# Patient Record
Sex: Male | Born: 1955
Health system: Southern US, Community
[De-identification: ages and names within clinical notes are randomized; demographics above are authoritative.]

## PROBLEM LIST (undated history)

## (undated) DIAGNOSIS — C801 Malignant (primary) neoplasm, unspecified: Secondary | ICD-10-CM

## (undated) DIAGNOSIS — H269 Unspecified cataract: Secondary | ICD-10-CM

## (undated) DIAGNOSIS — M810 Age-related osteoporosis without current pathological fracture: Secondary | ICD-10-CM

## (undated) DIAGNOSIS — K219 Gastro-esophageal reflux disease without esophagitis: Secondary | ICD-10-CM

## (undated) DIAGNOSIS — E785 Hyperlipidemia, unspecified: Secondary | ICD-10-CM

## (undated) DIAGNOSIS — E1165 Type 2 diabetes mellitus with hyperglycemia: Secondary | ICD-10-CM

## (undated) DIAGNOSIS — G709 Myoneural disorder, unspecified: Secondary | ICD-10-CM

## (undated) DIAGNOSIS — Z8711 Personal history of peptic ulcer disease: Secondary | ICD-10-CM

## (undated) DIAGNOSIS — I509 Heart failure, unspecified: Secondary | ICD-10-CM

## (undated) DIAGNOSIS — IMO0002 Reserved for concepts with insufficient information to code with codable children: Secondary | ICD-10-CM

## (undated) DIAGNOSIS — I10 Essential (primary) hypertension: Secondary | ICD-10-CM

## (undated) DIAGNOSIS — Z8719 Personal history of other diseases of the digestive system: Secondary | ICD-10-CM

## (undated) DIAGNOSIS — M1990 Unspecified osteoarthritis, unspecified site: Secondary | ICD-10-CM

## (undated) DIAGNOSIS — C439 Malignant melanoma of skin, unspecified: Secondary | ICD-10-CM

## (undated) DIAGNOSIS — E1140 Type 2 diabetes mellitus with diabetic neuropathy, unspecified: Secondary | ICD-10-CM

## (undated) DIAGNOSIS — T7840XA Allergy, unspecified, initial encounter: Secondary | ICD-10-CM

## (undated) DIAGNOSIS — D649 Anemia, unspecified: Secondary | ICD-10-CM

## (undated) HISTORY — DX: Malignant melanoma of skin, unspecified: C43.9

## (undated) HISTORY — PX: TONSILLECTOMY: SUR1361

## (undated) HISTORY — DX: Unspecified osteoarthritis, unspecified site: M19.90

## (undated) HISTORY — PX: ABCESS DRAINAGE: SHX399

## (undated) HISTORY — DX: Allergy, unspecified, initial encounter: T78.40XA

## (undated) HISTORY — DX: Age-related osteoporosis without current pathological fracture: M81.0

## (undated) HISTORY — DX: Hyperlipidemia, unspecified: E78.5

## (undated) HISTORY — DX: Heart failure, unspecified: I50.9

## (undated) HISTORY — DX: Myoneural disorder, unspecified: G70.9

## (undated) HISTORY — PX: OTHER SURGICAL HISTORY: SHX169

## (undated) HISTORY — DX: Unspecified cataract: H26.9

## (undated) HISTORY — DX: Anemia, unspecified: D64.9

## (undated) HISTORY — PX: COLONOSCOPY: SHX174

## (undated) HISTORY — DX: Gastro-esophageal reflux disease without esophagitis: K21.9

---

## 2011-12-05 ENCOUNTER — Encounter (HOSPITAL_BASED_OUTPATIENT_CLINIC_OR_DEPARTMENT_OTHER): Payer: Self-pay | Admitting: *Deleted

## 2011-12-05 ENCOUNTER — Emergency Department (HOSPITAL_BASED_OUTPATIENT_CLINIC_OR_DEPARTMENT_OTHER)
Admission: EM | Admit: 2011-12-05 | Discharge: 2011-12-05 | Disposition: A | Discharge status: Other Acute Inpatient Hospital | Payer: Self-pay | Attending: Emergency Medicine | Admitting: Emergency Medicine

## 2011-12-05 DIAGNOSIS — K612 Anorectal abscess: Secondary | ICD-10-CM | POA: Insufficient documentation

## 2011-12-05 DIAGNOSIS — E1110 Type 2 diabetes mellitus with ketoacidosis without coma: Secondary | ICD-10-CM | POA: Insufficient documentation

## 2011-12-05 DIAGNOSIS — K610 Anal abscess: Secondary | ICD-10-CM

## 2011-12-05 DIAGNOSIS — R000 Tachycardia, unspecified: Secondary | ICD-10-CM | POA: Insufficient documentation

## 2011-12-05 HISTORY — DX: Essential (primary) hypertension: I10

## 2011-12-05 LAB — URINALYSIS, ROUTINE W REFLEX MICROSCOPIC
Glucose, UA: >1000 mg/dL — AB
Ketones, ur: >80 mg/dL — AB
Leukocytes, UA: NEGATIVE
Nitrite: NEGATIVE
Protein, ur: 30 mg/dL — AB
Urobilinogen, UA: 0.2 mg/dL (ref 0.0–1.0)

## 2011-12-05 LAB — BASIC METABOLIC PANEL
Calcium: 9.9 mg/dL (ref 8.4–10.5)
Creatinine, Ser: 0.8 mg/dL (ref 0.50–1.35)
GFR calc Af Amer: >90 mL/min (ref >90)
GFR calc non Af Amer: >90 mL/min (ref >90)
Sodium: 134 mEq/L — ABNORMAL LOW (ref 135–145)

## 2011-12-05 LAB — GLUCOSE, CAPILLARY: Glucose-Capillary: 348 mg/dL — ABNORMAL HIGH (ref 70–99)

## 2011-12-05 LAB — POCT I-STAT 3, VENOUS BLOOD GAS (G3P V)
Acid-base deficit: 14 mmol/L — ABNORMAL HIGH (ref 0.0–2.0)
O2 Saturation: 76 %
pCO2, Ven: 23.8 mmHg — ABNORMAL LOW (ref 45.0–50.0)
pO2, Ven: 45 mmHg (ref 30.0–45.0)

## 2011-12-05 LAB — CBC WITH DIFFERENTIAL/PLATELET
Basophils Absolute: 0 10*3/uL (ref 0.0–0.1)
Basophils Relative: 0 % (ref 0–1)
Lymphocytes Relative: 10 % — ABNORMAL LOW (ref 12–46)
MCHC: 36.5 g/dL — ABNORMAL HIGH (ref 30.0–36.0)
Monocytes Absolute: 0.9 10*3/uL (ref 0.1–1.0)
Neutro Abs: 6.9 10*3/uL (ref 1.7–7.7)
Platelets: 150 10*3/uL (ref 150–400)
RDW: 13.2 % (ref 11.5–15.5)
WBC: 8.7 10*3/uL (ref 4.0–10.5)

## 2011-12-05 LAB — URINE MICROSCOPIC-ADD ON

## 2011-12-05 LAB — KETONES, QUALITATIVE

## 2011-12-05 MED ORDER — MORPHINE SULFATE 4 MG/ML IJ SOLN
4.0000 mg | Freq: Once | INTRAMUSCULAR | Status: AC
  Administered 2011-12-05: 4 mg via INTRAVENOUS
  Filled 2011-12-05: qty 1

## 2011-12-05 MED ORDER — SODIUM CHLORIDE 0.9 % IV SOLN: 1000.0000 mL | INTRAVENOUS | Status: DC

## 2011-12-05 MED ORDER — SODIUM CHLORIDE 0.9 % IV BOLUS (SEPSIS): 1000.0000 mL | Freq: Once | INTRAVENOUS | Status: DC

## 2011-12-05 MED ORDER — SODIUM CHLORIDE 0.9 % IV SOLN
Freq: Once | INTRAVENOUS | Status: AC
  Administered 2011-12-05: 20:00:00 via INTRAVENOUS

## 2011-12-05 MED ORDER — DEXTROSE 50 % IV SOLN: 25.0000 mL | INTRAVENOUS | Status: DC | PRN

## 2011-12-05 MED ORDER — INSULIN REGULAR HUMAN 100 UNIT/ML IJ SOLN
INTRAMUSCULAR | Status: AC
  Filled 2011-12-05: qty 1

## 2011-12-05 MED ORDER — SODIUM CHLORIDE 0.9 % IV SOLN: INTRAVENOUS | Status: DC

## 2011-12-05 MED ORDER — DEXTROSE 5 % IV SOLN: INTRAVENOUS | Status: DC

## 2011-12-05 MED ORDER — DEXTROSE-NACL 5-0.45 % IV SOLN
INTRAVENOUS | Status: DC
  Administered 2011-12-05: 20:00:00 via INTRAVENOUS

## 2011-12-05 MED ORDER — ONDANSETRON HCL 4 MG/2ML IJ SOLN
4.0000 mg | Freq: Once | INTRAMUSCULAR | Status: AC
  Administered 2011-12-05: 4 mg via INTRAVENOUS
  Filled 2011-12-05: qty 2

## 2011-12-05 MED ORDER — ONDANSETRON HCL 4 MG/2ML IJ SOLN
4.0000 mg | Freq: Once | INTRAMUSCULAR | Status: AC
  Administered 2011-12-05: 4 mg via INTRAVENOUS

## 2011-12-05 MED ORDER — INSULIN REGULAR BOLUS VIA INFUSION
0.0000 [IU] | Freq: Three times a day (TID) | INTRAVENOUS | Status: DC
  Filled 2011-12-05: qty 10

## 2011-12-05 MED ORDER — MORPHINE SULFATE 4 MG/ML IJ SOLN: 4.0000 mg | Freq: Once | INTRAMUSCULAR | Status: DC

## 2011-12-05 MED ORDER — SODIUM CHLORIDE 0.9 % IV BOLUS (SEPSIS)
1000.0000 mL | Freq: Once | INTRAVENOUS | Status: AC
  Administered 2011-12-05: 1000 mL via INTRAVENOUS

## 2011-12-05 NOTE — ED Notes (Signed)
Report called to [facility] with Lorin Picket, RN

## 2011-12-05 NOTE — ED Notes (Signed)
Rate in Insulin drip changed from 2.4 u/hr to 1.6 u/hr

## 2011-12-05 NOTE — ED Provider Notes (Signed)
History     CSN: [id number]  Arrival date & time 12/05/11  1528   First MD Initiated Contact with Patient 12/05/11 1538      Chief Complaint  Patient presents with  . Abscess    (Consider location/radiation/quality/duration/timing/severity/associated sxs/prior treatment) HPI Patient is a 56 year old male with history of type 2 diabetes who presents today complaining of 10 out of 10 rectal pain. Patient felt that this is hemorrhoids as he has a history of this in the past. Patient noted pain 2 days ago. He tried suppositories at home for this without relief. He's had increasing worsening and swelling in the rectal area. He denies any blood in his stools, abdominal pain, vomiting, or fevers. Patient does not check his blood sugars and notes that he has not taken his metformin for 6 months since the death of his parents. Patient denies any chest pain, cough, or shortness of breath. He was noted to be hypertensive with blood pressure 183/100 on presentation. Patient has not been treated for blood pressure in the past. He was tachycardic as well but afebrile. Patient reported pain was worse with sitting and better with lying flat on his stomach. He tried over-the-counter medications without relief. Patient reported that he has had one area drained in the past but this was not the location where he has pain today. He had obvious fluctuant area noted at the 12:00 position with patient in prone position. Family. There is no significant erythema. There no other associated or modifying factors. Past Medical History  Diagnosis Date  . Hypertension     History reviewed. No pertinent past surgical history.  History reviewed. No pertinent family history.  History  Substance Use Topics  . Smoking status: Never Smoker   . Smokeless tobacco: Not on file  . Alcohol Use: No      Review of Systems  Constitutional: Negative.   HENT: Negative.   Eyes: Negative.   Respiratory: Negative.     Cardiovascular: Negative.   Gastrointestinal: Positive for nausea and rectal pain. Negative for blood in stool.  Genitourinary: Positive for decreased urine volume.  Musculoskeletal: Negative.   Skin: Negative.   Neurological: Negative.   Hematological: Negative.   Psychiatric/Behavioral: Negative.   All other systems reviewed and are negative.    Allergies  Review of patient's allergies indicates no known allergies.  Home Medications   Current Outpatient Rx  Name Route Sig Dispense Refill  . IBUPROFEN 200 MG PO TABS Oral Take 600 mg by mouth every 6 (six) hours as needed. For pain.    Marland Kitchen NAPROXEN SODIUM 220 MG PO TABS Oral Take 220 mg by mouth 2 (two) times daily with a meal. For pain.    Marland Kitchen RANITIDINE HCL 150 MG PO TABS Oral Take 150 mg by mouth 2 (two) times daily.      BP 183/100  Pulse 110  Temp 98.6 F (37 C) (Oral)  Resp 16  Ht 5\' 11"  (1.803 m)  Wt 185 lb (83.915 kg)  BMI 25.80 kg/m2  SpO2 100%  Physical Exam  Nursing note and vitals reviewed. GEN: Well-developed, well-nourished male in no distress, very uncomfortable, smells of ketones HEENT: Atraumatic, normocephalic. Oropharynx clear without erythema EYES: PERRLA BL, no scleral icterus. NECK: Trachea midline, no meningismus CV: Tachycardic with regular rhythm. No murmurs, rubs, or gallops PULM: No respiratory distress.  No crackles, wheezes, or rales. GI: soft, non-tender. No guarding, rebound, or tenderness. + bowel sounds. Rectal exam with patient in the prone position shows  area that is approximately 1 inch in diameter that is fluctuant and tender to the touch. There is no overlying erythema or extension to the perineal region.  GU: deferred Neuro: cranial nerves grossly 2-12 intact, no abnormalities of strength or sensation, A and O x 3 MSK: Patient moves all 4 extremities symmetrically, no deformity, edema, or injury noted Skin: No rashes petechiae, purpura, or jaundice Psych: no abnormality of  mood   ED Course  Procedures (including critical care time)  Labs Reviewed  URINALYSIS, ROUTINE W REFLEX MICROSCOPIC - Abnormal; Notable for the following:    Specific Gravity, Urine 1.039 (*)     Glucose, UA >1000 (*)     Bilirubin Urine SMALL (*)     Ketones, ur >80 (*)     Protein, ur 30 (*)     All other components within normal limits  BASIC METABOLIC PANEL - Abnormal; Notable for the following:    Sodium 134 (*)     CO2 11 (*)     Glucose, Bld 347 (*)     All other components within normal limits  KETONES, QUALITATIVE - Abnormal; Notable for the following:    Acetone, Bld MODERATE (*)     All other components within normal limits  CBC WITH DIFFERENTIAL - Abnormal; Notable for the following:    HCT 38.4 (*)     MCHC 36.5 (*)     Neutrophils Relative 79 (*)     Lymphocytes Relative 10 (*)     All other components within normal limits  GLUCOSE, CAPILLARY - Abnormal; Notable for the following:    Glucose-Capillary 348 (*)     All other components within normal limits  POCT I-STAT 3, BLOOD GAS (G3P V) - Abnormal; Notable for the following:    pCO2, Ven 23.8 (*)     Bicarbonate 11.2 (*)     Acid-base deficit 14.0 (*)     All other components within normal limits  GLUCOSE, CAPILLARY - Abnormal; Notable for the following:    Glucose-Capillary 215 (*)     All other components within normal limits  URINE MICROSCOPIC-ADD ON  BLOOD GAS, VENOUS   No results found.   1. DKA (diabetic ketoacidoses)   2. Perianal abscess       MDM   Patient was evaluated by myself. Based on Aroma of ketones I had concern for patient being hyperglycemic. At this point is that the patient admitted to not taking his medicines for several months. He did also have obvious signs of infection with abscess. CBG was noted to be 348. Patient had serum ketones are positive as well as a gap of 27. A VBG showed a pH of 7.279 with a bicarbonate of 11.2 and a base deficit of 14. Patient received IV fluids  and was started on glucose stabilizer. Urinalysis showed ketones but no signs of infection. Patient was treated for his pain with 2 doses of morphine. Given location of patient's abscess I did feel that drainage by surgery was appropriate. Patient did not have overlying erythema that would necessitate antibiotics for cellulitis. Patient's white count was 8.7 here. Patient was accepted for admission by [facility] regional. Patient has no primary care physician but preferred admission to this facility given that he lives at [place]. Patient was transferred in good condition.  CRITICAL CARE Performed by: Cyndra Numbers   Total critical care time: 45 minutes  Critical care time was exclusive of separately billable procedures and treating other patients.  Critical care  was necessary to treat or prevent imminent or life-threatening deterioration.  Critical care was time spent personally by me on the following activities: development of treatment plan with patient and/or surrogate as well as nursing, discussions with consultants, evaluation of patient's response to treatment, examination of patient, obtaining history from patient or surrogate, ordering and performing treatments and interventions, ordering and review of laboratory studies, ordering and review of radiographic studies, pulse oximetry and re-evaluation of patient's condition.         Cyndra Numbers, MD 12/05/11 1901

## 2011-12-05 NOTE — ED Notes (Signed)
Pt c/o Hemorid x 2 days

## 2015-08-15 ENCOUNTER — Observation Stay (HOSPITAL_BASED_OUTPATIENT_CLINIC_OR_DEPARTMENT_OTHER)
Admission: EM | Admit: 2015-08-15 | Discharge: 2015-08-16 | Disposition: A | Discharge status: Home / Self Care | Payer: PRIVATE HEALTH INSURANCE | Attending: Surgery | Admitting: Surgery

## 2015-08-15 ENCOUNTER — Emergency Department (HOSPITAL_COMMUNITY): Payer: PRIVATE HEALTH INSURANCE | Admitting: Anesthesiology

## 2015-08-15 ENCOUNTER — Emergency Department (HOSPITAL_BASED_OUTPATIENT_CLINIC_OR_DEPARTMENT_OTHER): Payer: PRIVATE HEALTH INSURANCE

## 2015-08-15 ENCOUNTER — Encounter (HOSPITAL_BASED_OUTPATIENT_CLINIC_OR_DEPARTMENT_OTHER): Payer: Self-pay | Admitting: *Deleted

## 2015-08-15 ENCOUNTER — Encounter (HOSPITAL_COMMUNITY)
Admission: EM | Disposition: A | Discharge status: Home / Self Care | Payer: Self-pay | Source: Home / Self Care | Attending: Emergency Medicine

## 2015-08-15 DIAGNOSIS — Z791 Long term (current) use of non-steroidal anti-inflammatories (NSAID): Secondary | ICD-10-CM | POA: Diagnosis not present

## 2015-08-15 DIAGNOSIS — Z8719 Personal history of other diseases of the digestive system: Secondary | ICD-10-CM | POA: Insufficient documentation

## 2015-08-15 DIAGNOSIS — Z79899 Other long term (current) drug therapy: Secondary | ICD-10-CM | POA: Diagnosis not present

## 2015-08-15 DIAGNOSIS — K259 Gastric ulcer, unspecified as acute or chronic, without hemorrhage or perforation: Secondary | ICD-10-CM | POA: Insufficient documentation

## 2015-08-15 DIAGNOSIS — R110 Nausea: Secondary | ICD-10-CM | POA: Insufficient documentation

## 2015-08-15 DIAGNOSIS — E119 Type 2 diabetes mellitus without complications: Secondary | ICD-10-CM | POA: Diagnosis not present

## 2015-08-15 DIAGNOSIS — I10 Essential (primary) hypertension: Secondary | ICD-10-CM | POA: Diagnosis not present

## 2015-08-15 DIAGNOSIS — Z7984 Long term (current) use of oral hypoglycemic drugs: Secondary | ICD-10-CM | POA: Insufficient documentation

## 2015-08-15 DIAGNOSIS — K4030 Unilateral inguinal hernia, with obstruction, without gangrene, not specified as recurrent: Principal | ICD-10-CM | POA: Insufficient documentation

## 2015-08-15 HISTORY — DX: Personal history of peptic ulcer disease: Z87.11

## 2015-08-15 HISTORY — DX: Personal history of other diseases of the digestive system: Z87.19

## 2015-08-15 HISTORY — PX: INGUINAL HERNIA REPAIR: SHX194

## 2015-08-15 LAB — CBC WITH DIFFERENTIAL/PLATELET
Basophils Absolute: 0 10*3/uL (ref 0.0–0.1)
Basophils Relative: 0 %
EOS ABS: 0.1 10*3/uL (ref 0.0–0.7)
EOS PCT: 1 %
HCT: 34.7 % — ABNORMAL LOW (ref 39.0–52.0)
Hemoglobin: 13 g/dL (ref 13.0–17.0)
LYMPHS ABS: 1.7 10*3/uL (ref 0.7–4.0)
Lymphocytes Relative: 32 %
MCH: 32.7 pg (ref 26.0–34.0)
MCHC: 37.5 g/dL — AB (ref 30.0–36.0)
MCV: 87.2 fL (ref 78.0–100.0)
MONO ABS: 0.4 10*3/uL (ref 0.1–1.0)
MONOS PCT: 8 %
Neutro Abs: 3.2 10*3/uL (ref 1.7–7.7)
Neutrophils Relative %: 59 %
PLATELETS: 147 10*3/uL — AB (ref 150–400)
RBC: 3.98 MIL/uL — ABNORMAL LOW (ref 4.22–5.81)
RDW: 12.4 % (ref 11.5–15.5)
WBC: 5.5 10*3/uL (ref 4.0–10.5)

## 2015-08-15 LAB — BASIC METABOLIC PANEL
Anion gap: 8 (ref 5–15)
BUN: 10 mg/dL (ref 6–20)
CHLORIDE: 101 mmol/L (ref 101–111)
CO2: 21 mmol/L — AB (ref 22–32)
CREATININE: 0.59 mg/dL — AB (ref 0.61–1.24)
Calcium: 9 mg/dL (ref 8.9–10.3)
GFR calc Af Amer: >60 mL/min (ref >60)
GFR calc non Af Amer: >60 mL/min (ref >60)
Glucose, Bld: 266 mg/dL — ABNORMAL HIGH (ref 65–99)
Potassium: 3.7 mmol/L (ref 3.5–5.1)
Sodium: 130 mmol/L — ABNORMAL LOW (ref 135–145)

## 2015-08-15 SURGERY — REPAIR, HERNIA, INGUINAL, INCARCERATED
Anesthesia: General | Site: Abdomen

## 2015-08-15 MED ORDER — CEFAZOLIN SODIUM-DEXTROSE 2-4 GM/100ML-% IV SOLN
INTRAVENOUS | Status: AC
Start: 2015-08-15 — End: 2015-08-15
  Filled 2015-08-15: qty 100

## 2015-08-15 MED ORDER — PROPOFOL 10 MG/ML IV BOLUS
INTRAVENOUS | Status: DC | PRN
  Administered 2015-08-15: 150 mg via INTRAVENOUS

## 2015-08-15 MED ORDER — HYDROMORPHONE HCL 1 MG/ML IJ SOLN
INTRAMUSCULAR | Status: DC | PRN
  Administered 2015-08-15 – 2015-08-16 (×3): 0.5 mg via INTRAVENOUS

## 2015-08-15 MED ORDER — MORPHINE SULFATE (PF) 4 MG/ML IV SOLN
4.0000 mg | Freq: Once | INTRAVENOUS | Status: AC
  Administered 2015-08-15: 4 mg via INTRAVENOUS

## 2015-08-15 MED ORDER — LIDOCAINE HCL (CARDIAC) 20 MG/ML IV SOLN
INTRAVENOUS | Status: DC | PRN
  Administered 2015-08-15: 100 mg via INTRAVENOUS

## 2015-08-15 MED ORDER — SODIUM CHLORIDE 0.9 % IV BOLUS (SEPSIS)
500.0000 mL | Freq: Once | INTRAVENOUS | Status: AC
  Administered 2015-08-15: 500 mL via INTRAVENOUS

## 2015-08-15 MED ORDER — HYDROMORPHONE HCL 2 MG/ML IJ SOLN
INTRAMUSCULAR | Status: AC
  Filled 2015-08-15: qty 1

## 2015-08-15 MED ORDER — LACTATED RINGERS IV SOLN
INTRAVENOUS | Status: DC | PRN
  Administered 2015-08-15 – 2015-08-16 (×2): via INTRAVENOUS

## 2015-08-15 MED ORDER — ONDANSETRON HCL 4 MG/2ML IJ SOLN
INTRAMUSCULAR | Status: AC
  Filled 2015-08-15: qty 2

## 2015-08-15 MED ORDER — SUCCINYLCHOLINE CHLORIDE 20 MG/ML IJ SOLN
INTRAMUSCULAR | Status: DC | PRN
  Administered 2015-08-15: 80 mg via INTRAVENOUS

## 2015-08-15 MED ORDER — PHENYLEPHRINE 40 MCG/ML (10ML) SYRINGE FOR IV PUSH (FOR BLOOD PRESSURE SUPPORT)
PREFILLED_SYRINGE | INTRAVENOUS | Status: AC
  Filled 2015-08-15: qty 10

## 2015-08-15 MED ORDER — LIDOCAINE HCL (CARDIAC) 20 MG/ML IV SOLN
INTRAVENOUS | Status: AC
  Filled 2015-08-15: qty 5

## 2015-08-15 MED ORDER — PROPOFOL 10 MG/ML IV BOLUS
INTRAVENOUS | Status: AC
  Filled 2015-08-15: qty 20

## 2015-08-15 MED ORDER — MORPHINE SULFATE (PF) 4 MG/ML IV SOLN
INTRAVENOUS | Status: AC
  Filled 2015-08-15: qty 1

## 2015-08-15 MED ORDER — BUPIVACAINE-EPINEPHRINE (PF) 0.25% -1:200000 IJ SOLN
INTRAMUSCULAR | Status: AC
  Filled 2015-08-15: qty 30

## 2015-08-15 MED ORDER — PHENYLEPHRINE HCL 10 MG/ML IJ SOLN
INTRAMUSCULAR | Status: DC | PRN
  Administered 2015-08-15 – 2015-08-16 (×4): 80 ug via INTRAVENOUS

## 2015-08-15 MED ORDER — FENTANYL CITRATE (PF) 100 MCG/2ML IJ SOLN
INTRAMUSCULAR | Status: DC | PRN
  Administered 2015-08-15 (×2): 50 ug via INTRAVENOUS

## 2015-08-15 MED ORDER — ROCURONIUM BROMIDE 100 MG/10ML IV SOLN
INTRAVENOUS | Status: DC | PRN
  Administered 2015-08-15: 20 mg via INTRAVENOUS

## 2015-08-15 MED ORDER — FENTANYL CITRATE (PF) 100 MCG/2ML IJ SOLN
INTRAMUSCULAR | Status: AC
  Filled 2015-08-15: qty 2

## 2015-08-15 MED ORDER — MORPHINE SULFATE (PF) 4 MG/ML IV SOLN
4.0000 mg | Freq: Once | INTRAVENOUS | Status: AC
  Administered 2015-08-15: 4 mg via INTRAVENOUS
  Filled 2015-08-15: qty 1

## 2015-08-15 MED ORDER — CEFAZOLIN SODIUM-DEXTROSE 2-3 GM-% IV SOLR
2.0000 g | Freq: Once | INTRAVENOUS | Status: AC
  Administered 2015-08-15: 2 g via INTRAVENOUS

## 2015-08-15 MED ORDER — MORPHINE SULFATE (PF) 4 MG/ML IV SOLN: 4.0000 mg | Freq: Once | INTRAVENOUS | Status: DC

## 2015-08-15 SURGICAL SUPPLY — 41 items
BENZOIN TINCTURE PRP APPL 2/3 (GAUZE/BANDAGES/DRESSINGS) ×2 IMPLANT
BLADE HEX COATED 2.75 (ELECTRODE) IMPLANT
BLADE SURG 15 STRL LF DISP TIS (BLADE) IMPLANT
BLADE SURG 15 STRL SS (BLADE)
BLADE SURG SZ10 CARB STEEL (BLADE) ×2 IMPLANT
COVER SURGICAL LIGHT HANDLE (MISCELLANEOUS) IMPLANT
DECANTER SPIKE VIAL GLASS SM (MISCELLANEOUS) ×2 IMPLANT
DISSECTOR ROUND CHERRY 3/8 STR (MISCELLANEOUS) IMPLANT
DRAIN PENROSE 18X1/2 LTX STRL (DRAIN) ×2 IMPLANT
DRAPE LAPAROTOMY TRNSV 102X78 (DRAPE) ×2 IMPLANT
ELECT PENCIL ROCKER SW 15FT (MISCELLANEOUS) ×2 IMPLANT
ELECT REM PT RETURN 9FT ADLT (ELECTROSURGICAL) ×2
ELECTRODE REM PT RTRN 9FT ADLT (ELECTROSURGICAL) ×1 IMPLANT
GAUZE SPONGE 4X4 12PLY STRL (GAUZE/BANDAGES/DRESSINGS) IMPLANT
GLOVE BIOGEL PI IND STRL 7.0 (GLOVE) ×1 IMPLANT
GLOVE BIOGEL PI INDICATOR 7.0 (GLOVE) ×1
GLOVE SURG SIGNA 7.5 PF LTX (GLOVE) ×2 IMPLANT
GOWN SPEC L4 XLG W/TWL (GOWN DISPOSABLE) ×2 IMPLANT
GOWN STRL REUS W/ TWL XL LVL3 (GOWN DISPOSABLE) ×3 IMPLANT
GOWN STRL REUS W/TWL LRG LVL3 (GOWN DISPOSABLE) ×2 IMPLANT
GOWN STRL REUS W/TWL XL LVL3 (GOWN DISPOSABLE) ×3
KIT BASIN OR (CUSTOM PROCEDURE TRAY) ×2 IMPLANT
LIQUID BAND (GAUZE/BANDAGES/DRESSINGS) ×2 IMPLANT
MESH ULTRAPRO 3X6 7.6X15CM (Mesh General) ×2 IMPLANT
NEEDLE HYPO 25X1 1.5 SAFETY (NEEDLE) IMPLANT
NS IRRIG 1000ML POUR BTL (IV SOLUTION) ×2 IMPLANT
PACK BASIC VI WITH GOWN DISP (CUSTOM PROCEDURE TRAY) ×2 IMPLANT
SPONGE LAP 18X18 X RAY DECT (DISPOSABLE) IMPLANT
SPONGE LAP 4X18 X RAY DECT (DISPOSABLE) IMPLANT
STRIP CLOSURE SKIN 1/2X4 (GAUZE/BANDAGES/DRESSINGS) IMPLANT
SUT CHROMIC 0 SH (SUTURE) IMPLANT
SUT MNCRL AB 4-0 PS2 18 (SUTURE) ×2 IMPLANT
SUT NOVA 0 T19/GS 22DT (SUTURE) ×8 IMPLANT
SUT VIC AB 0 CT2 27 (SUTURE) ×2 IMPLANT
SUT VIC AB 2-0 SH 27 (SUTURE) ×1
SUT VIC AB 2-0 SH 27X BRD (SUTURE) ×1 IMPLANT
SUT VIC AB 3-0 SH 18 (SUTURE) ×2 IMPLANT
SYR BULB IRRIGATION 50ML (SYRINGE) ×2 IMPLANT
SYR CONTROL 10ML LL (SYRINGE) ×2 IMPLANT
TOWEL OR 17X26 10 PK STRL BLUE (TOWEL DISPOSABLE) ×2 IMPLANT
YANKAUER SUCT BULB TIP 10FT TU (MISCELLANEOUS) IMPLANT

## 2015-08-15 NOTE — ED Notes (Signed)
Bed: [id number] Expected date:  Expected time:  Means of arrival:  Comments: Transfer from med center

## 2015-08-15 NOTE — ED Provider Notes (Signed)
MSE was initiated and I personally evaluated the patient and placed orders (if any) at  10:30 PM on Aug 15, 2015.  Pt arrives from [place] to be seen by Dr. Lucia Gaskins for an incarcerated hernia. Pt is resting in the bed, appears well in no acute distress, Heart sounds normal, patient states he is still in pain. Will give another 4mg  of morphine. Dr. Lucia Gaskins has been contacted.   The patient appears stable so that the remainder of the MSE may be completed by another provider.  Kalman Drape, [place] 08/15/15 2248  Virgel Manifold, MD 08/18/15 [phone]

## 2015-08-15 NOTE — ED Notes (Signed)
Pt arrived from [facility]

## 2015-08-15 NOTE — H&P (Signed)
Re:   Jesse Lane DOB:   Sep 24, 1955 MRN:   [id number]   Admission  ASSESSMENT AND PLAN: 1.  Incarcerated right inguinal hernia  I discussed the indications and complications of hernia surgery with the patient.  I discussed both the laparoscopic and open approach to hernia repair.  Because this is incarcerated, I do not think that he is a laparoscopic candidate.  The potential risks of hernia surgery include, but are not limited to, bleeding, infection, open surgery, nerve injury, and recurrence of the hernia.    To the OR tonight.  2.  Possible early left inguinal hernia  I discussed trying to repair this non-emergently.  2.  DM x 5 years  Glucose - 266 - 08/15/2015 3.  HTN x 20 plus years 4.  "Stomach ulcer" seen recently by Dr. Carlean Jews. [place], [place]  Chief Complaint  Patient presents with  . Groin Swelling   REFERRING PHYSICIAN: Annetta Maw, MD  HISTORY OF PRESENT ILLNESS: Jesse Lane is a 60 y.o. (DOB: 29-May-1955)  white  male whose primary care physician is Annetta Maw, MD and comes to [place] today for groin pain. His girl friend, Tula Nakayama, is with him.  He has had a known right inguinal hernia for 3 years, but because of finances, has not had this repaired. He was recently set to have inguinal hernia surgery by Dr. Lonia Skinner (urology) in [facility], but apparently his insurance would not cover the surgery.  So he is aware of hernia surgery, its risks, and potential complications.  He had a recent colonoscopy and upper endoscopy by Dr. Carlean Jews. Ferdinand Lango in [place].  He was found to have a "stomach ulcer".  He is on carafate and Protonix for this.   Past Medical History  Diagnosis Date  . Hypertension   . Diabetes mellitus   . History of stomach ulcers       Past Surgical History  Procedure Laterality Date  . Pyloric stenosis    . Tonsillectomy    . Colonoscopy        No current facility-administered medications for this  encounter.   Current Outpatient Prescriptions  Medication Sig Dispense Refill  . gabapentin (NEURONTIN) 300 MG capsule Take 300 mg by mouth 3 (three) times daily.    . metFORMIN (GLUCOPHAGE) 1000 MG tablet Take 1,000 mg by mouth 2 (two) times daily with a meal.    . omeprazole (PRILOSEC) 40 MG capsule Take 40 mg by mouth daily.    . ranitidine (ZANTAC) 150 MG tablet Take 150 mg by mouth 2 (two) times daily.    . sucralfate (CARAFATE) 1 g tablet Take 1 g by mouth 4 (four) times daily -  with meals and at bedtime.    Marland Kitchen ibuprofen (ADVIL,MOTRIN) 200 MG tablet Take 600 mg by mouth every 6 (six) hours as needed. For pain.    . naproxen sodium (ANAPROX) 220 MG tablet Take 220 mg by mouth 2 (two) times daily with a meal. For pain.       No Known Allergies  REVIEW OF SYSTEMS: Skin:  No history of rash.  No history of abnormal moles. Infection:  No history of hepatitis or HIV.  No history of MRSA. Neurologic:  No history of stroke.  No history of seizure.  No history of headaches. Cardiac:  HTN.  No history of heart disease.  No history of prior cardiac catheterization.   Pulmonary:  Does not smoke cigarettes.  No asthma or bronchitis.  No OSA/CPAP.  Endocrine:  DM x 5 years. No thyroid disease. Gastrointestinal:  History of stomach ulcer.  Had pyloroplasty as an infant.  No history of liver disease.  No history of gall bladder disease.  No history of pancreas disease.  Recent colonoscopy. Urologic:  No history of kidney stones.  No history of bladder infections. Musculoskeletal:  No history of joint or back disease. Hematologic:  No bleeding disorder.  No history of anemia.  Not anticoagulated. Psycho-social:  The patient is oriented.   The patient has no obvious psychologic or social impairment to understanding our conversation and plan.  SOCIAL and FAMILY HISTORY:  Unmarried. Girlfriend, Tula Nakayama, is with him. He has one daughter, 65 yo. He works in Radiation protection practitioner for himself.  PHYSICAL  EXAM: BP 203/110 mmHg  Pulse 72  Temp(Src) 98.9 F (37.2 C) (Oral)  Resp 20  Ht 5\' 11"  (1.803 m)  Wt 63.504 kg (140 lb)  BMI 19.53 kg/m2  SpO2 98%  General: WN thin WM who is alert. HEENT: Normal. Pupils equal. Neck: Supple. No mass.  No thyroid mass. Lymph Nodes:  No supraclavicular or cervical nodes. Lungs: Clear to auscultation and symmetric breath sounds. Heart:  RRR. No murmur or rub. Abdomen: Soft. Incarcerated right inguinal hernia, which is not reducible. Normal bowel sounds.  RUQ scar of pyloromyotomy  Extremities:  Good strength and ROM  in upper and lower extremities. Neurologic:  Grossly intact to motor and sensory function. Psychiatric: Has normal mood and affect. Behavior is normal.   DATA REVIEWED: Epic notes.  Alphonsa Overall, MD,  [facility], [address]  [place], [place]    [place] Phone:  [phone] FAX:  [phone]

## 2015-08-15 NOTE — ED Provider Notes (Signed)
CSN: [id number]     Arrival date & time 08/15/15  1824 History  By signing my name below, I, Doran Stabler, attest that this documentation has been prepared under the direction and in the presence of Veryl Speak, MD. Electronically Signed: Doran Stabler, ED Scribe. 08/15/2015. 7:39 PM.   Chief Complaint  Patient presents with  . Groin Swelling   The history is provided by the patient. No language interpreter was used.   HPI Comments: Jesse Lane is a 60 y.o. male who presents to the Emergency Department with a PMHx of HTN and DM complaining of throbbing groin pain that began 7 hours ago, today. Pt states he has a right inguinal hernia that is usually reduced when he lies down, however has not since earlier today. Pt denies any fevers, chills, vomiting, or any other symptoms at this time.   Pt has an appointment with Dr. Luvenia Starch, PCP tomorrow.  Pt has seen surgeon, Dr. Venia Minks at [facility] for this ongoing concern however, pt is not comfortable seeing him again.   Past Medical History  Diagnosis Date  . Hypertension   . Diabetes mellitus   . History of stomach ulcers    Past Surgical History  Procedure Laterality Date  . Pyloric stenosis    . Tonsillectomy    . Colonoscopy     No family history on file. Social History  Substance Use Topics  . Smoking status: Never Smoker   . Smokeless tobacco: None  . Alcohol Use: 0.6 oz/week    1 Glasses of wine per week     Comment: daily    Review of Systems  All other systems reviewed and are negative.  A complete 10 system review of systems was obtained and all systems are negative except as noted in the HPI and PMH.   Allergies  Review of patient's allergies indicates no known allergies.  Home Medications   Prior to Admission medications   Medication Sig Start Date End Date Taking? Authorizing Provider  gabapentin (NEURONTIN) 300 MG capsule Take 300 mg by mouth 3 (three) times daily.   Yes Historical Provider, MD   metFORMIN (GLUCOPHAGE) 1000 MG tablet Take 1,000 mg by mouth 2 (two) times daily with a meal.   Yes Historical Provider, MD  omeprazole (PRILOSEC) 40 MG capsule Take 40 mg by mouth daily.   Yes Historical Provider, MD  ranitidine (ZANTAC) 150 MG tablet Take 150 mg by mouth 2 (two) times daily.   Yes Historical Provider, MD  sucralfate (CARAFATE) 1 g tablet Take 1 g by mouth 4 (four) times daily -  with meals and at bedtime.   Yes Historical Provider, MD  ibuprofen (ADVIL,MOTRIN) 200 MG tablet Take 600 mg by mouth every 6 (six) hours as needed. For pain.    Historical Provider, MD  naproxen sodium (ANAPROX) 220 MG tablet Take 220 mg by mouth 2 (two) times daily with a meal. For pain.    Historical Provider, MD   BP 179/104 mmHg  Pulse 88  Temp(Src) 98.3 F (36.8 C) (Oral)  Resp 20  Ht 5\' 11"  (1.803 m)  Wt 140 lb (63.504 kg)  BMI 19.53 kg/m2  SpO2 100%   Physical Exam  Constitutional: He is oriented to person, place, and time. He appears well-developed and well-nourished. No distress.  HENT:  Head: Normocephalic and atraumatic.  Mouth/Throat: Oropharynx is clear and moist. No oropharyngeal exudate.  Eyes: Conjunctivae and EOM are normal. Pupils are equal, round, and reactive to light.  Neck: Normal range of motion. Neck supple.  No meningismus.  Cardiovascular: Normal rate, regular rhythm, normal heart sounds and intact distal pulses.   No murmur heard. Pulmonary/Chest: Effort normal and breath sounds normal. No respiratory distress.  Abdominal: Soft. There is no tenderness. There is no rebound and no guarding. A hernia is present. Hernia confirmed positive in the right inguinal area.  Genitourinary:  Large right inguinal hernia which is non-reducible and exquisitely TTP  Musculoskeletal: Normal range of motion. He exhibits no edema or tenderness.  Neurological: He is alert and oriented to person, place, and time. No cranial nerve deficit. He exhibits normal muscle tone. Coordination  normal.  No ataxia on finger to nose bilaterally. No pronator drift. 5/5 strength throughout. CN 2-12 intact.Equal grip strength. Sensation intact.   Skin: Skin is warm.  Psychiatric: He has a normal mood and affect. His behavior is normal.  Nursing note and vitals reviewed.   ED Course  Procedures  DIAGNOSTIC STUDIES: Oxygen Saturation is 100% on room air, normal by my interpretation.    COORDINATION OF CARE: 7:33 PM Will give fluids and pain medication. Will order x-ray of abdomen and blood work. Discussed treatment plan with pt at bedside and pt agreed to plan.  Labs Review Labs Reviewed - No data to display  Imaging Review No results found. I have personally reviewed and evaluated these images and lab results as part of my medical decision-making.   EKG Interpretation None      MDM   Final diagnoses:  None    This appears to be an incarcerated inguinal hernia. I've spoken with Dr. Lucia Gaskins from general surgery who agrees to accept the patient in transfer. He will be transferred to [facility] long for further evaluation.  I personally performed the services described in this documentation, which was scribed in my presence. The recorded information has been reviewed and is accurate.       Veryl Speak, MD 08/31/15 1537

## 2015-08-15 NOTE — ED Notes (Signed)
CHG bath complete; Wife at bedside; pt has no belongings; pt states that wife took belongings at [employer]; OR called and ready for pt

## 2015-08-15 NOTE — ED Notes (Signed)
Consent signed.

## 2015-08-15 NOTE — ED Notes (Signed)
Patient transferred via [facility] to [facility] ER

## 2015-08-15 NOTE — ED Notes (Signed)
Patient has a right inguinal hernia and is currently working with his doctor to schedule a surgery.  Today, around 1300, he developed swelling and tightness in the hernia and it will not reduce.

## 2015-08-15 NOTE — ED Notes (Signed)
Dr. Newman at bedside. 

## 2015-08-15 NOTE — Anesthesia Procedure Notes (Signed)
Procedure Name: Intubation Date/Time: 08/15/2015 11:31 PM Performed by: Danley Danker L Patient Re-evaluated:Patient Re-evaluated prior to inductionOxygen Delivery Method: Circle system utilized Preoxygenation: Pre-oxygenation with 100% oxygen Intubation Type: IV induction, Rapid sequence and Cricoid Pressure applied Laryngoscope Size: Miller and 2 Grade View: Grade I Tube type: Oral Tube size: 7.5 mm Number of attempts: 1 Airway Equipment and Method: Stylet Placement Confirmation: ETT inserted through vocal cords under direct vision,  breath sounds checked- equal and bilateral and positive ETCO2 Secured at: 21 cm Tube secured with: Tape Dental Injury: Teeth and Oropharynx as per pre-operative assessment

## 2015-08-15 NOTE — Anesthesia Preprocedure Evaluation (Addendum)
Anesthesia Evaluation  Patient identified by MRN, date of birth, ID band Patient awake    Reviewed: Allergy & Precautions, NPO status , Patient's Chart, lab work & pertinent test results  Airway Mallampati: II  TM Distance: >3 FB Neck ROM: Full    Dental no notable dental hx.    Pulmonary neg pulmonary ROS,    Pulmonary exam normal breath sounds clear to auscultation       Cardiovascular hypertension, Normal cardiovascular exam Rhythm:Regular Rate:Normal  Untreated htn   Neuro/Psych negative neurological ROS  negative psych ROS   GI/Hepatic negative GI ROS, Neg liver ROS,   Endo/Other  diabetes  Renal/GU negative Renal ROS  negative genitourinary   Musculoskeletal negative musculoskeletal ROS (+)   Abdominal   Peds negative pediatric ROS (+)  Hematology negative hematology ROS (+)   Anesthesia Other Findings   Reproductive/Obstetrics negative OB ROS                            Anesthesia Physical Anesthesia Plan  ASA: III  Anesthesia Plan: General   Post-op Pain Management:    Induction: Intravenous and Rapid sequence  Airway Management Planned: Oral ETT  Additional Equipment:   Intra-op Plan:   Post-operative Plan: Extubation in OR  Informed Consent: I have reviewed the patients History and Physical, chart, labs and discussed the procedure including the risks, benefits and alternatives for the proposed anesthesia with the patient or authorized representative who has indicated his/her understanding and acceptance.   Dental advisory given  Plan Discussed with: CRNA and Surgeon  Anesthesia Plan Comments:        Anesthesia Quick Evaluation

## 2015-08-16 ENCOUNTER — Other Ambulatory Visit: Payer: Self-pay | Admitting: Surgery

## 2015-08-16 ENCOUNTER — Encounter (HOSPITAL_COMMUNITY): Payer: Self-pay | Admitting: Surgery

## 2015-08-16 ENCOUNTER — Ambulatory Visit: Payer: Self-pay | Admitting: General Surgery

## 2015-08-16 LAB — GLUCOSE, CAPILLARY
GLUCOSE-CAPILLARY: 223 mg/dL — AB (ref 65–99)
GLUCOSE-CAPILLARY: 251 mg/dL — AB (ref 65–99)
Glucose-Capillary: 216 mg/dL — ABNORMAL HIGH (ref 65–99)
Glucose-Capillary: 219 mg/dL — ABNORMAL HIGH (ref 65–99)

## 2015-08-16 MED ORDER — SUGAMMADEX SODIUM 200 MG/2ML IV SOLN
INTRAVENOUS | Status: AC
  Filled 2015-08-16: qty 2

## 2015-08-16 MED ORDER — ONDANSETRON HCL 4 MG/2ML IJ SOLN
4.0000 mg | Freq: Four times a day (QID) | INTRAMUSCULAR | Status: DC | PRN
  Administered 2015-08-16: 4 mg via INTRAVENOUS
  Filled 2015-08-16: qty 2

## 2015-08-16 MED ORDER — HEPARIN SODIUM (PORCINE) 5000 UNIT/ML IJ SOLN
5000.0000 [IU] | Freq: Three times a day (TID) | INTRAMUSCULAR | Status: DC
  Administered 2015-08-16: 5000 [IU] via SUBCUTANEOUS
  Filled 2015-08-16 (×4): qty 1

## 2015-08-16 MED ORDER — MORPHINE SULFATE (PF) 2 MG/ML IV SOLN: 1.0000 mg | INTRAVENOUS | Status: DC | PRN

## 2015-08-16 MED ORDER — ONDANSETRON HCL 4 MG/2ML IJ SOLN
INTRAMUSCULAR | Status: DC | PRN
  Administered 2015-08-16: 4 mg via INTRAVENOUS

## 2015-08-16 MED ORDER — SUGAMMADEX SODIUM 200 MG/2ML IV SOLN
INTRAVENOUS | Status: DC | PRN
  Administered 2015-08-16: 120 mg via INTRAVENOUS

## 2015-08-16 MED ORDER — HYDROCODONE-ACETAMINOPHEN 5-325 MG PO TABS: 1.0000 | ORAL_TABLET | ORAL | Status: DC | PRN

## 2015-08-16 MED ORDER — SUCRALFATE 1 G PO TABS
1.0000 g | ORAL_TABLET | Freq: Three times a day (TID) | ORAL | Status: DC
  Administered 2015-08-16 (×2): 1 g via ORAL
  Filled 2015-08-16 (×5): qty 1

## 2015-08-16 MED ORDER — INSULIN ASPART 100 UNIT/ML ~~LOC~~ SOLN
SUBCUTANEOUS | Status: AC
  Filled 2015-08-16: qty 1

## 2015-08-16 MED ORDER — HYDROMORPHONE HCL 1 MG/ML IJ SOLN: 0.2500 mg | INTRAMUSCULAR | Status: DC | PRN

## 2015-08-16 MED ORDER — GABAPENTIN 300 MG PO CAPS
300.0000 mg | ORAL_CAPSULE | Freq: Three times a day (TID) | ORAL | Status: DC | PRN
  Filled 2015-08-16: qty 1

## 2015-08-16 MED ORDER — PANTOPRAZOLE SODIUM 40 MG PO TBEC: 40.0000 mg | DELAYED_RELEASE_TABLET | Freq: Two times a day (BID) | ORAL | Status: DC

## 2015-08-16 MED ORDER — IBUPROFEN 200 MG PO TABS: 600.0000 mg | ORAL_TABLET | Freq: Four times a day (QID) | ORAL | Status: DC | PRN

## 2015-08-16 MED ORDER — METFORMIN HCL 500 MG PO TABS
1000.0000 mg | ORAL_TABLET | Freq: Two times a day (BID) | ORAL | Status: DC
  Filled 2015-08-16 (×2): qty 2

## 2015-08-16 MED ORDER — KCL IN DEXTROSE-NACL 20-5-0.45 MEQ/L-%-% IV SOLN
INTRAVENOUS | Status: DC
Start: 2015-08-16 — End: 2015-08-16
  Administered 2015-08-16: 03:00:00 via INTRAVENOUS
  Filled 2015-08-16 (×2): qty 1000

## 2015-08-16 MED ORDER — 0.9 % SODIUM CHLORIDE (POUR BTL) OPTIME
TOPICAL | Status: DC | PRN
  Administered 2015-08-16: 1000 mL

## 2015-08-16 MED ORDER — HYDROCODONE-ACETAMINOPHEN 5-325 MG PO TABS
1.0000 | ORAL_TABLET | ORAL | Status: DC | PRN
  Administered 2015-08-16 (×3): 2 via ORAL
  Filled 2015-08-16 (×3): qty 2

## 2015-08-16 MED ORDER — PHENOL 1.4 % MT LIQD
1.0000 | OROMUCOSAL | Status: DC | PRN
  Administered 2015-08-16: 1 via OROMUCOSAL
  Filled 2015-08-16: qty 177

## 2015-08-16 MED ORDER — BUPIVACAINE-EPINEPHRINE 0.25% -1:200000 IJ SOLN
INTRAMUSCULAR | Status: DC | PRN
  Administered 2015-08-16: 20 mL

## 2015-08-16 MED ORDER — KETOROLAC TROMETHAMINE 30 MG/ML IJ SOLN: 30.0000 mg | Freq: Once | INTRAMUSCULAR | Status: DC | PRN

## 2015-08-16 MED ORDER — INSULIN ASPART 100 UNIT/ML ~~LOC~~ SOLN
0.0000 [IU] | Freq: Three times a day (TID) | SUBCUTANEOUS | Status: DC
  Administered 2015-08-16: 8 [IU] via SUBCUTANEOUS
  Administered 2015-08-16: 5 [IU] via SUBCUTANEOUS
  Administered 2015-08-16: 3 [IU] via SUBCUTANEOUS

## 2015-08-16 MED ORDER — ONDANSETRON 4 MG PO TBDP: 4.0000 mg | ORAL_TABLET | Freq: Four times a day (QID) | ORAL | Status: DC | PRN

## 2015-08-16 NOTE — Discharge Instructions (Signed)
[facility] _______[facility]  UMBILICAL OR INGUINAL HERNIA REPAIR: POST OP INSTRUCTIONS  Always review your discharge instruction sheet given to you by the facility where your surgery was performed. IF YOU HAVE DISABILITY OR FAMILY LEAVE FORMS, YOU MUST BRING THEM TO THE OFFICE FOR PROCESSING.   DO NOT GIVE THEM TO YOUR DOCTOR.  1. A  prescription for pain medication may be given to you upon discharge.  Take your pain medication as prescribed, if needed.  If narcotic pain medicine is not needed, then you may take acetaminophen (Tylenol) or ibuprofen (Advil) as needed. 2. Take your usually prescribed medications unless otherwise directed. 3. If you need a refill on your pain medication, please contact your pharmacy.  They will contact our office to request authorization. Prescriptions will not be filled after 5 pm or on week-ends. 4. You should follow a light diet the first 24 hours after arrival home, such as soup and crackers, etc.  Be sure to include lots of fluids daily.  Resume your normal diet the day after surgery. 5. Most patients will experience some swelling and bruising around the umbilicus or in the groin and scrotum.  Ice packs and reclining will help.  Swelling and bruising can take several days to resolve.  6. It is common to experience some constipation if taking pain medication after surgery.  Increasing fluid intake and taking a stool softener (such as Colace) will usually help or prevent this problem from occurring.  A mild laxative (Milk of Magnesia or Miralax) should be taken according to package directions if there are no bowel movements after 48 hours. 7. Unless discharge instructions indicate otherwise, you may remove your bandages 24-48 hours after surgery, and you may shower at that time.  You may have steri-strips (small skin tapes) in place directly over the incision.  These strips should be left on the skin for 7-10 days.  If your surgeon used skin glue on the  incision, you may shower in 24 hours.  The glue will flake off over the next 2-3 weeks.  Any sutures or staples will be removed at the office during your follow-up visit. 8. ACTIVITIES:  You may resume regular (light) daily activities beginning the next day--such as daily self-care, walking, climbing stairs--gradually increasing activities as tolerated.  You may have sexual intercourse when it is comfortable.  Refrain from any heavy lifting or straining until approved by your doctor. a. You may drive when you are no longer taking prescription pain medication, you can comfortably wear a seatbelt, and you can safely maneuver your car and apply brakes. b. RETURN TO WORK:  __________________________________________________________ 9. You should see your doctor in the office for a follow-up appointment approximately 2-3 weeks after your surgery.  Make sure that you call for this appointment within a day or two after you arrive home to insure a convenient appointment time. 10. OTHER INSTRUCTIONS:  __________________________________________________________________________________________________________________________________________________________________________________________  WHEN TO CALL YOUR DOCTOR: 1. Fever over 101.0 2. Inability to urinate 3. Nausea and/or vomiting 4. Extreme swelling or bruising 5. Continued bleeding from incision. 6. Increased pain, redness, or drainage from the incision  The clinic staff is available to answer your questions during regular business hours.  Please don't hesitate to call and ask to speak to one of the nurses for clinical concerns.  If you have a medical emergency, go to the nearest emergency room or call 911.  A surgeon from [facility] is always on call at the hospital     [address], [address], [place], [place]  [address] ?  [address] [address], [place], [place]   [address] [phone] ? [phone] ? FAX [phone] Web site:  [email or web address]  

## 2015-08-16 NOTE — Op Note (Signed)
08/15/2015 - 08/16/2015  1:10 AM  PATIENT:  Jesse Lane, 60 y.o., male, MRN: [id number]  PREOP DIAGNOSIS:  incarcerated right inguinal hernia  POSTOP DIAGNOSIS:   incarcerated right inguinal hernia, indirect  PROCEDURE:   Procedure(s):  Right HERNIA REPAIR INGUINAL, INCARCERATED  SURGEON:   Alphonsa Overall, M.D.  ANESTHESIA:   general  Anesthesiologist: Myrtie Soman, MD CRNA: Sharlette Dense, CRNA  General  EBL:  < 75  ml  LOCAL MEDICATIONS USED:   30 cc 1/4% marcaine  SPECIMEN:   Hernia sac  COUNTS CORRECT:  YES  INDICATIONS FOR PROCEDURE:  [name] is a 60 y.o. (DOB: 26-Feb-1956) white  male whose primary care physician is NORWOOD, DOROTHY, MD and comes for repair of incarcerated right inguinal hernia.   The indications and risks of the hernia surgery were explained to the patient.  The risks include, but are not limited to, infection, bleeding, recurrence of the hernia, and nerve injury.  Operative Note: The patient was taken to room number 1 at [facility].  He underwent a general anesthesia.   He was given Ancef prior to the incision.  A time out was held and the surgical checklist run.  His lower abdomen was shaved and then prepped with chloroprep.  A right inguinal incision was made through the subcutaneous fat to the external oblique fascia.  The external ring was opened and the cord structures encircled with a penrose drain.  The patient had an right  inguinal hernia. I identified the ileo inguinal nerve and preserved this during the dissection.  The patient had a hernia sac the came along the anterior medial portion of the cord structures.  The sac was dissected free of the cord structures.  The sac was about 12 cm in length.  The sac was opened and went to the peritoneal cavity.  There was no palpable mass within the peritoneal cavity.  The sac was ligated with a 2-0 Vicryl suture.  The inguinal floor was repaired with a 3 x 6 inch piece of Ultrapro mesh.  The mesh was  cut to fit the inguinal floor.  The mesh was sewn in place with interrupted 0 Novafil suture.  A key hole was made for the internal ring.  The mesh lay flat.  The inguinal floor was covered and the internal ring recreated.  The cord structures were returned to a normal location.  The external oblique was closed with a 3-0 vicryl.  The fascia and subcutaneous tissues were infiltrated with 1/4% marcaine plain.  The skin was closed with 4-0 monocryl and painted with LiquidBand. The sponge and needle count were correct at the end of the case.  The patient was transported to the recovery room in good condition.  The patient go home later today, if he is doing well.  Alphonsa Overall, MD, [facility] Pager: [phone] Office phone:  [phone]

## 2015-08-16 NOTE — Discharge Summary (Signed)
Physician Discharge Summary  Patient ID: Jesse Lane MRN: [id number] DOB/AGE: 61/22/1957 61 y.o.  Admit date: 08/15/2015 Discharge date: 08/16/2015  Admission Diagnoses:  Incarcerated RIH  Possible LIH AODM Hypertension Recent GI Ulcer followed in [place]  Discharge Diagnoses:  Incarcerated RIH  Possible LIH AODM Hypertension Recent GI Ulcer followed in [place]   Active Problems:   Incarcerated inguinal hernia   PROCEDURES:  Repair of RIH, 08/16/15, Dr. Elby Beck Course:  Jesse Lane is a 60 y.o. (DOB: 12/18/1955) white male whose primary care physician is Annetta Maw, MD and comes to [place] today for groin pain. His girl friend, Tula Nakayama, is with him. He has had a known right inguinal hernia for 3 years, but because of finances, has not had this repaired. He was recently set to have inguinal hernia surgery by Dr. Lonia Skinner (urology) in [facility], but apparently his insurance would not cover the surgery. So he is aware of hernia surgery, its risks, and potential complications. He had a recent colonoscopy and upper endoscopy by Dr. Carlean Jews. Ferdinand Lango in [place]. He was found to have a "stomach ulcer". He is on carafate and Protonix for this.  He was seen in the Ed and taken to the OR later that evening evening. He said the hernia reduced easily with anesthesia and he just had to do the repair which was done with Mesh.  The following AM, he was tolerating a diet well, and wanted to go home.  He has AODM and hypertension both of which have not been well controlled.  He was to see his PCP TODAY, but was here.  We restarted his home Medicines, and instructed him to see his PCP as soon as he could to help with BP and glucose control.    CBC Latest Ref Rng 08/15/2015 12/05/2011  WBC 4.0 - 10.5 K/uL 5.5 8.7  Hemoglobin 13.0 - 17.0 g/dL 13.0 14.0  Hematocrit 39.0 - 52.0 % 34.7(L) 38.4(L)  Platelets 150 - 400 K/uL 147(L) 150   CMP  Latest Ref Rng 08/15/2015 12/05/2011  Glucose 65 - 99 mg/dL 266(H) 347(H)  BUN 6 - 20 mg/dL 10 12  Creatinine 0.61 - 1.24 mg/dL 0.59(L) 0.80  Sodium 135 - 145 mmol/L 130(L) 134(L)  Potassium 3.5 - 5.1 mmol/L 3.7 4.3  Chloride 101 - 111 mmol/L 101 96  CO2 22 - 32 mmol/L 21(L) 11(L)  Calcium 8.9 - 10.3 mg/dL 9.0 9.9   Glucose on the floor ran between 216 and 251.    Condition on d/c:  Improved    Disposition: 01-Home or Self Care     Medication List    TAKE these medications        acetaminophen 500 MG tablet  Commonly known as:  TYLENOL  Take 500 mg by mouth every 6 (six) hours as needed for mild pain or headache.     gabapentin 300 MG capsule  Commonly known as:  NEURONTIN  Take 300 mg by mouth 3 (three) times daily as needed (pain).     HYDROcodone-acetaminophen 5-325 MG tablet  Commonly known as:  NORCO/VICODIN  Take 1-2 tablets by mouth every 4 (four) hours as needed for moderate pain.     metFORMIN 1000 MG tablet  Commonly known as:  GLUCOPHAGE  Take 1,000 mg by mouth 2 (two) times daily with a meal.     omeprazole 40 MG capsule  Commonly known as:  PRILOSEC  Take 40 mg by  mouth daily.     ranitidine 150 MG tablet  Commonly known as:  ZANTAC  Take 150 mg by mouth 2 (two) times daily as needed for heartburn.     sucralfate 1 g tablet  Commonly known as:  CARAFATE  Take 1 g by mouth 2 (two) times daily. On an empty stomach 2-3 hours after meal           Follow-up Information    Follow up with [facility], MD.   Specialty:  General Surgery   Why:  Call and make an appointment in 3 weeks   Contact information:   [place] [address] [phone]       Follow up with Annetta Maw, MD.   Specialty:  Internal Medicine   Why:  Call and make a follow up appointment for your Blood pressure, night sweats, and glucose control.   Contact information:   [address] [place] [address] [id number]      Annetta Maw, MD  Signed: Earnstine Regal 08/16/2015, 4:19 PM

## 2015-08-16 NOTE — Progress Notes (Signed)
1 Day Post-Op  Subjective: Had some nausea with PO's earlier, recent night sweats also.   Objective: Vital signs in last 24 hours: Temp:  [98.2 F (36.8 C)-98.9 F (37.2 C)] 98.2 F (36.8 C) (05/09 0500) Pulse Rate:  [70-88] 73 (05/09 0500) Resp:  [12-20] 12 (05/09 0500) BP: (145-203)/(84-110) 150/90 mmHg (05/09 0500) SpO2:  [98 %-100 %] 100 % (05/09 0500) Weight:  [63.504 kg (140 lb)] 63.504 kg (140 lb) (05/08 1839) Last BM Date: 08/15/15 Na 130 on admit Glucose 266 last PM, 251 this AM He did not have a CT so we can restart his Metformin Afebrile, BP up lat PM none since 4 AM Glucose still up. Intake/Output from previous day: 05/08 0701 - 05/09 0700 In: 1493.3 [I.V.:1493.3] Out: 50 [Blood:50] Intake/Output this shift:    General appearance: alert, cooperative and no distress Resp: clear to auscultation bilaterally GI: soft, + BS, incision looks fine.  Lab Results:   Recent Labs  08/15/15 1953  WBC 5.5  HGB 13.0  HCT 34.7*  PLT 147*    BMET  Recent Labs  08/15/15 1953  NA 130*  K 3.7  CL 101  CO2 21*  GLUCOSE 266*  BUN 10  CREATININE 0.59*  CALCIUM 9.0   PT/INR No results for input(s): LABPROT, INR in the last 72 hours.  No results for input(s): AST, ALT, ALKPHOS, BILITOT, PROT, ALBUMIN in the last 168 hours.   Lipase  No results found for: LIPASE   Studies/Results: Dg Abd Acute W/chest  08/15/2015  CLINICAL DATA:  Non reducible right inguinal hernia. Swelling and tenderness. EXAM: DG ABDOMEN ACUTE W/ 1V CHEST COMPARISON:  None. FINDINGS: Several mildly dilated small bowel loops are seen within the mid abdomen and pelvis. There is also gas and stool throughout colon. There is no evidence free intraperitoneal air. This may be due to a partial small bowel obstruction or adynamic ileus. Heart size and mediastinal contours are within normal limits. Both lungs are clear. IMPRESSION: Partial/low-grade small bowel obstruction versus adynamic ileus. No active  cardiopulmonary disease. Electronically Signed   By: Earle Gell M.D.   On: 08/15/2015 20:58    Medications: . heparin  5,000 Units Subcutaneous Q8H  . insulin aspart      . insulin aspart  0-15 Units Subcutaneous TID WC  . [START ON 08/17/2015] pantoprazole  40 mg Oral BID  . sucralfate  1 g Oral TID WC & HS    Assessment/Plan Incarcerated RIH   Repair of RIH, 08/16/15, Dr. Alphonsa Overall Possible LIH AODM Hypertension Recent GI Ulcer followed in [place] FEN:  Diet: heart healthy\ ID:  Pre op only  VTE:  Heparin  Plan:  BP 162/87, HR 70, 100% room air, sites look fine,  I am going to have him eat and then he can go home.  He was suppose to see his PCP today, but will not make the appointment.  He know BP is to high and glucose is to high.   Pcp: Annetta Maw, MD         Earnstine Regal 08/16/2015 [phone]

## 2015-08-16 NOTE — Transfer of Care (Signed)
Immediate Anesthesia Transfer of Care Note  Patient: Jesse Lane  Procedure(s) Performed: Procedure(s): HERNIA REPAIR INGUINAL INCARCERATED (N/A)  Patient Location: PACU  Anesthesia Type:General  Level of Consciousness: awake  Airway & Oxygen Therapy: Patient Spontanous Breathing and Patient connected to nasal cannula oxygen  Post-op Assessment: Report given to RN and Post -op Vital signs reviewed and stable  Post vital signs: Reviewed and stable  Last Vitals:  Filed Vitals:   08/15/15 2134 08/15/15 2225  BP: 184/103 203/110  Pulse: 75 72  Temp:  37.2 C  Resp: 20 20    Last Pain:  Filed Vitals:   08/15/15 2231  PainSc: 7       Patients Stated Pain Goal: 3 ([id number])  Complications: No apparent anesthesia complications

## 2015-08-16 NOTE — Progress Notes (Signed)
Pt given discharge instructions and all questions answered. Pt will be discharging with girlfriend via private vehicle.

## 2015-08-17 LAB — HEMOGLOBIN A1C
Hgb A1c MFr Bld: 9.1 % — ABNORMAL HIGH (ref 4.8–5.6)
MEAN PLASMA GLUCOSE: 214 mg/dL

## 2015-08-17 NOTE — Anesthesia Postprocedure Evaluation (Signed)
Anesthesia Post Note  Patient: Jesse Lane  Procedure(s) Performed: Procedure(s) (LRB): HERNIA REPAIR INGUINAL INCARCERATED (N/A)  Patient location during evaluation: PACU Anesthesia Type: General Level of consciousness: awake and alert Pain management: pain level controlled Vital Signs Assessment: post-procedure vital signs reviewed and stable Respiratory status: spontaneous breathing, nonlabored ventilation, respiratory function stable and patient connected to nasal cannula oxygen Cardiovascular status: blood pressure returned to baseline and stable Postop Assessment: no signs of nausea or vomiting Anesthetic complications: no    Last Vitals:  Filed Vitals:   08/16/15 0400 08/16/15 0500  BP: 145/84 150/90  Pulse: 72 73  Temp: 36.9 C 36.8 C  Resp: 12 12    Last Pain:  Filed Vitals:   08/16/15 1211  PainSc: 5                  Zunairah Devers S

## 2016-06-10 DIAGNOSIS — C4361 Malignant melanoma of right upper limb, including shoulder: Secondary | ICD-10-CM | POA: Insufficient documentation

## 2016-06-13 DIAGNOSIS — R634 Abnormal weight loss: Secondary | ICD-10-CM | POA: Insufficient documentation

## 2016-08-11 ENCOUNTER — Inpatient Hospital Stay (HOSPITAL_COMMUNITY)
Admission: EM | Admit: 2016-08-11 | Discharge: 2016-08-13 | DRG: 638 | Disposition: A | Discharge status: Home / Self Care | Payer: BLUE CROSS/BLUE SHIELD | Attending: Family Medicine | Admitting: Family Medicine

## 2016-08-11 DIAGNOSIS — R636 Underweight: Secondary | ICD-10-CM | POA: Diagnosis present

## 2016-08-11 DIAGNOSIS — E1110 Type 2 diabetes mellitus with ketoacidosis without coma: Secondary | ICD-10-CM | POA: Diagnosis not present

## 2016-08-11 DIAGNOSIS — N179 Acute kidney failure, unspecified: Secondary | ICD-10-CM | POA: Diagnosis present

## 2016-08-11 DIAGNOSIS — D696 Thrombocytopenia, unspecified: Secondary | ICD-10-CM | POA: Diagnosis present

## 2016-08-11 DIAGNOSIS — Z79899 Other long term (current) drug therapy: Secondary | ICD-10-CM

## 2016-08-11 DIAGNOSIS — I10 Essential (primary) hypertension: Secondary | ICD-10-CM | POA: Diagnosis present

## 2016-08-11 DIAGNOSIS — E860 Dehydration: Secondary | ICD-10-CM | POA: Diagnosis present

## 2016-08-11 DIAGNOSIS — Z8711 Personal history of peptic ulcer disease: Secondary | ICD-10-CM

## 2016-08-11 DIAGNOSIS — Z9221 Personal history of antineoplastic chemotherapy: Secondary | ICD-10-CM

## 2016-08-11 DIAGNOSIS — Z794 Long term (current) use of insulin: Secondary | ICD-10-CM

## 2016-08-11 DIAGNOSIS — E1010 Type 1 diabetes mellitus with ketoacidosis without coma: Secondary | ICD-10-CM

## 2016-08-11 DIAGNOSIS — C4359 Malignant melanoma of other part of trunk: Secondary | ICD-10-CM | POA: Diagnosis present

## 2016-08-11 DIAGNOSIS — Z681 Body mass index (BMI) 19 or less, adult: Secondary | ICD-10-CM

## 2016-08-11 HISTORY — DX: Malignant (primary) neoplasm, unspecified: C80.1

## 2016-08-11 NOTE — ED Notes (Signed)
Bed: YB01 Expected date:  Expected time:  Means of arrival:  Comments: 60yo CA pt/lethargic

## 2016-08-11 NOTE — ED Triage Notes (Signed)
Pt BIB [facility] from home for hyperglycemia, cbg of 574 with EMS. Pt has hx of CA last chemo April 26th. Pt reports feeling progressively worse with general weakness and frequent urination. Pt has experienced n/v and fever. Hx of DKA

## 2016-08-12 ENCOUNTER — Emergency Department (HOSPITAL_COMMUNITY): Payer: BLUE CROSS/BLUE SHIELD

## 2016-08-12 ENCOUNTER — Encounter (HOSPITAL_COMMUNITY): Payer: Self-pay | Admitting: Emergency Medicine

## 2016-08-12 DIAGNOSIS — C4359 Malignant melanoma of other part of trunk: Secondary | ICD-10-CM

## 2016-08-12 DIAGNOSIS — Z681 Body mass index (BMI) 19 or less, adult: Secondary | ICD-10-CM | POA: Diagnosis not present

## 2016-08-12 DIAGNOSIS — N179 Acute kidney failure, unspecified: Secondary | ICD-10-CM

## 2016-08-12 DIAGNOSIS — E1110 Type 2 diabetes mellitus with ketoacidosis without coma: Secondary | ICD-10-CM | POA: Diagnosis present

## 2016-08-12 DIAGNOSIS — D696 Thrombocytopenia, unspecified: Secondary | ICD-10-CM | POA: Diagnosis present

## 2016-08-12 DIAGNOSIS — Z9221 Personal history of antineoplastic chemotherapy: Secondary | ICD-10-CM | POA: Diagnosis not present

## 2016-08-12 DIAGNOSIS — Z79899 Other long term (current) drug therapy: Secondary | ICD-10-CM | POA: Diagnosis not present

## 2016-08-12 DIAGNOSIS — R636 Underweight: Secondary | ICD-10-CM | POA: Diagnosis present

## 2016-08-12 DIAGNOSIS — E860 Dehydration: Secondary | ICD-10-CM | POA: Diagnosis present

## 2016-08-12 DIAGNOSIS — Z794 Long term (current) use of insulin: Secondary | ICD-10-CM | POA: Diagnosis not present

## 2016-08-12 DIAGNOSIS — Z8711 Personal history of peptic ulcer disease: Secondary | ICD-10-CM | POA: Diagnosis not present

## 2016-08-12 DIAGNOSIS — E1010 Type 1 diabetes mellitus with ketoacidosis without coma: Secondary | ICD-10-CM | POA: Diagnosis not present

## 2016-08-12 DIAGNOSIS — I10 Essential (primary) hypertension: Secondary | ICD-10-CM | POA: Diagnosis present

## 2016-08-12 LAB — GLUCOSE, CAPILLARY
GLUCOSE-CAPILLARY: 485 mg/dL — AB (ref 65–99)
GLUCOSE-CAPILLARY: 366 mg/dL — AB (ref 65–99)
GLUCOSE-CAPILLARY: 266 mg/dL — AB (ref 65–99)
GLUCOSE-CAPILLARY: 205 mg/dL — AB (ref 65–99)
GLUCOSE-CAPILLARY: 176 mg/dL — AB (ref 65–99)
GLUCOSE-CAPILLARY: 151 mg/dL — AB (ref 65–99)
GLUCOSE-CAPILLARY: 131 mg/dL — AB (ref 65–99)
GLUCOSE-CAPILLARY: 155 mg/dL — AB (ref 65–99)
Glucose-Capillary: 453 mg/dL — ABNORMAL HIGH (ref 65–99)
Glucose-Capillary: 297 mg/dL — ABNORMAL HIGH (ref 65–99)
Glucose-Capillary: 128 mg/dL — ABNORMAL HIGH (ref 65–99)
Glucose-Capillary: 135 mg/dL — ABNORMAL HIGH (ref 65–99)
Glucose-Capillary: 141 mg/dL — ABNORMAL HIGH (ref 65–99)
Glucose-Capillary: 136 mg/dL — ABNORMAL HIGH (ref 65–99)
Glucose-Capillary: 154 mg/dL — ABNORMAL HIGH (ref 65–99)
Glucose-Capillary: 129 mg/dL — ABNORMAL HIGH (ref 65–99)

## 2016-08-12 LAB — BASIC METABOLIC PANEL
Anion gap: 12 (ref 5–15)
Anion gap: 5 (ref 5–15)
Anion gap: 6 (ref 5–15)
BUN: 51 mg/dL — ABNORMAL HIGH (ref 6–20)
BUN: 51 mg/dL — ABNORMAL HIGH (ref 6–20)
BUN: 45 mg/dL — AB (ref 6–20)
BUN: 36 mg/dL — AB (ref 6–20)
BUN: 40 mg/dL — AB (ref 6–20)
CALCIUM: 8.5 mg/dL — AB (ref 8.9–10.3)
CALCIUM: 9.3 mg/dL (ref 8.9–10.3)
CHLORIDE: 102 mmol/L (ref 101–111)
CHLORIDE: 99 mmol/L — AB (ref 101–111)
CO2: <7 mmol/L — ABNORMAL LOW (ref 22–32)
CO2: 13 mmol/L — ABNORMAL LOW (ref 22–32)
CO2: 19 mmol/L — ABNORMAL LOW (ref 22–32)
CO2: 18 mmol/L — ABNORMAL LOW (ref 22–32)
CREATININE: 1.18 mg/dL (ref 0.61–1.24)
CREATININE: 0.94 mg/dL (ref 0.61–1.24)
CREATININE: 0.82 mg/dL (ref 0.61–1.24)
Calcium: 8.9 mg/dL (ref 8.9–10.3)
Calcium: 8.6 mg/dL — ABNORMAL LOW (ref 8.9–10.3)
Calcium: 8.5 mg/dL — ABNORMAL LOW (ref 8.9–10.3)
Chloride: 113 mmol/L — ABNORMAL HIGH (ref 101–111)
Chloride: 115 mmol/L — ABNORMAL HIGH (ref 101–111)
Chloride: 114 mmol/L — ABNORMAL HIGH (ref 101–111)
Creatinine, Ser: 1.76 mg/dL — ABNORMAL HIGH (ref 0.61–1.24)
Creatinine, Ser: 1.67 mg/dL — ABNORMAL HIGH (ref 0.61–1.24)
GFR calc non Af Amer: 40 mL/min — ABNORMAL LOW (ref >60)
GFR calc non Af Amer: 43 mL/min — ABNORMAL LOW (ref >60)
GFR, EST AFRICAN AMERICAN: 46 mL/min — AB (ref >60)
GFR, EST AFRICAN AMERICAN: 49 mL/min — AB (ref >60)
Glucose, Bld: 683 mg/dL (ref 65–99)
Glucose, Bld: 593 mg/dL (ref 65–99)
Glucose, Bld: 238 mg/dL — ABNORMAL HIGH (ref 65–99)
Glucose, Bld: 136 mg/dL — ABNORMAL HIGH (ref 65–99)
Glucose, Bld: 140 mg/dL — ABNORMAL HIGH (ref 65–99)
POTASSIUM: 5.3 mmol/L — AB (ref 3.5–5.1)
POTASSIUM: 5.4 mmol/L — AB (ref 3.5–5.1)
Potassium: 3.9 mmol/L (ref 3.5–5.1)
Potassium: 3.7 mmol/L (ref 3.5–5.1)
Potassium: 3.6 mmol/L (ref 3.5–5.1)
SODIUM: 138 mmol/L (ref 135–145)
SODIUM: 139 mmol/L (ref 135–145)
SODIUM: 138 mmol/L (ref 135–145)
Sodium: 131 mmol/L — ABNORMAL LOW (ref 135–145)
Sodium: 133 mmol/L — ABNORMAL LOW (ref 135–145)

## 2016-08-12 LAB — HEPATIC FUNCTION PANEL
ALT: 10 U/L — AB (ref 17–63)
AST: 9 U/L — AB (ref 15–41)
Albumin: 4.1 g/dL (ref 3.5–5.0)
Alkaline Phosphatase: 60 U/L (ref 38–126)
Bilirubin, Direct: 0.1 mg/dL (ref 0.1–0.5)
Indirect Bilirubin: 2.1 mg/dL — ABNORMAL HIGH (ref 0.3–0.9)
TOTAL PROTEIN: 7.4 g/dL (ref 6.5–8.1)
Total Bilirubin: 2.2 mg/dL — ABNORMAL HIGH (ref 0.3–1.2)

## 2016-08-12 LAB — HIV ANTIBODY (ROUTINE TESTING W REFLEX): HIV Screen 4th Generation wRfx: NONREACTIVE

## 2016-08-12 LAB — BLOOD GAS, VENOUS
O2 SAT: 91 %
PO2 VEN: 74.3 mmHg — AB (ref 32.0–45.0)
Patient temperature: 98.6
pH, Ven: 7.093 — CL (ref 7.250–7.430)

## 2016-08-12 LAB — CBC
HEMATOCRIT: 35.2 % — AB (ref 39.0–52.0)
HEMOGLOBIN: 12 g/dL — AB (ref 13.0–17.0)
MCH: 30.4 pg (ref 26.0–34.0)
MCHC: 34.1 g/dL (ref 30.0–36.0)
MCV: 89.1 fL (ref 78.0–100.0)
Platelets: 215 10*3/uL (ref 150–400)
RBC: 3.95 MIL/uL — AB (ref 4.22–5.81)
RDW: 13.7 % (ref 11.5–15.5)
WBC: 11.8 10*3/uL — ABNORMAL HIGH (ref 4.0–10.5)

## 2016-08-12 LAB — MRSA PCR SCREENING: MRSA BY PCR: NEGATIVE

## 2016-08-12 LAB — I-STAT CG4 LACTIC ACID, ED
Lactic Acid, Venous: 0.86 mmol/L (ref 0.5–1.9)
Lactic Acid, Venous: 1.31 mmol/L (ref 0.5–1.9)

## 2016-08-12 LAB — I-STAT TROPONIN, ED: Troponin i, poc: 0.01 ng/mL (ref 0.00–0.08)

## 2016-08-12 LAB — PROTIME-INR
INR: 1.09
Prothrombin Time: 14.1 seconds (ref 11.4–15.2)

## 2016-08-12 LAB — LIPASE, BLOOD: LIPASE: 41 U/L (ref 11–51)

## 2016-08-12 LAB — CBG MONITORING, ED
GLUCOSE-CAPILLARY: 547 mg/dL — AB (ref 65–99)
Glucose-Capillary: 515 mg/dL (ref 65–99)

## 2016-08-12 MED ORDER — SODIUM CHLORIDE 0.9 % IV BOLUS (SEPSIS)
1000.0000 mL | Freq: Once | INTRAVENOUS | Status: AC
  Administered 2016-08-12: 1000 mL via INTRAVENOUS

## 2016-08-12 MED ORDER — ENOXAPARIN SODIUM 40 MG/0.4ML ~~LOC~~ SOLN
40.0000 mg | SUBCUTANEOUS | Status: DC
  Administered 2016-08-12 – 2016-08-13 (×2): 40 mg via SUBCUTANEOUS
  Filled 2016-08-12: qty 0.4

## 2016-08-12 MED ORDER — SODIUM CHLORIDE 0.9 % IV SOLN: INTRAVENOUS | Status: DC

## 2016-08-12 MED ORDER — INSULIN ASPART 100 UNIT/ML ~~LOC~~ SOLN
0.0000 [IU] | SUBCUTANEOUS | Status: DC
  Administered 2016-08-12: 2 [IU] via SUBCUTANEOUS
  Administered 2016-08-12 – 2016-08-13 (×3): 3 [IU] via SUBCUTANEOUS
  Administered 2016-08-13: 8 [IU] via SUBCUTANEOUS

## 2016-08-12 MED ORDER — INSULIN REGULAR HUMAN 100 UNIT/ML IJ SOLN
INTRAMUSCULAR | Status: DC
  Administered 2016-08-12: 4.4 [IU]/h via INTRAVENOUS
  Filled 2016-08-12: qty 2.5

## 2016-08-12 MED ORDER — PANTOPRAZOLE SODIUM 40 MG PO TBEC
80.0000 mg | DELAYED_RELEASE_TABLET | Freq: Every day | ORAL | Status: DC
  Administered 2016-08-12 – 2016-08-13 (×2): 80 mg via ORAL
  Filled 2016-08-12 (×2): qty 2

## 2016-08-12 MED ORDER — ORAL CARE MOUTH RINSE
15.0000 mL | Freq: Two times a day (BID) | OROMUCOSAL | Status: DC
  Administered 2016-08-12: 15 mL via OROMUCOSAL

## 2016-08-12 MED ORDER — INSULIN GLARGINE 100 UNIT/ML ~~LOC~~ SOLN
10.0000 [IU] | Freq: Every day | SUBCUTANEOUS | Status: DC
  Administered 2016-08-12 – 2016-08-13 (×2): 10 [IU] via SUBCUTANEOUS
  Filled 2016-08-12 (×2): qty 0.1

## 2016-08-12 MED ORDER — DEXTROSE-NACL 5-0.45 % IV SOLN: INTRAVENOUS | Status: DC

## 2016-08-12 MED ORDER — PREGABALIN 50 MG PO CAPS
100.0000 mg | ORAL_CAPSULE | Freq: Three times a day (TID) | ORAL | Status: DC
  Administered 2016-08-12 – 2016-08-13 (×3): 100 mg via ORAL
  Filled 2016-08-12 (×4): qty 2

## 2016-08-12 MED ORDER — SODIUM CHLORIDE 0.9 % IV SOLN
INTRAVENOUS | Status: DC
  Administered 2016-08-12: 04:00:00 via INTRAVENOUS

## 2016-08-12 MED ORDER — ACETAMINOPHEN 325 MG PO TABS: 650.0000 mg | ORAL_TABLET | Freq: Four times a day (QID) | ORAL | Status: DC | PRN

## 2016-08-12 MED ORDER — TRAZODONE HCL 100 MG PO TABS
100.0000 mg | ORAL_TABLET | Freq: Every day | ORAL | Status: DC
  Administered 2016-08-12: 100 mg via ORAL
  Filled 2016-08-12 (×2): qty 1

## 2016-08-12 MED ORDER — KETOROLAC TROMETHAMINE 15 MG/ML IJ SOLN
15.0000 mg | Freq: Once | INTRAMUSCULAR | Status: AC
  Administered 2016-08-12: 15 mg via INTRAVENOUS
  Filled 2016-08-12: qty 1

## 2016-08-12 MED ORDER — SODIUM CHLORIDE 0.45 % IV SOLN
INTRAVENOUS | Status: DC
  Administered 2016-08-12: 23:00:00 via INTRAVENOUS

## 2016-08-12 NOTE — H&P (Signed)
History and Physical    Jesse Lane:[id number] DOB: [birth year] DOA: 08/11/2016  PCP: Annetta Maw, MD  Patient coming from: Home  I have personally briefly reviewed patient's old medical records in [place]  Chief Complaint: Hyperglycemia  HPI: Jesse Lane is a 61 y.o. 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  Patient presents to ED via EMS with c/o worsening malaise for the past few days.  Has had AMS, generalized weakness, fatigue, urinary frequency, nausea, and several episodes of vomiting.  CBG at home is 574 PTA.   ED Course: Patient in DKA with bicarb < 7.   Review of Systems: As per HPI otherwise 10 point review of systems negative.   Past Medical History:  Diagnosis Date  . Cancer ([place])   . Diabetes mellitus   . History of stomach ulcers   . Hypertension     Past Surgical History:  Procedure Laterality Date  . COLONOSCOPY    . INGUINAL HERNIA REPAIR N/A 08/15/2015   Procedure: HERNIA REPAIR INGUINAL INCARCERATED;  Surgeon: Alphonsa Overall, MD;  Location: [facility]S;  Service: General;  Laterality: N/A;  with MESH  . pyloric stenosis    . TONSILLECTOMY       reports that he has never smoked. He has never used smokeless tobacco. He reports that he drinks about 0.6 oz of alcohol per week . He reports that he uses drugs, including Marijuana.  No Known Allergies  History reviewed. No pertinent family history.  Prior to Admission medications   Medication Sig Start Date End Date Taking? Authorizing Provider  acetaminophen (TYLENOL) 500 MG tablet Take 500 mg by mouth every 6 (six) hours as needed for mild pain or headache.   Yes [provider]  lisinopril (PRINIVIL,ZESTRIL) 20 MG tablet Take 20 mg by mouth daily. 07/24/16  Yes [provider]  LYRICA 100 MG capsule Take 100 mg by mouth 3 (three) times daily. 07/27/16  Yes [provider]  metFORMIN (GLUCOPHAGE) 1000 MG  tablet Take 1,000 mg by mouth 2 (two) times daily with a meal.   Yes [provider]  omeprazole (PRILOSEC) 40 MG capsule Take 40 mg by mouth daily.   Yes [provider]  traZODone (DESYREL) 100 MG tablet Take 100 mg by mouth at bedtime. 07/24/16  Yes [provider]    Physical Exam: Vitals:   08/11/16 2351 08/12/16 0024 08/12/16 0030  BP:  (!) 117/50 113/61  Pulse:  84 88  Resp:  (!) 27 (!) 30  Temp:  97.5 F (36.4 C)   TempSrc:  Oral   SpO2: 100% 100% 100%    Constitutional: NAD, calm, comfortable Eyes: PERRL, lids and conjunctivae normal ENMT: Mucous membranes are moist. Posterior pharynx clear of any exudate or lesions.Normal dentition.  Neck: normal, supple, no masses, no thyromegaly Respiratory: clear to auscultation bilaterally, no wheezing, no crackles. Normal respiratory effort. No accessory muscle use.  Cardiovascular: Regular rate and rhythm, no murmurs / rubs / gallops. No extremity edema. 2+ pedal pulses. No carotid bruits.  Abdomen: no tenderness, no masses palpated. No hepatosplenomegaly. Bowel sounds positive.  Musculoskeletal: no clubbing / cyanosis. No joint deformity upper and lower extremities. Good ROM, no contractures. Normal muscle tone.  Skin: no rashes, lesions, ulcers. No induration Neurologic: CN 2-12 grossly intact. Sensation intact, DTR normal. Strength 5/5 in all 4.  Psychiatric: Normal judgment and insight. Alert and oriented x 3. Normal mood.  Labs on Admission: I have personally reviewed following labs and imaging studies  CBC:  Recent Labs Lab 08/12/16 0027  WBC 11.8*  HGB 12.0*  HCT 35.2*  MCV 89.1  PLT 073   Basic Metabolic Panel:  Recent Labs Lab 08/12/16 0027  NA 131*  K 5.3*  CL 99*  CO2 <7*  GLUCOSE 683*  BUN 51*  CREATININE 1.76*  CALCIUM 9.3   GFR: CrCl cannot be calculated (Unknown ideal weight.). Liver Function Tests:  Recent Labs Lab 08/12/16 0027  AST 9*  ALT 10*  ALKPHOS 60    BILITOT 2.2*  PROT 7.4  ALBUMIN 4.1    Recent Labs Lab 08/12/16 0027  LIPASE 41   No results for input(s): AMMONIA in the last 168 hours. Coagulation Profile:  Recent Labs Lab 08/12/16 0027  INR 1.09   Cardiac Enzymes: No results for input(s): CKTOTAL, CKMB, CKMBINDEX, TROPONINI in the last 168 hours. BNP (last 3 results) No results for input(s): PROBNP in the last 8760 hours. HbA1C: No results for input(s): HGBA1C in the last 72 hours. CBG:  Recent Labs Lab 08/12/16 0005  GLUCAP 547*   Lipid Profile: No results for input(s): CHOL, HDL, LDLCALC, TRIG, CHOLHDL, LDLDIRECT in the last 72 hours. Thyroid Function Tests: No results for input(s): TSH, T4TOTAL, FREET4, T3FREE, THYROIDAB in the last 72 hours. Anemia Panel: No results for input(s): VITAMINB12, FOLATE, FERRITIN, TIBC, IRON, RETICCTPCT in the last 72 hours. Urine analysis:    Component Value Date/Time   COLORURINE YELLOW 12/05/2011 1655   APPEARANCEUR CLEAR 12/05/2011 1655   LABSPEC 1.039 (H) 12/05/2011 1655   PHURINE 5.5 12/05/2011 1655   GLUCOSEU >1000 (A) 12/05/2011 1655   HGBUR NEGATIVE 12/05/2011 1655   BILIRUBINUR SMALL (A) 12/05/2011 1655   KETONESUR >80 (A) 12/05/2011 1655   PROTEINUR 30 (A) 12/05/2011 1655   UROBILINOGEN 0.2 12/05/2011 1655   NITRITE NEGATIVE 12/05/2011 1655   LEUKOCYTESUR NEGATIVE 12/05/2011 1655    Radiological Exams on Admission: Dg Chest 2 View  Result Date: 08/12/2016 CLINICAL DATA:  Hyperglycemia.  Generalized weakness and fever. EXAM: CHEST  2 VIEW COMPARISON:  06/25/2016 FINDINGS: The lungs are clear. The pulmonary vasculature is normal. Heart size is normal. Hilar and mediastinal contours are unremarkable. There is no pleural effusion. IMPRESSION: No active cardiopulmonary disease. Electronically Signed   By: Andreas Newport M.D.   On: 08/12/2016 00:58    EKG: Independently reviewed.  Assessment/Plan Principal Problem:   DKA (diabetic ketoacidoses)  (HCC) Active Problems:   AKI (acute kidney injury) ([place])   Malignant melanoma of axilla ([place])    1. DKA - 1. DKA pathway 2. 2L IVF bolus in ED 3. 125 cc/hr 4. Insulin gtt 5. BMP Q4H per protocol 6. VBG pending, may need bicarb gtt depending on pH 2. AKI - due to dehydration from DKA, rehydrating and monitoring kidney function 3. Malignant melanoma - chemo most recently with Keytruda.  DVT prophylaxis: Lovenox Code Status: Full Family Communication: Wife at bedside Disposition Plan: Home after admit Consults called: None Admission status: Admit to inpatient for treatment of DKA   [place], [facility] Pager [phone]  If 7AM-7PM, please contact day team taking care of patient [email or web address] Password [email or web address]  08/12/2016, 2:22 AM

## 2016-08-12 NOTE — ED Provider Notes (Signed)
[facility] DEPT Provider Note   CSN: [id number] Arrival date & time: 08/11/16  2350  By signing my name below, I, Dora Sims, attest that this documentation has been prepared under the direction and in the presence of physician practitioner, Delora Fuel, MD. Electronically Signed: Dora Sims, Scribe. 08/12/2016. 1:44 AM.  History   Chief Complaint Chief Complaint  Patient presents with  . Hyperglycemia   The history is provided by the spouse. No language interpreter was used.    HPI Comments: LEVEL 5 CAVEAT FOR ALTERED MENTAL STATUS Jesse Lane is a 61 y.o. male with PMHx including DM and HTN who presents to the Emergency Department via EMS complaining of worsening malaise for a few days. Wife reports patient has had global weakness, fatigue, urinary frequency, nausea, and several episodes of vomiting over the past few days. Per wife, patient's skin has been red and "flushed" as well and this is not normal for him. Wife also reports that patient has been "out of it" and disoriented over the last 24 hours. Wife notes that patient has not been checking his blood glucose levels at home recently and his CBG was 574 PTA tonight as measured by EMS personnel. Patient is undergoing chemotherapy for cancer and his last session was 08/02/16. Per wife, he denies fever, cough, arthralgias/myalgias, or any other associated symptoms.  Past Medical History:  Diagnosis Date  . Cancer ([place])   . Diabetes mellitus   . History of stomach ulcers   . Hypertension     Patient Active Problem List   Diagnosis Date Noted  . Incarcerated inguinal hernia 08/16/2015    Past Surgical History:  Procedure Laterality Date  . COLONOSCOPY    . INGUINAL HERNIA REPAIR N/A 08/15/2015   Procedure: HERNIA REPAIR INGUINAL INCARCERATED;  Surgeon: Alphonsa Overall, MD;  Location: [facility] ORS;  Service: General;  Laterality: N/A;  with MESH  . pyloric stenosis    . TONSILLECTOMY         Home Medications     Prior to Admission medications   Medication Sig Start Date End Date Taking? Authorizing Provider  acetaminophen (TYLENOL) 500 MG tablet Take 500 mg by mouth every 6 (six) hours as needed for mild pain or headache.   Yes [provider]  lisinopril (PRINIVIL,ZESTRIL) 20 MG tablet Take 20 mg by mouth daily. 07/24/16  Yes [provider]  LYRICA 100 MG capsule Take 100 mg by mouth 3 (three) times daily. 07/27/16  Yes [provider]  metFORMIN (GLUCOPHAGE) 1000 MG tablet Take 1,000 mg by mouth 2 (two) times daily with a meal.   Yes [provider]  omeprazole (PRILOSEC) 40 MG capsule Take 40 mg by mouth daily.   Yes [provider]  traZODone (DESYREL) 100 MG tablet Take 100 mg by mouth at bedtime. 07/24/16  Yes [provider]    Family History History reviewed. No pertinent family history.  Social History Social History  Substance Use Topics  . Smoking status: Never Smoker  . Smokeless tobacco: Never Used  . Alcohol use 0.6 oz/week    1 Glasses of wine per week     Comment: daily     Allergies   Patient has no known allergies.   Review of Systems Review of Systems  Unable to perform ROS: Mental status change   Physical Exam Updated Vital Signs BP 113/61   Pulse 88   Temp 97.5 F (36.4 C) (Oral)   Resp (!) 30   SpO2 100%  Physical Exam  Constitutional: He appears well-developed and well-nourished.  HENT:  Head: Normocephalic and atraumatic.  Mouth/Throat: Mucous membranes are dry.  Eyes: EOM are normal. Pupils are equal, round, and reactive to light.  Neck: Normal range of motion. Neck supple. No JVD present.  Cardiovascular: Normal rate, regular rhythm and normal heart sounds.   No murmur heard. Pulmonary/Chest: Effort normal and breath sounds normal. He has no wheezes. He has no rales. He exhibits no tenderness.  Abdominal: Soft. He exhibits no distension and no mass. Bowel sounds are decreased. There is no  tenderness.  Musculoskeletal: Normal range of motion. He exhibits no edema.  Lymphadenopathy:    He has no cervical adenopathy.  Neurological: He is alert. No cranial nerve deficit. He exhibits normal muscle tone. Coordination normal.  Oriented to person and place but not time.  Skin: Skin is warm and dry. No rash noted.  Mildly flushed.  Nursing note and vitals reviewed.  ED Treatments / Results  Labs (all labs ordered are listed, but only abnormal results are displayed) Labs Reviewed  BASIC METABOLIC PANEL - Abnormal; Notable for the following:       Result Value   Sodium 131 (*)    Potassium 5.3 (*)    Chloride 99 (*)    CO2 <7 (*)    Glucose, Bld 683 (*)    BUN 51 (*)    Creatinine, Ser 1.76 (*)    GFR calc non Af Amer 40 (*)    GFR calc Af Amer 46 (*)    All other components within normal limits  CBC - Abnormal; Notable for the following:    WBC 11.8 (*)    RBC 3.95 (*)    Hemoglobin 12.0 (*)    HCT 35.2 (*)    All other components within normal limits  CBG MONITORING, ED - Abnormal; Notable for the following:    Glucose-Capillary 547 (*)    All other components within normal limits  CULTURE, BLOOD (ROUTINE X 2)  CULTURE, BLOOD (ROUTINE X 2)  PROTIME-INR  URINALYSIS, ROUTINE W REFLEX MICROSCOPIC  I-STAT CG4 LACTIC ACID, ED    EKG  EKG Interpretation None       Radiology Dg Chest 2 View  Result Date: 08/12/2016 CLINICAL DATA:  Hyperglycemia.  Generalized weakness and fever. EXAM: CHEST  2 VIEW COMPARISON:  06/25/2016 FINDINGS: The lungs are clear. The pulmonary vasculature is normal. Heart size is normal. Hilar and mediastinal contours are unremarkable. There is no pleural effusion. IMPRESSION: No active cardiopulmonary disease. Electronically Signed   By: Andreas Newport M.D.   On: 08/12/2016 00:58    Procedures Procedures (including critical care time) CRITICAL CARE Performed by: NWGNF,AOZHY Total critical care time: 45 minutes Critical care time was  exclusive of separately billable procedures and treating other patients. Critical care was necessary to treat or prevent imminent or life-threatening deterioration. Critical care was time spent personally by me on the following activities: development of treatment plan with patient and/or surrogate as well as nursing, discussions with consultants, evaluation of patient's response to treatment, examination of patient, obtaining history from patient or surrogate, ordering and performing treatments and interventions, ordering and review of laboratory studies, ordering and review of radiographic studies, pulse oximetry and re-evaluation of patient's condition.  DIAGNOSTIC STUDIES: Oxygen Saturation is 100% on RA, normal by my interpretation.    COORDINATION OF CARE: 1:54 AM Discussed treatment plan with patient's wife at bedside and she agreed to plan.  Medications Ordered in ED  Medications  sodium chloride 0.9 % bolus 1,000 mL (not administered)  insulin regular (NOVOLIN R,HUMULIN R) 250 Units in sodium chloride 0.9 % 250 mL (1 Units/mL) infusion (not administered)  pregabalin (LYRICA) capsule 100 mg (not administered)  pantoprazole (PROTONIX) EC tablet 80 mg (not administered)  traZODone (DESYREL) tablet 100 mg (not administered)  sodium chloride 0.9 % bolus 1,000 mL (not administered)  0.9 %  sodium chloride infusion (not administered)  dextrose 5 %-0.45 % sodium chloride infusion (not administered)  enoxaparin (LOVENOX) injection 40 mg (not administered)     Initial Impression / Assessment and Plan / ED Course  I have reviewed the triage vital signs and the nursing notes.  Pertinent labs & imaging results that were available during my care of the patient were reviewed by me and considered in my medical decision making (see chart for details).  Altered mental status with vomiting. Laboratory workup is consistent with ketoacidosis. Review of old records does show a prior hospitalization  for ketoacidosis 5 years ago. He recently received an infusion of pembrolizumab - review of the literature for this tests also include diabetic ketoacidosis as a potential side effect. He is started on IV fluids and insulin infusion. He'll need to be admitted. Case is discussed with Dr. Alcario Drought of [facility] who agrees to admit the patient.  Final Clinical Impressions(s) / ED Diagnoses   Final diagnoses:  Diabetic ketoacidosis without coma associated with type 1 diabetes mellitus ([place])  Acute kidney injury (nontraumatic) ([place])    New Prescriptions New Prescriptions   No medications on file   I personally performed the services described in this documentation, which was scribed in my presence. The recorded information has been reviewed and is accurate.      Delora Fuel, MD 53/20/23 [phone]

## 2016-08-12 NOTE — Progress Notes (Signed)
[facility]  Patient admitted after midnight see H&P for further details.  61 year old male with history of melanoma currently on chemotherapy presented to the ED with complaining of malaise and confusion. He checks his capillary blood glucose at home which was 574. In the ED was found to have DKA for which is what admitted in the stepdown and placed on insulin drip with bicarbonate of less than 7.  Patient seen and examined, he is very sleepy oriented only to person and place. Not very interactive. Patient reported he did not take his medications for the past 3 days. Patient report no other complaints.   Vital signs stable  Physical exam unremarkable other than patient being sleepy but responsive to verbal commands.  Impression DKA  Plan Anion gap has closed, bicarbonate of 19 Start basal insulin Lantus 10 units daily Start insulin sliding scale Clear liquid diet We'll keep in stepdown for overnight see if it become more awake and interactive and will transfer to regular floor in the morning.  Rest for H&P  Chipper Oman, MD

## 2016-08-13 DIAGNOSIS — D696 Thrombocytopenia, unspecified: Secondary | ICD-10-CM

## 2016-08-13 DIAGNOSIS — I10 Essential (primary) hypertension: Secondary | ICD-10-CM

## 2016-08-13 LAB — CBC WITH DIFFERENTIAL/PLATELET
Basophils Absolute: 0 10*3/uL (ref 0.0–0.1)
Basophils Relative: 0 %
EOS ABS: 0.1 10*3/uL (ref 0.0–0.7)
EOS PCT: 2 %
HCT: 30.4 % — ABNORMAL LOW (ref 39.0–52.0)
Hemoglobin: 11 g/dL — ABNORMAL LOW (ref 13.0–17.0)
LYMPHS ABS: 1.2 10*3/uL (ref 0.7–4.0)
Lymphocytes Relative: 30 %
MCH: 31.3 pg (ref 26.0–34.0)
MCHC: 36.2 g/dL — ABNORMAL HIGH (ref 30.0–36.0)
MCV: 86.4 fL (ref 78.0–100.0)
MONO ABS: 0.3 10*3/uL (ref 0.1–1.0)
Monocytes Relative: 8 %
Neutro Abs: 2.4 10*3/uL (ref 1.7–7.7)
Neutrophils Relative %: 60 %
PLATELETS: 112 10*3/uL — AB (ref 150–400)
RBC: 3.52 MIL/uL — AB (ref 4.22–5.81)
RDW: 13.8 % (ref 11.5–15.5)
WBC: 4 10*3/uL (ref 4.0–10.5)

## 2016-08-13 LAB — BLOOD CULTURE ID PANEL (REFLEXED)
Acinetobacter baumannii: NOT DETECTED
CANDIDA TROPICALIS: NOT DETECTED
Candida albicans: NOT DETECTED
Candida glabrata: NOT DETECTED
Candida krusei: NOT DETECTED
Candida parapsilosis: NOT DETECTED
ENTEROBACTER CLOACAE COMPLEX: NOT DETECTED
ENTEROCOCCUS SPECIES: NOT DETECTED
Enterobacteriaceae species: NOT DETECTED
Escherichia coli: NOT DETECTED
HAEMOPHILUS INFLUENZAE: NOT DETECTED
KLEBSIELLA PNEUMONIAE: NOT DETECTED
Klebsiella oxytoca: NOT DETECTED
LISTERIA MONOCYTOGENES: NOT DETECTED
METHICILLIN RESISTANCE: NOT DETECTED
NEISSERIA MENINGITIDIS: NOT DETECTED
PROTEUS SPECIES: NOT DETECTED
Pseudomonas aeruginosa: NOT DETECTED
SERRATIA MARCESCENS: NOT DETECTED
STAPHYLOCOCCUS AUREUS BCID: NOT DETECTED
STAPHYLOCOCCUS SPECIES: DETECTED — AB
STREPTOCOCCUS AGALACTIAE: NOT DETECTED
STREPTOCOCCUS SPECIES: NOT DETECTED
Streptococcus pneumoniae: NOT DETECTED
Streptococcus pyogenes: NOT DETECTED

## 2016-08-13 LAB — GLUCOSE, CAPILLARY
GLUCOSE-CAPILLARY: 142 mg/dL — AB (ref 65–99)
GLUCOSE-CAPILLARY: 105 mg/dL — AB (ref 65–99)
GLUCOSE-CAPILLARY: 189 mg/dL — AB (ref 65–99)
GLUCOSE-CAPILLARY: 178 mg/dL — AB (ref 65–99)
Glucose-Capillary: 252 mg/dL — ABNORMAL HIGH (ref 65–99)

## 2016-08-13 LAB — BASIC METABOLIC PANEL
Anion gap: 7 (ref 5–15)
BUN: 30 mg/dL — AB (ref 6–20)
CALCIUM: 8.8 mg/dL — AB (ref 8.9–10.3)
CHLORIDE: 114 mmol/L — AB (ref 101–111)
CO2: 18 mmol/L — ABNORMAL LOW (ref 22–32)
CREATININE: 0.63 mg/dL (ref 0.61–1.24)
GFR calc Af Amer: >60 mL/min (ref >60)
GFR calc non Af Amer: >60 mL/min (ref >60)
Glucose, Bld: 110 mg/dL — ABNORMAL HIGH (ref 65–99)
Potassium: 3.8 mmol/L (ref 3.5–5.1)
SODIUM: 139 mmol/L (ref 135–145)

## 2016-08-13 MED ORDER — ENSURE ENLIVE PO LIQD: 237.0000 mL | Freq: Two times a day (BID) | ORAL | Status: DC

## 2016-08-13 MED ORDER — INSULIN DETEMIR 100 UNIT/ML FLEXPEN: 7.0000 [IU] | PEN_INJECTOR | Freq: Two times a day (BID) | SUBCUTANEOUS | 0 refills | Status: DC

## 2016-08-13 MED ORDER — ONDANSETRON HCL 4 MG PO TABS: 4.0000 mg | ORAL_TABLET | Freq: Every day | ORAL | 1 refills | Status: DC | PRN

## 2016-08-13 MED ORDER — ADULT MULTIVITAMIN W/MINERALS CH: 1.0000 | ORAL_TABLET | Freq: Every day | ORAL | Status: DC

## 2016-08-13 NOTE — Care Management Note (Signed)
Case Management Note  Patient Details  Name: HUNTLEY KNOOP MRN: [id number] Date of Birth: 1956/02/17  Subjective/Objective:              Hyperglycemia and aki      Action/Plan: Date:  Aug 13, 2016  Chart reviewed for concurrent status and case management needs.  Will continue to follow patient progress.  Discharge Planning: following for needs  Expected discharge date: [id number]  Velva Harman, BSN, [place], [place]   Expected Discharge Date:                  Expected Discharge Plan:  Home/Self Care  In-House Referral:     Discharge planning Services  CM Consult  Post Acute Care Choice:    Choice offered to:     DME Arranged:    DME Agency:     HH Arranged:    HH Agency:     Status of Service:  In process, will continue to follow  If discussed at Long Length of Stay Meetings, dates discussed:    Additional Comments:  Leeroy Cha, RN 08/13/2016, 9:31 AM

## 2016-08-13 NOTE — Progress Notes (Signed)
Initial Nutrition Assessment  DOCUMENTATION CODES:   Underweight  INTERVENTION:  - Will order Ensure Enlive BID, each supplement provides 350 kcal and 20 grams of protein - Will order daily multivitamin with minerals. - Continue to encourage PO intakes of meals and supplements. - RD will monitor for additional nutrition-related needs.  NUTRITION DIAGNOSIS:   Unintentional weight loss related to acute illness, poor appetite as evidenced by percent weight loss.  GOAL:   Patient will meet greater than or equal to 90% of their needs  MONITOR:   PO intake, Supplement acceptance, Weight trends, Labs  REASON FOR ASSESSMENT:   Malnutrition Screening Tool  ASSESSMENT:   61 y.o. 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  Patient presents to ED via EMS with c/o worsening malaise for the past few days.  Has had AMS, generalized weakness, fatigue, urinary frequency, nausea, and several episodes of vomiting.  CBG at home is 574 PTA.  Pt seen for MST. BMI indicates underweight status. Pt sleeping soundly at this time and no family/visitors present; did not want to awake pt at this time. No intakes documented since admission. Per review of chart, pt with axilla melanoma with last chemo on 08/02/16.   Unable to perform physical assessment at this time with respect to pt comfort and he is covered from neck to toes in blankets. Per chart review, weight 08/02/16 at [facility] was 135 lbs. Based on current weight, this indicates 13 lb weight loss (9.6% body weight) in 11 days which is significant for time frame.   Medications reviewed; sliding scale Novolog, 10 units Lantus/day, 80 mg oral Protonix/day.  Labs reviewed; CBGs: 105 and 189 mg/dL today, Cl: 114 mmol/L, BUN: 30 mg/dL, Ca: 8.8 mg/dL.  IVF: 1/2 NS @ 100 mL/hr.    Diet Order:  Diet Carb Modified Fluid consistency: Thin; Room service appropriate? Yes  Skin:  Reviewed, no issues  Last BM:   PTA/unknown  Height:   Ht Readings from Last 1 Encounters:  08/12/16 5\' 11"  (1.803 m)    Weight:   Wt Readings from Last 1 Encounters:  08/12/16 122 lb 5.7 oz (55.5 kg)    Ideal Body Weight:  78.18 kg  BMI:  Body mass index is 17.07 kg/m.  Estimated Nutritional Needs:   Kcal:  1940-2165 (35-39 kcal/kg)  Protein:  83-94 grams (1.5-1.7 grams/kg)  Fluid:  >/= 1.9 L/day  EDUCATION NEEDS:   No education needs identified at this time    Jarome Matin, MS, RD, LDN, CNSC Inpatient Clinical Dietitian Pager # [phone] After hours/weekend pager # [phone]

## 2016-08-13 NOTE — Progress Notes (Signed)
PHARMACY - PHYSICIAN COMMUNICATION CRITICAL VALUE ALERT - BLOOD CULTURE IDENTIFICATION (BCID)  Results for orders placed or performed during the hospital encounter of 08/11/16  Blood Culture ID Panel (Reflexed) (Collected: 08/12/2016 12:27 AM)  Result Value Ref Range   Enterococcus species NOT DETECTED NOT DETECTED   Listeria monocytogenes NOT DETECTED NOT DETECTED   Staphylococcus species DETECTED (A) NOT DETECTED   Staphylococcus aureus NOT DETECTED NOT DETECTED   Methicillin resistance NOT DETECTED NOT DETECTED   Streptococcus species NOT DETECTED NOT DETECTED   Streptococcus agalactiae NOT DETECTED NOT DETECTED   Streptococcus pneumoniae NOT DETECTED NOT DETECTED   Streptococcus pyogenes NOT DETECTED NOT DETECTED   Acinetobacter baumannii NOT DETECTED NOT DETECTED   Enterobacteriaceae species NOT DETECTED NOT DETECTED   Enterobacter cloacae complex NOT DETECTED NOT DETECTED   Escherichia coli NOT DETECTED NOT DETECTED   Klebsiella oxytoca NOT DETECTED NOT DETECTED   Klebsiella pneumoniae NOT DETECTED NOT DETECTED   Proteus species NOT DETECTED NOT DETECTED   Serratia marcescens NOT DETECTED NOT DETECTED   Haemophilus influenzae NOT DETECTED NOT DETECTED   Neisseria meningitidis NOT DETECTED NOT DETECTED   Pseudomonas aeruginosa NOT DETECTED NOT DETECTED   Candida albicans NOT DETECTED NOT DETECTED   Candida glabrata NOT DETECTED NOT DETECTED   Candida krusei NOT DETECTED NOT DETECTED   Candida parapsilosis NOT DETECTED NOT DETECTED   Candida tropicalis NOT DETECTED NOT DETECTED    Name of physician (or Provider) Contacted: Dr. Patrecia Pour  Changes to prescribed antibiotics required: None. ? if contaminant. MD has ordered repeat blood cultures.   Luiz Ochoa 08/13/2016  8:42 AM

## 2016-08-13 NOTE — Discharge Summary (Signed)
Physician Discharge Summary  Jesse Lane  NLZ:[id number]  DOB: 27-May-1955  DOA: 08/11/2016 PCP: Annetta Maw, MD  Admit date: 08/11/2016 Discharge date: 08/13/2016  Admitted From: Home  Disposition:  Home   Recommendations for Outpatient Follow-up:  1. Follow up with PCP in 1 week 2. Please obtain BMP/CBC in one week 3. Please follow up on the following pending results: Blood cultures final results  4. 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 Patient found his CBG 575 at home. In the ED was found to be in DKA, so he was admitted to the SDU for further management. Patient was started on Insulin ggt, subsequently glucose levels stabilized and was transitioned to sq insuline with basal and SSI. Patient diet was advance and he is tolerating well. Upon admission blood cultures where drawn, an 1/2 bottle came positive for staph species possible contaminant. Patient is afebrile, has no signs of infections. Blood cultures were repeated and will follow up as outpatient with PCP.   Subjective: Patient seen and examined, continues to improve, CBG's stable. No complaints. Patient afebrile, no acute events overnight. Tolerating diet well.   Discharge Diagnoses/Hospital Course:  DKA in type 2 DM  Treated with insulin drip, transitioned to sq insulin with Lantus  Will discharge patient on Levemir 7 units BID  Will resume home oral hypoglycemic agent with no changes in current dose Monitor CBG goal < 140 fasting  A1C pending - follow results with PCP and for insulin adjustment   HTN slight above goal  Likely from aggressive fluid hydration and holding BP meds on admission  Resume  Lisinopril  Follow with PCP in 1 week   Thrombocytopenia  Likely from heparin use - Heparin d/ced Check cbc in 1 week   Malignant melanoma of the axilla  Chemo with Ruel Favors at [facility] - need to discuss with oncology side effect of this medication at some cases has been reported to induce DKA  Will differ to oncologist as outpatient for further recommendations   On the day of the discharge the patient's vitals were stable, and no other acute medical condition were reported by patient. Patient was felt safe to be discharge to home.   Discharge Instructions  You were cared for by a hospitalist during your hospital stay. If you have any questions about your discharge medications or the care you received while you were in the hospital after you are discharged, you can call the unit and asked to speak with the hospitalist on call if the hospitalist that took care of you is not available. Once you are discharged, your primary care physician will handle any further medical issues. Please note that NO REFILLS for any discharge medications will be authorized once you are discharged, as it is imperative that you return to your primary care physician (or establish a relationship with a primary care physician if you do not have one) for your aftercare needs so that they can reassess your need for medications and monitor your lab values.  Discharge Instructions    Call MD for:  difficulty breathing, headache or visual disturbances    Complete by:  As directed    Call  MD for:  extreme fatigue    Complete by:  As directed    Call MD for:  hives    Complete by:  As directed    Call MD for:  persistant dizziness or light-headedness    Complete by:  As directed    Call MD for:  persistant nausea and vomiting    Complete by:  As directed    Call MD for:  redness, tenderness, or signs of infection (pain, swelling, redness, odor or green/yellow discharge around incision site)    Complete by:  As  directed    Call MD for:  severe uncontrolled pain    Complete by:  As directed    Call MD for:  temperature >100.4    Complete by:  As directed    Diet - low sodium heart healthy    Complete by:  As directed    Increase activity slowly    Complete by:  As directed      Allergies as of 08/13/2016   No Known Allergies     Medication List    TAKE these medications   acetaminophen 500 MG tablet Commonly known as:  TYLENOL Take 500 mg by mouth every 6 (six) hours as needed for mild pain or headache.   Insulin Detemir 100 UNIT/ML Pen Commonly known as:  LEVEMIR Inject 7 Units into the skin 2 (two) times daily.   lisinopril 20 MG tablet Commonly known as:  PRINIVIL,ZESTRIL Take 20 mg by mouth daily.   LYRICA 100 MG capsule Generic drug:  pregabalin Take 100 mg by mouth 3 (three) times daily.   metFORMIN 1000 MG tablet Commonly known as:  GLUCOPHAGE Take 1,000 mg by mouth 2 (two) times daily with a meal.   omeprazole 40 MG capsule Commonly known as:  PRILOSEC Take 40 mg by mouth daily.   traZODone 100 MG tablet Commonly known as:  DESYREL Take 100 mg by mouth at bedtime.      Follow-up Information    Annetta Maw, MD. Schedule an appointment as soon as possible for a visit in 1 week(s).   Specialty:  Internal Medicine Contact information: [address] [place] [address] [phone]          No Known Allergies  Consultations:  None    Procedures/Studies: Dg Chest 2 View  Result Date: 08/12/2016 CLINICAL DATA:  Hyperglycemia.  Generalized weakness and fever. EXAM: CHEST  2 VIEW COMPARISON:  06/25/2016 FINDINGS: The lungs are clear. The pulmonary vasculature is normal. Heart size is normal. Hilar and mediastinal contours are unremarkable. There is no pleural effusion. IMPRESSION: No active cardiopulmonary disease. Electronically Signed   By: Andreas Newport M.D.   On: 08/12/2016 00:58    Discharge Exam: Vitals:   08/13/16 1100 08/13/16 1200   BP:  (!) 174/90  Pulse: 73 70  Resp: 20 17  Temp:  98 F (36.7 C)   Vitals:   08/13/16 0900 08/13/16 1000 08/13/16 1100 08/13/16 1200  BP:  (!) 120/56  (!) 174/90  Pulse: 78 81 73 70  Resp: 16 (!) 24 20 17   Temp:    98 F (36.7 C)  TempSrc:    Oral  SpO2: 99% 96% 97% 98%  Weight:      Height:        General: Pt is alert, awake, not in acute distress Cardiovascular: RRR, S1/S2 +, no rubs, no gallops Respiratory: CTA bilaterally, no wheezing, no rhonchi Abdominal: Soft, NT, ND, bowel sounds + Extremities:  no edema, no cyanosis   The results of significant diagnostics from this hospitalization (including imaging, microbiology, ancillary and laboratory) are listed below for reference.     Microbiology: Recent Results (from the past 240 hour(s))  Culture, blood (Routine x 2)     Status: None (Preliminary result)   Collection Time: 08/12/16 12:27 AM  Result Value Ref Range Status   Specimen Description BLOOD RIGHT ANTECUBITAL  Final   Special Requests   Final    BOTTLES DRAWN AEROBIC AND ANAEROBIC Blood Culture adequate volume   Culture  Setup Time   Final    GRAM POSITIVE COCCI IN CLUSTERS AEROBIC BOTTLE ONLY CRITICAL RESULT CALLED TO, READ BACK BY AND VERIFIED WITH: N. [place], AT 4270 08/13/16 BY Rush Landmark Performed at [facility], [place] [address] [place], [place] [address]    Culture GRAM POSITIVE COCCI  Final   Report Status PENDING  Incomplete  Culture, blood (Routine x 2)     Status: None (Preliminary result)   Collection Time: 08/12/16 12:27 AM  Result Value Ref Range Status   Specimen Description BLOOD RIGHT ANTECUBITAL  Final   Special Requests   Final    BOTTLES DRAWN AEROBIC AND ANAEROBIC Blood Culture adequate volume   Culture   Final    NO GROWTH 1 DAY Performed at [facility], [place] [address] [place], [place] [address]    Report Status PENDING  Incomplete  Blood Culture ID Panel (Reflexed)     Status: Abnormal   Collection  Time: 08/12/16 12:27 AM  Result Value Ref Range Status   Enterococcus species NOT DETECTED NOT DETECTED Final   Listeria monocytogenes NOT DETECTED NOT DETECTED Final   Staphylococcus species DETECTED (A) NOT DETECTED Final    Comment: Methicillin (oxacillin) susceptible coagulase negative staphylococcus. Possible blood culture contaminant (unless isolated from more than one blood culture draw or clinical case suggests pathogenicity). No antibiotic treatment is indicated for blood  culture contaminants. CRITICAL RESULT CALLED TO, READ BACK BY AND VERIFIED WITH: N. [place], AT 1761 08/13/16 BY D. VANHOOK      Staphylococcus aureus NOT DETECTED NOT DETECTED Final   Methicillin resistance NOT DETECTED NOT DETECTED Final   Streptococcus species NOT DETECTED NOT DETECTED Final   Streptococcus agalactiae NOT DETECTED NOT DETECTED Final   Streptococcus pneumoniae NOT DETECTED NOT DETECTED Final   Streptococcus pyogenes NOT DETECTED NOT DETECTED Final   Acinetobacter baumannii NOT DETECTED NOT DETECTED Final   Enterobacteriaceae species NOT DETECTED NOT DETECTED Final   Enterobacter cloacae complex NOT DETECTED NOT DETECTED Final   Escherichia coli NOT DETECTED NOT DETECTED Final   Klebsiella oxytoca NOT DETECTED NOT DETECTED Final   Klebsiella pneumoniae NOT DETECTED NOT DETECTED Final   Proteus species NOT DETECTED NOT DETECTED Final   Serratia marcescens NOT DETECTED NOT DETECTED Final   Haemophilus influenzae NOT DETECTED NOT DETECTED Final   Neisseria meningitidis NOT DETECTED NOT DETECTED Final   Pseudomonas aeruginosa NOT DETECTED NOT DETECTED Final   Candida albicans NOT DETECTED NOT DETECTED Final   Candida glabrata NOT DETECTED NOT DETECTED Final   Candida krusei NOT DETECTED NOT DETECTED Final   Candida parapsilosis NOT DETECTED NOT DETECTED Final   Candida tropicalis NOT DETECTED NOT DETECTED Final    Comment: Performed at [facility], [address]  [place], [place] [address]  MRSA PCR Screening     Status: None   Collection Time: 08/12/16  3:40 AM  Result Value  Ref Range Status   MRSA by PCR NEGATIVE NEGATIVE Final    Comment:        The GeneXpert MRSA Assay (FDA approved for NASAL specimens only), is one component of a comprehensive MRSA colonization surveillance program. It is not intended to diagnose MRSA infection nor to guide or monitor treatment for MRSA infections.      Labs: BNP (last 3 results) No results for input(s): BNP in the last 8760 hours. Basic Metabolic Panel:  Recent Labs Lab 08/12/16 0230 08/12/16 0846 08/12/16 1256 08/12/16 1621 08/13/16 0332  NA 133* 138 139 138 139  K 5.4* 3.9 3.7 3.6 3.8  CL 102 113* 115* 114* 114*  CO2 <7* 13* 19* 18* 18*  GLUCOSE 593* 238* 136* 140* 110*  BUN 51* 45* 40* 36* 30*  CREATININE 1.67* 1.18 0.94 0.82 0.63  CALCIUM 8.9 8.6* 8.5* 8.5* 8.8*   Liver Function Tests:  Recent Labs Lab 08/12/16 0027  AST 9*  ALT 10*  ALKPHOS 60  BILITOT 2.2*  PROT 7.4  ALBUMIN 4.1    Recent Labs Lab 08/12/16 0027  LIPASE 41   No results for input(s): AMMONIA in the last 168 hours. CBC:  Recent Labs Lab 08/12/16 0027 08/13/16 0332  WBC 11.8* 4.0  NEUTROABS  --  2.4  HGB 12.0* 11.0*  HCT 35.2* 30.4*  MCV 89.1 86.4  PLT 215 112*   Cardiac Enzymes: No results for input(s): CKTOTAL, CKMB, CKMBINDEX, TROPONINI in the last 168 hours. BNP: Invalid input(s): POCBNP CBG:  Recent Labs Lab 08/12/16 1931 08/12/16 2312 08/13/16 0306 08/13/16 0741 08/13/16 1216  GLUCAP 154* 129* 105* 189* 252*   D-Dimer No results for input(s): DDIMER in the last 72 hours. Hgb A1c No results for input(s): HGBA1C in the last 72 hours. Lipid Profile No results for input(s): CHOL, HDL, LDLCALC, TRIG, CHOLHDL, LDLDIRECT in the last 72 hours. Thyroid function studies No results for input(s): TSH, T4TOTAL, T3FREE, THYROIDAB in the last 72 hours.  Invalid input(s): FREET3 Anemia  work up No results for input(s): VITAMINB12, FOLATE, FERRITIN, TIBC, IRON, RETICCTPCT in the last 72 hours. Urinalysis    Component Value Date/Time   COLORURINE YELLOW 12/05/2011 [place] 12/05/2011 1655   LABSPEC 1.039 (H) 12/05/2011 1655   PHURINE 5.5 12/05/2011 1655   GLUCOSEU >1000 (A) 12/05/2011 1655   HGBUR NEGATIVE 12/05/2011 1655   BILIRUBINUR SMALL (A) 12/05/2011 1655   KETONESUR >80 (A) 12/05/2011 1655   PROTEINUR 30 (A) 12/05/2011 1655   UROBILINOGEN 0.2 12/05/2011 1655   NITRITE NEGATIVE 12/05/2011 1655   LEUKOCYTESUR NEGATIVE 12/05/2011 1655   Sepsis Labs Invalid input(s): PROCALCITONIN,  WBC,  LACTICIDVEN Microbiology Recent Results (from the past 240 hour(s))  Culture, blood (Routine x 2)     Status: None (Preliminary result)   Collection Time: 08/12/16 12:27 AM  Result Value Ref Range Status   Specimen Description BLOOD RIGHT ANTECUBITAL  Final   Special Requests   Final    BOTTLES DRAWN AEROBIC AND ANAEROBIC Blood Culture adequate volume   Culture  Setup Time   Final    GRAM POSITIVE COCCI IN CLUSTERS AEROBIC BOTTLE ONLY CRITICAL RESULT CALLED TO, READ BACK BY AND VERIFIED WITH: N. [place], AT 7253 08/13/16 BY Rush Landmark Performed at [facility], [place] [address], [place], [place] [address]    Culture GRAM POSITIVE COCCI  Final   Report Status PENDING  Incomplete  Culture, blood (Routine x 2)     Status:  None (Preliminary result)   Collection Time: 08/12/16 12:27 AM  Result Value Ref Range Status   Specimen Description BLOOD RIGHT ANTECUBITAL  Final   Special Requests   Final    BOTTLES DRAWN AEROBIC AND ANAEROBIC Blood Culture adequate volume   Culture   Final    NO GROWTH 1 DAY Performed at [facility], [address], [place], [place] [address]    Report Status PENDING  Incomplete  Blood Culture ID Panel (Reflexed)     Status: Abnormal   Collection Time: 08/12/16 12:27 AM  Result Value Ref Range Status    Enterococcus species NOT DETECTED NOT DETECTED Final   Listeria monocytogenes NOT DETECTED NOT DETECTED Final   Staphylococcus species DETECTED (A) NOT DETECTED Final    Comment: Methicillin (oxacillin) susceptible coagulase negative staphylococcus. Possible blood culture contaminant (unless isolated from more than one blood culture draw or clinical case suggests pathogenicity). No antibiotic treatment is indicated for blood  culture contaminants. CRITICAL RESULT CALLED TO, READ BACK BY AND VERIFIED WITH: N. [place], AT 3295 08/13/16 BY D. VANHOOK      Staphylococcus aureus NOT DETECTED NOT DETECTED Final   Methicillin resistance NOT DETECTED NOT DETECTED Final   Streptococcus species NOT DETECTED NOT DETECTED Final   Streptococcus agalactiae NOT DETECTED NOT DETECTED Final   Streptococcus pneumoniae NOT DETECTED NOT DETECTED Final   Streptococcus pyogenes NOT DETECTED NOT DETECTED Final   Acinetobacter baumannii NOT DETECTED NOT DETECTED Final   Enterobacteriaceae species NOT DETECTED NOT DETECTED Final   Enterobacter cloacae complex NOT DETECTED NOT DETECTED Final   Escherichia coli NOT DETECTED NOT DETECTED Final   Klebsiella oxytoca NOT DETECTED NOT DETECTED Final   Klebsiella pneumoniae NOT DETECTED NOT DETECTED Final   Proteus species NOT DETECTED NOT DETECTED Final   Serratia marcescens NOT DETECTED NOT DETECTED Final   Haemophilus influenzae NOT DETECTED NOT DETECTED Final   Neisseria meningitidis NOT DETECTED NOT DETECTED Final   Pseudomonas aeruginosa NOT DETECTED NOT DETECTED Final   Candida albicans NOT DETECTED NOT DETECTED Final   Candida glabrata NOT DETECTED NOT DETECTED Final   Candida krusei NOT DETECTED NOT DETECTED Final   Candida parapsilosis NOT DETECTED NOT DETECTED Final   Candida tropicalis NOT DETECTED NOT DETECTED Final    Comment: Performed at [facility], [address], [place], [place] [address]  MRSA PCR Screening     Status: None    Collection Time: 08/12/16  3:40 AM  Result Value Ref Range Status   MRSA by PCR NEGATIVE NEGATIVE Final    Comment:        The GeneXpert MRSA Assay (FDA approved for NASAL specimens only), is one component of a comprehensive MRSA colonization surveillance program. It is not intended to diagnose MRSA infection nor to guide or monitor treatment for MRSA infections.     Time coordinating discharge: 35 minutes  SIGNED:  Chipper Oman, MD  [facility] 08/13/2016, 3:14 PM  Pager please text page via  [email or web address] Password [email or web address]

## 2016-08-14 LAB — CULTURE, BLOOD (ROUTINE X 2): SPECIAL REQUESTS: ADEQUATE

## 2016-08-14 LAB — HEMOGLOBIN A1C
HEMOGLOBIN A1C: 9.6 % — AB (ref 4.8–5.6)
Mean Plasma Glucose: 229 mg/dL

## 2016-08-17 LAB — CULTURE, BLOOD (ROUTINE X 2)
Culture: NO GROWTH
Special Requests: ADEQUATE

## 2016-08-18 LAB — CULTURE, BLOOD (ROUTINE X 2)
CULTURE: NO GROWTH
Culture: NO GROWTH
Special Requests: ADEQUATE
Special Requests: ADEQUATE

## 2016-08-30 ENCOUNTER — Telehealth: Payer: Self-pay | Admitting: General Practice

## 2016-08-30 NOTE — Telephone Encounter (Signed)
Tanzania from [facility] called to see if patient had an appointment scheduled yet. Advised her I did not see any future appointments for this patient. Please call [facility] and advise when appointment is made.

## 2016-09-05 ENCOUNTER — Telehealth: Payer: Self-pay | Admitting: Endocrinology

## 2016-09-05 NOTE — Telephone Encounter (Signed)
Tanzania from [facility] called to inquire about the next appointment for the patient. No further steps need to be taken at this time.

## 2016-09-06 ENCOUNTER — Observation Stay (HOSPITAL_COMMUNITY)
Admission: EM | Admit: 2016-09-06 | Discharge: 2016-09-07 | Disposition: A | Discharge status: Home / Self Care | Payer: BLUE CROSS/BLUE SHIELD | Attending: Internal Medicine | Admitting: Internal Medicine

## 2016-09-06 ENCOUNTER — Encounter (HOSPITAL_COMMUNITY): Payer: Self-pay | Admitting: *Deleted

## 2016-09-06 DIAGNOSIS — I10 Essential (primary) hypertension: Secondary | ICD-10-CM

## 2016-09-06 DIAGNOSIS — D649 Anemia, unspecified: Secondary | ICD-10-CM | POA: Diagnosis not present

## 2016-09-06 DIAGNOSIS — E1110 Type 2 diabetes mellitus with ketoacidosis without coma: Principal | ICD-10-CM | POA: Insufficient documentation

## 2016-09-06 DIAGNOSIS — C773 Secondary and unspecified malignant neoplasm of axilla and upper limb lymph nodes: Secondary | ICD-10-CM | POA: Insufficient documentation

## 2016-09-06 DIAGNOSIS — C4359 Malignant melanoma of other part of trunk: Secondary | ICD-10-CM | POA: Diagnosis not present

## 2016-09-06 DIAGNOSIS — Z794 Long term (current) use of insulin: Secondary | ICD-10-CM | POA: Diagnosis not present

## 2016-09-06 DIAGNOSIS — E876 Hypokalemia: Secondary | ICD-10-CM | POA: Diagnosis not present

## 2016-09-06 DIAGNOSIS — Z79899 Other long term (current) drug therapy: Secondary | ICD-10-CM | POA: Insufficient documentation

## 2016-09-06 DIAGNOSIS — E1140 Type 2 diabetes mellitus with diabetic neuropathy, unspecified: Secondary | ICD-10-CM | POA: Diagnosis not present

## 2016-09-06 HISTORY — DX: Type 2 diabetes mellitus with hyperglycemia: E11.65

## 2016-09-06 HISTORY — DX: Reserved for concepts with insufficient information to code with codable children: IMO0002

## 2016-09-06 HISTORY — DX: Type 2 diabetes mellitus with diabetic neuropathy, unspecified: E11.40

## 2016-09-06 LAB — BLOOD GAS, VENOUS
Acid-base deficit: 15.3 mmol/L — ABNORMAL HIGH (ref 0.0–2.0)
BICARBONATE: 10.4 mmol/L — AB (ref 20.0–28.0)
O2 Saturation: 75.6 %
PATIENT TEMPERATURE: 98.6
PO2 VEN: 45.5 mmHg — AB (ref 32.0–45.0)
pCO2, Ven: 24.9 mmHg — ABNORMAL LOW (ref 44.0–60.0)
pH, Ven: 7.246 — ABNORMAL LOW (ref 7.250–7.430)

## 2016-09-06 LAB — BASIC METABOLIC PANEL
ANION GAP: 14 (ref 5–15)
ANION GAP: 8 (ref 5–15)
BUN: 17 mg/dL (ref 6–20)
BUN: 15 mg/dL (ref 6–20)
CHLORIDE: 107 mmol/L (ref 101–111)
CHLORIDE: 108 mmol/L (ref 101–111)
CO2: 15 mmol/L — ABNORMAL LOW (ref 22–32)
CO2: 18 mmol/L — ABNORMAL LOW (ref 22–32)
Calcium: 8.3 mg/dL — ABNORMAL LOW (ref 8.9–10.3)
Calcium: 8 mg/dL — ABNORMAL LOW (ref 8.9–10.3)
Creatinine, Ser: 0.84 mg/dL (ref 0.61–1.24)
Creatinine, Ser: 0.59 mg/dL — ABNORMAL LOW (ref 0.61–1.24)
GFR calc Af Amer: >60 mL/min (ref >60)
GFR calc Af Amer: >60 mL/min (ref >60)
GFR calc non Af Amer: >60 mL/min (ref >60)
GLUCOSE: 281 mg/dL — AB (ref 65–99)
Glucose, Bld: 156 mg/dL — ABNORMAL HIGH (ref 65–99)
POTASSIUM: 4.1 mmol/L (ref 3.5–5.1)
POTASSIUM: 3.9 mmol/L (ref 3.5–5.1)
Sodium: 136 mmol/L (ref 135–145)
Sodium: 134 mmol/L — ABNORMAL LOW (ref 135–145)

## 2016-09-06 LAB — CBC WITH DIFFERENTIAL/PLATELET
BASOS ABS: 0 10*3/uL (ref 0.0–0.1)
Basophils Relative: 0 %
EOS PCT: 0 %
Eosinophils Absolute: 0 10*3/uL (ref 0.0–0.7)
HCT: 27.6 % — ABNORMAL LOW (ref 39.0–52.0)
HEMOGLOBIN: 9.4 g/dL — AB (ref 13.0–17.0)
LYMPHS PCT: 13 %
Lymphs Abs: 0.8 10*3/uL (ref 0.7–4.0)
MCH: 30.2 pg (ref 26.0–34.0)
MCHC: 34.1 g/dL (ref 30.0–36.0)
MCV: 88.7 fL (ref 78.0–100.0)
Monocytes Absolute: 0.2 10*3/uL (ref 0.1–1.0)
Monocytes Relative: 3 %
NEUTROS ABS: 5 10*3/uL (ref 1.7–7.7)
NEUTROS PCT: 84 %
PLATELETS: 209 10*3/uL (ref 150–400)
RBC: 3.11 MIL/uL — AB (ref 4.22–5.81)
RDW: 13.8 % (ref 11.5–15.5)
WBC: 5.9 10*3/uL (ref 4.0–10.5)

## 2016-09-06 LAB — URINALYSIS, ROUTINE W REFLEX MICROSCOPIC
BILIRUBIN URINE: NEGATIVE
Bacteria, UA: NONE SEEN
Glucose, UA: >=500 mg/dL — AB
HGB URINE DIPSTICK: NEGATIVE
Ketones, ur: 80 mg/dL — AB
LEUKOCYTES UA: NEGATIVE
Nitrite: NEGATIVE
PH: 5 (ref 5.0–8.0)
Protein, ur: NEGATIVE mg/dL
RBC / HPF: NONE SEEN RBC/hpf (ref 0–5)
SPECIFIC GRAVITY, URINE: 1.018 (ref 1.005–1.030)
SQUAMOUS EPITHELIAL / LPF: NONE SEEN

## 2016-09-06 LAB — COMPREHENSIVE METABOLIC PANEL
ALT: 11 U/L — ABNORMAL LOW (ref 17–63)
ANION GAP: 21 — AB (ref 5–15)
AST: 12 U/L — ABNORMAL LOW (ref 15–41)
Albumin: 3 g/dL — ABNORMAL LOW (ref 3.5–5.0)
Alkaline Phosphatase: 51 U/L (ref 38–126)
BILIRUBIN TOTAL: 1.9 mg/dL — AB (ref 0.3–1.2)
BUN: 22 mg/dL — AB (ref 6–20)
CHLORIDE: 102 mmol/L (ref 101–111)
CO2: 10 mmol/L — ABNORMAL LOW (ref 22–32)
Calcium: 8.6 mg/dL — ABNORMAL LOW (ref 8.9–10.3)
Creatinine, Ser: 1.1 mg/dL (ref 0.61–1.24)
GFR calc Af Amer: >60 mL/min (ref >60)
Glucose, Bld: 491 mg/dL — ABNORMAL HIGH (ref 65–99)
POTASSIUM: 4.5 mmol/L (ref 3.5–5.1)
Sodium: 133 mmol/L — ABNORMAL LOW (ref 135–145)
TOTAL PROTEIN: 6.1 g/dL — AB (ref 6.5–8.1)

## 2016-09-06 LAB — IRON AND TIBC
IRON: 21 ug/dL — AB (ref 45–182)
SATURATION RATIOS: 11 % — AB (ref 17.9–39.5)
TIBC: 195 ug/dL — AB (ref 250–450)
UIBC: 174 ug/dL

## 2016-09-06 LAB — MRSA PCR SCREENING: MRSA BY PCR: NEGATIVE

## 2016-09-06 LAB — MAGNESIUM: MAGNESIUM: 1.5 mg/dL — AB (ref 1.7–2.4)

## 2016-09-06 LAB — FOLATE: FOLATE: 9.7 ng/mL (ref >5.9)

## 2016-09-06 LAB — PHOSPHORUS: Phosphorus: 2.5 mg/dL (ref 2.5–4.6)

## 2016-09-06 LAB — GLUCOSE, CAPILLARY
GLUCOSE-CAPILLARY: 184 mg/dL — AB (ref 65–99)
GLUCOSE-CAPILLARY: 157 mg/dL — AB (ref 65–99)
GLUCOSE-CAPILLARY: 128 mg/dL — AB (ref 65–99)
Glucose-Capillary: 256 mg/dL — ABNORMAL HIGH (ref 65–99)
Glucose-Capillary: 196 mg/dL — ABNORMAL HIGH (ref 65–99)

## 2016-09-06 LAB — FERRITIN: Ferritin: 274 ng/mL (ref 24–336)

## 2016-09-06 LAB — TSH: TSH: 0.648 u[IU]/mL (ref 0.350–4.500)

## 2016-09-06 LAB — CBG MONITORING, ED
GLUCOSE-CAPILLARY: 430 mg/dL — AB (ref 65–99)
Glucose-Capillary: 481 mg/dL — ABNORMAL HIGH (ref 65–99)
Glucose-Capillary: 297 mg/dL — ABNORMAL HIGH (ref 65–99)

## 2016-09-06 LAB — VITAMIN B12: Vitamin B-12: 1427 pg/mL — ABNORMAL HIGH (ref 180–914)

## 2016-09-06 MED ORDER — DEXTROSE-NACL 5-0.45 % IV SOLN: INTRAVENOUS | Status: DC

## 2016-09-06 MED ORDER — TRAZODONE HCL 50 MG PO TABS
100.0000 mg | ORAL_TABLET | Freq: Every day | ORAL | Status: DC
  Administered 2016-09-06: 100 mg via ORAL
  Filled 2016-09-06: qty 2

## 2016-09-06 MED ORDER — INSULIN ASPART 100 UNIT/ML ~~LOC~~ SOLN
0.0000 [IU] | Freq: Three times a day (TID) | SUBCUTANEOUS | Status: DC
  Administered 2016-09-07: 3 [IU] via SUBCUTANEOUS

## 2016-09-06 MED ORDER — SODIUM CHLORIDE 0.9 % IV SOLN
INTRAVENOUS | Status: DC
  Administered 2016-09-06: 17:00:00 via INTRAVENOUS

## 2016-09-06 MED ORDER — INSULIN ASPART 100 UNIT/ML ~~LOC~~ SOLN: 0.0000 [IU] | Freq: Every day | SUBCUTANEOUS | Status: DC

## 2016-09-06 MED ORDER — POTASSIUM CHLORIDE 10 MEQ/100ML IV SOLN
10.0000 meq | INTRAVENOUS | Status: AC
  Administered 2016-09-06 (×2): 10 meq via INTRAVENOUS
  Filled 2016-09-06: qty 100

## 2016-09-06 MED ORDER — PREGABALIN 100 MG PO CAPS
200.0000 mg | ORAL_CAPSULE | Freq: Every day | ORAL | Status: DC
  Administered 2016-09-06: 200 mg via ORAL
  Filled 2016-09-06: qty 2

## 2016-09-06 MED ORDER — SODIUM CHLORIDE 0.9 % IV SOLN
INTRAVENOUS | Status: DC
  Administered 2016-09-06: 3.7 [IU]/h via INTRAVENOUS
  Filled 2016-09-06: qty 1

## 2016-09-06 MED ORDER — ENOXAPARIN SODIUM 40 MG/0.4ML ~~LOC~~ SOLN: 40.0000 mg | SUBCUTANEOUS | Status: DC

## 2016-09-06 MED ORDER — SODIUM CHLORIDE 0.9 % IV BOLUS (SEPSIS)
1000.0000 mL | Freq: Once | INTRAVENOUS | Status: AC
  Administered 2016-09-06: 1000 mL via INTRAVENOUS

## 2016-09-06 MED ORDER — DEXTROSE-NACL 5-0.45 % IV SOLN
INTRAVENOUS | Status: DC
  Administered 2016-09-06: 20:00:00 via INTRAVENOUS

## 2016-09-06 MED ORDER — INSULIN DETEMIR 100 UNIT/ML ~~LOC~~ SOLN
10.0000 [IU] | Freq: Every day | SUBCUTANEOUS | Status: DC
  Administered 2016-09-06: 10 [IU] via SUBCUTANEOUS
  Filled 2016-09-06 (×2): qty 0.1

## 2016-09-06 MED ORDER — PANTOPRAZOLE SODIUM 40 MG PO TBEC
40.0000 mg | DELAYED_RELEASE_TABLET | Freq: Every day | ORAL | Status: DC
  Administered 2016-09-07: 40 mg via ORAL
  Filled 2016-09-06: qty 1

## 2016-09-06 MED ORDER — LISINOPRIL 10 MG PO TABS
20.0000 mg | ORAL_TABLET | Freq: Every day | ORAL | Status: DC
  Administered 2016-09-07: 20 mg via ORAL
  Filled 2016-09-06: qty 2

## 2016-09-06 MED ORDER — ACETAMINOPHEN 500 MG PO TABS
500.0000 mg | ORAL_TABLET | Freq: Four times a day (QID) | ORAL | Status: DC | PRN
  Administered 2016-09-06 – 2016-09-07 (×2): 500 mg via ORAL
  Filled 2016-09-06 (×2): qty 1

## 2016-09-06 MED ORDER — SODIUM CHLORIDE 0.9 % IV SOLN
INTRAVENOUS | Status: AC
  Administered 2016-09-06: 17:00:00 via INTRAVENOUS

## 2016-09-06 MED ORDER — ONDANSETRON HCL 4 MG/2ML IJ SOLN
4.0000 mg | Freq: Four times a day (QID) | INTRAMUSCULAR | Status: DC | PRN
  Administered 2016-09-06 – 2016-09-07 (×2): 4 mg via INTRAVENOUS
  Filled 2016-09-06 (×2): qty 2

## 2016-09-06 NOTE — ED Provider Notes (Signed)
Medical screening examination/treatment/procedure(s) were conducted as a shared visit with non-physician practitioner(s) and myself.  I personally evaluated the patient during the encounter. Briefly, the patient is a 61 y.o. male presents with hyperglycemia, abdominal discomfort, emesis found to be in DKA requiring admission for IV hydration and insulin drip. No prodromal infectious symptoms. Patient admitted to hospitalist service for continued management.    EKG Interpretation  Date/Time:  Thursday Sep 06 2016 13:58:47 EDT Ventricular Rate:  74 PR Interval:    QRS Duration: 101 QT Interval:  420 QTC Calculation: 466 R Axis:   97 Text Interpretation:  Right and left arm electrode reversal, interpretation assumes no reversal Sinus rhythm Atrial premature complex Right axis deviation Abnormal R-wave progression, late transition No significant change since last tracing Confirmed by [facility] MD, [place] ([id number]) on 09/06/2016 2:23:33 PM           Fatima Blank, MD 09/06/16 1424

## 2016-09-06 NOTE — ED Notes (Signed)
Bed: WA16 Expected date:  Expected time:  Means of arrival:  Comments: EMS-hyperglycemia 

## 2016-09-06 NOTE — ED Provider Notes (Signed)
[facility] DEPT Provider Note   CSN: [id number] Arrival date & time: 09/06/16  1227  By signing my name below, I, Dora Sims, attest that this documentation has been prepared under the direction and in the presence of [facility], PA-C. Electronically Signed: Dora Sims, Scribe. 09/06/2016. 1:17 PM.  History   Chief Complaint Chief Complaint  Patient presents with  . Hyperglycemia   The history is provided by the patient. No language interpreter was used.    HPI Comments: Jesse Lane is a 61 y.o. male with PMHx including HTN, DM2 with neuropathy prior DKA, and a malignant melanoma of his axilla currently followed by heme/onc undergoing chemotherapy with Keytruda who presents to the Emergency Department via EMS for evaluation of elevated blood glucose measured PTA today. He states he measured his blood glucose this morning where it was 545. He had persistent nausea upon waking this morning but notes his nausea has improved significantly. He did have 2 episodes of emesis. Currently states nausea has resolved. Patient states he measured his blood glucose last night where it was 190 and ate a salad and a few bites of hamburger prior to going to bed. His wife reports that he was diaphoretic upon waking this morning but he has not been diaphoretic throughout the day. He takes metformin, insulin, and Janumet daily for diabetes and has only taken his insulin so far today, but has otherwise been compliant with these medications. No missed doses of medications over the last two weeks until this morning. Patient notes his blood glucose has been fluctuating often over the last several weeks but has not been as high as it was this morning over this time span. Patient reports some persistent urinary frequency and polyuria over the last several weeks. He he has been admitted to the hospital for DKA in the past with the most recent admission on 08/11/16. He started chemotherapy for his axillary  melanoma this past April and his last treatment was one week ago. He has smoked marijuana regularly since age 64. Patient denies rashes, cough, wheezing, acute dyspnea, dysuria, urinary retention, abdominal pain, diarrhea, constipation, blood in stools, fevers, chills, or any other associated symptoms.  Past Medical History:  Diagnosis Date  . Cancer ([place])   . Diabetes mellitus type 2, uncontrolled ([place])   . Diabetic neuropathy ([place])   . History of stomach ulcers   . Hypertension     Patient Active Problem List   Diagnosis Date Noted  . Diabetic neuropathy ([place]) 09/06/2016  . Essential hypertension 09/06/2016  . Normocytic anemia 09/06/2016  . DKA (diabetic ketoacidoses) ([place]) 08/12/2016  . AKI (acute kidney injury) ([place]) 08/12/2016  . Malignant melanoma of axilla ([place]) 08/12/2016  . Incarcerated inguinal hernia 08/16/2015    Past Surgical History:  Procedure Laterality Date  . COLONOSCOPY    . INGUINAL HERNIA REPAIR N/A 08/15/2015   Procedure: HERNIA REPAIR INGUINAL INCARCERATED;  Surgeon: Alphonsa Overall, MD;  Location: [facility] ORS;  Service: General;  Laterality: N/A;  with MESH  . pyloric stenosis    . TONSILLECTOMY         Home Medications    Prior to Admission medications   Medication Sig Start Date End Date Taking? Authorizing Provider  acetaminophen (TYLENOL) 500 MG tablet Take 500 mg by mouth every 6 (six) hours as needed for mild pain or headache.   Yes [provider]  Cholecalciferol (VITAMIN D PO) Take 1 tablet by mouth daily with breakfast.   Yes [provider]  Cyanocobalamin (VITAMIN B-12 PO) Take 2 tablets by mouth daily with breakfast.   Yes [provider]  Insulin Detemir (LEVEMIR) 100 UNIT/ML Pen Inject 7 Units into the skin 2 (two) times daily. 08/13/16  Yes Patrecia Pour, Christean Grief, MD  JANUMET XR [phone] MG TB24 Take 1 tablet by mouth daily. 08/25/16  Yes [provider]  lisinopril (PRINIVIL,ZESTRIL) 20 MG tablet Take 20 mg by  mouth daily with breakfast.  07/24/16  Yes [provider]  LYRICA 100 MG capsule Take 200 mg by mouth at bedtime.  07/27/16  Yes [provider]  metFORMIN (GLUCOPHAGE) 1000 MG tablet Take 1,000 mg by mouth 2 (two) times daily with a meal.   Yes [provider]  Multiple Vitamin (MULTIVITAMIN WITH MINERALS) TABS tablet Take 1 tablet by mouth daily with breakfast.   Yes [provider]  omeprazole (PRILOSEC) 40 MG capsule Take 40 mg by mouth daily with breakfast.    Yes [provider]  ondansetron (ZOFRAN) 4 MG tablet Take 1 tablet (4 mg total) by mouth daily as needed for nausea or vomiting. Patient taking differently: Take 4 mg by mouth daily with breakfast.  08/13/16 08/13/17 Yes Patrecia Pour, Christean Grief, MD  oxyCODONE-acetaminophen (PERCOCET/ROXICET) 5-325 MG tablet Take 1 tablet by mouth at bedtime.   Yes [provider]  traZODone (DESYREL) 100 MG tablet Take 100 mg by mouth at bedtime. 07/24/16  Yes [provider]    Family History Family History  Problem Relation Age of Onset  . Breast cancer Mother   . Bone cancer Mother   . Melanoma Mother   . Prostate cancer Father   . Diabetes Neg Hx     Social History Social History  Substance Use Topics  . Smoking status: Former Smoker    Packs/day: 1.00    Years: 5.00    Types: Cigarettes  . Smokeless tobacco: Never Used  . Alcohol use No     Comment: daily     Allergies   Patient has no known allergies.   Review of Systems Review of Systems  Constitutional: Positive for diaphoresis. Negative for chills and fever.  Respiratory: Negative for cough, shortness of breath and wheezing.   Gastrointestinal: Positive for nausea and vomiting. Negative for abdominal pain, constipation and diarrhea.  Endocrine: Positive for polyuria.  Genitourinary: Positive for frequency. Negative for difficulty urinating and dysuria.  Skin: Negative for rash.  Allergic/Immunologic: Positive for  immunocompromised state.  All other systems reviewed and are negative.  Physical Exam Updated Vital Signs BP 135/81   Pulse 72   Temp 98.1 F (36.7 C) (Rectal)   Resp 17   Ht 5\' 11"  (1.803 m)   Wt 58.1 kg (128 lb)   SpO2 100%   BMI 17.85 kg/m   Physical Exam  Constitutional: He is oriented to person, place, and time. He appears well-developed and well-nourished. No distress.  HENT:  Head: Normocephalic and atraumatic.  Cardiovascular: Normal rate, regular rhythm and normal heart sounds.   No murmur heard. Pulmonary/Chest: Effort normal and breath sounds normal. No respiratory distress.  Abdominal: Soft. He exhibits no distension. There is no tenderness.  Musculoskeletal: He exhibits no edema.  Neurological: He is alert and oriented to person, place, and time.  Skin: Skin is warm and dry.  Nursing note and vitals reviewed.  ED Treatments / Results  Labs (all labs ordered are listed, but only abnormal results are displayed) Labs Reviewed  COMPREHENSIVE METABOLIC PANEL - Abnormal; Notable for the following:  Result Value   Sodium 133 (*)    CO2 10 (*)    Glucose, Bld 491 (*)    BUN 22 (*)    Calcium 8.6 (*)    Total Protein 6.1 (*)    Albumin 3.0 (*)    AST 12 (*)    ALT 11 (*)    Total Bilirubin 1.9 (*)    Anion gap 21 (*)    All other components within normal limits  CBC WITH DIFFERENTIAL/PLATELET - Abnormal; Notable for the following:    RBC 3.11 (*)    Hemoglobin 9.4 (*)    HCT 27.6 (*)    All other components within normal limits  URINALYSIS, ROUTINE W REFLEX MICROSCOPIC - Abnormal; Notable for the following:    Color, Urine STRAW (*)    Glucose, UA >=500 (*)    Ketones, ur 80 (*)    All other components within normal limits  BLOOD GAS, VENOUS - Abnormal; Notable for the following:    pH, Ven 7.246 (*)    pCO2, Ven 24.9 (*)    pO2, Ven 45.5 (*)    Bicarbonate 10.4 (*)    Acid-base deficit 15.3 (*)    All other components within normal limits  CBG  MONITORING, ED - Abnormal; Notable for the following:    Glucose-Capillary 481 (*)    All other components within normal limits  CBG MONITORING, ED - Abnormal; Notable for the following:    Glucose-Capillary 430 (*)    All other components within normal limits  CBG MONITORING, ED - Abnormal; Notable for the following:    Glucose-Capillary 297 (*)    All other components within normal limits    EKG  EKG Interpretation  Date/Time:  Thursday Sep 06 2016 13:58:47 EDT Ventricular Rate:  74 PR Interval:    QRS Duration: 101 QT Interval:  420 QTC Calculation: 466 R Axis:   97 Text Interpretation:  Right and left arm electrode reversal, interpretation assumes no reversal Sinus rhythm Atrial premature complex Right axis deviation Abnormal R-wave progression, late transition No significant change since last tracing Confirmed by [facility] MD, PEDRO ([id number]) on 09/06/2016 2:23:33 PM Also confirmed by [facility] MD, [place] [phone]), editor Nelly Laurence, [place] ([id number])  on 09/06/2016 2:55:28 PM       Radiology No results found.  Procedures Procedures (including critical care time)  CRITICAL CARE Performed by: Ozella Almond Johnathyn Viscomi   Total critical care time: 35 minutes  Critical care time was exclusive of separately billable procedures and treating other patients.  Critical care was necessary to treat or prevent imminent or life-threatening deterioration.  Critical care was time spent personally by me on the following activities: development of treatment plan with patient and/or surrogate as well as nursing, discussions with consultants, evaluation of patient's response to treatment, examination of patient, obtaining history from patient or surrogate, ordering and performing treatments and interventions, ordering and review of laboratory studies, ordering and review of radiographic studies, pulse oximetry and re-evaluation of patient's condition.   DIAGNOSTIC STUDIES: Oxygen Saturation is 99% on  RA, normal by my interpretation.    COORDINATION OF CARE: 1:14 PM Discussed treatment plan with pt at bedside and pt agreed to plan.  Medications Ordered in ED Medications  dextrose 5 %-0.45 % sodium chloride infusion (not administered)  insulin regular (NOVOLIN R,HUMULIN R) 100 Units in sodium chloride 0.9 % 100 mL (1 Units/mL) infusion (2.4 Units/hr Intravenous Rate/Dose Change 09/06/16 1616)  sodium chloride 0.9 % bolus 1,000 mL (1,000 mLs  Intravenous New Bag/Given 09/06/16 1255)  sodium chloride 0.9 % bolus 1,000 mL (1,000 mLs Intravenous New Bag/Given 09/06/16 1426)     Initial Impression / Assessment and Plan / ED Course  I have reviewed the triage vital signs and the nursing notes.  Pertinent labs & imaging results that were available during my care of the patient were reviewed by me and considered in my medical decision making (see chart for details).    Jesse Lane is a 61 y.o. male who presents to ED for hyperglycemia. CBG of 481 upon arrival. Fluid bolus initiated. CMP with co2 of 10 and anion gap of 21. K+ wdl. UA with Ketones. Patient in DKA. Started on [employer] and admitted to hospitalist service.   Patient seen by and discussed with Dr. Leonette Monarch who agrees with treatment plan.   Final Clinical Impressions(s) / ED Diagnoses   Final diagnoses:  Diabetic ketoacidosis without coma associated with type 2 diabetes mellitus ([place])    New Prescriptions New Prescriptions   No medications on file   I personally performed the services described in this documentation, which was scribed in my presence. The recorded information has been reviewed and is accurate.    Kiandra Sanguinetti, Ozella Almond, PA-C 09/06/16 [phone]

## 2016-09-06 NOTE — ED Triage Notes (Signed)
EMS called to home.  Found patient with complaints of hyperglycemia, nausea, and vomiting.  Last CBG 565 per EMS.  Currently patient denies any pain.

## 2016-09-06 NOTE — H&P (Signed)
History and Physical    Jesse Lane KNL:[id number] DOB: December 30, 1955 DOA: 09/06/2016  Referring MD/NP/PA: York Cerise Ward PCP: Annetta Maw, MD  Outpatient Specialists: [facility] hematology/oncology Patient coming from: home  Chief Complaint: high blood sugars, vomiting  HPI: Jesse Lane is a 61 y.o. male with history of metastatic melanoma without known primary, metastatic to right axilla, mesenteric lymph nodes and large mesenteric mass being treated with Marcelino Freestone at [facility] Hem/Onc with good response, diabetes mellitus type 2, uncontrolled, diabetic neuropathy, peptic ulcer disease, and essential hypertension who presents with high blood sugars.  He was hospitalized with the same from 5/6-08/13/16.  Since discharge, his blood sugars have remained elevated in the 200-500 range despite reported compliance with medications.  Had some dietary indiscretions on [birth year] for his birthday but has been trying to adhere to low carb diet since.  For the last 2-3 days, his blood sugars have been higher than usual, up to the 500s with heaves.   This morning, he had some vomiting and felt worse so he came to the ER.  He denies fevers, chills, worsening cough, dysuria, diarrhea or signs of infection.  He denies chest pains and tightness.    ED Course: Vital signs stable.  Glucose 491. CO2 10, anion gap 21.  He was given 3L of IVF and started on an insulin gtt and is being admitted to the stepdown unit for further management.    Review of Systems:  General:  Denies fevers, chills, weight loss or gain.  Poor appetite, early satiety HEENT:  Denies changes to hearing and vision, rhinorrhea, sinus congestion, sore throat CV:  Denies chest pain and palpitations, lower extremity edema.  PULM:  Denies SOB, wheezing, cough.   GI:  Denies constipation, diarrhea.   GU:  Denies dysuria, frequency, urgency ENDO:  Endorses polyuria, polydipsia (feels thirsty) HEME:  Denies hematemesis, blood in stools, melena, abnormal bruising  or bleeding.  LYMPH:  Denies lymphadenopathy.  Masses have been shrinking MSK:  Denies arthralgias, myalgias.   DERM:  Denies skin rash or ulcer.   NEURO:  Denies focal numbness, weakness, slurred speech, confusion, facial droop.  PSYCH:  Denies anxiety and depression.    Past Medical History:  Diagnosis Date  . Cancer ([place])   . Diabetes mellitus   . History of stomach ulcers   . Hypertension     Past Surgical History:  Procedure Laterality Date  . COLONOSCOPY    . INGUINAL HERNIA REPAIR N/A 08/15/2015   Procedure: HERNIA REPAIR INGUINAL INCARCERATED;  Surgeon: Alphonsa Overall, MD;  Location: [facility] ORS;  Service: General;  Laterality: N/A;  with MESH  . pyloric stenosis    . TONSILLECTOMY       reports that he has never smoked. He has never used smokeless tobacco. He reports that he uses drugs, including Marijuana. He reports that he does not drink alcohol.  No Known Allergies  History reviewed. No pertinent family history.  Prior to Admission medications   Medication Sig Start Date End Date Taking? Authorizing Provider  acetaminophen (TYLENOL) 500 MG tablet Take 500 mg by mouth every 6 (six) hours as needed for mild pain or headache.    [provider]  Insulin Detemir (LEVEMIR) 100 UNIT/ML Pen Inject 7 Units into the skin 2 (two) times daily. 08/13/16   Doreatha Lew, MD  lisinopril (PRINIVIL,ZESTRIL) 20 MG tablet Take 20 mg by mouth daily. 07/24/16   [provider]  LYRICA 100 MG capsule Take 100 mg by mouth 3 (  three) times daily. 07/27/16   [provider]  metFORMIN (GLUCOPHAGE) 1000 MG tablet Take 1,000 mg by mouth 2 (two) times daily with a meal.    [provider]  omeprazole (PRILOSEC) 40 MG capsule Take 40 mg by mouth daily.    [provider]  ondansetron (ZOFRAN) 4 MG tablet Take 1 tablet (4 mg total) by mouth daily as needed for nausea or vomiting. 08/13/16 08/13/17  Doreatha Lew, MD  traZODone (DESYREL) 100 MG tablet Take  100 mg by mouth at bedtime. 07/24/16   [provider]    Physical Exam: Vitals:   09/06/16 1232 09/06/16 1238  BP:  135/83  Pulse:  76  Resp:  18  Temp:  97.3 F (36.3 C)  TempSrc:  Oral  SpO2:  99%  Weight: 58.1 kg (128 lb)   Height: 5\' 11"  (1.803 m)    Constitutional: thin with temporal wasting, NAD, calm, comfortable Eyes: PERRL, lids and conjunctivae normal ENMT: Mucous membranes are moist. Posterior pharynx clear of any exudate or lesions.Normal dentition.  Neck: normal, supple, no masses, no thyromegaly Respiratory: clear to auscultation bilaterally, no wheezing, no crackles. Normal respiratory effort. No accessory muscle use.  Cardiovascular: Regular rate and rhythm, no murmurs / rubs / gallops. No extremity edema. 2+ pedal pulses. No carotid bruits.  Abdomen: no tenderness, no masses palpated. No hepatosplenomegaly. Bowel sounds positive.  Musculoskeletal: no clubbing / cyanosis. No joint deformity upper and lower extremities. Good ROM, no contractures. Normal muscle tone.  Skin: small ecchymotic or hyperpigmented lesions on bottom lip, dry skin and lips.  no ulcers. No induration Neurologic: CN 2-12 grossly intact. Sensation intact, DTR normal. Strength 5/5 in all 4.  Psychiatric: Normal judgment and insight. Alert and oriented x 3. Normal mood.   Labs on Admission: I have personally reviewed following labs and imaging studies  CBC:  Recent Labs Lab 09/06/16 1243  WBC 5.9  NEUTROABS 5.0  HGB 9.4*  HCT 27.6*  MCV 88.7  PLT 734   Basic Metabolic Panel:  Recent Labs Lab 09/06/16 1243  NA 133*  K 4.5  CL 102  CO2 10*  GLUCOSE 491*  BUN 22*  CREATININE 1.10  CALCIUM 8.6*   GFR: Estimated Creatinine Clearance: 57.2 mL/min (by C-G formula based on SCr of 1.1 mg/dL). Liver Function Tests:  Recent Labs Lab 09/06/16 1243  AST 12*  ALT 11*  ALKPHOS 51  BILITOT 1.9*  PROT 6.1*  ALBUMIN 3.0*   No results for input(s): LIPASE, AMYLASE in the  last 168 hours. No results for input(s): AMMONIA in the last 168 hours. Coagulation Profile: No results for input(s): INR, PROTIME in the last 168 hours. Cardiac Enzymes: No results for input(s): CKTOTAL, CKMB, CKMBINDEX, TROPONINI in the last 168 hours. BNP (last 3 results) No results for input(s): PROBNP in the last 8760 hours. HbA1C: No results for input(s): HGBA1C in the last 72 hours. CBG:  Recent Labs Lab 09/06/16 1236 09/06/16 1324  GLUCAP 481* 430*   Lipid Profile: No results for input(s): CHOL, HDL, LDLCALC, TRIG, CHOLHDL, LDLDIRECT in the last 72 hours. Thyroid Function Tests: No results for input(s): TSH, T4TOTAL, FREET4, T3FREE, THYROIDAB in the last 72 hours. Anemia Panel: No results for input(s): VITAMINB12, FOLATE, FERRITIN, TIBC, IRON, RETICCTPCT in the last 72 hours. Urine analysis:    Component Value Date/Time   COLORURINE YELLOW 12/05/2011 [place] 12/05/2011 1655   LABSPEC 1.039 (H) 12/05/2011 1655   PHURINE 5.5 12/05/2011 1655  GLUCOSEU >1000 (A) 12/05/2011 1655   HGBUR NEGATIVE 12/05/2011 1655   BILIRUBINUR SMALL (A) 12/05/2011 1655   KETONESUR >80 (A) 12/05/2011 1655   PROTEINUR 30 (A) 12/05/2011 1655   UROBILINOGEN 0.2 12/05/2011 1655   NITRITE NEGATIVE 12/05/2011 1655   LEUKOCYTESUR NEGATIVE 12/05/2011 1655   Sepsis Labs: @LABRCNTIP (procalcitonin:4,lacticidven:4) )No results found for this or any previous visit (from the past 240 hour(s)).   Radiological Exams on Admission: No results found.  EKG: Independently reviewed. Sinus rhythm with PAC, no acute ischemic changes  Assessment/Plan Active Problems:   DKA (diabetic ketoacidoses) ([place])   DKA in setting of known diabetes mellitus type 2, uncontrolled.  He has not been eating well and has been persistently hyperglycemic and may have gradually slipped into DKA.  Perhaps his Marcelino Freestone affects his pancreatic function.  No obvious symptoms of infection or myocardial  ischemia. -  A1c 9.6 on 5/7, no need to repeat -  Glucose stabilizer -  BMP q4h -  Hourly CBG -  Continue insulin gtt until gap closed and bicarb close to 20 -  Check potassium, magnesium, and phosphorus -  Nutrition consultation -  Diabetic educator -  Suspect he will need higher doses of insulin at discharge -  zofran prn  Diabetic neuropathy, stable -  Continue lyrica  Essential hypertension, blood pressures stable -  Continue lisinopril  PUD, stable -  Continue PPI  Metastatic melanoma to right axilla and mesentery, responding to [facility] -  F/u with [facility] Heme/Onc  Normocytic anemia, likely due to chronic disease, previous hemoglobin of 13, down to 9.4 at admission prior to IVF. -  Iron studies, B12, folate -  TSH -  Occult stool -  Repeat hgb in AM  DVT prophylaxis: lovenox  Code Status: full Family Communication: patient and his girlfriend  Disposition Plan:  Likely home in 1-2 days  Consults called: none  Admission status: observation, stepdown.  At risk of decompensation from underlying malignancy and uncontrolled diabetes, but suspect he will improve in 24-48 hours.    Janece Canterbury MD [facility] Pager [phone]  If 7PM-7AM, please contact night-coverage [email or web address] Password [email or web address]  09/06/2016, 2:19 PM

## 2016-09-06 NOTE — ED Notes (Signed)
Report given to [facility] on 2W, Pt can go upstairs at 1609

## 2016-09-07 DIAGNOSIS — E1110 Type 2 diabetes mellitus with ketoacidosis without coma: Secondary | ICD-10-CM | POA: Diagnosis not present

## 2016-09-07 DIAGNOSIS — E876 Hypokalemia: Secondary | ICD-10-CM | POA: Diagnosis not present

## 2016-09-07 DIAGNOSIS — I10 Essential (primary) hypertension: Secondary | ICD-10-CM | POA: Diagnosis not present

## 2016-09-07 DIAGNOSIS — C4359 Malignant melanoma of other part of trunk: Secondary | ICD-10-CM | POA: Diagnosis not present

## 2016-09-07 DIAGNOSIS — E1149 Type 2 diabetes mellitus with other diabetic neurological complication: Secondary | ICD-10-CM

## 2016-09-07 LAB — BASIC METABOLIC PANEL
ANION GAP: 6 (ref 5–15)
BUN: 11 mg/dL (ref 6–20)
CALCIUM: 8.2 mg/dL — AB (ref 8.9–10.3)
CO2: 21 mmol/L — ABNORMAL LOW (ref 22–32)
Chloride: 110 mmol/L (ref 101–111)
Creatinine, Ser: 0.45 mg/dL — ABNORMAL LOW (ref 0.61–1.24)
GFR calc Af Amer: >60 mL/min (ref >60)
GLUCOSE: 86 mg/dL (ref 65–99)
Potassium: 3.3 mmol/L — ABNORMAL LOW (ref 3.5–5.1)
Sodium: 137 mmol/L (ref 135–145)

## 2016-09-07 LAB — GLUCOSE, CAPILLARY
GLUCOSE-CAPILLARY: 116 mg/dL — AB (ref 65–99)
GLUCOSE-CAPILLARY: 161 mg/dL — AB (ref 65–99)
Glucose-Capillary: 101 mg/dL — ABNORMAL HIGH (ref 65–99)
Glucose-Capillary: 86 mg/dL (ref 65–99)

## 2016-09-07 LAB — CBC
HCT: 25.9 % — ABNORMAL LOW (ref 39.0–52.0)
Hemoglobin: 9.1 g/dL — ABNORMAL LOW (ref 13.0–17.0)
MCH: 30.1 pg (ref 26.0–34.0)
MCHC: 35.1 g/dL (ref 30.0–36.0)
MCV: 85.8 fL (ref 78.0–100.0)
PLATELETS: 180 10*3/uL (ref 150–400)
RBC: 3.02 MIL/uL — ABNORMAL LOW (ref 4.22–5.81)
RDW: 13.6 % (ref 11.5–15.5)
WBC: 3.7 10*3/uL — AB (ref 4.0–10.5)

## 2016-09-07 MED ORDER — INSULIN DETEMIR 100 UNIT/ML FLEXPEN: 12.0000 [IU] | PEN_INJECTOR | Freq: Two times a day (BID) | SUBCUTANEOUS | 1 refills | Status: DC

## 2016-09-07 MED ORDER — INSULIN ASPART 100 UNIT/ML FLEXPEN: 6.0000 [IU] | PEN_INJECTOR | Freq: Three times a day (TID) | SUBCUTANEOUS | 2 refills | Status: DC

## 2016-09-07 MED ORDER — POTASSIUM CHLORIDE CRYS ER 20 MEQ PO TBCR
40.0000 meq | EXTENDED_RELEASE_TABLET | ORAL | Status: DC
  Administered 2016-09-07: 40 meq via ORAL
  Filled 2016-09-07: qty 2

## 2016-09-07 MED ORDER — SODIUM CHLORIDE 0.9 % IV SOLN
INTRAVENOUS | Status: DC
  Administered 2016-09-07: 08:00:00 via INTRAVENOUS

## 2016-09-07 MED ORDER — ALUM & MAG HYDROXIDE-SIMETH 200-200-20 MG/5ML PO SUSP
30.0000 mL | ORAL | Status: DC | PRN
  Administered 2016-09-07: 30 mL via ORAL
  Filled 2016-09-07: qty 30

## 2016-09-07 NOTE — Care Management Note (Signed)
Case Management Note  Patient Details  Name: Jesse Lane MRN: [id number] Date of Birth: [birth year]  Subjective/Objective:                  61 y.o. male with history of metastatic melanoma without known primary, metastatic to right axilla, mesenteric lymph nodes and large mesenteric mass being treated with Marcelino Freestone at [facility] Hem/Onc with good response, diabetes mellitus type 2, uncontrolled, diabetic neuropathy, peptic ulcer disease, and essential hypertension who presents with high blood sugars.  He was hospitalized with the same from 5/6-08/13/16.  Since discharge, his blood sugars have remained elevated in the 200-500 range despite reported compliance with medications.  Had some dietary indiscretions on [birth year] for his birthday but has been trying to adhere to low carb diet since.  For the last 2-3 days, his blood sugars have been higher than usual, up to the 500s with heaves.   This morning, he had some vomiting and felt worse so he came to the ER.  He denies fevers, chills, worsening cough, dysuria, diarrhea or signs of infection.  He denies chest pains and tightness.    ED Course: Vital signs stable.  Glucose 491. CO2 10, anion gap 21.  He was given 3L of IVF and started on an insulin gtt and is being admitted to the stepdown unit for further management.   Action/Plan: Date:  September 07, 2016  Chart reviewed for concurrent status and case management needs.  Will continue to follow patient progress.  Discharge Planning: following for needs  Expected discharge date: [id number]  Velva Harman, BSN, [place], [place]   Expected Discharge Date:   (unknown)               Expected Discharge Plan:  Home/Self Care  In-House Referral:     Discharge planning Services  CM Consult  Post Acute Care Choice:    Choice offered to:     DME Arranged:    DME Agency:     HH Arranged:    [place] Agency:     Status of Service:  In process, will continue to follow  If discussed at Long Length of Stay  Meetings, dates discussed:    Additional Comments:  Leeroy Cha, RN 09/07/2016, 9:27 AM

## 2016-09-07 NOTE — Progress Notes (Signed)
Inpatient Diabetes Program Recommendations  AACE/ADA: New Consensus Statement on Inpatient Glycemic Control (2015)  Target Ranges:  Prepandial:   less than 140 mg/dL      Peak postprandial:   less than 180 mg/dL (1-2 hours)      Critically ill patients:  140 - 180 mg/dL   Lab Results  Component Value Date   GLUCAP 86 09/07/2016   HGBA1C 9.6 (H) 08/13/2016    Review of Glycemic Control  Diabetes history: DM2 Outpatient Diabetes medications: Levemir 7 units bid, Janumet 100/1000 mg QD, metformin 1000 mg bid Current orders for Inpatient glycemic control: Levemir 10 units QHS, Novolog 0-15 units tidwc  Spoke with pt regarding his diabetes management at home. Pt states he checks his blood sugars several times/day and blood sugars are always elevated. Concern for high blood sugars with chemo. His MD has retired and will be seeing another endo.  To be discharged on Levemir and Novolog. Will d/c metformin and Janumet. Encouraged pt to eat healthy diet, to start riding stationary bike, and take insulin as prescribed. To f/u with PCP in mid-July. Discussed with MD and RN.  Thank you. Lorenda Peck, RD, LDN, CDE Inpatient Diabetes Coordinator [phone]

## 2016-09-07 NOTE — Discharge Summary (Signed)
Physician Discharge Summary  Jesse Lane IRC:[id number] DOB: 1955-10-31 DOA: 09/06/2016  PCP: Jesse Bombard, PA  Admit date: 09/06/2016 Discharge date: 09/07/2016  Time spent: 35 minutes  Recommendations for Outpatient Follow-up:  1. Please follow CBG and adjust hypoglycemic regimen further as needed. 2. Repeat BMET to follow electrolytes and renal function  3. Repeat CBC to follow Hgb trend    Discharge Diagnoses:  Active Problems:   DKA (diabetic ketoacidoses) (HCC)   Malignant melanoma of axilla (HCC)   Diabetic neuropathy ([place])   Essential hypertension   Normocytic anemia   Hypokalemia   Discharge Condition: stable and improved. Discharge home with instructions to take medications as prescribed (now only on insulin therapy), to keep himself hydrated and to follow modified carbohydrates diet. Patient to follow up with PCP and with endocrinologist as previously arranged.   Diet recommendation: modified carb diet and heart healthy diet   Filed Weights   09/06/16 1232 09/06/16 1600  Weight: 58.1 kg (128 lb) 58.7 kg (129 lb 6.6 oz)    History of present illness:  As per H&P written by Dr. Sheran Fava on 09/06/16 61 y.o. male with history of metastatic melanoma without known primary, metastatic to right axilla, mesenteric lymph nodes and large mesenteric mass being treated with Marcelino Freestone at [facility] Hem[facility]Onc with good response, diabetes mellitus type 2, uncontrolled, diabetic neuropathy, peptic ulcer disease, and essential hypertension who presents with high blood sugars.  He was hospitalized with the same from 5/6-08/13/16.  Since discharge, his blood sugars have remained elevated in the 200-500 range despite reported compliance with medications.  Had some dietary indiscretions on [birth year] but has been trying to adhere to low carb diet since.  For the last 2-3 days, his blood sugars have been higher than usual, up to the 500s with heaves.   This morning, he had some vomiting and  felt worse so he came to the ER.  He denies fevers, chills, worsening cough, dysuria, diarrhea or signs of infection.  He denies chest pains and tightness.    Hospital Course:  DKA in setting of known diabetes mellitus type 2, uncontrolled.  He has not been eating well and has been persistently hyperglycemic and may have gradually slipped into DKA.  Perhaps his Marcelino Freestone affects his pancreatic function.  No obvious symptoms of infection or myocardial ischemia. -A1c 9.6 on 5/7 -patient DKA resolved after IVF's resuscitation and IV insulin therapy -electrolytes repleted and WNL at discharge -patient oral hypoglycemic regimen discontinued -patient discharge on adjusted dose of lantus and novolog TID -patient advise to be compliant with modified carb diet, new medication regimen and to follow up with endocrinology service and previously arranged.  Diabetic neuropathy -Stable -Will continue lyrica  Essential hypertension -Blood pressures stable -Continue lisinopril -advise to follow heart healthy diet   PUD  -Stable and not complaining of hematemesis or melena -Continue PPI  Metastatic melanoma to right axilla and mesentery, responding to [facility] -patient will continue F/u with [facility]On[facility] -overall appears to be stable and responding   Normocytic anemia, likely due to chronic disease, previous hemoglobin of 13, down to 9.4 at admission prior to IVF. -no signs of acute bleeding -folate and B12 WNL -iron level slightly below norma; but ferritin 274 -normal TSH -repeat CBC at follow up to assess Hgb trend   Hypokalemia -most likely due to insulin therapy -repleted and discharge on lisinopril -will recommend BMET at follow up to assess electrolytes trend   Procedures:  See below for  x-ray reports   Consultations:  Diabetes coordinator   Discharge Exam: Vitals:   09/07/16 1000 09/07/16 1100  BP: (!) 143/77 (!) 158/79  Pulse: 77 71  Resp: 17 14  Temp:      General:  afebrile, no CP, no SOB. Patient feeling much better overall. No nausea, no vomiting and tolerating diet. CBG's stable and DKA resolved at discharge. Cardiovascular: S1 and S2, no rubs, no gallops Respiratory: CTA bilaterally Abd: soft, NT, ND, positive BS Extremities: no edema or cyanosis   Discharge Instructions   Discharge Instructions    Diet - low sodium heart healthy    Complete by:  As directed    Diet Carb Modified    Complete by:  As directed    Discharge instructions    Complete by:  As directed    Take medications as prescribed Follow a modified carbohydrates diet and avoid skipping meals Arrange follow up with PCP in 10 days and with endocrinologist as already scheduled Your metformin and janumet has been discontinued Keep yourself well hydrated     Current Discharge Medication List    START taking these medications   Details  insulin aspart (NOVOLOG FLEXPEN) 100 UNIT/ML FlexPen Inject 6 Units into the skin 3 (three) times daily with meals. Qty: 15 mL, Refills: 2      CONTINUE these medications which have CHANGED   Details  Insulin Detemir (LEVEMIR) 100 UNIT/ML Pen Inject 12 Units into the skin 2 (two) times daily. Qty: 15 mL, Refills: 1      CONTINUE these medications which have NOT CHANGED   Details  acetaminophen (TYLENOL) 500 MG tablet Take 500 mg by mouth every 6 (six) hours as needed for mild pain or headache.    Cholecalciferol (VITAMIN D PO) Take 1 tablet by mouth daily with breakfast.    Cyanocobalamin (VITAMIN B-12 PO) Take 2 tablets by mouth daily with breakfast.    lisinopril (PRINIVIL,ZESTRIL) 20 MG tablet Take 20 mg by mouth daily with breakfast.  Refills: 0    LYRICA 100 MG capsule Take 200 mg by mouth at bedtime.  Refills: 0    Multiple Vitamin (MULTIVITAMIN WITH MINERALS) TABS tablet Take 1 tablet by mouth daily with breakfast.    omeprazole (PRILOSEC) 40 MG capsule Take 40 mg by mouth daily with breakfast.     ondansetron (ZOFRAN) 4  MG tablet Take 1 tablet (4 mg total) by mouth daily as needed for nausea or vomiting. Qty: 30 tablet, Refills: 1    oxyCODONE-acetaminophen (PERCOCET/ROXICET) 5-325 MG tablet Take 1 tablet by mouth at bedtime.    traZODone (DESYREL) 100 MG tablet Take 100 mg by mouth at bedtime. Refills: 0      STOP taking these medications     JANUMET XR [id number] MG TB24      metFORMIN (GLUCOPHAGE) 1000 MG tablet        No Known Allergies Follow-up Information    Jesse Bombard, PA. Schedule an appointment as soon as possible for a visit in 10 day(s).   Specialty:  Physician Assistant Contact information: [address] [place] [address] [phone]            The results of significant diagnostics from this hospitalization (including imaging, microbiology, ancillary and laboratory) are listed below for reference.    Significant Diagnostic Studies: Dg Chest 2 View  Result Date: 08/12/2016 CLINICAL DATA:  Hyperglycemia.  Generalized weakness and fever. EXAM: CHEST  2 VIEW COMPARISON:  06/25/2016 FINDINGS: The lungs are  clear. The pulmonary vasculature is normal. Heart size is normal. Hilar and mediastinal contours are unremarkable. There is no pleural effusion. IMPRESSION: No active cardiopulmonary disease. Electronically Signed   By: Andreas Newport M.D.   On: 08/12/2016 00:58    Microbiology: Recent Results (from the past 240 hour(s))  MRSA PCR Screening     Status: None   Collection Time: 09/06/16  4:00 PM  Result Value Ref Range Status   MRSA by PCR NEGATIVE NEGATIVE Final    Comment:        The GeneXpert MRSA Assay (FDA approved for NASAL specimens only), is one component of a comprehensive MRSA colonization surveillance program. It is not intended to diagnose MRSA infection nor to guide or monitor treatment for MRSA infections.      Labs: Basic Metabolic Panel:  Recent Labs Lab 09/06/16 1243 09/06/16 1659 09/06/16 2037 09/07/16 0332  NA 133* 136 134*  137  K 4.5 4.1 3.9 3.3*  CL 102 107 108 110  CO2 10* 15* 18* 21*  GLUCOSE 491* 281* 156* 86  BUN 22* 17 15 11   CREATININE 1.10 0.84 0.59* 0.45*  CALCIUM 8.6* 8.3* 8.0* 8.2*  MG  --  1.5*  --   --   PHOS  --  2.5  --   --    Liver Function Tests:  Recent Labs Lab 09/06/16 1243  AST 12*  ALT 11*  ALKPHOS 51  BILITOT 1.9*  PROT 6.1*  ALBUMIN 3.0*   No results for input(s): LIPASE, AMYLASE in the last 168 hours. No results for input(s): AMMONIA in the last 168 hours. CBC:  Recent Labs Lab 09/06/16 1243 09/07/16 0332  WBC 5.9 3.7*  NEUTROABS 5.0  --   HGB 9.4* 9.1*  HCT 27.6* 25.9*  MCV 88.7 85.8  PLT 209 180   Cardiac Enzymes: No results for input(s): CKTOTAL, CKMB, CKMBINDEX, TROPONINI in the last 168 hours. BNP: BNP (last 3 results) No results for input(s): BNP in the last 8760 hours.  ProBNP (last 3 results) No results for input(s): PROBNP in the last 8760 hours.  CBG:  Recent Labs Lab 09/06/16 2213 09/06/16 2319 09/07/16 0023 09/07/16 0810 09/07/16 1208  GLUCAP 128* 116* 101* 86 161*       Signed:  Barton Dubois MD.  [facility] 09/07/2016, 12:34 PM

## 2016-09-07 NOTE — Progress Notes (Signed)
IV removed. Discharge instructions discussed with patient and significant other; All questions answered. Pt wheeled down by nurse tech at 1545.

## 2016-09-07 NOTE — Progress Notes (Signed)
Initial Nutrition Assessment  DOCUMENTATION CODES:   Severe malnutrition in context of chronic illness, Underweight  INTERVENTION:    Glucerna Shake po TID, each supplement provides 220 kcal and 10 grams of protein  NUTRITION DIAGNOSIS:   Malnutrition (severe) related to  (uncontrolled DM, metastatic melanoma) as evidenced by severe depletion of body fat, severe depletion of muscle mass  GOAL:   Patient will meet greater than or equal to 90% of their needs  MONITOR:   PO intake, Supplement acceptance, Labs, Weight trends, Skin, I & O's  REASON FOR ASSESSMENT:   Consult Assessment of nutrition requirement/status  ASSESSMENT:   61 y.o. Male with history of metastatic melanoma without known primary, metastatic to right axilla, mesenteric lymph nodes and large mesenteric mass being treated with Marcelino Freestone at [facility] with good response, diabetes mellitus type 2, uncontrolled, diabetic neuropathy, peptic ulcer disease, and essential hypertension who presents with high blood sugars.  Pt admitted with DKA. Sleeping upon RD visit.  Attempt to wake x 2. PO intake 100% at breakfast per flowsheet records. Per weight readings below, pt has had an 8% weight loss x 1 year.  Not significant for time frame.  Would benefit from addition of oral nutrition supplement during acute hospitalization.   Medications reviewed.  + IV hydration and insulin drip. Labs reviewed.  Potassium 3.3 (L). CBG's [id number].  Nutrition-Focused physical exam completed. Findings are severe fat depletion, severe muscle depletion, and no edema.  Diet Order:  Diet Carb Modified Fluid consistency: Thin; Room service appropriate? Yes Diet - low sodium heart healthy Diet Carb Modified  Skin:  Reviewed, no issues  Last BM:  N/A  Height:   Ht Readings from Last 1 Encounters:  09/06/16 5\' 11"  (1.803 m)   Weight:   Wt Readings from Last 1 Encounters:  09/06/16 129 lb 6.6 oz (58.7 kg)   Wt Readings from  Last 10 Encounters:  09/06/16 129 lb 6.6 oz (58.7 kg)  08/12/16 122 lb 5.7 oz (55.5 kg)  08/15/15 140 lb (63.5 kg)  12/05/11 185 lb (83.9 kg)   Ideal Body Weight:  78.1 kg  BMI:  Body mass index is 18.05 kg/m.  Estimated Nutritional Needs:   Kcal:  1800-2000  Protein:  90-100 gm  Fluid:  1.8-2.0 L  EDUCATION NEEDS:   No education needs identified at this time  Arthur Holms, RD, LDN Pager #: [phone] After-Hours Pager #: [phone]

## 2016-09-16 ENCOUNTER — Encounter (HOSPITAL_COMMUNITY): Payer: Self-pay | Admitting: Emergency Medicine

## 2016-09-16 ENCOUNTER — Observation Stay (HOSPITAL_COMMUNITY)
Admission: EM | Admit: 2016-09-16 | Discharge: 2016-09-17 | Disposition: A | Discharge status: Home / Self Care | Payer: BLUE CROSS/BLUE SHIELD | Attending: Internal Medicine | Admitting: Internal Medicine

## 2016-09-16 DIAGNOSIS — K5641 Fecal impaction: Secondary | ICD-10-CM | POA: Diagnosis present

## 2016-09-16 DIAGNOSIS — E1101 Type 2 diabetes mellitus with hyperosmolarity with coma: Principal | ICD-10-CM | POA: Diagnosis present

## 2016-09-16 DIAGNOSIS — I10 Essential (primary) hypertension: Secondary | ICD-10-CM | POA: Diagnosis present

## 2016-09-16 DIAGNOSIS — C4359 Malignant melanoma of other part of trunk: Secondary | ICD-10-CM | POA: Diagnosis present

## 2016-09-16 DIAGNOSIS — F329 Major depressive disorder, single episode, unspecified: Secondary | ICD-10-CM | POA: Insufficient documentation

## 2016-09-16 DIAGNOSIS — E1140 Type 2 diabetes mellitus with diabetic neuropathy, unspecified: Secondary | ICD-10-CM | POA: Diagnosis present

## 2016-09-16 DIAGNOSIS — E1110 Type 2 diabetes mellitus with ketoacidosis without coma: Secondary | ICD-10-CM | POA: Diagnosis present

## 2016-09-16 DIAGNOSIS — D638 Anemia in other chronic diseases classified elsewhere: Secondary | ICD-10-CM | POA: Diagnosis not present

## 2016-09-16 DIAGNOSIS — E43 Unspecified severe protein-calorie malnutrition: Secondary | ICD-10-CM | POA: Diagnosis not present

## 2016-09-16 DIAGNOSIS — Z681 Body mass index (BMI) 19 or less, adult: Secondary | ICD-10-CM | POA: Insufficient documentation

## 2016-09-16 DIAGNOSIS — R739 Hyperglycemia, unspecified: Secondary | ICD-10-CM | POA: Diagnosis present

## 2016-09-16 DIAGNOSIS — E872 Acidosis, unspecified: Secondary | ICD-10-CM | POA: Diagnosis present

## 2016-09-16 DIAGNOSIS — E8729 Other acidosis: Secondary | ICD-10-CM

## 2016-09-16 LAB — URINALYSIS, ROUTINE W REFLEX MICROSCOPIC
BILIRUBIN URINE: NEGATIVE
Bacteria, UA: NONE SEEN
Glucose, UA: >=500 mg/dL — AB
HGB URINE DIPSTICK: NEGATIVE
KETONES UR: 20 mg/dL — AB
LEUKOCYTES UA: NEGATIVE
NITRITE: NEGATIVE
PROTEIN: NEGATIVE mg/dL
Specific Gravity, Urine: 1.01 (ref 1.005–1.030)
pH: 8 (ref 5.0–8.0)

## 2016-09-16 LAB — BASIC METABOLIC PANEL
ANION GAP: 10 (ref 5–15)
ANION GAP: 9 (ref 5–15)
ANION GAP: 7 (ref 5–15)
BUN: 17 mg/dL (ref 6–20)
BUN: 14 mg/dL (ref 6–20)
BUN: 12 mg/dL (ref 6–20)
CALCIUM: 8.5 mg/dL — AB (ref 8.9–10.3)
CALCIUM: 8.4 mg/dL — AB (ref 8.9–10.3)
CO2: 24 mmol/L (ref 22–32)
CO2: 22 mmol/L (ref 22–32)
CO2: 24 mmol/L (ref 22–32)
CREATININE: 0.59 mg/dL — AB (ref 0.61–1.24)
Calcium: 8.2 mg/dL — ABNORMAL LOW (ref 8.9–10.3)
Chloride: 102 mmol/L (ref 101–111)
Chloride: 104 mmol/L (ref 101–111)
Chloride: 106 mmol/L (ref 101–111)
Creatinine, Ser: 0.4 mg/dL — ABNORMAL LOW (ref 0.61–1.24)
Creatinine, Ser: 0.41 mg/dL — ABNORMAL LOW (ref 0.61–1.24)
GFR calc Af Amer: >60 mL/min (ref >60)
GFR calc Af Amer: >60 mL/min (ref >60)
GFR calc Af Amer: >60 mL/min (ref >60)
GFR calc non Af Amer: >60 mL/min (ref >60)
GFR calc non Af Amer: >60 mL/min (ref >60)
GFR calc non Af Amer: >60 mL/min (ref >60)
GLUCOSE: 184 mg/dL — AB (ref 65–99)
GLUCOSE: 89 mg/dL (ref 65–99)
GLUCOSE: 104 mg/dL — AB (ref 65–99)
POTASSIUM: 3.5 mmol/L (ref 3.5–5.1)
Potassium: 3.5 mmol/L (ref 3.5–5.1)
Potassium: 4.2 mmol/L (ref 3.5–5.1)
Sodium: 136 mmol/L (ref 135–145)
Sodium: 135 mmol/L (ref 135–145)
Sodium: 137 mmol/L (ref 135–145)

## 2016-09-16 LAB — COMPREHENSIVE METABOLIC PANEL
ALK PHOS: 64 U/L (ref 38–126)
ALT: 18 U/L (ref 17–63)
AST: 22 U/L (ref 15–41)
Albumin: 3.7 g/dL (ref 3.5–5.0)
Anion gap: 21 — ABNORMAL HIGH (ref 5–15)
BILIRUBIN TOTAL: 2.6 mg/dL — AB (ref 0.3–1.2)
BUN: 21 mg/dL — AB (ref 6–20)
CALCIUM: 8.9 mg/dL (ref 8.9–10.3)
CO2: 16 mmol/L — ABNORMAL LOW (ref 22–32)
CREATININE: 0.84 mg/dL (ref 0.61–1.24)
Chloride: 99 mmol/L — ABNORMAL LOW (ref 101–111)
Glucose, Bld: 513 mg/dL (ref 65–99)
Potassium: 4.3 mmol/L (ref 3.5–5.1)
Sodium: 136 mmol/L (ref 135–145)
TOTAL PROTEIN: 6.8 g/dL (ref 6.5–8.1)

## 2016-09-16 LAB — CBC
HCT: 30.6 % — ABNORMAL LOW (ref 39.0–52.0)
Hemoglobin: 10.4 g/dL — ABNORMAL LOW (ref 13.0–17.0)
MCH: 30 pg (ref 26.0–34.0)
MCHC: 34 g/dL (ref 30.0–36.0)
MCV: 88.2 fL (ref 78.0–100.0)
PLATELETS: 205 10*3/uL (ref 150–400)
RBC: 3.47 MIL/uL — AB (ref 4.22–5.81)
RDW: 14.3 % (ref 11.5–15.5)
WBC: 7.3 10*3/uL (ref 4.0–10.5)

## 2016-09-16 LAB — GLUCOSE, CAPILLARY
GLUCOSE-CAPILLARY: 282 mg/dL — AB (ref 65–99)
GLUCOSE-CAPILLARY: 123 mg/dL — AB (ref 65–99)
GLUCOSE-CAPILLARY: 108 mg/dL — AB (ref 65–99)
GLUCOSE-CAPILLARY: 96 mg/dL (ref 65–99)
GLUCOSE-CAPILLARY: 60 mg/dL — AB (ref 65–99)
Glucose-Capillary: 186 mg/dL — ABNORMAL HIGH (ref 65–99)
Glucose-Capillary: 152 mg/dL — ABNORMAL HIGH (ref 65–99)
Glucose-Capillary: 64 mg/dL — ABNORMAL LOW (ref 65–99)
Glucose-Capillary: 92 mg/dL (ref 65–99)
Glucose-Capillary: 121 mg/dL — ABNORMAL HIGH (ref 65–99)

## 2016-09-16 LAB — I-STAT CG4 LACTIC ACID, ED: LACTIC ACID, VENOUS: 2.18 mmol/L — AB (ref 0.5–1.9)

## 2016-09-16 LAB — LIPASE, BLOOD: LIPASE: 17 U/L (ref 11–51)

## 2016-09-16 MED ORDER — PREGABALIN 100 MG PO CAPS
200.0000 mg | ORAL_CAPSULE | Freq: Every day | ORAL | Status: DC
  Administered 2016-09-16: 200 mg via ORAL
  Filled 2016-09-16: qty 2

## 2016-09-16 MED ORDER — OXYCODONE-ACETAMINOPHEN 5-325 MG PO TABS
1.0000 | ORAL_TABLET | Freq: Every day | ORAL | Status: DC
  Administered 2016-09-16: 1 via ORAL
  Filled 2016-09-16: qty 1

## 2016-09-16 MED ORDER — PREGABALIN 100 MG PO CAPS
100.0000 mg | ORAL_CAPSULE | Freq: Every day | ORAL | Status: DC
  Filled 2016-09-16: qty 1

## 2016-09-16 MED ORDER — DOCUSATE SODIUM 100 MG PO CAPS
100.0000 mg | ORAL_CAPSULE | Freq: Every day | ORAL | Status: DC | PRN
Start: 2016-09-16 — End: 2016-09-17

## 2016-09-16 MED ORDER — INSULIN DETEMIR 100 UNIT/ML ~~LOC~~ SOLN
12.0000 [IU] | Freq: Two times a day (BID) | SUBCUTANEOUS | Status: DC
  Filled 2016-09-16: qty 0.12

## 2016-09-16 MED ORDER — TRAZODONE HCL 50 MG PO TABS
100.0000 mg | ORAL_TABLET | Freq: Every day | ORAL | Status: DC
  Administered 2016-09-16: 100 mg via ORAL
  Filled 2016-09-16: qty 2

## 2016-09-16 MED ORDER — INSULIN ASPART 100 UNIT/ML ~~LOC~~ SOLN: 6.0000 [IU] | Freq: Three times a day (TID) | SUBCUTANEOUS | Status: DC

## 2016-09-16 MED ORDER — HYDRALAZINE HCL 20 MG/ML IJ SOLN
10.0000 mg | INTRAMUSCULAR | Status: DC | PRN
Start: 2016-09-16 — End: 2016-09-17
  Filled 2016-09-16: qty 1

## 2016-09-16 MED ORDER — SODIUM CHLORIDE 0.9 % IV SOLN
INTRAVENOUS | Status: DC
  Administered 2016-09-16: 2.2 [IU]/h via INTRAVENOUS
  Filled 2016-09-16: qty 1

## 2016-09-16 MED ORDER — DEXTROSE-NACL 5-0.45 % IV SOLN: INTRAVENOUS | Status: DC

## 2016-09-16 MED ORDER — DEXTROSE-NACL 5-0.45 % IV SOLN
INTRAVENOUS | Status: DC
  Administered 2016-09-16: 14:00:00 via INTRAVENOUS

## 2016-09-16 MED ORDER — ENOXAPARIN SODIUM 40 MG/0.4ML ~~LOC~~ SOLN
40.0000 mg | SUBCUTANEOUS | Status: DC
  Administered 2016-09-16: 40 mg via SUBCUTANEOUS
  Filled 2016-09-16: qty 0.4

## 2016-09-16 MED ORDER — LISINOPRIL 10 MG PO TABS
20.0000 mg | ORAL_TABLET | Freq: Every day | ORAL | Status: DC
  Administered 2016-09-17: 20 mg via ORAL
  Filled 2016-09-16: qty 2

## 2016-09-16 MED ORDER — SODIUM CHLORIDE 0.9 % IV SOLN
INTRAVENOUS | Status: DC
  Administered 2016-09-16: 14:00:00 via INTRAVENOUS

## 2016-09-16 MED ORDER — INSULIN ASPART 100 UNIT/ML ~~LOC~~ SOLN
12.0000 [IU] | Freq: Once | SUBCUTANEOUS | Status: AC
  Administered 2016-09-16: 12 [IU] via INTRAVENOUS
  Filled 2016-09-16: qty 1

## 2016-09-16 MED ORDER — POLYETHYLENE GLYCOL 3350 17 G PO PACK
17.0000 g | PACK | Freq: Every day | ORAL | Status: DC
  Administered 2016-09-16: 17 g via ORAL
  Filled 2016-09-16: qty 1

## 2016-09-16 MED ORDER — POTASSIUM CHLORIDE 10 MEQ/100ML IV SOLN
10.0000 meq | INTRAVENOUS | Status: AC
  Administered 2016-09-16 (×2): 10 meq via INTRAVENOUS
  Filled 2016-09-16 (×2): qty 100

## 2016-09-16 MED ORDER — SODIUM CHLORIDE 0.9 % IV BOLUS (SEPSIS)
1000.0000 mL | Freq: Once | INTRAVENOUS | Status: AC
  Administered 2016-09-16: 1000 mL via INTRAVENOUS

## 2016-09-16 MED ORDER — ADULT MULTIVITAMIN W/MINERALS CH
1.0000 | ORAL_TABLET | Freq: Every day | ORAL | Status: DC
  Administered 2016-09-17: 1 via ORAL
  Filled 2016-09-16: qty 1

## 2016-09-16 MED ORDER — AMLODIPINE BESYLATE 5 MG PO TABS
5.0000 mg | ORAL_TABLET | Freq: Every day | ORAL | Status: DC
  Administered 2016-09-16 – 2016-09-17 (×2): 5 mg via ORAL
  Filled 2016-09-16 (×2): qty 1

## 2016-09-16 MED ORDER — ACETAMINOPHEN 500 MG PO TABS: 500.0000 mg | ORAL_TABLET | Freq: Four times a day (QID) | ORAL | Status: DC | PRN

## 2016-09-16 MED ORDER — PANTOPRAZOLE SODIUM 40 MG PO TBEC
40.0000 mg | DELAYED_RELEASE_TABLET | Freq: Every day | ORAL | Status: DC
  Administered 2016-09-16 – 2016-09-17 (×2): 40 mg via ORAL
  Filled 2016-09-16 (×2): qty 1

## 2016-09-16 MED ORDER — ONDANSETRON HCL 4 MG/2ML IJ SOLN: 4.0000 mg | Freq: Four times a day (QID) | INTRAMUSCULAR | Status: DC | PRN

## 2016-09-16 MED ORDER — INSULIN DETEMIR 100 UNIT/ML ~~LOC~~ SOLN
12.0000 [IU] | Freq: Two times a day (BID) | SUBCUTANEOUS | Status: DC
  Administered 2016-09-16: 12 [IU] via SUBCUTANEOUS
  Filled 2016-09-16 (×3): qty 0.12

## 2016-09-16 MED ORDER — SODIUM CHLORIDE 0.9 % IV SOLN
INTRAVENOUS | Status: DC
  Administered 2016-09-16: 1.3 [IU]/h via INTRAVENOUS
  Filled 2016-09-16: qty 1

## 2016-09-16 NOTE — Progress Notes (Signed)
Hypoglycemic Event  CBG: 60 @ 2013  Treatment: 15 GM carbohydrate snack  Symptoms: Sweaty  Follow-up CBG: Time: 2032 CBG Result: 64  Possible Reasons for Event: Inadequate meal intake  Comments/MD notified: gave another 15g carb snack, CBG recheck 92, Dr. Myna Hidalgo notified    Salvador Bigbee, Daralene Milch

## 2016-09-16 NOTE — ED Notes (Signed)
Bed: CB76 Expected date:  Expected time:  Means of arrival:  Comments: 61 yo constipation, hyperglycemia

## 2016-09-16 NOTE — ED Notes (Signed)
Gave Lactic to MD.

## 2016-09-16 NOTE — H&P (Signed)
History and Physical    Jesse Lane DGL:[id number] DOB: 1955/08/31 DOA: 09/16/2016  Referring MD/NP/PA: Dr. Venora Maples  PCP: [name], [place]   Outpatient Specialists: Oncology, Dr. Charlyne Mom   Patient coming from: home   Chief Complaint: high sugars  HPI: Jesse Lane is a 61 y.o. male with medical history significant for diabetes and related neuropathy, hypertension, melanoma undergoing chemotherapy who presented to ED with worsening CBGs as well as pain in the rectal area. Pt has had constipation for some time and has tried different meds with no significant symptomatic relief. Rectal pain is intermittent and no associated diarrhea. No nausea or vomiting. No fevers or chills. No chest pain, no palpitations or shortness of breath. No urinary complaints.    ED Course: In ED, pt was hemodynamically stable. BP was as high as 179/96. Blood work showed hgb 10.4, CO2 16, glucose on BMP 513, lactic acid 2.18.  Anion gap was 21. UA is pending so not sure about presence of ketones. Pt started on insulin drip and admitted to SDU.  Review of Systems:  Constitutional: Negative for fever, chills, diaphoresis, activity change, appetite change and fatigue.  HENT: Negative for ear pain, nosebleeds, congestion, facial swelling, rhinorrhea, neck pain, neck stiffness and ear discharge.   Eyes: Negative for pain, discharge, redness, itching and visual disturbance.  Respiratory: Negative for cough, choking, chest tightness, shortness of breath, wheezing and stridor.   Cardiovascular: Negative for chest pain, palpitations and leg swelling.  Gastrointestinal: Negative for abdominal distention.  Genitourinary: Negative for dysuria, urgency, frequency, hematuria, flank pain, decreased urine volume, difficulty urinating and dyspareunia.  Musculoskeletal: Negative for back pain, joint swelling, arthralgias and gait problem.  Neurological: Negative for dizziness, tremors, seizures, syncope, facial  asymmetry, speech difficulty, weakness, light-headedness, numbness and headaches.  Hematological: Negative for adenopathy. Does not bruise/bleed easily.  Psychiatric/Behavioral: Negative for hallucinations, behavioral problems, confusion, dysphoric mood, decreased concentration and agitation.   Past Medical History:  Diagnosis Date  . Cancer ([place])   . Diabetes mellitus type 2, uncontrolled ([place])   . Diabetic neuropathy ([place])   . History of stomach ulcers   . Hypertension     Past Surgical History:  Procedure Laterality Date  . COLONOSCOPY    . INGUINAL HERNIA REPAIR N/A 08/15/2015   Procedure: HERNIA REPAIR INGUINAL INCARCERATED;  Surgeon: Alphonsa Overall, MD;  Location: [facility] ORS;  Service: General;  Laterality: N/A;  with MESH  . pyloric stenosis    . TONSILLECTOMY      Social history:  reports that he has quit smoking. His smoking use included Cigarettes. He has a 5.00 pack-year smoking history. He has never used smokeless tobacco. He reports that he uses drugs, including Marijuana. He reports that he does not drink alcohol.  Ambulation: ambulates without assistance at baseline   No Known Allergies  Family History  Problem Relation Age of Onset  . Breast cancer Mother   . Bone cancer Mother   . Melanoma Mother   . Prostate cancer Father   . Diabetes Neg Hx     Prior to Admission medications   Medication Sig Start Date End Date Taking? Authorizing Provider  acetaminophen (TYLENOL) 500 MG tablet Take 500 mg by mouth every 6 (six) hours as needed for mild pain or headache.   Yes [provider]  Cholecalciferol (VITAMIN D PO) Take 1 tablet by mouth daily with breakfast.   Yes [provider]  Cyanocobalamin (VITAMIN B-12 PO) Take 2 tablets by mouth daily  with breakfast.   Yes [provider]  docusate sodium (COLACE) 100 MG capsule Take 100 mg by mouth daily as needed for mild constipation.   Yes [provider]  insulin aspart (NOVOLOG FLEXPEN)  100 UNIT/ML FlexPen Inject 6 Units into the skin 3 (three) times daily with meals. 09/07/16  Yes Barton Dubois, MD  Insulin Detemir (LEVEMIR) 100 UNIT/ML Pen Inject 12 Units into the skin 2 (two) times daily. 09/07/16  Yes Barton Dubois, MD  lisinopril (PRINIVIL,ZESTRIL) 20 MG tablet Take 20 mg by mouth daily with breakfast.  07/24/16  Yes [provider]  LYRICA 100 MG capsule Take 100-200 mg by mouth 2 (two) times daily. Take 100 mg in the morning and 200 mg in the evening 07/27/16  Yes [provider]  Multiple Vitamin (MULTIVITAMIN WITH MINERALS) TABS tablet Take 1 tablet by mouth daily with breakfast.   Yes [provider]  omeprazole (PRILOSEC) 40 MG capsule Take 40 mg by mouth daily with breakfast.    Yes [provider]  ondansetron (ZOFRAN) 4 MG tablet Take 1 tablet (4 mg total) by mouth daily as needed for nausea or vomiting. Patient taking differently: Take 4 mg by mouth daily with breakfast.  08/13/16 08/13/17 Yes Patrecia Pour, Christean Grief, MD  oxyCODONE-acetaminophen (PERCOCET/ROXICET) 5-325 MG tablet Take 1 tablet by mouth at bedtime.   Yes [provider]  traZODone (DESYREL) 100 MG tablet Take 100 mg by mouth at bedtime. 07/24/16  Yes [provider]    Physical Exam: Vitals:   09/16/16 1012 09/16/16 1024 09/16/16 1030 09/16/16 1148  BP:  (!) 185/101 (!) 175/104 (!) 158/86  Pulse:  92 93 82  Resp:  (!) 30 (!) 25 (!) 21  Temp:  97.8 F (36.6 C)  97.8 F (36.6 C)  SpO2: 96% 100% 100% 100%    Constitutional: NAD, calm, comfortable Vitals:   09/16/16 1012 09/16/16 1024 09/16/16 1030 09/16/16 1148  BP:  (!) 185/101 (!) 175/104 (!) 158/86  Pulse:  92 93 82  Resp:  (!) 30 (!) 25 (!) 21  Temp:  97.8 F (36.6 C)  97.8 F (36.6 C)  SpO2: 96% 100% 100% 100%   Eyes: PERRL, lids and conjunctivae normal ENMT: Mucous membranes are moist. Posterior pharynx clear of any exudate or lesions.Normal dentition.  Neck: normal, supple, no masses, no  thyromegaly Respiratory: clear to auscultation bilaterally, no wheezing, no crackles. Normal respiratory effort. No accessory muscle use.  Cardiovascular: Regular rate and rhythm, no murmurs / rubs / gallops. No extremity edema. 2+ pedal pulses. No carotid bruits.  Abdomen: no tenderness, no masses palpated. No hepatosplenomegaly. Bowel sounds positive.  Musculoskeletal: no clubbing / cyanosis. No joint deformity upper and lower extremities. Good ROM, no contractures. Normal muscle tone.  Skin: no rashes, lesions, ulcers. No induration Neurologic: CN 2-12 grossly intact. Sensation intact, DTR normal. Strength 5/5 in all 4.  Psychiatric: Normal judgment and insight. Alert and oriented x 3. Normal mood.    Labs on Admission: I have personally reviewed following labs and imaging studies  CBC:  Recent Labs Lab 09/16/16 1030  WBC 7.3  HGB 10.4*  HCT 30.6*  MCV 88.2  PLT 174   Basic Metabolic Panel:  Recent Labs Lab 09/16/16 1030  NA 136  K 4.3  CL 99*  CO2 16*  GLUCOSE 513*  BUN 21*  CREATININE 0.84  CALCIUM 8.9   GFR: Estimated Creatinine Clearance: 75.7 mL/min (by C-G formula based on SCr of 0.84 mg/dL). Liver  Function Tests:  Recent Labs Lab 09/16/16 1030  AST 22  ALT 18  ALKPHOS 64  BILITOT 2.6*  PROT 6.8  ALBUMIN 3.7    Recent Labs Lab 09/16/16 1030  LIPASE 17   No results for input(s): AMMONIA in the last 168 hours. Coagulation Profile: No results for input(s): INR, PROTIME in the last 168 hours. Cardiac Enzymes: No results for input(s): CKTOTAL, CKMB, CKMBINDEX, TROPONINI in the last 168 hours. BNP (last 3 results) No results for input(s): PROBNP in the last 8760 hours. HbA1C: No results for input(s): HGBA1C in the last 72 hours. CBG: No results for input(s): GLUCAP in the last 168 hours. Lipid Profile: No results for input(s): CHOL, HDL, LDLCALC, TRIG, CHOLHDL, LDLDIRECT in the last 72 hours. Thyroid Function Tests: No results for input(s):  TSH, T4TOTAL, FREET4, T3FREE, THYROIDAB in the last 72 hours. Anemia Panel: No results for input(s): VITAMINB12, FOLATE, FERRITIN, TIBC, IRON, RETICCTPCT in the last 72 hours. Urine analysis:  Sepsis Labs: @LABRCNTIP (procalcitonin:4,lacticidven:4)  Recent Results (from the past 240 hour(s))  MRSA PCR Screening     Status: None   Collection Time: 09/06/16  4:00 PM  Result Value Ref Range Status   MRSA by PCR NEGATIVE NEGATIVE Final     Radiological Exams on Admission: No results found.  EKG: pending  Assessment/Plan  Principal Problem:   Hyperglycemic hyperosmolar nonketotic coma (HCC) / High anion gap metabolic acidosis / Lactic acidosis - Started insulin drip - Monitor CBG per protocol - Continue IV fluids  - Admission to SDU - Obtain urinalysis  Active Problems:   Uncontrolled diabetes mellitus with diabetic neuropathy  - A1c 9.6 indicating less than goal glycemic control - Currently on insulin drip    Malignant melanoma of axilla ([place]) - Management per oncology    Diabetic neuropathy (HCC) - Continue Lyrica    Essential hypertension - Continue lisinopril - Added Norvasc for better BP control     Anemia of chronic disease - Due to history of melanoma - Hgb stable     Rectal pain - Likely due to constipation - Fecal impaction done in ED     DVT prophylaxis: Lovenox subQ Code Status: full code Family Communication: no family at the bedside Disposition Plan: admission to SDU as pt requires insulin drip Consults called: none   Disposition plan: Further plan will depend as patient's clinical course evolves and further radiologic and laboratory data become available.    At the time of admission, it appears that the appropriate admission status for this patient is INPATIENT .Thisis judged to be reasonable and necessary in order to provide the required intensity of service to ensure the patient's safetygiven the patient presentation of hyperglycemia and  requirement for insulin drip in addition to physical exam findings, radiographic and laboratory data in the context of chronic comorbidities.   Leisa Lenz MD [facility] Pager [phone]  If 7PM-7AM, please contact night-coverage [email or web address] Password [email or web address]  09/16/2016, 12:11 PM

## 2016-09-16 NOTE — ED Triage Notes (Signed)
Per [facility] pt from home , reports abd pain and constipation x 2 days. chemo pt for melanoma. Last chemo 4 days ago. Hyperglycemia , reading high on meter per EMS. Alert  and oriented x 4. Received 4 mg Zofran IV by EMS.

## 2016-09-16 NOTE — ED Provider Notes (Signed)
[facility] DEPT Provider Note   CSN: [id number] Arrival date & time: 09/16/16  1001     History   Chief Complaint Chief Complaint  Patient presents with  . Abdominal Pain    chemo pt    HPI Jesse Lane is a 61 y.o. male.  HPI Patient presents the emergency department with complaints of hyperglycemia this morning as well as severe rectal pain.  He states been constipated for several days.  Is intermittently had constipation.  He tried medications and digital disimpaction without improvement in his symptoms.  He reports compliance with his medications including his diabetes medications.  Blood sugar found to be greater than 500 on arrival to the ER.  He currently is receiving chemotherapy for melanoma with positive nodes.  Pain in his rectum is severe in severity.  He is visibly uncomfortable at this time   Past Medical History:  Diagnosis Date  . Cancer ([place])   . Diabetes mellitus type 2, uncontrolled ([place])   . Diabetic neuropathy ([place])   . History of stomach ulcers   . Hypertension     Patient Active Problem List   Diagnosis Date Noted  . Hypokalemia   . Diabetic neuropathy ([place]) 09/06/2016  . Essential hypertension 09/06/2016  . Normocytic anemia 09/06/2016  . DKA (diabetic ketoacidoses) ([place]) 08/12/2016  . AKI (acute kidney injury) ([place]) 08/12/2016  . Malignant melanoma of axilla ([place]) 08/12/2016  . Incarcerated inguinal hernia 08/16/2015    Past Surgical History:  Procedure Laterality Date  . COLONOSCOPY    . INGUINAL HERNIA REPAIR N/A 08/15/2015   Procedure: HERNIA REPAIR INGUINAL INCARCERATED;  Surgeon: Alphonsa Overall, MD;  Location: [facility] ORS;  Service: General;  Laterality: N/A;  with MESH  . pyloric stenosis    . TONSILLECTOMY         Home Medications    Prior to Admission medications   Medication Sig Start Date End Date Taking? Authorizing Provider  acetaminophen (TYLENOL) 500 MG tablet Take 500 mg by mouth every 6 (six) hours as needed for mild  pain or headache.   Yes [provider]  Cholecalciferol (VITAMIN D PO) Take 1 tablet by mouth daily with breakfast.   Yes [provider]  Cyanocobalamin (VITAMIN B-12 PO) Take 2 tablets by mouth daily with breakfast.   Yes [provider]  docusate sodium (COLACE) 100 MG capsule Take 100 mg by mouth daily as needed for mild constipation.   Yes [provider]  insulin aspart (NOVOLOG FLEXPEN) 100 UNIT/ML FlexPen Inject 6 Units into the skin 3 (three) times daily with meals. 09/07/16  Yes Barton Dubois, MD  Insulin Detemir (LEVEMIR) 100 UNIT/ML Pen Inject 12 Units into the skin 2 (two) times daily. 09/07/16  Yes Barton Dubois, MD  lisinopril (PRINIVIL,ZESTRIL) 20 MG tablet Take 20 mg by mouth daily with breakfast.  07/24/16  Yes [provider]  LYRICA 100 MG capsule Take 100-200 mg by mouth 2 (two) times daily. Take 100 mg in the morning and 200 mg in the evening 07/27/16  Yes [provider]  Multiple Vitamin (MULTIVITAMIN WITH MINERALS) TABS tablet Take 1 tablet by mouth daily with breakfast.   Yes [provider]  omeprazole (PRILOSEC) 40 MG capsule Take 40 mg by mouth daily with breakfast.    Yes [provider]  ondansetron (ZOFRAN) 4 MG tablet Take 1 tablet (4 mg total) by mouth daily as needed for nausea or vomiting. Patient taking differently: Take 4 mg by mouth daily with breakfast.  08/13/16 08/13/17 Yes Doreatha Lew, MD  oxyCODONE-acetaminophen (PERCOCET/ROXICET) 5-325 MG tablet Take 1 tablet by mouth at bedtime.   Yes [provider]  traZODone (DESYREL) 100 MG tablet Take 100 mg by mouth at bedtime. 07/24/16  Yes [provider]    Family History Family History  Problem Relation Age of Onset  . Breast cancer Mother   . Bone cancer Mother   . Melanoma Mother   . Prostate cancer Father   . Diabetes Neg Hx     Social History Social History  Substance Use Topics  . Smoking status: Former  Smoker    Packs/day: 1.00    Years: 5.00    Types: Cigarettes  . Smokeless tobacco: Never Used  . Alcohol use No     Comment: daily     Allergies   Patient has no known allergies.   Review of Systems Review of Systems  All other systems reviewed and are negative.    Physical Exam Updated Vital Signs BP (!) 158/86   Pulse 82   Temp 97.8 F (36.6 C)   Resp (!) 21   SpO2 100%   Physical Exam  Constitutional: He is oriented to person, place, and time. He appears well-developed and well-nourished.  HENT:  Head: Normocephalic and atraumatic.  Eyes: EOM are normal.  Neck: Normal range of motion.  Cardiovascular: Normal rate, regular rhythm and normal heart sounds.   Pulmonary/Chest: Effort normal and breath sounds normal. No respiratory distress.  Abdominal: Soft. He exhibits no distension. There is no tenderness.  Genitourinary:  Genitourinary Comments: Large fecal impaction found in the rectum.  Brown stool.  Musculoskeletal: Normal range of motion.  Neurological: He is alert and oriented to person, place, and time.  Skin: Skin is warm and dry.  Psychiatric: He has a normal mood and affect. Judgment normal.  Nursing note and vitals reviewed.    ED Treatments / Results  Labs (all labs ordered are listed, but only abnormal results are displayed) Labs Reviewed  COMPREHENSIVE METABOLIC PANEL - Abnormal; Notable for the following:       Result Value   Chloride 99 (*)    CO2 16 (*)    Glucose, Bld 513 (*)    BUN 21 (*)    Total Bilirubin 2.6 (*)    Anion gap 21 (*)    All other components within normal limits  CBC - Abnormal; Notable for the following:    RBC 3.47 (*)    Hemoglobin 10.4 (*)    HCT 30.6 (*)    All other components within normal limits  I-STAT CG4 LACTIC ACID, ED - Abnormal; Notable for the following:    Lactic Acid, Venous 2.18 (*)    All other components within normal limits  LIPASE, BLOOD    EKG  EKG Interpretation None        Radiology No results found.  Procedures Fecal disimpaction Performed by: Jola Schmidt Authorized by: Jola Schmidt  Consent: Verbal consent obtained. Patient understanding: patient states understanding of the procedure being performed Required items: required blood products, implants, devices, and special equipment available Time out: Immediately prior to procedure a "time out" was called to verify the correct patient, procedure, equipment, support staff and site/side marked as required. Patient tolerance: Patient tolerated the procedure well with no immediate complications    (including critical care time)  Medications Ordered in ED Medications  polyethylene glycol (MIRALAX / GLYCOLAX) packet 17 g (not administered)  sodium chloride 0.9 % bolus  1,000 mL (not administered)  insulin aspart (novoLOG) injection 12 Units (not administered)  dextrose 5 %-0.45 % sodium chloride infusion (not administered)  insulin regular (NOVOLIN R,HUMULIN R) 100 Units in sodium chloride 0.9 % 100 mL (1 Units/mL) infusion (not administered)     Initial Impression / Assessment and Plan / ED Course  I have reviewed the triage vital signs and the nursing notes.  Pertinent labs & imaging results that were available during my care of the patient were reviewed by me and considered in my medical decision making (see chart for details).     Significant improvement after digital disimpaction.  MiraLAX now.  Found to be hyperglycemic with a anion gap at 21.  CO2 16.  Patient be started on insulin drip.  Admit to the hospital for hyperglycemia control  Final Clinical Impressions(s) / ED Diagnoses   Final diagnoses:  Hyperglycemia  Increased anion gap metabolic acidosis  Fecal impaction in rectum [facility])    New Prescriptions New Prescriptions   No medications on file     Jola Schmidt, MD 09/16/16 1155

## 2016-09-16 NOTE — ED Notes (Signed)
Notified Dr Venora Maples and Radene Ou, RN of pt critical glucose value

## 2016-09-17 DIAGNOSIS — E872 Acidosis: Secondary | ICD-10-CM

## 2016-09-17 DIAGNOSIS — E43 Unspecified severe protein-calorie malnutrition: Secondary | ICD-10-CM

## 2016-09-17 DIAGNOSIS — E1110 Type 2 diabetes mellitus with ketoacidosis without coma: Secondary | ICD-10-CM | POA: Diagnosis present

## 2016-09-17 DIAGNOSIS — K5641 Fecal impaction: Secondary | ICD-10-CM | POA: Diagnosis not present

## 2016-09-17 DIAGNOSIS — E1101 Type 2 diabetes mellitus with hyperosmolarity with coma: Secondary | ICD-10-CM | POA: Diagnosis not present

## 2016-09-17 DIAGNOSIS — C4359 Malignant melanoma of other part of trunk: Secondary | ICD-10-CM

## 2016-09-17 DIAGNOSIS — I10 Essential (primary) hypertension: Secondary | ICD-10-CM | POA: Diagnosis not present

## 2016-09-17 DIAGNOSIS — R739 Hyperglycemia, unspecified: Secondary | ICD-10-CM | POA: Diagnosis not present

## 2016-09-17 LAB — CBC
HEMATOCRIT: 28.2 % — AB (ref 39.0–52.0)
Hemoglobin: 9.6 g/dL — ABNORMAL LOW (ref 13.0–17.0)
MCH: 29.8 pg (ref 26.0–34.0)
MCHC: 34 g/dL (ref 30.0–36.0)
MCV: 87.6 fL (ref 78.0–100.0)
Platelets: 199 10*3/uL (ref 150–400)
RBC: 3.22 MIL/uL — AB (ref 4.22–5.81)
RDW: 14.8 % (ref 11.5–15.5)
WBC: 4.8 10*3/uL (ref 4.0–10.5)

## 2016-09-17 LAB — BASIC METABOLIC PANEL
ANION GAP: 7 (ref 5–15)
BUN: 12 mg/dL (ref 6–20)
CO2: 26 mmol/L (ref 22–32)
Calcium: 8.5 mg/dL — ABNORMAL LOW (ref 8.9–10.3)
Chloride: 107 mmol/L (ref 101–111)
Creatinine, Ser: 0.4 mg/dL — ABNORMAL LOW (ref 0.61–1.24)
GFR calc Af Amer: >60 mL/min (ref >60)
GFR calc non Af Amer: >60 mL/min (ref >60)
GLUCOSE: 94 mg/dL (ref 65–99)
POTASSIUM: 3.2 mmol/L — AB (ref 3.5–5.1)
Sodium: 140 mmol/L (ref 135–145)

## 2016-09-17 LAB — GLUCOSE, CAPILLARY
GLUCOSE-CAPILLARY: 114 mg/dL — AB (ref 65–99)
Glucose-Capillary: 36 mg/dL — CL (ref 65–99)
Glucose-Capillary: 125 mg/dL — ABNORMAL HIGH (ref 65–99)
Glucose-Capillary: 84 mg/dL (ref 65–99)
Glucose-Capillary: 307 mg/dL — ABNORMAL HIGH (ref 65–99)

## 2016-09-17 MED ORDER — DEXTROSE 50 % IV SOLN
INTRAVENOUS | Status: AC
  Administered 2016-09-17: 50 mL
  Filled 2016-09-17: qty 50

## 2016-09-17 MED ORDER — ENSURE ENLIVE PO LIQD: 237.0000 mL | Freq: Three times a day (TID) | ORAL | Status: DC

## 2016-09-17 MED ORDER — OMEPRAZOLE 40 MG PO CPDR: 40.0000 mg | DELAYED_RELEASE_CAPSULE | Freq: Every day | ORAL | 0 refills | Status: DC

## 2016-09-17 MED ORDER — ONDANSETRON HCL 4 MG PO TABS: 4.0000 mg | ORAL_TABLET | Freq: Four times a day (QID) | ORAL | 0 refills | Status: AC | PRN

## 2016-09-17 MED ORDER — POLYETHYLENE GLYCOL 3350 17 G PO PACK
17.0000 g | PACK | Freq: Two times a day (BID) | ORAL | Status: DC
  Administered 2016-09-17: 17 g via ORAL
  Filled 2016-09-17: qty 1

## 2016-09-17 MED ORDER — ENSURE ENLIVE PO LIQD: 237.0000 mL | Freq: Three times a day (TID) | ORAL | 12 refills | Status: DC

## 2016-09-17 MED ORDER — INSULIN ASPART 100 UNIT/ML FLEXPEN: PEN_INJECTOR | SUBCUTANEOUS | 2 refills | Status: DC

## 2016-09-17 MED ORDER — POLYETHYLENE GLYCOL 3350 17 G PO PACK: 17.0000 g | PACK | Freq: Two times a day (BID) | ORAL | 0 refills | Status: DC

## 2016-09-17 MED ORDER — INSULIN ASPART 100 UNIT/ML ~~LOC~~ SOLN
0.0000 [IU] | Freq: Three times a day (TID) | SUBCUTANEOUS | Status: DC
  Administered 2016-09-17: 7 [IU] via SUBCUTANEOUS

## 2016-09-17 NOTE — Progress Notes (Signed)
Inpatient Diabetes Program Recommendations  AACE/ADA: New Consensus Statement on Inpatient Glycemic Control (2015)  Target Ranges:  Prepandial:   less than 140 mg/dL      Peak postprandial:   less than 180 mg/dL (1-2 hours)      Critically ill patients:  140 - 180 mg/dL   Lab Results  Component Value Date   GLUCAP 114 (H) 09/17/2016   HGBA1C 9.6 (H) 08/13/2016   Review of Glycemic Control  Diabetes history: DM 2 Outpatient Diabetes medications: Levemir 12 units BID, Novolog 6 units tid Current orders for Inpatient glycemic control: Levemir 12 units BID, Novolog Sensitive Correction 0-9 units tid  Consult for DM education DM Coordinator spoke with patient on 6/1 about DM control. Patients home Levemir was increased from 7 to 12 units BID coming in this admission. Patient reported elevated glucose with Chemo treatments. Patient also reported going to see a new Endocrinologist. Will watch trends while here.  Thanks,  Tama Headings RN, MSN, [facility] Inpatient Diabetes Coordinator Team Pager [phone] (8a-5p)

## 2016-09-17 NOTE — Discharge Summary (Addendum)
Physician Discharge Summary  Jesse Lane [id number] DOB: 08-Aug-1955 DOA: 09/16/2016  PCP: [name], PA  Admit date: 09/16/2016 Discharge date: 09/17/2016  Admitted From: Home  Disposition:  Home   Recommendations for Outpatient Follow-up:  1. Follow up with PCP in 1- week 2. Patient has been placed on insulin aspart sliding scale 3. Continue home dose of insulin levimir 4. Placed on Miralax 5. Refill for prilosec and zofran  Home Health: No  Equipment/Devices: No   Discharge Condition: stable  CODE STATUS: Full  Diet recommendation: Heart Healthy / Carb Modified   Brief/Interim Summary: 61 year old male presented with elevated glucose. Patient known to have diabetes mellitus type 2, hypertension and melanoma under chemotherapy. Patient developed rectal pain, intermittent not associated with diarrhea, but with elevated glucose. On initial physical examination blood pressure 185/101, heart rate 92, respiratory rate 30, temperature 97.8, oxygen saturation 100%. Oral mucosa was moist, his lungs were clear to auscultation, heart S1-S2 present rhythmic, his abdomen was soft, lower extremity is no edema. Sodium 136 potassium 4.3, chloride 99, bicarbonate 16, glucose 513, BUN 21, creatinine 0.84, anion gap 21, white count 7.3, human 10.4, hematocrit 30.6, platelets 205. Urine negative for infection, creatinine 500 glucose. Patient was admitted to the stepdown unit with a working diagnosis of diabetes ketoacidosis.  1. Diabetes ketoacidosis. Patient was admitted to the stepdown unit, he was placed on insulin infusion, frequent glucose monitoring, with significant improvement in his anion gap. He was transitioned to subcutaneous insulin with no major complications, his diet was advanced with good toleration. Patient was found to be stable to be discharged on June 11, he had a remarkable improvement before the 2 midnight stay. His short acting insulin was changed to a sliding  scale.  2. Fecal impaction. Patient was found to have a fecal impaction, it was addressed in the emergency department with improvement of his symptoms, he will be discharged on MiraLAX. Further bowel movements with no major complications.  3. Type 2 diabetes mellitus. Patient was continued on subcutaneous insulin long-acting and short-acting. He was instructed to use his aspirate insulin as a sliding scale with meals. \  4. Hypertension. Patient was continued on Ace inhibitors.  5. Depression. Continue trazodone.  6. Severe calorie protein malnutrition. Will continue with dietary supplements, follow as an outpatient.    Discharge Diagnoses:  Principal Problem:   Hyperglycemic hyperosmolar nonketotic coma ([place]) Active Problems:   Malignant melanoma of axilla ([facility])   Diabetic neuropathy ([facility])   Essential hypertension   Hyperglycemia   High anion gap metabolic acidosis   Lactic acidosis   Anemia of chronic disease   Protein-calorie malnutrition, severe    Discharge Instructions   Allergies as of 09/17/2016   No Known Allergies     Medication List    STOP taking these medications   docusate sodium 100 MG capsule Commonly known as:  COLACE     TAKE these medications   acetaminophen 500 MG tablet Commonly known as:  TYLENOL Take 500 mg by mouth every 6 (six) hours as needed for mild pain or headache.   feeding supplement (ENSURE ENLIVE) Liqd Take 237 mLs by mouth 3 (three) times daily between meals.   insulin aspart 100 UNIT/ML FlexPen Commonly known as:  NOVOLOG FLEXPEN For glucose 150-200 give 2 units, 201-250 give 4 units, 251-300 give 6 units, 301-350 give 8 units, 351 or greater give 10 units, What changed:  how much to take  how to take this  when to take  this  additional instructions   Insulin Detemir 100 UNIT/ML Pen Commonly known as:  LEVEMIR Inject 12 Units into the skin 2 (two) times daily.   lisinopril 20 MG tablet Commonly known as:   PRINIVIL,ZESTRIL Take 20 mg by mouth daily with breakfast.   LYRICA 100 MG capsule Generic drug:  pregabalin Take 100-200 mg by mouth 2 (two) times daily. Take 100 mg in the morning and 200 mg in the evening   multivitamin with minerals Tabs tablet Take 1 tablet by mouth daily with breakfast.   omeprazole 40 MG capsule Commonly known as:  PRILOSEC Take 1 capsule (40 mg total) by mouth daily with breakfast.   ondansetron 4 MG tablet Commonly known as:  ZOFRAN Take 1 tablet (4 mg total) by mouth every 6 (six) hours as needed for nausea or vomiting. What changed:  when to take this   oxyCODONE-acetaminophen 5-325 MG tablet Commonly known as:  PERCOCET/ROXICET Take 1 tablet by mouth at bedtime.   polyethylene glycol packet Commonly known as:  MIRALAX / GLYCOLAX Take 17 g by mouth 2 (two) times daily.   traZODone 100 MG tablet Commonly known as:  DESYREL Take 100 mg by mouth at bedtime.   VITAMIN B-12 PO Take 2 tablets by mouth daily with breakfast.   VITAMIN D PO Take 1 tablet by mouth daily with breakfast.      Follow-up Information    [name], PA.   Specialty:  Physician Assistant Contact information: [place] [address] [phone]          No Known Allergies  Consultations:     Procedures/Studies:  No results found.   Subjective: Patient feeling well, no nausea or vomiting, tolerating po well. No chest pain or dyspnea, positive bowel movements.   Discharge Exam: Vitals:   09/17/16 1100 09/17/16 1200  BP:  (!) 186/126  Pulse: 75 79  Resp: 16 17  Temp:     Vitals:   09/17/16 0900 09/17/16 1000 09/17/16 1100 09/17/16 1200  BP:  (!) 153/87  (!) 186/126  Pulse: 70 74 75 79  Resp: 14 12 16 17   Temp:      TempSrc:      SpO2: 96% 98% 99% 99%  Weight:      Height:        General: Pt is alert, awake, not in acute distress E ENT: no pallor or icterus, oral mucosa moist.  Cardiovascular: RRR, S1/S2 +, no rubs, no  gallops Respiratory: CTA bilaterally, no wheezing, no rhonchi Abdominal: Soft, NT, ND, bowel sounds + Extremities: no edema, no cyanosis    The results of significant diagnostics from this hospitalization (including imaging, microbiology, ancillary and laboratory) are listed below for reference.     Microbiology: No results found for this or any previous visit (from the past 240 hour(s)).   Labs: BNP (last 3 results) No results for input(s): BNP in the last 8760 hours. Basic Metabolic Panel:  Recent Labs Lab 09/16/16 1030 09/16/16 1447 09/16/16 1843 09/16/16 2236 09/17/16 0324  NA 136 136 135 137 140  K 4.3 3.5 4.2 3.5 3.2*  CL 99* 102 104 106 107  CO2 16* 24 22 24 26   GLUCOSE 513* 184* 89 104* 94  BUN 21* 17 14 12 12   CREATININE 0.84 0.59* 0.40* 0.41* 0.40*  CALCIUM 8.9 8.5* 8.2* 8.4* 8.5*   Liver Function Tests:  Recent Labs Lab 09/16/16 1030  AST 22  ALT 18  ALKPHOS 64  BILITOT 2.6*  PROT 6.8  ALBUMIN 3.7    Recent Labs Lab 09/16/16 1030  LIPASE 17   No results for input(s): AMMONIA in the last 168 hours. CBC:  Recent Labs Lab 09/16/16 1030 09/17/16 0324  WBC 7.3 4.8  HGB 10.4* 9.6*  HCT 30.6* 28.2*  MCV 88.2 87.6  PLT 205 199   Cardiac Enzymes: No results for input(s): CKTOTAL, CKMB, CKMBINDEX, TROPONINI in the last 168 hours. BNP: Invalid input(s): POCBNP CBG:  Recent Labs Lab 09/17/16 0323 09/17/16 0346 09/17/16 0623 09/17/16 0746 09/17/16 1141  GLUCAP 36* 125* 84 114* 307*   D-Dimer No results for input(s): DDIMER in the last 72 hours. Hgb A1c No results for input(s): HGBA1C in the last 72 hours. Lipid Profile No results for input(s): CHOL, HDL, LDLCALC, TRIG, CHOLHDL, LDLDIRECT in the last 72 hours. Thyroid function studies No results for input(s): TSH, T4TOTAL, T3FREE, THYROIDAB in the last 72 hours.  Invalid input(s): FREET3 Anemia work up No results for input(s): VITAMINB12, FOLATE, FERRITIN, TIBC, IRON, RETICCTPCT  in the last 72 hours. Urinalysis    Component Value Date/Time   COLORURINE STRAW (A) 09/16/2016 1710   APPEARANCEUR CLEAR 09/16/2016 1710   LABSPEC 1.010 09/16/2016 1710   PHURINE 8.0 09/16/2016 1710   GLUCOSEU >=500 (A) 09/16/2016 1710   HGBUR NEGATIVE 09/16/2016 1710   BILIRUBINUR NEGATIVE 09/16/2016 1710   KETONESUR 20 (A) 09/16/2016 1710   PROTEINUR NEGATIVE 09/16/2016 1710   UROBILINOGEN 0.2 12/05/2011 1655   NITRITE NEGATIVE 09/16/2016 1710   LEUKOCYTESUR NEGATIVE 09/16/2016 1710   Sepsis Labs Invalid input(s): PROCALCITONIN,  WBC,  LACTICIDVEN Microbiology No results found for this or any previous visit (from the past 240 hour(s)).   Time coordinating discharge: Over 45 minutes  SIGNED:   Tawni Millers, MD  [facility] 09/17/2016, 1:06 PM Pager   If 7PM-7AM, please contact night-coverage [email or web address] Password [email or web address]

## 2016-09-17 NOTE — Care Management Note (Signed)
Case Management Note  Patient Details  Name: Jesse Lane MRN: [id number] Date of Birth: [birth year]  Subjective/Objective:                  Jesse Lane is a 61 y.o. male with medical history significant for diabetes and related neuropathy, hypertension, melanoma undergoing chemotherapy who presented to ED with worsening CBGs as well as pain in the rectal area. Pt has had constipation for some time and has tried different meds with no significant symptomatic relief. Rectal pain is intermittent and no associated diarrhea. No nausea or vomiting. No fevers or chills. No chest pain, no palpitations or shortness of breath. No urinary complaints.    ED Course: In ED, pt was hemodynamically stable. BP was as high as 179/96. Blood work showed hgb 10.4, CO2 16, glucose on BMP 513, lactic acid 2.18.  Anion gap was 21. UA is pending so not sure about presence of ketones. Pt started on insulin drip and admitted to SDU.  Action/Plan: Date:  September 17, 2016 Chart reviewed for concurrent status and case management needs. Will continue to follow patient progress. Discharge Planning: following for needs Expected discharge date: [id number] Velva Harman, BSN, [place], [place] Expected Discharge Date:                  Expected Discharge Plan:  Home/Self Care  In-House Referral:     Discharge planning Services  CM Consult  Post Acute Care Choice:    Choice offered to:     DME Arranged:    DME Agency:     HH Arranged:    HH Agency:     Status of Service:  In process, will continue to follow  If discussed at Long Length of Stay Meetings, dates discussed:    Additional Comments:  Leeroy Cha, RN 09/17/2016, 9:49 AM

## 2016-09-17 NOTE — Progress Notes (Signed)
Initial Nutrition Assessment  DOCUMENTATION CODES:   Severe malnutrition in context of chronic illness, Underweight  INTERVENTION:  - Will order Ensure Enlive TID, each supplement provides 350 kcal and 20 grams of protein (will monitor CBGs closely). - Continue to encourage PO intakes of meals and supplements. - Will monitor for additional needs.  NUTRITION DIAGNOSIS:   Malnutrition (severe) related to chronic illness (uncontrolled DM, metastatic melanoma) as evidenced by severe depletion of muscle mass, severe depletion of body fat.  GOAL:   Patient will meet greater than or equal to 90% of their needs  MONITOR:   PO intake, Weight trends, Labs  REASON FOR ASSESSMENT:    (Low BMI)  ASSESSMENT:   61 y.o. male with medical history significant for diabetes and related neuropathy, hypertension, melanoma undergoing chemotherapy who presented to ED with worsening CBGs as well as pain in the rectal area. Pt has had constipation for some time and has tried different meds with no significant symptomatic relief. Rectal pain is intermittent and no associated diarrhea. No nausea or vomiting.   Pt seen for underweight BMI. No intakes documented since admission. Pt was last seen by an RD on 09/07/16. He has his last chemo treatment 4 days PTA and was admitted following 2 day hx of abdominal pain and constipation. Note from DM Coordinator this AM reviewed.   Physical assessment shows severe muscle and fat wasting throughout upper and lower body. Per chart review, he weighed 122 lbs on 5/6 and 129 lbs on 5/31. Will monitor weight trends closely throughout hospitalization.  Medications reviewed; sliding scale Novolog, 12 units Novolog x1 dose yesterday, 12 units Levemir BID, daily multivitamin with minerals, 40 mg Protonix/day, 1 packet Miralax BID, 10 mEq IV KCl x2 runs yesterday. Labs reviewed; CBGs: 36-125 mg/dL this AM (>500 mg/dL in the ED), K: 3.2 mmol/L, creatinine: 0.4 mg/dL, Ca: 8.5  mg/dL.   Diet Order:  Diet Carb Modified Fluid consistency: Thin; Room service appropriate? Yes  Skin:  Reviewed, no issues  Last BM:  6/10  Height:   Ht Readings from Last 1 Encounters:  09/16/16 5\' 11"  (1.803 m)    Weight:   Wt Readings from Last 1 Encounters:  09/16/16 126 lb 8.7 oz (57.4 kg)    Ideal Body Weight:     BMI:  Body mass index is 17.65 kg/m.  Estimated Nutritional Needs:   Kcal:  2010-2300 (35-40 kcal/kg)  Protein:  86-98 grams (1.5-1.7 grams/kg)  Fluid:  >/= 2 L/day  EDUCATION NEEDS:   No education needs identified at this time    Jarome Matin, MS, RD, LDN, CNSC Inpatient Clinical Dietitian Pager # [phone] After hours/weekend pager # [phone]

## 2016-09-17 NOTE — Progress Notes (Signed)
Hypoglycemic Event  CBG: 36  Treatment: D50 IV 50 mL  Symptoms: Sweaty  Follow-up CBG: Time: 0345 CBG Result: 125  Possible Reasons for Event: Unknown  Comments/MD notified:Dr Opyd    Teshaun Olarte, [employer] [place]

## 2016-10-08 ENCOUNTER — Ambulatory Visit: Payer: Self-pay | Admitting: Endocrinology

## 2016-11-04 ENCOUNTER — Emergency Department (HOSPITAL_COMMUNITY)
Admission: EM | Admit: 2016-11-04 | Discharge: 2016-11-05 | Disposition: A | Discharge status: Home / Self Care | Payer: BLUE CROSS/BLUE SHIELD | Attending: Emergency Medicine | Admitting: Emergency Medicine

## 2016-11-04 ENCOUNTER — Encounter (HOSPITAL_COMMUNITY): Payer: Self-pay | Admitting: Emergency Medicine

## 2016-11-04 DIAGNOSIS — E1110 Type 2 diabetes mellitus with ketoacidosis without coma: Secondary | ICD-10-CM | POA: Diagnosis not present

## 2016-11-04 DIAGNOSIS — E1165 Type 2 diabetes mellitus with hyperglycemia: Secondary | ICD-10-CM | POA: Diagnosis not present

## 2016-11-04 DIAGNOSIS — I10 Essential (primary) hypertension: Secondary | ICD-10-CM | POA: Diagnosis not present

## 2016-11-04 DIAGNOSIS — Z794 Long term (current) use of insulin: Secondary | ICD-10-CM | POA: Insufficient documentation

## 2016-11-04 DIAGNOSIS — K5900 Constipation, unspecified: Secondary | ICD-10-CM | POA: Insufficient documentation

## 2016-11-04 DIAGNOSIS — R739 Hyperglycemia, unspecified: Secondary | ICD-10-CM

## 2016-11-04 DIAGNOSIS — Z79899 Other long term (current) drug therapy: Secondary | ICD-10-CM | POA: Insufficient documentation

## 2016-11-04 DIAGNOSIS — Z87891 Personal history of nicotine dependence: Secondary | ICD-10-CM | POA: Diagnosis not present

## 2016-11-04 LAB — CBC WITH DIFFERENTIAL/PLATELET
Basophils Absolute: 0 10*3/uL (ref 0.0–0.1)
Basophils Relative: 0 %
EOS PCT: 0 %
Eosinophils Absolute: 0 10*3/uL (ref 0.0–0.7)
HCT: 34.8 % — ABNORMAL LOW (ref 39.0–52.0)
HEMOGLOBIN: 12.4 g/dL — AB (ref 13.0–17.0)
LYMPHS ABS: 0.5 10*3/uL — AB (ref 0.7–4.0)
LYMPHS PCT: 7 %
MCH: 29.6 pg (ref 26.0–34.0)
MCHC: 35.6 g/dL (ref 30.0–36.0)
MCV: 83.1 fL (ref 78.0–100.0)
MONOS PCT: 6 %
Monocytes Absolute: 0.4 10*3/uL (ref 0.1–1.0)
NEUTROS PCT: 87 %
Neutro Abs: 6.4 10*3/uL (ref 1.7–7.7)
Platelets: 136 10*3/uL — ABNORMAL LOW (ref 150–400)
RBC: 4.19 MIL/uL — AB (ref 4.22–5.81)
RDW: 14 % (ref 11.5–15.5)
WBC: 7.4 10*3/uL (ref 4.0–10.5)

## 2016-11-04 LAB — CBG MONITORING, ED: Glucose-Capillary: 432 mg/dL — ABNORMAL HIGH (ref 65–99)

## 2016-11-04 LAB — BASIC METABOLIC PANEL
Anion gap: 14 (ref 5–15)
BUN: 16 mg/dL (ref 6–20)
CHLORIDE: 100 mmol/L — AB (ref 101–111)
CO2: 23 mmol/L (ref 22–32)
Calcium: 9.1 mg/dL (ref 8.9–10.3)
Creatinine, Ser: 0.66 mg/dL (ref 0.61–1.24)
GFR calc Af Amer: >60 mL/min (ref >60)
GFR calc non Af Amer: >60 mL/min (ref >60)
GLUCOSE: 413 mg/dL — AB (ref 65–99)
POTASSIUM: 3.6 mmol/L (ref 3.5–5.1)
Sodium: 137 mmol/L (ref 135–145)

## 2016-11-04 MED ORDER — INSULIN ASPART 100 UNIT/ML ~~LOC~~ SOLN
10.0000 [IU] | Freq: Once | SUBCUTANEOUS | Status: AC
  Administered 2016-11-05: 10 [IU] via SUBCUTANEOUS
  Filled 2016-11-04: qty 1

## 2016-11-04 NOTE — ED Triage Notes (Signed)
Pt reports being constipated for the last 2 days along with nausea and vomiting.

## 2016-11-04 NOTE — ED Provider Notes (Signed)
[facility] DEPT Provider Note   CSN: [id number] Arrival date & time: 11/04/16  2057     History   Chief Complaint Chief Complaint  Patient presents with  . Constipation    HPI Jesse Lane is a 61 y.o. male.  Patient with PMH of DM, HTN, and Melanoma presents to the ED with a chief complaint of constipation.  He states that he has been getting constipated because of his chemotherapy for and axillary melanoma.  He states that he normally takes Miralax with good relief, but missed several doses and has not been able to have a BM in 2 days.  He reports associated discomfort.  He denies fevers, chills.  He states that he had one episode of vomiting the other day.  There are no other associated symptoms.   The history is provided by the patient. No language interpreter was used.    Past Medical History:  Diagnosis Date  . Cancer ([place])   . Diabetes mellitus type 2, uncontrolled ([place])   . Diabetic neuropathy ([place])   . History of stomach ulcers   . Hypertension     Patient Active Problem List   Diagnosis Date Noted  . Protein-calorie malnutrition, severe 09/17/2016  . DKA (diabetic ketoacidoses) ([place]) 09/17/2016  . Diabetic ketoacidosis without coma associated with type 2 diabetes mellitus ([place])   . Fecal impaction in rectum ([place])   . Hyperglycemia 09/16/2016  . Hyperglycemic hyperosmolar nonketotic coma ([place]) 09/16/2016  . High anion gap metabolic acidosis 45/06/8880  . Lactic acidosis 09/16/2016  . Anemia of chronic disease 09/16/2016  . Diabetic neuropathy ([place]) 09/06/2016  . Essential hypertension 09/06/2016  . Malignant melanoma of axilla ([place]) 08/12/2016    Past Surgical History:  Procedure Laterality Date  . COLONOSCOPY    . INGUINAL HERNIA REPAIR N/A 08/15/2015   Procedure: HERNIA REPAIR INGUINAL INCARCERATED;  Surgeon: Alphonsa Overall, MD;  Location: [facility]S;  Service: General;  Laterality: N/A;  with MESH  . pyloric stenosis    . TONSILLECTOMY          Home Medications    Prior to Admission medications   Medication Sig Start Date End Date Taking? Authorizing Provider  acetaminophen (TYLENOL) 500 MG tablet Take 500 mg by mouth every 6 (six) hours as needed for mild pain or headache.    [provider]  Cholecalciferol (VITAMIN D PO) Take 1 tablet by mouth daily with breakfast.    [provider]  Cyanocobalamin (VITAMIN B-12 PO) Take 2 tablets by mouth daily with breakfast.    [provider]  feeding supplement, ENSURE ENLIVE, (ENSURE ENLIVE) LIQD Take 237 mLs by mouth 3 (three) times daily between meals. 09/17/16   [name], [name], MD  insulin aspart (NOVOLOG FLEXPEN) 100 UNIT/ML FlexPen For glucose 150-200 give 2 units, 201-250 give 4 units, 251-300 give 6 units, 301-350 give 8 units, 351 or greater give 10 units, 09/17/16   [name], [name], MD  Insulin Detemir (LEVEMIR) 100 UNIT/ML Pen Inject 12 Units into the skin 2 (two) times daily. 09/07/16   Barton Dubois, MD  lisinopril (PRINIVIL,ZESTRIL) 20 MG tablet Take 20 mg by mouth daily with breakfast.  07/24/16   [provider]  LYRICA 100 MG capsule Take 100-200 mg by mouth 2 (two) times daily. Take 100 mg in the morning and 200 mg in the evening 07/27/16   [provider]  Multiple Vitamin (MULTIVITAMIN WITH MINERALS) TABS tablet Take 1 tablet by mouth daily with breakfast.  [provider]  omeprazole (PRILOSEC) 40 MG capsule Take 1 capsule (40 mg total) by mouth daily with breakfast. 09/17/16 10/17/16  [name], [name], MD  ondansetron (ZOFRAN) 4 MG tablet Take 1 tablet (4 mg total) by mouth every 6 (six) hours as needed for nausea or vomiting. 09/17/16 09/17/17  [name], [name], MD  oxyCODONE-acetaminophen (PERCOCET/ROXICET) 5-325 MG tablet Take 1 tablet by mouth at bedtime.    [provider]  polyethylene glycol (MIRALAX / GLYCOLAX) packet Take 17 g by mouth 2 (two) times daily. 09/17/16    [name], [name], MD  traZODone (DESYREL) 100 MG tablet Take 100 mg by mouth at bedtime. 07/24/16   [provider]    Family History Family History  Problem Relation Age of Onset  . Breast cancer Mother   . Bone cancer Mother   . Melanoma Mother   . Prostate cancer Father   . Diabetes Neg Hx     Social History Social History  Substance Use Topics  . Smoking status: Former Smoker    Packs/day: 1.00    Years: 5.00    Types: Cigarettes  . Smokeless tobacco: Never Used  . Alcohol use No     Comment: daily     Allergies   Patient has no known allergies.   Review of Systems Review of Systems  All other systems reviewed and are negative.    Physical Exam Updated Vital Signs BP (!) 202/97 (BP Location: Left Arm)   Pulse 84   Temp 98.3 F (36.8 C) (Oral)   Resp 19   Ht 5\' 11"  (1.803 m)   Wt 63.5 kg (140 lb)   SpO2 100%   BMI 19.53 kg/m   Physical Exam  Constitutional: He is oriented to person, place, and time. He appears well-developed and well-nourished.  HENT:  Head: Normocephalic and atraumatic.  Eyes: Pupils are equal, round, and reactive to light. Conjunctivae and EOM are normal. Right eye exhibits no discharge. Left eye exhibits no discharge. No scleral icterus.  Neck: Normal range of motion. Neck supple. No JVD present.  Cardiovascular: Normal rate, regular rhythm and normal heart sounds.  Exam reveals no gallop and no friction rub.   No murmur heard. Pulmonary/Chest: Effort normal and breath sounds normal. No respiratory distress. He has no wheezes. He has no rales. He exhibits no tenderness.  Abdominal: Soft. He exhibits no distension and no mass. There is no tenderness. There is no rebound and no guarding.  Genitourinary:  Genitourinary Comments: Fecal impaction Chaperone present  Musculoskeletal: Normal range of motion. He exhibits no edema or tenderness.  Neurological: He is alert and oriented to person, place, and time.  Skin: Skin  is warm and dry.  Psychiatric: He has a normal mood and affect. His behavior is normal. Judgment and thought content normal.  Nursing note and vitals reviewed.    ED Treatments / Results  Labs (all labs ordered are listed, but only abnormal results are displayed) Labs Reviewed - No data to display  EKG  EKG Interpretation None       Radiology No results found.  Procedures Fecal disimpaction Date/Time: 11/04/2016 10:40 PM Performed by: Montine Circle Authorized by: Montine Circle  Consent: Verbal consent obtained. Risks and benefits: risks, benefits and alternatives were discussed Consent given by: patient Patient understanding: patient states understanding of the procedure being performed Patient consent: the patient's understanding of the procedure matches consent given Procedure consent: procedure consent matches procedure scheduled Relevant documents: relevant documents present  and verified Test results: test results available and properly labeled Site marked: the operative site was marked Imaging studies: imaging studies available Required items: required blood products, implants, devices, and special equipment available Patient identity confirmed: verbally with patient Time out: Immediately prior to procedure a "time out" was called to verify the correct patient, procedure, equipment, support staff and site/side marked as required. Preparation: Patient was prepped and draped in the usual sterile fashion. Local anesthesia used: no  Anesthesia: Local anesthesia used: no  Sedation: Patient sedated: no Patient tolerance: Patient tolerated the procedure well with no immediate complications Comments: Large amount of stool removed from rectal vault.  Patient tolerated well.    (including critical care time)  Medications Ordered in ED Medications - No data to display   Initial Impression / Assessment and Plan / ED Course  I have reviewed the triage vital  signs and the nursing notes.  Pertinent labs & imaging results that were available during my care of the patient were reviewed by me and considered in my medical decision making (see chart for details).    Patient presents with chief complaint of constipation. He had a large amount of fecal impaction on exam. This was disimpacted by me. Patient tolerated the procedure well. He later said that he felt like his blood sugar was running high. It was elevated, and came down nicely with insulin. He was not acidotic. His vital signs stable. He is in no apparent distress, and can be discharged with outpatient follow-up. I have advised patient to continue MiraLAX.  Final Clinical Impressions(s) / ED Diagnoses   Final diagnoses:  Constipation, unspecified constipation type  Hyperglycemia    New Prescriptions New Prescriptions   No medications on file     Montine Circle, Hershal Coria 11/05/16 [id number]    Milton Ferguson, MD 11/05/16 [phone]

## 2016-11-05 LAB — CBG MONITORING, ED: GLUCOSE-CAPILLARY: 288 mg/dL — AB (ref 65–99)

## 2016-11-05 NOTE — Discharge Instructions (Signed)
Please take the miralax daily.  Please follow-up with your doctor ASAP.

## 2016-11-26 ENCOUNTER — Ambulatory Visit (INDEPENDENT_AMBULATORY_CARE_PROVIDER_SITE_OTHER): Payer: BLUE CROSS/BLUE SHIELD | Admitting: Endocrinology

## 2016-11-26 ENCOUNTER — Encounter: Payer: Self-pay | Admitting: Endocrinology

## 2016-11-26 VITALS — BP 142/82 | HR 73 | Wt 146.0 lb

## 2016-11-26 DIAGNOSIS — E041 Nontoxic single thyroid nodule: Secondary | ICD-10-CM

## 2016-11-26 DIAGNOSIS — Z794 Long term (current) use of insulin: Secondary | ICD-10-CM | POA: Insufficient documentation

## 2016-11-26 DIAGNOSIS — E1110 Type 2 diabetes mellitus with ketoacidosis without coma: Secondary | ICD-10-CM

## 2016-11-26 DIAGNOSIS — E1042 Type 1 diabetes mellitus with diabetic polyneuropathy: Secondary | ICD-10-CM

## 2016-11-26 DIAGNOSIS — E119 Type 2 diabetes mellitus without complications: Secondary | ICD-10-CM | POA: Insufficient documentation

## 2016-11-26 LAB — POCT GLYCOSYLATED HEMOGLOBIN (HGB A1C): Hemoglobin A1C: 7.3

## 2016-11-26 MED ORDER — INSULIN DETEMIR 100 UNIT/ML FLEXPEN: 10.0000 [IU] | PEN_INJECTOR | Freq: Two times a day (BID) | SUBCUTANEOUS | 1 refills | Status: DC

## 2016-11-26 MED ORDER — INSULIN ASPART 100 UNIT/ML FLEXPEN: 8.0000 [IU] | PEN_INJECTOR | Freq: Three times a day (TID) | SUBCUTANEOUS | 2 refills | Status: DC

## 2016-11-26 NOTE — Progress Notes (Signed)
Subjective:    Patient ID: Jesse Lane, male    DOB: [birth year], 61 y.o.   MRN: [id number]  HPI pt is referred by R. Sabra Heck, PA, for diabetes.  Pt states DM was dx'ed in 2010; he has mild neuropathy of the lower extremities, and associated DR; he has been on insulin since early 2018; pt says his diet is good, but exercise is limited by health probs; he has never had pancreatitis or pancreatic surgery.  Last episode of severe hypoglycemia was 3 weeks ago.  This happens in the early hrs of the morning.  He says cbg's are higher as the day goes on.  He has had multiple episodes of DKA (most recently 3 times in 2018).  He takes multiple daily injections.  He takes levemir, 12 units BID, and novolog 7 units 3 times a day (just before each meal).  He says he never misses the insulin.  He sometimes takes extra levemir at HS, if cbg is high.   Past Medical History:  Diagnosis Date  . Cancer ([place])   . Diabetes mellitus type 2, uncontrolled ([place])   . Diabetic neuropathy ([place])   . History of stomach ulcers   . Hypertension     Past Surgical History:  Procedure Laterality Date  . COLONOSCOPY    . INGUINAL HERNIA REPAIR N/A 08/15/2015   Procedure: HERNIA REPAIR INGUINAL INCARCERATED;  Surgeon: Alphonsa Overall, MD;  Location: [facility];  Service: General;  Laterality: N/A;  with MESH  . pyloric stenosis    . TONSILLECTOMY      Social History   Social History  . Marital status: Single    Spouse name: N/A  . Number of children: N/A  . Years of education: N/A   Occupational History  . Not on file.   Social History Main Topics  . Smoking status: Former Smoker    Packs/day: 1.00    Years: 5.00    Types: Cigarettes  . Smokeless tobacco: Never Used  . Alcohol use No     Comment: daily  . Drug use: Yes    Types: Marijuana     Comment: daily  . Sexual activity: No   Other Topics Concern  . Not on file   Social History Narrative  . No narrative on file    Current Outpatient  Prescriptions on File Prior to Visit  Medication Sig Dispense Refill  . feeding supplement, ENSURE ENLIVE, (ENSURE ENLIVE) LIQD Take 237 mLs by mouth 3 (three) times daily between meals. 237 mL 12  . lisinopril (PRINIVIL,ZESTRIL) 20 MG tablet Take 20 mg by mouth daily with breakfast.   0  . LYRICA 100 MG capsule Take 200 mg by mouth at bedtime.   0  . Multiple Vitamin (MULTIVITAMIN WITH MINERALS) TABS tablet Take 1 tablet by mouth daily with breakfast.    . ondansetron (ZOFRAN) 4 MG tablet Take 1 tablet (4 mg total) by mouth every 6 (six) hours as needed for nausea or vomiting. 20 tablet 0  . oxyCODONE-acetaminophen (PERCOCET/ROXICET) 5-325 MG tablet Take 1 tablet by mouth at bedtime.    . polyethylene glycol (MIRALAX / GLYCOLAX) packet Take 17 g by mouth 2 (two) times daily. (Patient taking differently: Take 17 g by mouth daily as needed for moderate constipation. ) 14 each 0  . traZODone (DESYREL) 100 MG tablet Take 100 mg by mouth at bedtime.  0  . Vitamin D, Ergocalciferol, (DRISDOL) 50000 units CAPS capsule Take 50,000 Units by mouth every 7 (  seven) days.   0  . acetaminophen (TYLENOL) 500 MG tablet Take 500 mg by mouth every 6 (six) hours as needed for mild pain or headache.    Marland Kitchen omeprazole (PRILOSEC) 40 MG capsule Take 1 capsule (40 mg total) by mouth daily with breakfast. (Patient taking differently: Take 40 mg by mouth daily as needed. ) 30 capsule 0   No current facility-administered medications on file prior to visit.     No Known Allergies  Family History  Problem Relation Age of Onset  . Breast cancer Mother   . Bone cancer Mother   . Melanoma Mother   . Prostate cancer Father   . Diabetes Neg Hx     BP (!) 142/82   Pulse 73   Wt 146 lb (66.2 kg)   SpO2 98%   BMI 20.36 kg/m    Review of Systems denies blurry vision, headache, chest pain, sob, n/v, urinary frequency, muscle cramps, memory loss, depression, rhinorrhea, and easy bruising.  He has lost 10 lbs since dx of  DM.  He has night sweats and cold intolerance.    Objective:   Physical Exam VS: see vs page GEN: no distress HEAD: head: no deformity eyes: no periorbital swelling, no proptosis external nose and ears are normal mouth: no lesion seen NECK: supple, thyroid is not enlarged, but 2 cm right thyroid nodule is palpable CHEST WALL: no deformity LUNGS: clear to auscultation CV: reg rate and rhythm, no murmur ABD: abdomen is soft, nontender.  no hepatosplenomegaly.  not distended.  no hernia MUSCULOSKELETAL: muscle bulk and strength are grossly normal.  no obvious joint swelling.  gait is steady with a cane EXTEMITIES: no deformity.  no ulcer on the feet.  feet are of normal color and temp.  1+ bilat leg edema PULSES: dorsalis pedis intact bilat.  no carotid bruit.   NEURO:  cn 2-12 grossly intact.   readily moves all 4's.  sensation is intact to touch on the feet SKIN:  Normal texture and temperature.  No rash or suspicious lesion is visible.   NODES:  None palpable at the neck PSYCH: alert, well-oriented.  Does not appear anxious nor depressed.    Lab Results  Component Value Date   CREATININE 0.66 11/04/2016   BUN 16 11/04/2016   NA 137 11/04/2016   K 3.6 11/04/2016   CL 100 (L) 11/04/2016   CO2 23 11/04/2016   A1c=7.3%  I personally reviewed electrocardiogram tracing (09/06/16): Indication: DKA Impression: NSR with PAC's.  No MI.  No hypertrophy. Compared to 08/12/16: no significant change     Assessment & Plan:  Type 1 DM, with DR, new to me. DKA, recurrent: this is evidence of noncompliance wit insulin.    Patient Instructions  good diet and exercise significantly improve the control of your diabetes.  please let me know if you wish to be referred to a dietician.  high blood sugar is very risky to your health.  you should see an eye doctor and dentist every year.  It is very important to get all recommended vaccinations.  Controlling your blood pressure and cholesterol  drastically reduces the damage diabetes does to your body.  Those who smoke should quit.  Please discuss these with your doctor.  check your blood sugar 10 times a day: before the 3 meals, and at bedtime.  also check if you have symptoms of your blood sugar being too high or too low.  please keep a record of the readings  and bring it to your next appointment here (or you can bring the meter itself).  You can write it on any piece of paper.  please call us sooner if your blood sugar goes below 70, or if you have a lot of readings over 200.  Please change the insulin to the numbers listed below.  Try to to take extra, so we can see the trend througout the day.  Please come back for a follow-up appointment in 1 month.  Please call or message [place] next week, to tell us how the blood sugar is doing.

## 2016-11-26 NOTE — Patient Instructions (Addendum)
good diet and exercise significantly improve the control of your diabetes.  please let me know if you wish to be referred to a dietician.  high blood sugar is very risky to your health.  you should see an eye doctor and dentist every year.  It is very important to get all recommended vaccinations.  Controlling your blood pressure and cholesterol drastically reduces the damage diabetes does to your body.  Those who smoke should quit.  Please discuss these with your doctor.  check your blood sugar 10 times a day: before the 3 meals, and at bedtime.  also check if you have symptoms of your blood sugar being too high or too low.  please keep a record of the readings and bring it to your next appointment here (or you can bring the meter itself).  You can write it on any piece of paper.  please call us sooner if your blood sugar goes below 70, or if you have a lot of readings over 200.  Please change the insulin to the numbers listed below.  Try to to take extra, so we can see the trend througout the day.  Please come back for a follow-up appointment in 1 month.  Please call or message [place] next week, to tell us how the blood sugar is doing.

## 2016-11-28 ENCOUNTER — Other Ambulatory Visit: Payer: Self-pay

## 2016-11-28 ENCOUNTER — Encounter: Payer: BLUE CROSS/BLUE SHIELD | Attending: Physician Assistant | Admitting: Nutrition

## 2016-11-28 DIAGNOSIS — Z713 Dietary counseling and surveillance: Secondary | ICD-10-CM | POA: Insufficient documentation

## 2016-11-28 DIAGNOSIS — Z794 Long term (current) use of insulin: Secondary | ICD-10-CM

## 2016-11-28 DIAGNOSIS — E1042 Type 1 diabetes mellitus with diabetic polyneuropathy: Secondary | ICD-10-CM | POA: Insufficient documentation

## 2016-11-28 DIAGNOSIS — E119 Type 2 diabetes mellitus without complications: Secondary | ICD-10-CM

## 2016-11-28 MED ORDER — GLUCOSE BLOOD VI STRP: ORAL_STRIP | 3 refills | Status: DC

## 2016-11-28 MED ORDER — FREESTYLE LIBRE SENSOR SYSTEM MISC: 3 refills | Status: DC

## 2016-11-28 MED ORDER — FREESTYLE LIBRE READER DEVI: 1.0000 | Freq: Every day | 0 refills | Status: DC

## 2016-11-28 NOTE — Patient Instructions (Signed)
Insert Libre sensor once every 10 days. Call if questions. Stop all cereal and milk for breakfast.

## 2016-11-28 NOTE — Progress Notes (Signed)
Pt. Was shown the 2 CGMs--ones with alarms and ones without.  He wants the [employer]. We discussed the difference between sensor readings and blood sugar readings.  He  Reported good understanding of this.  He was shown how to set date/time in reader, and how to insert the sensor.  He had no final questions. Pt. Did not bring meter, and says FBS today was 140.Marland Kitchen Said did not take Novolog because blood sugar was "low, and did not want it to drop".  Blood sugar acL today was 240.  Discussed that Novolog is take to prevent the blood sugar from going high after you eat, and should not drop blood sugar, if ac reading is 140.  Also discussed other options for breakfast that include protein and some fat.  He was agreeable with eating other thing for breakfast.   Will do his last Chemo tomorrow and "then be done with all Chemo".   Stressed need to take Novolog before all meals and to stop eating cold cereal and milk for breakfast.   Discussed the need for protein and a little fat at each meal, and why they are needed.  He reported good understanding of this with no final questions

## 2016-12-04 ENCOUNTER — Ambulatory Visit
Admission: RE | Admit: 2016-12-04 | Discharge: 2016-12-04 | Disposition: A | Discharge status: Home / Self Care | Payer: BLUE CROSS/BLUE SHIELD | Source: Ambulatory Visit | Attending: Endocrinology | Admitting: Endocrinology

## 2016-12-04 DIAGNOSIS — E041 Nontoxic single thyroid nodule: Secondary | ICD-10-CM

## 2016-12-20 DIAGNOSIS — C772 Secondary and unspecified malignant neoplasm of intra-abdominal lymph nodes: Secondary | ICD-10-CM | POA: Insufficient documentation

## 2016-12-27 ENCOUNTER — Ambulatory Visit (INDEPENDENT_AMBULATORY_CARE_PROVIDER_SITE_OTHER): Payer: BLUE CROSS/BLUE SHIELD | Admitting: Endocrinology

## 2016-12-27 ENCOUNTER — Encounter: Payer: Self-pay | Admitting: Endocrinology

## 2016-12-27 DIAGNOSIS — E042 Nontoxic multinodular goiter: Secondary | ICD-10-CM

## 2016-12-27 MED ORDER — INSULIN ASPART 100 UNIT/ML FLEXPEN: 9.0000 [IU] | PEN_INJECTOR | Freq: Three times a day (TID) | SUBCUTANEOUS | 2 refills | Status: DC

## 2016-12-27 NOTE — Progress Notes (Signed)
Subjective:    Patient ID: Jesse Lane, male    DOB: [birth year], 61 y.o.   MRN: [id number]  HPI Pt returns for f/u of diabetes mellitus: DM type:  Dx'ed: 8185 Complications: polyneuropathy and DR Therapy: insulin since early 2018 DKA: most recently 3 times in 2018. Severe hypoglycemia: last episode was mid-2018 Pancreatitis: never Pancreatic imaging:  Other: he takes multiple daily injections Interval history: no cbg record (he also did not bring reader), but states cbg's vary from 88-300, but most are in the 100's. It is in general lowest in the middle of the night.  He takes novolog 10 units 3 times a day (just before each meal), and levemir, 10 units BID.   Past Medical History:  Diagnosis Date  . Cancer ([place])   . Diabetes mellitus type 2, uncontrolled ([place])   . Diabetic neuropathy ([place])   . History of stomach ulcers   . Hypertension     Past Surgical History:  Procedure Laterality Date  . COLONOSCOPY    . INGUINAL HERNIA REPAIR N/A 08/15/2015   Procedure: HERNIA REPAIR INGUINAL INCARCERATED;  Surgeon: Alphonsa Overall, MD;  Location: [facility];  Service: General;  Laterality: N/A;  with MESH  . pyloric stenosis    . TONSILLECTOMY      Social History   Social History  . Marital status: Single    Spouse name: N/A  . Number of children: N/A  . Years of education: N/A   Occupational History  . Not on file.   Social History Main Topics  . Smoking status: Former Smoker    Packs/day: 1.00    Years: 5.00    Types: Cigarettes  . Smokeless tobacco: Never Used  . Alcohol use No     Comment: daily  . Drug use: Yes    Types: Marijuana     Comment: daily  . Sexual activity: No   Other Topics Concern  . Not on file   Social History Narrative  . No narrative on file    Current Outpatient Prescriptions on File Prior to Visit  Medication Sig Dispense Refill  . acetaminophen (TYLENOL) 500 MG tablet Take 500 mg by mouth every 6 (six) hours as needed for mild pain or  headache.    . Continuous Blood Gluc Receiver (FREESTYLE LIBRE READER) DEVI 1 each by Does not apply route daily. Used to check blood sugars 10x daily. 1 Device 0  . Continuous Blood Gluc Sensor ([place]) MISC Used to check blood sugars 10x daily. 3 each 3  . feeding supplement, ENSURE ENLIVE, (ENSURE ENLIVE) LIQD Take 237 mLs by mouth 3 (three) times daily between meals. 237 mL 12  . glucose blood (FREESTYLE PRECISION NEO TEST) test strip Use as instructed 50 each 3  . Insulin Detemir (LEVEMIR) 100 UNIT/ML Pen Inject 10 Units into the skin 2 (two) times daily. 15 mL 1  . lisinopril (PRINIVIL,ZESTRIL) 20 MG tablet Take 20 mg by mouth daily with breakfast.   0  . LYRICA 100 MG capsule Take 200 mg by mouth at bedtime.   0  . Multiple Vitamin (MULTIVITAMIN WITH MINERALS) TABS tablet Take 1 tablet by mouth daily with breakfast.    . omeprazole (PRILOSEC) 40 MG capsule Take 1 capsule (40 mg total) by mouth daily with breakfast. (Patient taking differently: Take 40 mg by mouth daily as needed. ) 30 capsule 0  . ondansetron (ZOFRAN) 4 MG tablet Take 1 tablet (4 mg total) by mouth every 6 (  six) hours as needed for nausea or vomiting. 20 tablet 0  . oxyCODONE-acetaminophen (PERCOCET/ROXICET) 5-325 MG tablet Take 1 tablet by mouth at bedtime.    . polyethylene glycol (MIRALAX / GLYCOLAX) packet Take 17 g by mouth 2 (two) times daily. (Patient taking differently: Take 17 g by mouth daily as needed for moderate constipation. ) 14 each 0  . traZODone (DESYREL) 100 MG tablet Take 100 mg by mouth at bedtime.  0  . Vitamin D, Ergocalciferol, (DRISDOL) 50000 units CAPS capsule Take 50,000 Units by mouth every 7 (seven) days.   0   No current facility-administered medications on file prior to visit.     No Known Allergies  Family History  Problem Relation Age of Onset  . Breast cancer Mother   . Bone cancer Mother   . Melanoma Mother   . Prostate cancer Father   . Diabetes Neg Hx      BP (!) 158/98   Pulse 80   Wt 145 lb 12.8 oz (66.1 kg)   SpO2 98%   BMI 20.33 kg/m    Review of Systems Denies LOC.      Objective:   Physical Exam VITAL SIGNS:  See vs page GENERAL: no distress Pulses: foot pulses are intact bilaterally.   MSK: no deformity of the feet or ankles.  CV: 1+ bilat edema of the legs or ankles Skin:  no ulcer on the feet or ankles.  normal color and temp on the feet and ankles Neuro: sensation is intact to touch on the feet and ankles, but decreased from normal     Lab Results  Component Value Date   HGBA1C 7.3 11/26/2016      Assessment & Plan:  Type 1 DM, with DR: The pattern of his cbg's indicates he needs some adjustment in his therapy, but he declines to change.   Patient Instructions  check your blood sugar 10 times a day: before the 3 meals, and at bedtime.  also check if you have symptoms of your blood sugar being too high or too low.  please keep a record of the readings and bring it to your next appointment here (or you can bring the meter itself).  You can write it on any piece of paper.  please call us sooner if your blood sugar goes below 70, or if you have a lot of readings over 200.  Please change the insulin to the numbers listed below.  Try not to to take extra, so we can see the trend througout the day.  Please come back for a follow-up appointment in 2 months.

## 2016-12-27 NOTE — Patient Instructions (Addendum)
check your blood sugar 10 times a day: before the 3 meals, and at bedtime.  also check if you have symptoms of your blood sugar being too high or too low.  please keep a record of the readings and bring it to your next appointment here (or you can bring the meter itself).  You can write it on any piece of paper.  please call us sooner if your blood sugar goes below 70, or if you have a lot of readings over 200.  Please change the insulin to the numbers listed below.  Try not to to take extra, so we can see the trend througout the day.  Please come back for a follow-up appointment in 2 months.

## 2017-01-08 ENCOUNTER — Other Ambulatory Visit: Payer: Self-pay

## 2017-01-08 ENCOUNTER — Telehealth: Payer: Self-pay | Admitting: *Deleted

## 2017-01-08 MED ORDER — INSULIN ASPART 100 UNIT/ML FLEXPEN
9.0000 [IU] | PEN_INJECTOR | Freq: Three times a day (TID) | SUBCUTANEOUS | 2 refills | Status: DC
Start: 2017-01-08 — End: 2017-02-11

## 2017-01-08 MED ORDER — INSULIN DETEMIR 100 UNIT/ML FLEXPEN: 10.0000 [IU] | PEN_INJECTOR | Freq: Two times a day (BID) | SUBCUTANEOUS | 1 refills | Status: DC

## 2017-01-08 MED ORDER — INSULIN ASPART 100 UNIT/ML FLEXPEN: 9.0000 [IU] | PEN_INJECTOR | Freq: Three times a day (TID) | SUBCUTANEOUS | 2 refills | Status: DC

## 2017-01-08 NOTE — Telephone Encounter (Signed)
Called patient & resent his prescriptions to pharmacy for more pens monthly. He will also be by tomorrow to pick up two pens to get him through until the 5th when insurance will pay for more insulin.

## 2017-01-08 NOTE — Telephone Encounter (Signed)
Patient called and states he needs a refill of his medication . Patient states Dr. Loanne Drilling changed the dosage on his Novolog and it is requiring a prior auth. Patient is completely out of the medication.  He also needs a refill of his Levemir. This medication needs a prior auth as well. [facility] [address]. Please advise. Thank you

## 2017-02-11 ENCOUNTER — Other Ambulatory Visit: Payer: Self-pay

## 2017-02-11 ENCOUNTER — Telehealth: Payer: Self-pay | Admitting: Endocrinology

## 2017-02-11 MED ORDER — INSULIN ASPART 100 UNIT/ML FLEXPEN
9.0000 [IU] | PEN_INJECTOR | Freq: Three times a day (TID) | SUBCUTANEOUS | 2 refills | Status: DC
Start: 2017-02-11 — End: 2017-06-03

## 2017-02-11 MED ORDER — INSULIN DETEMIR 100 UNIT/ML FLEXPEN: 10.0000 [IU] | PEN_INJECTOR | Freq: Two times a day (BID) | SUBCUTANEOUS | 1 refills | Status: DC

## 2017-02-11 NOTE — Telephone Encounter (Signed)
Called patient & luckily his GP sent in prescription yesterday so he wouldn't be out of Levemir. Since patient comes in regularly I sent in both insulins in for him to his pharmacy.

## 2017-02-11 NOTE — Telephone Encounter (Signed)
Patient stated he is down to his last dose of insulin. Pharmacy is wating for the doctor to  Approve the refill of insulin.  Patient called the [facility], did not say what type insulin was

## 2017-02-26 ENCOUNTER — Ambulatory Visit: Payer: Self-pay | Admitting: Endocrinology

## 2017-04-11 ENCOUNTER — Other Ambulatory Visit: Payer: Self-pay | Admitting: Endocrinology

## 2017-05-01 ENCOUNTER — Ambulatory Visit: Payer: Self-pay | Admitting: Endocrinology

## 2017-06-03 ENCOUNTER — Other Ambulatory Visit: Payer: Self-pay | Admitting: Endocrinology

## 2017-06-20 ENCOUNTER — Other Ambulatory Visit: Payer: Self-pay | Admitting: Endocrinology

## 2017-08-06 ENCOUNTER — Other Ambulatory Visit: Payer: Self-pay

## 2017-08-06 MED ORDER — INSULIN DETEMIR 100 UNIT/ML FLEXPEN: 10.0000 [IU] | PEN_INJECTOR | Freq: Two times a day (BID) | SUBCUTANEOUS | 1 refills | Status: DC

## 2017-08-08 ENCOUNTER — Telehealth: Payer: Self-pay | Admitting: Endocrinology

## 2017-08-08 ENCOUNTER — Other Ambulatory Visit: Payer: Self-pay

## 2017-08-08 MED ORDER — INSULIN DETEMIR 100 UNIT/ML FLEXPEN: 10.0000 [IU] | PEN_INJECTOR | Freq: Two times a day (BID) | SUBCUTANEOUS | 1 refills | Status: DC

## 2017-08-08 NOTE — Telephone Encounter (Signed)
Patient stated the pharmacy have not received medication Original Order:  Insulin Detemir (LEVEMIR) 100 UNIT/ML Pen [[id number]]    Pharmacy:  [facility], [address]

## 2017-08-08 NOTE — Telephone Encounter (Signed)
The first prescription printed so I have sent over electronically.

## 2017-08-25 ENCOUNTER — Other Ambulatory Visit: Payer: Self-pay | Admitting: Endocrinology

## 2017-08-25 NOTE — Telephone Encounter (Signed)
Please refill x 1 Ov is due  

## 2017-08-26 ENCOUNTER — Telehealth: Payer: Self-pay | Admitting: Endocrinology

## 2017-08-26 ENCOUNTER — Other Ambulatory Visit: Payer: Self-pay

## 2017-08-26 MED ORDER — FREESTYLE LIBRE READER DEVI: 1.0000 | Freq: Every day | 0 refills | Status: DC

## 2017-08-26 NOTE — Telephone Encounter (Signed)
°  Continuous Blood Gluc Receiver (FREESTYLE LIBRE READER) DEVI  Patient needs the 14 day reader sent into his pharmacy.    [place], [place]

## 2017-08-26 NOTE — Telephone Encounter (Signed)
I have sent to patient;'s pharmacy.  

## 2017-08-27 ENCOUNTER — Other Ambulatory Visit: Payer: Self-pay

## 2017-08-27 MED ORDER — FREESTYLE LIBRE 14 DAY READER DEVI: 1.0000 | Freq: Every day | 0 refills | Status: DC | PRN

## 2017-08-27 MED ORDER — FREESTYLE LIBRE 14 DAY SENSOR MISC: 1.0000 | Freq: Every day | 4 refills | Status: DC

## 2017-08-29 ENCOUNTER — Other Ambulatory Visit: Payer: Self-pay

## 2017-08-29 ENCOUNTER — Telehealth: Payer: Self-pay | Admitting: Endocrinology

## 2017-08-29 MED ORDER — INSULIN PEN NEEDLE 32G X 6 MM MISC: 2 refills | Status: DC

## 2017-08-29 NOTE — Telephone Encounter (Signed)
I have changed prescription & sent to pharmacy.

## 2017-08-29 NOTE — Telephone Encounter (Signed)
Patient stated that on his pen needles its states to use 3 times a day, Patient is using them 6 times a day due to having 2 different insulins.  He would like to see if this can be adjust for him so he will have enough pen needles. He has been paying out of pocket for the extra he has been needing  Please advise

## 2017-09-21 ENCOUNTER — Other Ambulatory Visit: Payer: Self-pay | Admitting: Endocrinology

## 2017-09-27 ENCOUNTER — Other Ambulatory Visit: Payer: Self-pay

## 2017-09-27 ENCOUNTER — Telehealth: Payer: Self-pay | Admitting: Endocrinology

## 2017-09-27 MED ORDER — GLUCOSE BLOOD VI STRP: ORAL_STRIP | 3 refills | Status: DC

## 2017-09-27 MED ORDER — INSULIN DETEMIR 100 UNIT/ML FLEXPEN: 10.0000 [IU] | PEN_INJECTOR | Freq: Two times a day (BID) | SUBCUTANEOUS | 1 refills | Status: DC

## 2017-09-27 NOTE — Telephone Encounter (Signed)
I have sent a prescription for 150 strips to test 3 times daily to requested pharmacy.

## 2017-09-27 NOTE — Telephone Encounter (Addendum)
Patient stated that he do not have enough medication to last him throughout the month  He test 12 times  3 x a day  Send to   [place], [place]

## 2017-09-27 NOTE — Telephone Encounter (Signed)
Patient is stating he needs the levemir to be resent he is stating he is taking it 3 times a day, 10 u the rx called in is for 2 times a day and it does not say in the chart he is supposed to be taking it that way. Please advise if we can call it in it needs to be to [facility] [address]

## 2017-09-28 NOTE — Telephone Encounter (Signed)
1. Ov is due 2.  In the meantime, for your safety, I need to know how much of each insulin you are currently taking.

## 2017-09-30 ENCOUNTER — Other Ambulatory Visit: Payer: Self-pay

## 2017-09-30 ENCOUNTER — Telehealth: Payer: Self-pay | Admitting: Endocrinology

## 2017-09-30 MED ORDER — INSULIN DETEMIR 100 UNIT/ML FLEXPEN: 10.0000 [IU] | PEN_INJECTOR | Freq: Three times a day (TID) | SUBCUTANEOUS | 0 refills | Status: DC

## 2017-09-30 NOTE — Telephone Encounter (Signed)
I have notified patient that prescription was sent in & made f/u appointment for July 19th.

## 2017-09-30 NOTE — Telephone Encounter (Signed)
Patient stated he called last Friday about his Levemir and has not heard back from our office. Patient would like a call back.

## 2017-09-30 NOTE — Telephone Encounter (Signed)
Patient is also completely out of levemir & I forgot to include that in last note.

## 2017-09-30 NOTE — Telephone Encounter (Signed)
OK, I have sent a prescription to your pharmacy Ov is due

## 2017-09-30 NOTE — Addendum Note (Signed)
Addended by: Renato Shin on: 09/30/2017 11:38 AM   Modules accepted: Orders

## 2017-09-30 NOTE — Telephone Encounter (Signed)
Patient states that he only sees Levemir lasting 6-8 hours so depending on blood sugar numbers he takes a third shot. He does not do this everyday & wanted advice on how much to increase the two shots to? He has been running out early the last few months. Please advise?

## 2017-10-25 ENCOUNTER — Ambulatory Visit: Payer: BLUE CROSS/BLUE SHIELD | Admitting: Endocrinology

## 2017-10-25 ENCOUNTER — Encounter: Payer: Self-pay | Admitting: Endocrinology

## 2017-10-25 VITALS — BP 142/92 | HR 74 | Ht 71.0 in | Wt 158.2 lb

## 2017-10-25 DIAGNOSIS — E1042 Type 1 diabetes mellitus with diabetic polyneuropathy: Secondary | ICD-10-CM

## 2017-10-25 LAB — POCT GLYCOSYLATED HEMOGLOBIN (HGB A1C): HEMOGLOBIN A1C: 6.7 % — AB (ref 4.0–5.6)

## 2017-10-25 MED ORDER — INSULIN ASPART 100 UNIT/ML FLEXPEN: 10.0000 [IU] | PEN_INJECTOR | Freq: Three times a day (TID) | SUBCUTANEOUS | 1 refills | Status: DC

## 2017-10-25 MED ORDER — INSULIN DETEMIR 100 UNIT/ML FLEXPEN: 25.0000 [IU] | PEN_INJECTOR | Freq: Every day | SUBCUTANEOUS | 11 refills | Status: DC

## 2017-10-25 NOTE — Patient Instructions (Addendum)
Your blood pressure is high today.  Please see your primary care provider soon, to have it rechecked.  check your blood sugar 10 times a day: before the 3 meals, and at bedtime.  also check if you have symptoms of your blood sugar being too high or too low.  please keep a record of the readings and bring it to your next appointment here (or you can bring the meter itself).  You can write it on any piece of paper.  please call us sooner if your blood sugar goes below 70, or if you have a lot of readings over 200.  Please change the levemir to the evening.  Please continue the same novolog.  Please come back for a follow-up appointment in 3 months.

## 2017-10-25 NOTE — Progress Notes (Signed)
Subjective:    Patient ID: Jesse Lane, male    DOB: Apr 21, 1955, 62 y.o.   MRN: [id number]  HPI Pt returns for f/u of diabetes mellitus:  DM type: 1 Dx'ed: 6237 Complications: polyneuropathy and DR Therapy: insulin since early 2018 DKA: most recently 3 times in 2018.  Severe hypoglycemia: last episode was mid-2018 Pancreatitis: never Pancreatic imaging: never Other: he takes multiple daily injections; he is retired; he declines pump rx. Interval history: we reviewed continuous glucose monitor data together.  Glucose varies from 90-280.  It is highest fasting.  He ran out of levemir last month.  He resumed at 25 units QAM.  He takes novolog 10 units 3 times a day (just before each meal).  He seldom has hypoglycemia, and says these episodes are mild.  He is finished melanoma rx, except for Keytruda.   Past Medical History:  Diagnosis Date  . Cancer ([place])   . Diabetes mellitus type 2, uncontrolled ([place])   . Diabetic neuropathy ([place])   . History of stomach ulcers   . Hypertension     Past Surgical History:  Procedure Laterality Date  . COLONOSCOPY    . INGUINAL HERNIA REPAIR N/A 08/15/2015   Procedure: HERNIA REPAIR INGUINAL INCARCERATED;  Surgeon: Alphonsa Overall, MD;  Location: [facility];  Service: General;  Laterality: N/A;  with MESH  . pyloric stenosis    . TONSILLECTOMY      Social History   Socioeconomic History  . Marital status: Single    Spouse name: Not on file  . Number of children: Not on file  . Years of education: Not on file  . Highest education level: Not on file  Occupational History  . Not on file  Social Needs  . Financial resource strain: Not on file  . Food insecurity:    Worry: Not on file    Inability: Not on file  . Transportation needs:    Medical: Not on file    Non-medical: Not on file  Tobacco Use  . Smoking status: Former Smoker    Packs/day: 1.00    Years: 5.00    Pack years: 5.00    Types: Cigarettes  . Smokeless tobacco: Never Used   Substance and Sexual Activity  . Alcohol use: No    Alcohol/week: 0.6 oz    Types: 1 Glasses of wine per week    Comment: daily  . Drug use: Yes    Types: Marijuana    Comment: daily  . Sexual activity: Never  Lifestyle  . Physical activity:    Days per week: Not on file    Minutes per session: Not on file  . Stress: Not on file  Relationships  . Social connections:    Talks on phone: Not on file    Gets together: Not on file    Attends religious service: Not on file    Active member of club or organization: Not on file    Attends meetings of clubs or organizations: Not on file    Relationship status: Not on file  . Intimate partner violence:    Fear of current or ex partner: Not on file    Emotionally abused: Not on file    Physically abused: Not on file    Forced sexual activity: Not on file  Other Topics Concern  . Not on file  Social History Narrative  . Not on file    Current Outpatient Medications on File Prior to Visit  Medication Sig  Dispense Refill  . acetaminophen (TYLENOL) 500 MG tablet Take 500 mg by mouth every 6 (six) hours as needed for mild pain or headache.    . Continuous Blood Gluc Receiver (FREESTYLE LIBRE 14 DAY READER) DEVI 1 Device by Does not apply route daily as needed. 1 Device 0  . Continuous Blood Gluc Sensor (FREESTYLE LIBRE 14 DAY SENSOR) MISC 1 Device by Does not apply route daily. 3 each 4  . feeding supplement, ENSURE ENLIVE, (ENSURE ENLIVE) LIQD Take 237 mLs by mouth 3 (three) times daily between meals. 237 mL 12  . glucose blood (FREESTYLE PRECISION NEO TEST) test strip Used to check blood sugars up to 3 times a day. 150 each 3  . Insulin Pen Needle (BD PEN NEEDLE MICRO U/F) 32G X 6 MM MISC USE 1 PEN NEEDLE SIX TIMES DAILY 200 each 2  . lisinopril (PRINIVIL,ZESTRIL) 20 MG tablet Take 20 mg by mouth daily with breakfast.   0  . LYRICA 100 MG capsule Take 200 mg by mouth at bedtime.   0  . Multiple Vitamin (MULTIVITAMIN WITH MINERALS) TABS  tablet Take 1 tablet by mouth daily with breakfast.    . omeprazole (PRILOSEC) 40 MG capsule Take 1 capsule (40 mg total) by mouth daily with breakfast. (Patient taking differently: Take 40 mg by mouth daily as needed. ) 30 capsule 0  . oxyCODONE-acetaminophen (PERCOCET/ROXICET) 5-325 MG tablet Take 1 tablet by mouth at bedtime.    . polyethylene glycol (MIRALAX / GLYCOLAX) packet Take 17 g by mouth 2 (two) times daily. (Patient taking differently: Take 17 g by mouth daily as needed for moderate constipation. ) 14 each 0  . traZODone (DESYREL) 100 MG tablet Take 100 mg by mouth at bedtime.  0  . Vitamin D, Ergocalciferol, (DRISDOL) 50000 units CAPS capsule Take 50,000 Units by mouth every 7 (seven) days.   0   No current facility-administered medications on file prior to visit.     No Known Allergies  Family History  Problem Relation Age of Onset  . Breast cancer Mother   . Bone cancer Mother   . Melanoma Mother   . Prostate cancer Father   . Diabetes Neg Hx     BP (!) 142/92 (BP Location: Left Arm, Patient Position: Sitting, Cuff Size: Normal)   Pulse 74   Ht 5\' 11"  (1.803 m)   Wt 158 lb 3.2 oz (71.8 kg)   SpO2 99%   BMI 22.06 kg/m    Review of Systems He denies hypoglycemia.      Objective:   Physical Exam VITAL SIGNS:  See vs page GENERAL: no distress Pulses: dorsalis pedis intact bilat.   MSK: no deformity of the feet CV: no leg edema Skin:  no ulcer on the feet.  normal color and temp on the feet. Neuro: sensation is intact to touch on the feet  A1c=6.7%    Assessment & Plan:  Type 1 DM, with DR: The pattern of his cbg's indicates he needs some adjustment in his therapy.  He declines pump rx.  Noncompliance with insulin dosing and f/u ov's.  We discussed.   HTN: is noted today  Patient Instructions  Your blood pressure is high today.  Please see your primary care provider soon, to have it rechecked.  check your blood sugar 10 times a day: before the 3 meals,  and at bedtime.  also check if you have symptoms of your blood sugar being too high or too low.  please  keep a record of the readings and bring it to your next appointment here (or you can bring the meter itself).  You can write it on any piece of paper.  please call us sooner if your blood sugar goes below 70, or if you have a lot of readings over 200.  Please change the levemir to the evening.  Please continue the same novolog.  Please come back for a follow-up appointment in 3 months.

## 2017-11-01 ENCOUNTER — Encounter (HOSPITAL_COMMUNITY): Payer: Self-pay | Admitting: *Deleted

## 2017-11-01 ENCOUNTER — Emergency Department (HOSPITAL_COMMUNITY)
Admission: EM | Admit: 2017-11-01 | Discharge: 2017-11-02 | Disposition: A | Discharge status: Home / Self Care | Payer: BLUE CROSS/BLUE SHIELD | Attending: Emergency Medicine | Admitting: Emergency Medicine

## 2017-11-01 ENCOUNTER — Other Ambulatory Visit: Payer: Self-pay

## 2017-11-01 ENCOUNTER — Emergency Department (HOSPITAL_COMMUNITY): Payer: BLUE CROSS/BLUE SHIELD

## 2017-11-01 DIAGNOSIS — E860 Dehydration: Secondary | ICD-10-CM | POA: Diagnosis not present

## 2017-11-01 DIAGNOSIS — I10 Essential (primary) hypertension: Secondary | ICD-10-CM | POA: Insufficient documentation

## 2017-11-01 DIAGNOSIS — C4362 Malignant melanoma of left upper limb, including shoulder: Secondary | ICD-10-CM | POA: Insufficient documentation

## 2017-11-01 DIAGNOSIS — Z794 Long term (current) use of insulin: Secondary | ICD-10-CM | POA: Diagnosis not present

## 2017-11-01 DIAGNOSIS — R1084 Generalized abdominal pain: Secondary | ICD-10-CM

## 2017-11-01 DIAGNOSIS — Z79899 Other long term (current) drug therapy: Secondary | ICD-10-CM | POA: Insufficient documentation

## 2017-11-01 DIAGNOSIS — Z87891 Personal history of nicotine dependence: Secondary | ICD-10-CM | POA: Diagnosis not present

## 2017-11-01 DIAGNOSIS — E1140 Type 2 diabetes mellitus with diabetic neuropathy, unspecified: Secondary | ICD-10-CM | POA: Diagnosis not present

## 2017-11-01 LAB — URINALYSIS, ROUTINE W REFLEX MICROSCOPIC
Bacteria, UA: NONE SEEN
Bilirubin Urine: NEGATIVE
Ketones, ur: 80 mg/dL — AB
Leukocytes, UA: NEGATIVE
NITRITE: NEGATIVE
PROTEIN: 30 mg/dL — AB
SPECIFIC GRAVITY, URINE: 1.032 — AB (ref 1.005–1.030)
pH: 6 (ref 5.0–8.0)

## 2017-11-01 LAB — CBC WITH DIFFERENTIAL/PLATELET
BASOS ABS: 0 10*3/uL (ref 0.0–0.1)
BASOS PCT: 0 %
Eosinophils Absolute: 0 10*3/uL (ref 0.0–0.7)
Eosinophils Relative: 0 %
HCT: 37 % — ABNORMAL LOW (ref 39.0–52.0)
Hemoglobin: 13 g/dL (ref 13.0–17.0)
LYMPHS PCT: 19 %
Lymphs Abs: 1.1 10*3/uL (ref 0.7–4.0)
MCH: 31.3 pg (ref 26.0–34.0)
MCHC: 35.1 g/dL (ref 30.0–36.0)
MCV: 88.9 fL (ref 78.0–100.0)
MONO ABS: 0.4 10*3/uL (ref 0.1–1.0)
Monocytes Relative: 7 %
Neutro Abs: 4.3 10*3/uL (ref 1.7–7.7)
Neutrophils Relative %: 74 %
Platelets: 178 10*3/uL (ref 150–400)
RBC: 4.16 MIL/uL — ABNORMAL LOW (ref 4.22–5.81)
RDW: 12.9 % (ref 11.5–15.5)
WBC: 5.8 10*3/uL (ref 4.0–10.5)

## 2017-11-01 LAB — COMPREHENSIVE METABOLIC PANEL
ALT: 14 U/L (ref 0–44)
ANION GAP: 19 — AB (ref 5–15)
AST: 13 U/L — AB (ref 15–41)
Albumin: 4.3 g/dL (ref 3.5–5.0)
Alkaline Phosphatase: 59 U/L (ref 38–126)
BUN: 19 mg/dL (ref 8–23)
CHLORIDE: 104 mmol/L (ref 98–111)
CO2: 16 mmol/L — ABNORMAL LOW (ref 22–32)
Calcium: 9.5 mg/dL (ref 8.9–10.3)
Creatinine, Ser: 1.1 mg/dL (ref 0.61–1.24)
GFR calc Af Amer: >60 mL/min (ref >60)
Glucose, Bld: 281 mg/dL — ABNORMAL HIGH (ref 70–99)
Potassium: 4.4 mmol/L (ref 3.5–5.1)
Sodium: 139 mmol/L (ref 135–145)
Total Bilirubin: 2.1 mg/dL — ABNORMAL HIGH (ref 0.3–1.2)
Total Protein: 7.1 g/dL (ref 6.5–8.1)

## 2017-11-01 LAB — LIPASE, BLOOD: Lipase: 21 U/L (ref 11–51)

## 2017-11-01 MED ORDER — IOPAMIDOL (ISOVUE-300) INJECTION 61%
100.0000 mL | Freq: Once | INTRAVENOUS | Status: AC | PRN
  Administered 2017-11-01: 100 mL via INTRAVENOUS

## 2017-11-01 MED ORDER — IOPAMIDOL (ISOVUE-300) INJECTION 61%
INTRAVENOUS | Status: AC
  Filled 2017-11-01: qty 100

## 2017-11-01 NOTE — Discharge Instructions (Signed)
Follow up with your Physician for recheck on Monday 

## 2017-11-01 NOTE — ED Notes (Signed)
Bed: HQ60 Expected date:  Expected time:  Means of arrival:  Comments: EMS-cancer patient

## 2017-11-01 NOTE — ED Provider Notes (Signed)
[facility] DEPT Provider Note   CSN: [id number] Arrival date & time: 11/01/17  1644     History   Chief Complaint Chief Complaint  Patient presents with  . Abdominal Pain    HPI Jesse Lane is a 62 y.o. male.  The history is provided by the patient. No language interpreter was used.  Abdominal Pain   This is a new problem. The current episode started more than 1 week ago. The problem occurs constantly. The pain is associated with an unknown factor. The pain is located in the generalized abdominal region. The pain is moderate. Nothing aggravates the symptoms. Nothing relieves the symptoms. Past workup does not include GI consult. His past medical history does not include PUD.  Pt has a history of melanoma. He is on Keytruda for cancer. Pt reports he has abdominal pain after each treatment.  Pt reports he has continued to pain since treatment.   Past Medical History:  Diagnosis Date  . Cancer ([place])   . Diabetes mellitus type 2, uncontrolled ([place])   . Diabetic neuropathy ([place])   . History of stomach ulcers   . Hypertension     Patient Active Problem List   Diagnosis Date Noted  . Multinodular goiter 12/27/2016  . Diabetes ([place]) 11/26/2016  . Protein-calorie malnutrition, severe 09/17/2016  . DKA (diabetic ketoacidoses) ([place]) 09/17/2016  . Diabetic ketoacidosis without coma associated with type 2 diabetes mellitus ([place])   . Fecal impaction in rectum ([place])   . Hyperglycemia 09/16/2016  . Hyperglycemic hyperosmolar nonketotic coma ([place]) 09/16/2016  . High anion gap metabolic acidosis 49/44/9675  . Lactic acidosis 09/16/2016  . Anemia of chronic disease 09/16/2016  . Diabetic neuropathy ([place]) 09/06/2016  . Essential hypertension 09/06/2016  . Malignant melanoma of axilla ([place]) 08/12/2016    Past Surgical History:  Procedure Laterality Date  . COLONOSCOPY    . INGUINAL HERNIA REPAIR N/A 08/15/2015   Procedure: HERNIA REPAIR INGUINAL  INCARCERATED;  Surgeon: Alphonsa Overall, MD;  Location: [facility];  Service: General;  Laterality: N/A;  with MESH  . pyloric stenosis    . TONSILLECTOMY          Home Medications    Prior to Admission medications   Medication Sig Start Date End Date Taking? Authorizing Provider  acetaminophen (TYLENOL) 500 MG tablet Take 1,000 mg by mouth 2 (two) times daily as needed for mild pain or headache (sleep).    Yes [provider]  amLODipine (NORVASC) 10 MG tablet Take 10 mg by mouth daily. 10/30/17  Yes [provider]  fluticasone (FLONASE) 50 MCG/ACT nasal spray Place 2 sprays into both nostrils daily.   Yes [provider]  insulin aspart (NOVOLOG FLEXPEN) 100 UNIT/ML FlexPen Inject 10 Units into the skin 3 (three) times daily with meals. And pen needles 4/day 10/25/17  Yes Renato Shin, MD  Insulin Detemir (LEVEMIR) 100 UNIT/ML Pen Inject 25 Units into the skin at bedtime. 10/25/17  Yes Renato Shin, MD  lisinopril (PRINIVIL,ZESTRIL) 40 MG tablet Take 40 mg by mouth daily with breakfast.  07/24/16  Yes [provider]  LYRICA 100 MG capsule Take 100 mg by mouth See admin instructions. 100 mg in the am, 100 mg at noon and 300 mg at bedtime 07/27/16  Yes [provider]  pantoprazole (PROTONIX) 40 MG tablet Take 40 mg by mouth daily. 10/22/17  Yes [provider]  prochlorperazine (COMPAZINE) 5 MG tablet Take 5 mg by mouth every 4 (four) hours  as needed for nausea or vomiting. for nausea 10/23/17  Yes [provider]  traZODone (DESYREL) 100 MG tablet Take 100 mg by mouth at bedtime. 07/24/16  Yes [provider]  Continuous Blood Gluc Receiver (FREESTYLE LIBRE 14 DAY READER) DEVI 1 Device by Does not apply route daily as needed. 08/27/17   Renato Shin, MD  Continuous Blood Gluc Sensor (FREESTYLE LIBRE 14 DAY SENSOR) MISC 1 Device by Does not apply route daily. 08/27/17   Renato Shin, MD  feeding supplement, ENSURE ENLIVE, (ENSURE  ENLIVE) LIQD Take 237 mLs by mouth 3 (three) times daily between meals. Patient not taking: Reported on 11/01/2017 09/17/16   [name], [name], MD  glucose blood (FREESTYLE PRECISION NEO TEST) test strip Used to check blood sugars up to 3 times a day. 09/27/17   Renato Shin, MD  Insulin Pen Needle (BD PEN NEEDLE MICRO U/F) 32G X 6 MM MISC USE 1 PEN NEEDLE SIX TIMES DAILY 08/29/17   Renato Shin, MD  omeprazole (PRILOSEC) 40 MG capsule Take 1 capsule (40 mg total) by mouth daily with breakfast. Patient taking differently: Take 40 mg by mouth daily as needed.  09/17/16 10/17/16  [name], [name], MD  polyethylene glycol [facility] / Floria Raveling) packet Take 17 g by mouth 2 (two) times daily. Patient not taking: Reported on 11/01/2017 09/17/16   [name], [name], MD    Family History Family History  Problem Relation Age of Onset  . Breast cancer Mother   . Bone cancer Mother   . Melanoma Mother   . Prostate cancer Father   . Diabetes Neg Hx     Social History Social History   Tobacco Use  . Smoking status: Former Smoker    Packs/day: 1.00    Years: 5.00    Pack years: 5.00    Types: Cigarettes  . Smokeless tobacco: Never Used  Substance Use Topics  . Alcohol use: No    Alcohol/week: 0.6 oz    Types: 1 Glasses of wine per week    Comment: daily  . Drug use: Yes    Types: Marijuana    Comment: daily     Allergies   Patient has no known allergies.   Review of Systems Review of Systems  Gastrointestinal: Positive for abdominal pain.  All other systems reviewed and are negative.    Physical Exam Updated Vital Signs BP (!) 182/85   Pulse 72   Resp 19   Ht 5\' 11"  (1.803 m)   Wt 71.2 kg (157 lb)   SpO2 98%   BMI 21.90 kg/m   Physical Exam  Constitutional: He appears well-developed and well-nourished.  HENT:  Head: Normocephalic and atraumatic.  Eyes: Conjunctivae are normal.  Neck: Neck supple.  Cardiovascular: Normal rate and regular rhythm.  No  murmur heard. Pulmonary/Chest: Effort normal and breath sounds normal. No respiratory distress.  Abdominal: Soft. Bowel sounds are normal. There is generalized tenderness.  Musculoskeletal: He exhibits no edema.  Neurological: He is alert.  Skin: Skin is warm and dry.  Psychiatric: He has a normal mood and affect.  Nursing note and vitals reviewed.    ED Treatments / Results  Labs (all labs ordered are listed, but only abnormal results are displayed) Labs Reviewed  CBC WITH DIFFERENTIAL/PLATELET - Abnormal; Notable for the following components:      Result Value   RBC 4.16 (*)    HCT 37.0 (*)    All other components within normal limits  COMPREHENSIVE METABOLIC PANEL -  Abnormal; Notable for the following components:   CO2 16 (*)    Glucose, Bld 281 (*)    AST 13 (*)    Total Bilirubin 2.1 (*)    Anion gap 19 (*)    All other components within normal limits  LIPASE, BLOOD  URINALYSIS, ROUTINE W REFLEX MICROSCOPIC    EKG None  Radiology Ct Abdomen Pelvis W Contrast  Result Date: 11/01/2017 CLINICAL DATA:  Acute abdominal pain, generalized. Undergoing treatment for melanoma EXAM: CT ABDOMEN AND PELVIS WITH CONTRAST TECHNIQUE: Multidetector CT imaging of the abdomen and pelvis was performed using the standard protocol following bolus administration of intravenous contrast. CONTRAST:  132mL ISOVUE-300 IOPAMIDOL (ISOVUE-300) INJECTION 61% COMPARISON:  07/12/2017 FINDINGS: Lower chest:  No contributory findings. Hepatobiliary: No focal liver abnormality.No evidence of biliary obstruction or stone. Pancreas: Generalized atrophy Spleen: Unremarkable. Adrenals/Urinary Tract: Negative adrenals. No hydronephrosis or stone. Unremarkable bladder. Stomach/Bowel: No obstruction. No appendicitis. Uncomplicated duodenal diverticulum. The left colon has a prominent wall thickness but this is attributed to decompressed state given lack of fat edema or mucosal hyperenhancement. Vascular/Lymphatic:  Extensive atherosclerotic calcification. No mass or adenopathy. Reproductive:Negative Other: No ascites or pneumoperitoneum. Musculoskeletal: Prominent osteopenia. IMPRESSION: No acute finding.  Stable from 07/12/2017 comparison Electronically Signed   By: Monte Fantasia M.D.   On: 11/01/2017 19:18    Procedures Procedures (including critical care time)  Medications Ordered in ED Medications  iopamidol (ISOVUE-300) 61 % injection 100 mL (100 mLs Intravenous Contrast Given 11/01/17 1853)     Initial Impression / Assessment and Plan / ED Course  I have reviewed the triage vital signs and the nursing notes.  Pertinent labs & imaging results that were available during my care of the patient were reviewed by me and considered in my medical decision making (see chart for details).     MDM  Ct scan reviewed. Labs reviewed.  Pt has slight elevation in bili.  Urine shows greater than 80 ketones.  Pt given Iv fluids.  Pt given oral fluids   Pt advised to follow up with his Oncologist.    Final Clinical Impressions(s) / ED Diagnoses   Final diagnoses:  Generalized abdominal pain  Dehydration    ED Discharge Orders    None    An After Visit Summary was printed and given to the patient.    Sidney Ace 11/01/17 2330    Virgel Manifold, MD 11/03/17 1459

## 2017-11-01 NOTE — ED Triage Notes (Signed)
Patient arrives with c/o generalized abdominal pain x2 weeks, cancer pt- melanoma, last treatment Tuesday,  patient with no appetite x2 days. +Nausea, - vomiting, denies blood in stool, urine.   BP: 152/60 P: 66 SPO2: 97% CBG: 253

## 2018-02-14 ENCOUNTER — Other Ambulatory Visit: Payer: Self-pay | Admitting: Endocrinology

## 2018-02-14 NOTE — Telephone Encounter (Signed)
Received refill request from [facility] for Novolog Flexpen. Rx refill request approved and sent to pt pharmacy.

## 2018-02-26 ENCOUNTER — Other Ambulatory Visit: Payer: Self-pay | Admitting: Endocrinology

## 2018-02-28 ENCOUNTER — Other Ambulatory Visit: Payer: Self-pay

## 2018-02-28 ENCOUNTER — Telehealth: Payer: Self-pay | Admitting: Endocrinology

## 2018-02-28 ENCOUNTER — Other Ambulatory Visit: Payer: Self-pay | Admitting: Endocrinology

## 2018-02-28 MED ORDER — FREESTYLE LIBRE 14 DAY SENSOR MISC: 1.0000 | Freq: Every day | 4 refills | Status: DC

## 2018-02-28 NOTE — Telephone Encounter (Signed)
Patient is calling back to check the status of his Free style [employer]. And stated the pharmacy has not received an approval from our office.   Please advise

## 2018-02-28 NOTE — Telephone Encounter (Signed)
Per [facility] caller states that he need Rx for free style libre sensor that is having issues at the pharmacy

## 2018-02-28 NOTE — Telephone Encounter (Signed)
Received notification from pt re: request to refill Sensors. Request authorized and refill sent as requested.

## 2018-02-28 NOTE — Telephone Encounter (Signed)
Patient called to check on Free Style Libre sensor.  I found that he was actually needing a refill not a new authorization.   I spoke with

## 2018-02-28 NOTE — Telephone Encounter (Signed)
This order was placed before I started with this practice. Unfortunately, I am unable to provide a status. I will need to follow up with Isabel Caprice and Charisse March.

## 2018-02-28 NOTE — Telephone Encounter (Signed)
Patient called back regarding Free Style Libre sensor refill.  I consulted with Tashia and she found that the Rx had been filled. I contacted pharm confirmed receipt and fill status.  Called patient back to advise.

## 2018-03-19 ENCOUNTER — Other Ambulatory Visit (HOSPITAL_COMMUNITY): Payer: Self-pay | Admitting: Hematology and Oncology

## 2018-03-19 DIAGNOSIS — R609 Edema, unspecified: Secondary | ICD-10-CM

## 2018-03-20 ENCOUNTER — Encounter (HOSPITAL_COMMUNITY): Payer: Self-pay

## 2018-03-21 ENCOUNTER — Ambulatory Visit (HOSPITAL_COMMUNITY)
Admission: RE | Admit: 2018-03-21 | Discharge: 2018-03-21 | Disposition: A | Discharge status: Home / Self Care | Payer: BLUE CROSS/BLUE SHIELD | Source: Ambulatory Visit | Attending: Hematology and Oncology | Admitting: Hematology and Oncology

## 2018-03-21 DIAGNOSIS — R609 Edema, unspecified: Secondary | ICD-10-CM

## 2018-03-21 NOTE — Progress Notes (Signed)
BLE venous duplex       has been completed. Preliminary results can be found under CV proc through chart review. Landry Mellow, RDMS, RVT  Called results to Dr. Baird Cancer office.

## 2018-05-13 ENCOUNTER — Other Ambulatory Visit: Payer: Self-pay | Admitting: Endocrinology

## 2018-06-19 ENCOUNTER — Other Ambulatory Visit: Payer: Self-pay | Admitting: Endocrinology

## 2018-06-19 ENCOUNTER — Other Ambulatory Visit: Payer: Self-pay

## 2018-06-19 MED ORDER — INSULIN ASPART 100 UNIT/ML FLEXPEN: PEN_INJECTOR | SUBCUTANEOUS | 1 refills | Status: DC

## 2018-06-20 ENCOUNTER — Telehealth: Payer: Self-pay | Admitting: Endocrinology

## 2018-06-20 DIAGNOSIS — E1042 Type 1 diabetes mellitus with diabetic polyneuropathy: Secondary | ICD-10-CM

## 2018-06-20 NOTE — Telephone Encounter (Signed)
Wasn't sure if I should send this to you or Lattie Haw. Please advise

## 2018-06-20 NOTE — Telephone Encounter (Signed)
Patient called re: Patient's new insurance is no longer in [facility] network and therefor will no longer be seen by Dr. Loanne Drilling unless he is able to change insurance next year ( he prefers to see Dr. Loanne Drilling because he is awesome). Patient is requesting referral to [facility] Fax# [phone].

## 2018-06-20 NOTE — Telephone Encounter (Signed)
Please advise 

## 2018-06-20 NOTE — Telephone Encounter (Signed)
Ok, I placed referral 

## 2018-06-23 ENCOUNTER — Ambulatory Visit: Payer: Self-pay | Admitting: Endocrinology

## 2018-06-23 NOTE — Telephone Encounter (Signed)
Pts info has been sent

## 2018-06-23 NOTE — Telephone Encounter (Signed)
I have faxed this referral and note placed in the work Q

## 2018-06-26 ENCOUNTER — Other Ambulatory Visit: Payer: Self-pay | Admitting: Endocrinology

## 2018-06-28 ENCOUNTER — Other Ambulatory Visit: Payer: Self-pay | Admitting: Endocrinology

## 2018-06-28 NOTE — Telephone Encounter (Signed)
Please refill x 1 Ov is due  

## 2018-06-30 ENCOUNTER — Other Ambulatory Visit: Payer: Self-pay

## 2018-06-30 MED ORDER — INSULIN PEN NEEDLE 32G X 6 MM MISC: 1.0000 | 0 refills | Status: AC

## 2018-06-30 MED ORDER — INSULIN PEN NEEDLE 32G X 6 MM MISC: 2 refills | Status: DC

## 2018-07-02 ENCOUNTER — Other Ambulatory Visit: Payer: Self-pay

## 2018-07-02 MED ORDER — INSULIN PEN NEEDLE 32G X 6 MM MISC: 2 refills | Status: AC

## 2018-11-11 DIAGNOSIS — E11311 Type 2 diabetes mellitus with unspecified diabetic retinopathy with macular edema: Secondary | ICD-10-CM | POA: Insufficient documentation

## 2019-06-25 ENCOUNTER — Ambulatory Visit: Payer: BLUE CROSS/BLUE SHIELD | Attending: Internal Medicine

## 2019-06-25 DIAGNOSIS — Z23 Encounter for immunization: Secondary | ICD-10-CM

## 2019-06-25 NOTE — Progress Notes (Signed)
   Covid-19 Vaccination Clinic  Name:  Jesse Lane    MRN: [id number] DOB: 08/20/1955  06/25/2019  [name] was observed post Covid-19 immunization for 15 minutes without incident. He was provided with Vaccine Information Sheet and instruction to access the V-Safe system.   [name] was instructed to call 911 with any severe reactions post vaccine: [place] Kitchen Difficulty breathing  . Swelling of face and throat  . A fast heartbeat  . A bad rash all over body  . Dizziness and weakness   Immunizations Administered    Name Date Dose VIS Date Route   Pfizer COVID-19 Vaccine 06/25/2019  2:37 PM 0.3 mL 03/20/2019 Intramuscular   Manufacturer: [place]   Lot: [id number]   [place]: [id number]

## 2019-07-20 ENCOUNTER — Ambulatory Visit: Payer: BLUE CROSS/BLUE SHIELD | Attending: Internal Medicine

## 2019-07-20 DIAGNOSIS — Z23 Encounter for immunization: Secondary | ICD-10-CM

## 2019-07-20 NOTE — Progress Notes (Signed)
   Covid-19 Vaccination Clinic  Name:  Jesse Lane    MRN: [id number] DOB: 10-02-1955  07/20/2019  Jesse Lane was observed post Covid-19 immunization for 15 minutes without incident. He was provided with Vaccine Information Sheet and instruction to access the V-Safe system.   Jesse Lane was instructed to call 911 with any severe reactions post vaccine: Marland Kitchen Difficulty breathing  . Swelling of face and throat  . A fast heartbeat  . A bad rash all over body  . Dizziness and weakness   Immunizations Administered    Name Date Dose VIS Date Route   Pfizer COVID-19 Vaccine 07/20/2019  3:06 PM 0.3 mL 03/20/2019 Intramuscular   Manufacturer: [place]   Lot: [id number]   [place]: [id number]

## 2019-10-12 ENCOUNTER — Emergency Department (HOSPITAL_COMMUNITY): Payer: Medicare Other

## 2019-10-12 ENCOUNTER — Other Ambulatory Visit: Payer: Self-pay

## 2019-10-12 ENCOUNTER — Emergency Department (HOSPITAL_COMMUNITY)
Admission: EM | Admit: 2019-10-12 | Discharge: 2019-10-12 | Disposition: A | Discharge status: Home / Self Care | Payer: Medicare Other | Attending: Emergency Medicine | Admitting: Emergency Medicine

## 2019-10-12 ENCOUNTER — Encounter (HOSPITAL_COMMUNITY): Payer: Self-pay

## 2019-10-12 DIAGNOSIS — Z794 Long term (current) use of insulin: Secondary | ICD-10-CM | POA: Diagnosis not present

## 2019-10-12 DIAGNOSIS — Z85820 Personal history of malignant melanoma of skin: Secondary | ICD-10-CM | POA: Insufficient documentation

## 2019-10-12 DIAGNOSIS — Z87891 Personal history of nicotine dependence: Secondary | ICD-10-CM | POA: Insufficient documentation

## 2019-10-12 DIAGNOSIS — E1140 Type 2 diabetes mellitus with diabetic neuropathy, unspecified: Secondary | ICD-10-CM | POA: Insufficient documentation

## 2019-10-12 DIAGNOSIS — F1210 Cannabis abuse, uncomplicated: Secondary | ICD-10-CM | POA: Insufficient documentation

## 2019-10-12 DIAGNOSIS — I10 Essential (primary) hypertension: Secondary | ICD-10-CM | POA: Diagnosis not present

## 2019-10-12 DIAGNOSIS — Z79899 Other long term (current) drug therapy: Secondary | ICD-10-CM | POA: Diagnosis not present

## 2019-10-12 DIAGNOSIS — R1013 Epigastric pain: Secondary | ICD-10-CM | POA: Insufficient documentation

## 2019-10-12 LAB — COMPREHENSIVE METABOLIC PANEL
ALT: 19 U/L (ref 0–44)
AST: 22 U/L (ref 15–41)
Albumin: 4.2 g/dL (ref 3.5–5.0)
Alkaline Phosphatase: 58 U/L (ref 38–126)
Anion gap: 9 (ref 5–15)
BUN: 21 mg/dL (ref 8–23)
CO2: 26 mmol/L (ref 22–32)
Calcium: 9.1 mg/dL (ref 8.9–10.3)
Chloride: 104 mmol/L (ref 98–111)
Creatinine, Ser: 1.25 mg/dL — ABNORMAL HIGH (ref 0.61–1.24)
GFR calc Af Amer: >60 mL/min (ref >60)
GFR calc non Af Amer: >60 mL/min (ref >60)
Glucose, Bld: 143 mg/dL — ABNORMAL HIGH (ref 70–99)
Potassium: 3.3 mmol/L — ABNORMAL LOW (ref 3.5–5.1)
Sodium: 139 mmol/L (ref 135–145)
Total Bilirubin: 2.3 mg/dL — ABNORMAL HIGH (ref 0.3–1.2)
Total Protein: 7.3 g/dL (ref 6.5–8.1)

## 2019-10-12 LAB — CBC
HCT: 39.2 % (ref 39.0–52.0)
Hemoglobin: 14 g/dL (ref 13.0–17.0)
MCH: 32.3 pg (ref 26.0–34.0)
MCHC: 35.7 g/dL (ref 30.0–36.0)
MCV: 90.3 fL (ref 80.0–100.0)
Platelets: 162 10*3/uL (ref 150–400)
RBC: 4.34 MIL/uL (ref 4.22–5.81)
RDW: 13.1 % (ref 11.5–15.5)
WBC: 5.8 10*3/uL (ref 4.0–10.5)
nRBC: 0 % (ref 0.0–0.2)

## 2019-10-12 LAB — URINALYSIS, ROUTINE W REFLEX MICROSCOPIC
Bacteria, UA: NONE SEEN
Bilirubin Urine: NEGATIVE
Glucose, UA: 50 mg/dL — AB
Ketones, ur: 20 mg/dL — AB
Leukocytes,Ua: NEGATIVE
Nitrite: NEGATIVE
Protein, ur: >=300 mg/dL — AB
Specific Gravity, Urine: 1.025 (ref 1.005–1.030)
pH: 6 (ref 5.0–8.0)

## 2019-10-12 LAB — LIPASE, BLOOD: Lipase: 17 U/L (ref 11–51)

## 2019-10-12 MED ORDER — FAMOTIDINE IN NACL 20-0.9 MG/50ML-% IV SOLN
20.0000 mg | Freq: Once | INTRAVENOUS | Status: AC
  Administered 2019-10-12: 20 mg via INTRAVENOUS
  Filled 2019-10-12: qty 50

## 2019-10-12 MED ORDER — SODIUM CHLORIDE 0.9% FLUSH
3.0000 mL | Freq: Once | INTRAVENOUS | Status: AC
  Administered 2019-10-12: 3 mL via INTRAVENOUS

## 2019-10-12 MED ORDER — PROMETHAZINE HCL 25 MG/ML IJ SOLN
12.5000 mg | Freq: Once | INTRAMUSCULAR | Status: AC
  Administered 2019-10-12: 12.5 mg via INTRAVENOUS
  Filled 2019-10-12: qty 1

## 2019-10-12 MED ORDER — PANTOPRAZOLE SODIUM 20 MG PO TBEC
20.0000 mg | DELAYED_RELEASE_TABLET | Freq: Two times a day (BID) | ORAL | 0 refills | Status: DC
Start: 2019-10-12 — End: 2019-12-09

## 2019-10-12 MED ORDER — LACTATED RINGERS IV BOLUS
1000.0000 mL | Freq: Once | INTRAVENOUS | Status: AC
  Administered 2019-10-12: 1000 mL via INTRAVENOUS

## 2019-10-12 MED ORDER — ALUM & MAG HYDROXIDE-SIMETH 200-200-20 MG/5ML PO SUSP
30.0000 mL | Freq: Once | ORAL | Status: AC
  Administered 2019-10-12: 30 mL via ORAL
  Filled 2019-10-12: qty 30

## 2019-10-12 MED ORDER — LIDOCAINE VISCOUS HCL 2 % MT SOLN
15.0000 mL | Freq: Once | OROMUCOSAL | Status: AC
  Administered 2019-10-12: 15 mL via ORAL
  Filled 2019-10-12: qty 15

## 2019-10-12 MED ORDER — ONDANSETRON HCL 4 MG/2ML IJ SOLN
4.0000 mg | Freq: Once | INTRAMUSCULAR | Status: AC | PRN
  Administered 2019-10-12: 4 mg via INTRAVENOUS
  Filled 2019-10-12: qty 2

## 2019-10-12 NOTE — ED Provider Notes (Signed)
[name] DEPT Provider Note   CSN: [id number] Arrival date & time: 10/12/19  5916     History Chief Complaint  Patient presents with  . Abdominal Pain    Jesse Lane is a 64 y.o. male.  HPI     64 year old male with upper abdominal pain.  Onset about a week ago.  Describes epigastric discomfort.  Associated nausea.  No vomiting.  He has little appetite and says that he just does not feel like eating.  When he does though he does not notice any significant change in the symptoms.  He has not taken his home medications last couple days because of this.  No fevers or chills.  Feels very fatigued so he has no energy.  TSH 1 month ago was 1.96.  Past Medical History:  Diagnosis Date  . Cancer ([place])   . Diabetes mellitus type 2, uncontrolled ([place])   . Diabetic neuropathy ([place])   . History of stomach ulcers   . Hypertension     Patient Active Problem List   Diagnosis Date Noted  . Multinodular goiter 12/27/2016  . Diabetes ([place]) 11/26/2016  . Protein-calorie malnutrition, severe 09/17/2016  . DKA (diabetic ketoacidoses) ([place]) 09/17/2016  . Diabetic ketoacidosis without coma associated with type 2 diabetes mellitus ([place])   . Fecal impaction in rectum ([place])   . Hyperglycemia 09/16/2016  . Hyperglycemic hyperosmolar nonketotic coma ([place]) 09/16/2016  . High anion gap metabolic acidosis 38/46/6599  . Lactic acidosis 09/16/2016  . Anemia of chronic disease 09/16/2016  . Diabetic neuropathy ([place]) 09/06/2016  . Essential hypertension 09/06/2016  . Malignant melanoma of axilla ([place]) 08/12/2016    Past Surgical History:  Procedure Laterality Date  . COLONOSCOPY    . INGUINAL HERNIA REPAIR N/A 08/15/2015   Procedure: HERNIA REPAIR INGUINAL INCARCERATED;  Surgeon: Alphonsa Overall, MD;  Location: [facility];  Service: General;  Laterality: N/A;  with MESH  . pyloric stenosis    . TONSILLECTOMY         Family History  Problem Relation Age of Onset  .  Breast cancer Mother   . Bone cancer Mother   . Melanoma Mother   . Prostate cancer Father   . Diabetes Neg Hx     Social History   Tobacco Use  . Smoking status: Former Smoker    Packs/day: 1.00    Years: 5.00    Pack years: 5.00    Types: Cigarettes  . Smokeless tobacco: Never Used  Vaping Use  . Vaping Use: Never used  Substance Use Topics  . Alcohol use: Yes    Alcohol/week: 1.0 standard drink    Types: 1 Glasses of wine per week    Comment: daily  . Drug use: Yes    Types: Marijuana    Comment: daily    Home Medications Prior to Admission medications   Medication Sig Start Date End Date Taking? Authorizing Provider  acetaminophen (TYLENOL) 500 MG tablet Take 1,000 mg by mouth 2 (two) times daily as needed for mild pain or headache (sleep).     [provider]  amLODipine (NORVASC) 10 MG tablet Take 10 mg by mouth daily. 10/30/17   [provider]  Continuous Blood Gluc Receiver (FREESTYLE LIBRE 14 DAY READER) DEVI 1 Device by Does not apply route daily as needed. 08/27/17   Renato Shin, MD  Continuous Blood Gluc Sensor (FREESTYLE LIBRE 14 DAY SENSOR) MISC 1 Device by Does not apply route daily. 02/28/18   Loanne Drilling,  Hilliard Clark, MD  feeding supplement, ENSURE ENLIVE, (ENSURE ENLIVE) LIQD Take 237 mLs by mouth 3 (three) times daily between meals. Patient not taking: Reported on 11/01/2017 09/17/16   [name], [name], MD  fluticasone [facility]) 50 MCG/ACT nasal spray Place 2 sprays into both nostrils daily.    [provider]  glucose blood (FREESTYLE PRECISION NEO TEST) test strip Used to check blood sugars up to 3 times a day. 09/27/17   Renato Shin, MD  insulin aspart (NOVOLOG FLEXPEN) 100 UNIT/ML FlexPen INJECT 10 UNITS INTO THE SKIN 3 TIMES DAILY WITH MEALS 06/19/18   Renato Shin, MD  Insulin Detemir (LEVEMIR) 100 UNIT/ML Pen Inject 25 Units into the skin at bedtime. 10/25/17   Renato Shin, MD  Insulin Pen Needle (BD PEN NEEDLE MICRO U/F) 32G  X 6 MM MISC 1 each by Other route See admin instructions. Use one pen needle to inject insulin 4 times daily. **must have appointment for additional refills.** 06/30/18   Renato Shin, MD  Insulin Pen Needle (BD PEN NEEDLE MICRO U/F) 32G X 6 MM MISC USE 1 PEN NEEDLE SIX TIMES DAILY 07/02/18   Renato Shin, MD  lisinopril (PRINIVIL,ZESTRIL) 40 MG tablet Take 40 mg by mouth daily with breakfast.  07/24/16   [provider]  LYRICA 100 MG capsule Take 100 mg by mouth See admin instructions. 100 mg in the am, 100 mg at noon and 300 mg at bedtime 07/27/16   [provider]  omeprazole (PRILOSEC) 40 MG capsule Take 1 capsule (40 mg total) by mouth daily with breakfast. Patient taking differently: Take 40 mg by mouth daily as needed.  09/17/16 10/17/16  [name], [name], MD  pantoprazole (PROTONIX) 40 MG tablet Take 40 mg by mouth daily. 10/22/17   [provider]  polyethylene glycol (MIRALAX / GLYCOLAX) packet Take 17 g by mouth 2 (two) times daily. Patient not taking: Reported on 11/01/2017 09/17/16   [name], [name], MD  prochlorperazine (COMPAZINE) 5 MG tablet Take 5 mg by mouth every 4 (four) hours as needed for nausea or vomiting. for nausea 10/23/17   [provider]  traZODone (DESYREL) 100 MG tablet Take 100 mg by mouth at bedtime. 07/24/16   [provider]    Allergies    Contrast media [iodinated diagnostic agents]  Review of Systems   Review of Systems All systems reviewed and negative, other than as noted in HPI.  Physical Exam Updated Vital Signs Ht 5\' 11"  (1.803 m)   Wt 88.5 kg   BMI 27.20 kg/m   Physical Exam Vitals and nursing note reviewed.  Constitutional:      General: He is not in acute distress.    Appearance: He is well-developed.  HENT:     Head: Normocephalic and atraumatic.  Eyes:     General:        Right eye: No discharge.        Left eye: No discharge.     Conjunctiva/sclera: Conjunctivae normal.    Cardiovascular:     Rate and Rhythm: Normal rate and regular rhythm.     Heart sounds: Normal heart sounds. No murmur heard.  No friction rub. No gallop.   Pulmonary:     Effort: Pulmonary effort is normal. No respiratory distress.     Breath sounds: Normal breath sounds.  Abdominal:     General: There is no distension.     Palpations: Abdomen is soft.     Tenderness: There is abdominal tenderness.  Comments: Epigastric tenderness without rebound or guarding.  No distention.  Musculoskeletal:        General: No tenderness.     Cervical back: Neck supple.  Skin:    General: Skin is warm and dry.  Neurological:     Mental Status: He is alert.  Psychiatric:        Behavior: Behavior normal.        Thought Content: Thought content normal.     ED Results / Procedures / Treatments   Labs (all labs ordered are listed, but only abnormal results are displayed) Labs Reviewed - No data to display  EKG None  Radiology CT ABDOMEN PELVIS WO CONTRAST  Result Date: 10/12/2019 CLINICAL DATA:  Acute abdomen pain for 1 week. EXAM: CT ABDOMEN AND PELVIS WITHOUT CONTRAST TECHNIQUE: Multidetector CT imaging of the abdomen and pelvis was performed following the standard protocol without IV contrast. COMPARISON:  Aug 27, 2019 FINDINGS: Lower chest: Dependent atelectasis of bilateral posterior lung bases are identified. The heart size is mildly enlarged. Hepatobiliary: No focal liver abnormality is seen. No gallstones, gallbladder wall thickening, or biliary dilatation. Pancreas: Unremarkable. No pancreatic ductal dilatation or surrounding inflammatory changes. Spleen: Normal in size without focal abnormality. Adrenals/Urinary Tract: Adrenal glands are unremarkable. Kidneys are normal, without renal calculi, focal lesion, or hydronephrosis. Bladder is unremarkable. Stomach/Bowel: Stomach is within normal limits. Appendix appears normal. No evidence of bowel wall thickening, distention, or inflammatory  changes. Vascular/Lymphatic: Aortic atherosclerosis. No enlarged abdominal or pelvic lymph nodes. Reproductive: Prostate is unremarkable. Other: None. Musculoskeletal: Degenerative joint changes of the spine are identified. IMPRESSION: 1. No acute abnormality identified in the abdomen and pelvis. 2. Aortic atherosclerosis. Aortic Atherosclerosis (ICD10-I70.0). Electronically Signed   By: Abelardo Diesel M.D.   On: 10/12/2019 12:48    Procedures Procedures (including critical care time)  Medications Ordered in ED Medications - No data to display  ED Course  I have reviewed the triage vital signs and the nursing notes.  Pertinent labs & imaging results that were available during my care of the patient were reviewed by me and considered in my medical decision making (see chart for details).    MDM Rules/Calculators/A&P                          64 year old male with epigastric discomfort and anorexia.  I suspect this may be PUD.  Plan for PPI twice daily.  GI follow-up.  Low suspicion for emergent process.  Very hypertensive.  Patient is aware of this.  Needs to take his meds as prescribed.  Outpatient follow-up for this otherwise.  Final Clinical Impression(s) / ED Diagnoses Final diagnoses:  Epigastric pain    Rx / DC Orders ED Discharge Orders    None       Virgel Manifold, MD 10/13/19 [phone]

## 2019-10-12 NOTE — ED Triage Notes (Signed)
Per EMS pt complaint of abdominal pain with nausea for a week; poor appetite and has not taken home medication.   BP 190/120 HR 70 RR 20 O2 99% CBG 190 Temp 39F

## 2019-10-12 NOTE — ED Notes (Signed)
Urinal at bedside.  

## 2019-10-14 ENCOUNTER — Encounter: Payer: Self-pay | Admitting: Gastroenterology

## 2019-10-16 ENCOUNTER — Inpatient Hospital Stay (HOSPITAL_COMMUNITY)
Admission: EM | Admit: 2019-10-16 | Discharge: 2019-10-18 | DRG: 683 | Disposition: A | Discharge status: Home / Self Care | Payer: Medicare Other | Attending: Neurology | Admitting: Neurology

## 2019-10-16 ENCOUNTER — Emergency Department (HOSPITAL_COMMUNITY): Payer: Medicare Other

## 2019-10-16 ENCOUNTER — Encounter (HOSPITAL_COMMUNITY): Payer: Self-pay

## 2019-10-16 ENCOUNTER — Inpatient Hospital Stay (HOSPITAL_COMMUNITY): Payer: Medicare Other

## 2019-10-16 DIAGNOSIS — G8324 Monoplegia of upper limb affecting left nondominant side: Secondary | ICD-10-CM | POA: Diagnosis present

## 2019-10-16 DIAGNOSIS — R4781 Slurred speech: Secondary | ICD-10-CM | POA: Diagnosis present

## 2019-10-16 DIAGNOSIS — Z794 Long term (current) use of insulin: Secondary | ICD-10-CM

## 2019-10-16 DIAGNOSIS — I639 Cerebral infarction, unspecified: Secondary | ICD-10-CM

## 2019-10-16 DIAGNOSIS — E876 Hypokalemia: Secondary | ICD-10-CM | POA: Diagnosis present

## 2019-10-16 DIAGNOSIS — I674 Hypertensive encephalopathy: Secondary | ICD-10-CM | POA: Diagnosis not present

## 2019-10-16 DIAGNOSIS — R9401 Abnormal electroencephalogram [EEG]: Secondary | ICD-10-CM | POA: Diagnosis present

## 2019-10-16 DIAGNOSIS — R4701 Aphasia: Secondary | ICD-10-CM | POA: Diagnosis present

## 2019-10-16 DIAGNOSIS — Z91041 Radiographic dye allergy status: Secondary | ICD-10-CM

## 2019-10-16 DIAGNOSIS — I10 Essential (primary) hypertension: Secondary | ICD-10-CM | POA: Diagnosis present

## 2019-10-16 DIAGNOSIS — E7801 Familial hypercholesterolemia: Secondary | ICD-10-CM | POA: Diagnosis not present

## 2019-10-16 DIAGNOSIS — R29810 Facial weakness: Secondary | ICD-10-CM | POA: Diagnosis present

## 2019-10-16 DIAGNOSIS — R739 Hyperglycemia, unspecified: Secondary | ICD-10-CM | POA: Diagnosis not present

## 2019-10-16 DIAGNOSIS — Z8711 Personal history of peptic ulcer disease: Secondary | ICD-10-CM | POA: Diagnosis not present

## 2019-10-16 DIAGNOSIS — E663 Overweight: Secondary | ICD-10-CM | POA: Diagnosis present

## 2019-10-16 DIAGNOSIS — R2689 Other abnormalities of gait and mobility: Secondary | ICD-10-CM | POA: Diagnosis present

## 2019-10-16 DIAGNOSIS — Z20822 Contact with and (suspected) exposure to covid-19: Secondary | ICD-10-CM | POA: Diagnosis present

## 2019-10-16 DIAGNOSIS — Z87891 Personal history of nicotine dependence: Secondary | ICD-10-CM | POA: Diagnosis not present

## 2019-10-16 DIAGNOSIS — F17201 Nicotine dependence, unspecified, in remission: Secondary | ICD-10-CM

## 2019-10-16 DIAGNOSIS — E1159 Type 2 diabetes mellitus with other circulatory complications: Secondary | ICD-10-CM | POA: Diagnosis not present

## 2019-10-16 DIAGNOSIS — R410 Disorientation, unspecified: Secondary | ICD-10-CM | POA: Diagnosis present

## 2019-10-16 DIAGNOSIS — F1210 Cannabis abuse, uncomplicated: Secondary | ICD-10-CM | POA: Diagnosis present

## 2019-10-16 DIAGNOSIS — Z6827 Body mass index (BMI) 27.0-27.9, adult: Secondary | ICD-10-CM

## 2019-10-16 DIAGNOSIS — E785 Hyperlipidemia, unspecified: Secondary | ICD-10-CM | POA: Diagnosis present

## 2019-10-16 DIAGNOSIS — Z79899 Other long term (current) drug therapy: Secondary | ICD-10-CM

## 2019-10-16 DIAGNOSIS — I161 Hypertensive emergency: Secondary | ICD-10-CM | POA: Diagnosis present

## 2019-10-16 DIAGNOSIS — E1165 Type 2 diabetes mellitus with hyperglycemia: Secondary | ICD-10-CM | POA: Diagnosis present

## 2019-10-16 DIAGNOSIS — G9349 Other encephalopathy: Secondary | ICD-10-CM | POA: Diagnosis present

## 2019-10-16 DIAGNOSIS — E1140 Type 2 diabetes mellitus with diabetic neuropathy, unspecified: Secondary | ICD-10-CM | POA: Diagnosis present

## 2019-10-16 DIAGNOSIS — G9341 Metabolic encephalopathy: Secondary | ICD-10-CM | POA: Diagnosis not present

## 2019-10-16 DIAGNOSIS — I63412 Cerebral infarction due to embolism of left middle cerebral artery: Secondary | ICD-10-CM

## 2019-10-16 DIAGNOSIS — I6389 Other cerebral infarction: Secondary | ICD-10-CM | POA: Diagnosis not present

## 2019-10-16 DIAGNOSIS — N179 Acute kidney failure, unspecified: Principal | ICD-10-CM | POA: Diagnosis present

## 2019-10-16 LAB — CBC
HCT: 38.9 % — ABNORMAL LOW (ref 39.0–52.0)
Hemoglobin: 13.6 g/dL (ref 13.0–17.0)
MCH: 31.7 pg (ref 26.0–34.0)
MCHC: 35 g/dL (ref 30.0–36.0)
MCV: 90.7 fL (ref 80.0–100.0)
Platelets: 208 10*3/uL (ref 150–400)
RBC: 4.29 MIL/uL (ref 4.22–5.81)
RDW: 13 % (ref 11.5–15.5)
WBC: 8.5 10*3/uL (ref 4.0–10.5)
nRBC: 0 % (ref 0.0–0.2)

## 2019-10-16 LAB — PROTIME-INR
INR: 1 (ref 0.8–1.2)
Prothrombin Time: 12.6 seconds (ref 11.4–15.2)

## 2019-10-16 LAB — ETHANOL: Alcohol, Ethyl (B): <10 mg/dL (ref <10)

## 2019-10-16 LAB — DIFFERENTIAL
Abs Immature Granulocytes: 0.03 10*3/uL (ref 0.00–0.07)
Basophils Absolute: 0 10*3/uL (ref 0.0–0.1)
Basophils Relative: 0 %
Eosinophils Absolute: 0 10*3/uL (ref 0.0–0.5)
Eosinophils Relative: 0 %
Immature Granulocytes: 0 %
Lymphocytes Relative: 20 %
Lymphs Abs: 1.7 10*3/uL (ref 0.7–4.0)
Monocytes Absolute: 0.6 10*3/uL (ref 0.1–1.0)
Monocytes Relative: 7 %
Neutro Abs: 6.2 10*3/uL (ref 1.7–7.7)
Neutrophils Relative %: 73 %

## 2019-10-16 LAB — RAPID URINE DRUG SCREEN, HOSP PERFORMED
Amphetamines: NOT DETECTED
Barbiturates: NOT DETECTED
Benzodiazepines: NOT DETECTED
Cocaine: NOT DETECTED
Opiates: NOT DETECTED
Tetrahydrocannabinol: POSITIVE — AB

## 2019-10-16 LAB — COMPREHENSIVE METABOLIC PANEL
ALT: 18 U/L (ref 0–44)
AST: 25 U/L (ref 15–41)
Albumin: 4 g/dL (ref 3.5–5.0)
Alkaline Phosphatase: 51 U/L (ref 38–126)
Anion gap: 15 (ref 5–15)
BUN: 29 mg/dL — ABNORMAL HIGH (ref 8–23)
CO2: 19 mmol/L — ABNORMAL LOW (ref 22–32)
Calcium: 9.1 mg/dL (ref 8.9–10.3)
Chloride: 103 mmol/L (ref 98–111)
Creatinine, Ser: 2.22 mg/dL — ABNORMAL HIGH (ref 0.61–1.24)
GFR calc Af Amer: 35 mL/min — ABNORMAL LOW (ref >60)
GFR calc non Af Amer: 30 mL/min — ABNORMAL LOW (ref >60)
Glucose, Bld: 110 mg/dL — ABNORMAL HIGH (ref 70–99)
Potassium: 3.3 mmol/L — ABNORMAL LOW (ref 3.5–5.1)
Sodium: 137 mmol/L (ref 135–145)
Total Bilirubin: 1.9 mg/dL — ABNORMAL HIGH (ref 0.3–1.2)
Total Protein: 6.9 g/dL (ref 6.5–8.1)

## 2019-10-16 LAB — URINALYSIS, ROUTINE W REFLEX MICROSCOPIC
Bilirubin Urine: NEGATIVE
Glucose, UA: NEGATIVE mg/dL
Ketones, ur: 5 mg/dL — AB
Leukocytes,Ua: NEGATIVE
Nitrite: NEGATIVE
Protein, ur: 100 mg/dL — AB
Specific Gravity, Urine: 1.019 (ref 1.005–1.030)
pH: 5 (ref 5.0–8.0)

## 2019-10-16 LAB — GLUCOSE, CAPILLARY
Glucose-Capillary: 231 mg/dL — ABNORMAL HIGH (ref 70–99)
Glucose-Capillary: 311 mg/dL — ABNORMAL HIGH (ref 70–99)

## 2019-10-16 LAB — I-STAT CHEM 8, ED
BUN: 28 mg/dL — ABNORMAL HIGH (ref 8–23)
Calcium, Ion: 0.99 mmol/L — ABNORMAL LOW (ref 1.15–1.40)
Chloride: 102 mmol/L (ref 98–111)
Creatinine, Ser: 2.2 mg/dL — ABNORMAL HIGH (ref 0.61–1.24)
Glucose, Bld: 105 mg/dL — ABNORMAL HIGH (ref 70–99)
HCT: 38 % — ABNORMAL LOW (ref 39.0–52.0)
Hemoglobin: 12.9 g/dL — ABNORMAL LOW (ref 13.0–17.0)
Potassium: 3.3 mmol/L — ABNORMAL LOW (ref 3.5–5.1)
Sodium: 139 mmol/L (ref 135–145)
TCO2: 20 mmol/L — ABNORMAL LOW (ref 22–32)

## 2019-10-16 LAB — MRSA PCR SCREENING: MRSA by PCR: NEGATIVE

## 2019-10-16 LAB — CBG MONITORING, ED: Glucose-Capillary: 103 mg/dL — ABNORMAL HIGH (ref 70–99)

## 2019-10-16 LAB — HIV ANTIBODY (ROUTINE TESTING W REFLEX): HIV Screen 4th Generation wRfx: NONREACTIVE

## 2019-10-16 LAB — HEMOGLOBIN A1C
Hgb A1c MFr Bld: 6.5 % — ABNORMAL HIGH (ref 4.8–5.6)
Mean Plasma Glucose: 139.85 mg/dL

## 2019-10-16 LAB — APTT: aPTT: 31 seconds (ref 24–36)

## 2019-10-16 LAB — SARS CORONAVIRUS 2 BY RT PCR (HOSPITAL ORDER, PERFORMED IN ~~LOC~~ HOSPITAL LAB): SARS Coronavirus 2: NEGATIVE

## 2019-10-16 MED ORDER — LABETALOL HCL 5 MG/ML IV SOLN
20.0000 mg | Freq: Once | INTRAVENOUS | Status: AC
  Administered 2019-10-16: 20 mg via INTRAVENOUS

## 2019-10-16 MED ORDER — LABETALOL HCL 5 MG/ML IV SOLN
20.0000 mg | INTRAVENOUS | Status: DC | PRN
  Administered 2019-10-18: 20 mg via INTRAVENOUS
  Filled 2019-10-16: qty 4

## 2019-10-16 MED ORDER — INSULIN ASPART 100 UNIT/ML ~~LOC~~ SOLN: 0.0000 [IU] | Freq: Three times a day (TID) | SUBCUTANEOUS | Status: DC

## 2019-10-16 MED ORDER — STROKE: EARLY STAGES OF RECOVERY BOOK: Freq: Once | Status: DC

## 2019-10-16 MED ORDER — CLEVIDIPINE BUTYRATE 0.5 MG/ML IV EMUL: 0.0000 mg/h | INTRAVENOUS | Status: DC

## 2019-10-16 MED ORDER — IOHEXOL 350 MG/ML SOLN
75.0000 mL | Freq: Once | INTRAVENOUS | Status: AC | PRN
  Administered 2019-10-16: 75 mL via INTRAVENOUS

## 2019-10-16 MED ORDER — INSULIN ASPART 100 UNIT/ML ~~LOC~~ SOLN
0.0000 [IU] | Freq: Three times a day (TID) | SUBCUTANEOUS | Status: DC
  Administered 2019-10-16: 3 [IU] via SUBCUTANEOUS

## 2019-10-16 MED ORDER — SENNOSIDES-DOCUSATE SODIUM 8.6-50 MG PO TABS: 1.0000 | ORAL_TABLET | Freq: Every evening | ORAL | Status: DC | PRN

## 2019-10-16 MED ORDER — SODIUM CHLORIDE 0.9 % IV SOLN: INTRAVENOUS | Status: DC

## 2019-10-16 MED ORDER — ACETAMINOPHEN 650 MG RE SUPP: 650.0000 mg | RECTAL | Status: DC | PRN

## 2019-10-16 MED ORDER — ACETAMINOPHEN 325 MG PO TABS: 650.0000 mg | ORAL_TABLET | ORAL | Status: DC | PRN

## 2019-10-16 MED ORDER — SODIUM CHLORIDE 0.9 % IV SOLN
50.0000 mL | Freq: Once | INTRAVENOUS | Status: AC
  Administered 2019-10-16: 50 mL via INTRAVENOUS

## 2019-10-16 MED ORDER — DIPHENHYDRAMINE HCL 50 MG/ML IJ SOLN
INTRAMUSCULAR | Status: AC
  Administered 2019-10-16: 50 mg
  Filled 2019-10-16: qty 1

## 2019-10-16 MED ORDER — LABETALOL HCL 5 MG/ML IV SOLN
INTRAVENOUS | Status: AC
  Filled 2019-10-16: qty 4

## 2019-10-16 MED ORDER — ALTEPLASE (STROKE) FULL DOSE INFUSION
0.9000 mg/kg | Freq: Once | INTRAVENOUS | Status: AC
  Administered 2019-10-16: 79.1 mg via INTRAVENOUS
  Filled 2019-10-16: qty 100

## 2019-10-16 MED ORDER — PANTOPRAZOLE SODIUM 40 MG IV SOLR
40.0000 mg | Freq: Every day | INTRAVENOUS | Status: DC
  Administered 2019-10-16: 40 mg via INTRAVENOUS
  Filled 2019-10-16: qty 40

## 2019-10-16 MED ORDER — ACETAMINOPHEN 160 MG/5ML PO SOLN: 650.0000 mg | ORAL | Status: DC | PRN

## 2019-10-16 NOTE — Progress Notes (Signed)
Pharmacist Code Stroke Response  Notified to mix tPA at 1348 by Dr. Leonie Man Delivered tPA to RN at 1352  tPA dose = 7.9mg  bolus over 1 minute followed by 71.2mg  for a total dose of 79.1mg  over 1 hour  Issues/delays encountered (if applicable): Needed to obtain blood pressure and get a second line  Jesse Lane, Jesse Lane 10/16/19 2:00 PM

## 2019-10-16 NOTE — H&P (Addendum)
NEURO HOSPITALIST  H&P    Chief Complaint: aphasia, confusion   History obtained from:  EMS HPI:                                                                                                                                         Jesse Lane is an 64 y.o. male  With PMH HTN, DM2, cancer who presented to [facility] ED as a code stroke for confusion, slurred speech and gait instability. History obtained from his girlfriend whom I spoke to over the phone and EMS brought the patient. Patient was in the grocery store with his girlfriend at 24 and was normal. While at the store he suddenly became confused, was staring off and had trouble expressing himself.   ED course:  CTH: no hemorrhage  BP: 146/76 BG:103   Date last known well:10/16/19 Time last known well: 1145 tPA Given: yes, Modified Rankin: Rankin Score=0  NIHSS:12 Code stroke CT scan of the head showed no acute abnormality with normal aspect score.  CT angiogram was considered but initially not done due to patient serum creatinine being 2.2.  Patient was seen in [place], ER 2 days ago for abdominal pain and had undergone: CT scan of the abdomen and pelvis and received contrast.  Obtaining a stat MRI and MRA from work considered but patient was quite restless and would likely not be cooperative for the MRI.  Case was discussed with Dr.Katucia de Norma Fredrickson neuro interventionalists and the risk of missing an LVO without a CTA emergently was felt to overweigh the risk of worsening his renal function with the dye and was it was decided to do CT angiogram and hydrate the patient with IV normal saline 125 cc an hour and give Benadryl 50 mg for history of hives with IV dye.  CT angiogram of the brain and neck was obtained which was negative for LVO.    Past Medical History:  Diagnosis Date  . Cancer ([place])   . Diabetes mellitus type 2, uncontrolled ([place])   . Diabetic neuropathy ([place])   .  History of stomach ulcers   . Hypertension     Past Surgical History:  Procedure Laterality Date  . COLONOSCOPY    . INGUINAL HERNIA REPAIR N/A 08/15/2015   Procedure: HERNIA REPAIR INGUINAL INCARCERATED;  Surgeon: Alphonsa Overall, MD;  Location: [facility] ORS;  Service: General;  Laterality: N/A;  with MESH  . pyloric stenosis    . TONSILLECTOMY      Family History  Problem Relation Age of Onset  . Breast cancer Mother   . Bone cancer Mother   . Melanoma  Mother   . Prostate cancer Father   . Diabetes Neg Hx       Social History:  reports that he has quit smoking. His smoking use included cigarettes. He has a 5.00 pack-year smoking history. He has never used smokeless tobacco. He reports current alcohol use of about 1.0 standard drink of alcohol per week. He reports current drug use. Drug: Marijuana.  Allergies:  Allergies  Allergen Reactions  . Contrast Media [Iodinated Diagnostic Agents] Itching    Medications:                                                                              I have reviewed patient`s home medications                                             Current Facility-Administered Medications  Medication Dose Route Frequency Provider Last Rate Last Admin  .  stroke: mapping our early stages of recovery book   Does not apply Once Williams, Jessica N, NP      . alteplase (ACTIVASE) 1 mg/mL infusion 79.1 mg  0.9 mg/kg Intravenous Once Garvin Fila, MD 79.1 mL/hr at 10/16/19 1358 79.1 mg at 10/16/19 1358   Followed by  . 0.9 %  sodium chloride infusion  50 mL Intravenous Once Garvin Fila, MD      . 0.9 %  sodium chloride infusion   Intravenous Continuous Vonzella Nipple, NP      . acetaminophen (TYLENOL) tablet 650 mg  650 mg Oral Q4H PRN Vonzella Nipple, NP       Or  . acetaminophen (TYLENOL) 160 MG/5ML solution 650 mg  650 mg Per Tube Q4H PRN Vonzella Nipple, NP       Or  . acetaminophen (TYLENOL) suppository 650 mg  650 mg Rectal Q4H PRN  Vonzella Nipple, NP      . labetalol (NORMODYNE) injection 20 mg  20 mg Intravenous Once Vonzella Nipple, NP       And  . clevidipine (CLEVIPREX) infusion 0.5 mg/mL  0-21 mg/hr Intravenous Continuous Vonzella Nipple, NP      . pantoprazole (PROTONIX) injection 40 mg  40 mg Intravenous QHS Vonzella Nipple, NP      . senna-docusate (Senokot-S) tablet 1 tablet  1 tablet Oral QHS PRN Vonzella Nipple, NP       Current Outpatient Medications  Medication Sig Dispense Refill  . acetaminophen (TYLENOL) 500 MG tablet Take 1,000 mg by mouth 2 (two) times daily as needed for mild pain or headache (sleep).     . Continuous Blood Gluc Receiver (FREESTYLE LIBRE 14 DAY READER) DEVI 1 Device by Does not apply route daily as needed. 1 Device 0  . Continuous Blood Gluc Sensor (FREESTYLE LIBRE 14 DAY SENSOR) MISC 1 Device by Does not apply route daily. 3 each 4  . feeding supplement, ENSURE ENLIVE, (ENSURE ENLIVE) LIQD Take 237 mLs by mouth 3 (three) times daily between meals. (Patient not taking: Reported on 11/01/2017) 237 mL 12  . glucose blood (FREESTYLE  PRECISION NEO TEST) test strip Used to check blood sugars up to 3 times a day. 150 each 3  . HUMALOG KWIKPEN 100 UNIT/ML KwikPen Inject 12-14 Units into the skin daily as needed.    . hydrochlorothiazide (HYDRODIURIL) 25 MG tablet Take 25 mg by mouth at bedtime.    . insulin aspart (NOVOLOG FLEXPEN) 100 UNIT/ML FlexPen INJECT 10 UNITS INTO THE SKIN 3 TIMES DAILY WITH MEALS (Patient not taking: Reported on 10/12/2019) 15 pen 1  . Insulin Detemir (LEVEMIR) 100 UNIT/ML Pen Inject 25 Units into the skin at bedtime. (Patient not taking: Reported on 10/12/2019) 5 pen 11  . Insulin Pen Needle (BD PEN NEEDLE MICRO U/F) 32G X 6 MM MISC 1 each by Other route See admin instructions. Use one pen needle to inject insulin 4 times daily. **must have appointment for additional refills.** 150 each 0  . Insulin Pen Needle (BD PEN NEEDLE MICRO U/F) 32G X 6 MM MISC  USE 1 PEN NEEDLE SIX TIMES DAILY 200 each 2  . lansoprazole (PREVACID) 15 MG capsule Take 15 mg by mouth at bedtime.    Marland Kitchen lisinopril (PRINIVIL,ZESTRIL) 40 MG tablet Take 40 mg by mouth at bedtime.   0  . Nebivolol HCl (BYSTOLIC) 20 MG TABS Take 20 mg by mouth at bedtime.    Marland Kitchen omeprazole (PRILOSEC) 40 MG capsule Take 1 capsule (40 mg total) by mouth daily with breakfast. (Patient taking differently: Take 40 mg by mouth daily as needed. ) 30 capsule 0  . pantoprazole (PROTONIX) 20 MG tablet Take 1 tablet (20 mg total) by mouth 2 (two) times daily before a meal. 60 tablet 0  . polyethylene glycol (MIRALAX / GLYCOLAX) packet Take 17 g by mouth 2 (two) times daily. (Patient not taking: Reported on 11/01/2017) 14 each 0  . traZODone (DESYREL) 100 MG tablet Take 100 mg by mouth at bedtime.  0  . TRESIBA FLEXTOUCH 100 UNIT/ML FlexTouch Pen Inject 24 Units into the skin in the morning.        ROS:                                                                                                                                      Unable to obtain d/t AMS   General Examination:                                                                                                      Pulse 70, resp. rate 17, weight 87.9  kg, SpO2 100 %.  Physical Exam  Constitutional: Appears well-developed and well-nourished.  Eyes: Normal external eye and conjunctiva. HENT: Normocephalic, no lesions, without obvious abnormality.   Musculoskeletal-no joint tenderness, deformity or swelling Cardiovascular: Normal rate and regular rhythm.  Respiratory: Effort normal, non-labored breathing saturations WNL GI: Soft.  No distension. There is no tenderness.  Skin: WDI  Neurological Examination Mental Status: Alert,not oriented.  Diminished attention, registration and recall.  Nonfluent speech with word hesitancy but impaired comprehension also able to follow some simple commands  Cranial Nerves: II: blinks to threat  bilaterally, eyes cross midline. PERRL hearing normal bilaterally.  Subtle right lower facial asymmetry.  Tongue midline. Motor: Able to raise anti-gravity with drift in all 4 extremities.  But not very cooperative for detailed testing. Tone and bulk:normal tone throughout; no atrophy noted Sensory:  light touch appears intact throughout, bilaterally Cerebellar: No ataxia noted Gait: deferred   NIHSS 12 Lab Results: Basic Metabolic Panel: Recent Labs  Lab 10/12/19 0946 10/16/19 1341 10/16/19 1346  NA 139 137 139  K 3.3* 3.3* 3.3*  CL 104 103 102  CO2 26 19*  --   GLUCOSE 143* 110* 105*  BUN 21 29* 28*  CREATININE 1.25* 2.22* 2.20*  CALCIUM 9.1 9.1  --     CBC: Recent Labs  Lab 10/12/19 0946 10/16/19 1341 10/16/19 1346  WBC 5.8 8.5  --   NEUTROABS  --  6.2  --   HGB 14.0 13.6 12.9*  HCT 39.2 38.9* 38.0*  MCV 90.3 90.7  --   PLT 162 208  --     CBG: Recent Labs  Lab 10/16/19 1342  GLUCAP 103*    Imaging: CT HEAD CODE STROKE WO CONTRAST  Result Date: 10/16/2019 CLINICAL DATA:  Code stroke.  Slurred speech, confusion, unsteady EXAM: CT HEAD WITHOUT CONTRAST TECHNIQUE: Contiguous axial images were obtained from the base of the skull through the vertex without intravenous contrast. COMPARISON:  None. FINDINGS: Motion artifact is present. Brain: There is no acute intracranial hemorrhage, mass effect, or edema. Gray-white differentiation is preserved. Prominence of the ventricles sulci reflects mild generalized parenchymal volume loss. Patchy hypoattenuation in the supratentorial white matter is nonspecific but may reflect mild chronic microvascular ischemic changes. Vascular: No hyperdense vessel. Mild intracranial atherosclerotic calcification at the skull base. Skull: Unremarkable. Sinuses/Orbits: No acute abnormality. Other: Mastoid air cells are clear. ASPECTS ([place] Stroke Program Early CT Score) - Ganglionic level infarction (caudate, lentiform nuclei, internal  capsule, insula, M1-M3 cortex): 7 - Supraganglionic infarction (M4-M6 cortex): 3 Total score (0-10 with 10 being normal): 10 IMPRESSION: No acute intracranial hemorrhage or evidence of acute infarction. ASPECT score is 10. Mild chronic microvascular ischemic changes. These results were communicated to Dr. Leonie Man at 2:00 pmon 7/9/2021by text page via the [facility] messaging system. Electronically Signed   By: Macy Mis M.D.   On: 10/16/2019 14:03       Laurey Morale, MSN, NP-C [facility] [phone]  10/16/2019, 2:16 PM   Attending physician note to follow with Assessment and plan .   Assessment: 64 y.o. male With PMH HTN, DM2, cancer who presented to [facility] ED as a code stroke for confusion, slurred speech and gait instability.the [facility] was negative for hemorrhage. TPA was initiated. CTA was not performed given patient allergy to contrast dye and creatinine of 2.2. STAT MRI and MRA ordered.  Stroke Risk Factors - diabetes mellitus and hypertension    Plan:   CVA: -- BP goal :post tPA is 180/143mmHg, --  MRI Brain  --MRA of the head w/o and neck with contrast --Echocardiogram --carotid US -- High intensity Statin if LDL > 70 -- HgbA1c, fasting lipid panel -- PT consult, OT consult, Speech consult --Telemetry monitoring --Frequent neuro checks --Stroke swallow screen    HTN: Monitor BP 180/105 Labetalol / cleviprex to maintain goal SBP< 180  DM: hgba1c SSI ACHS  AKI- contrast related - monitor BUN/creatinine -Iv Hydartion NS 125 cc/hr May need nephrology consult   Hypokalemic: Potassium replacement Re-check potassium  DVT prophylaxis:SCD's only Gi prophylaxis: doc/senna Disposition: TBD  Code status: FULL CODE  ATTENDING PHYSICIAN NOTE :  I have personally obtained history,examined this patient, reviewed notes, independently viewed imaging studies, participated in medical decision making and plan of care.ROS completed by me personally and pertinent  positives fully documented  I have made any additions or clarifications directly to the above note. Agree with note above. Marland Kitchen He has presented with sudden onset of confusion and speech difficulties with questionable right face weakness.  There has been no definite witnessed seizure activity.  He probably has a distal left middle cerebral artery infarct with aphasia though worsening renal failure could have contributed to encephalopathy as well.  I have reviewed contraindications for TPA and not found any and he will be given IV TPA as per protocol.  Close blood pressure control and neurological monitoring as per post TPA protocol.  Admit to neuro ICU/stroke progressive unit.  Check MRI scan of the brain, echocardiogram, lipid profile hemoglobin A1c.  IV hydration with normal saline 125 cc an hour as his renal failure appears to be  rellated from recent CT scan few days ago. if MRI is negative for stroke check EEG for seizures.  I personally spoke to the patient's girlfriend over the phone and obtained history and discuss risk benefits of TPA and answered questions.  No family member or listed health power of attorney was available at the time of this consultation. This patient is critically ill and at significant risk of neurological worsening, death and care requires constant monitoring of vital signs, hemodynamics,respiratory and cardiac monitoring, extensive review of multiple databases, frequent neurological assessment, discussion with family, other specialists and medical decision making of high complexity.I have made any additions or clarifications directly to the above note.This critical care time does not reflect procedure time, or teaching time or supervisory time of PA/NP/Med Resident etc but could involve care discussion time.  I spent 60 minutes of neurocritical care time  in the care of  this patient.     Antony Contras, MD Medical Director [facility] Pager: [phone] 10/16/2019  4:40 PM  --Please page the Stroke team from 8am-4pm.   You can look them up on [email or web address]

## 2019-10-16 NOTE — Code Documentation (Signed)
Stroke Response Nurse Documentation Code Documentation  Jesse Lane is a 64 y.o. male arriving to [facility] ED via [facility] on 10/16/19 with past medical hx of hypertension, diabetes, and cancer. Code stroke was activated by EMS. Patient from home where he was LKW at 1145. Went to the grocery store with his girlfriend. She later noted he had slurred speech, couldn't follow commands and was confused.   Stroke team at the bedside on patient arrival. Labs drawn, CBG 105, and patient cleared for CT by Dr. Sabra Heck. Patient to CT with team. NIHSS 12, see documentation for details and code stroke times. Patient with disoriented, not following commands, right facial droop, bilateral arm weakness, bilateral leg weakness, Global aphasia  and dysarthria  on exam. Weakness related to inability to follow commands. Patient able to raise all extremities, but would not keep them up as instructed for exam.   CT completed. Patient is a candidate for tPA. BP 181/67 at 1354. Second PIV started. IV tPA started at 1358. DBP elevated post tPA- 20mg  labetalol given IV.Patient has elevated creatinine of 2.2 and recent history of contrast allergy. Plan for MRI in place of CTA. Patient taken back to ED room awaiting MRI. Dr. Leonie Man discussed case with Dr. Karenann Cai. Patient likely will not be compliant with MRI. CTA ordered. NS started and benadryl IV given. CTA completed. No LVO seen. Patient admitted to 4N ICU. Bedside handoff with ICU RN Susannah.    Leverne Humbles Stroke Response RN

## 2019-10-16 NOTE — Progress Notes (Signed)
TPA NS flush hung.

## 2019-10-16 NOTE — ED Triage Notes (Signed)
Pt with GF at grocery store; GF noted pt confused, having slurred speech, not following commands, L gaze; LKW 1145

## 2019-10-16 NOTE — ED Provider Notes (Signed)
[facility] EMERGENCY DEPARTMENT Provider Note   CSN: [id number] Arrival date & time: 10/16/19  1336  An emergency department physician performed an initial assessment on this suspected stroke patient at 1338.  History No chief complaint on file.   Jesse Lane is a 64 y.o. male.  HPI   This patient arrives by ambulance transport after having a witnessed decline in his mental status while in the shopping center with his significant other.  According to the paramedics the patient was in his usual state of health but started to have difficulty talking and becoming confused which gradually worsened and became more dense.  They were called to the scene to find the patient with left upper extremity weakness and left-sided facial droop with and.  Left gaze difficulty.  Level 5 caveat secondary to the patient's inability to speak right and acuity of symptoms.  Last known well was approximately 2 hours prior to arrival.  I was present on arrival and evaluated the patient with Dr. Leonie Man.  Past Medical History:  Diagnosis Date  . Cancer ([place])   . Diabetes mellitus type 2, uncontrolled ([place])   . Diabetic neuropathy ([place])   . History of stomach ulcers   . Hypertension     Patient Active Problem List   Diagnosis Date Noted  . Stroke ([place]) 10/16/2019  . Multinodular goiter 12/27/2016  . Diabetes ([place]) 11/26/2016  . Protein-calorie malnutrition, severe 09/17/2016  . DKA (diabetic ketoacidoses) ([place]) 09/17/2016  . Diabetic ketoacidosis without coma associated with type 2 diabetes mellitus ([place])   . Fecal impaction in rectum ([place])   . Hyperglycemia 09/16/2016  . Hyperglycemic hyperosmolar nonketotic coma ([place]) 09/16/2016  . High anion gap metabolic acidosis 35/32/9924  . Lactic acidosis 09/16/2016  . Anemia of chronic disease 09/16/2016  . Diabetic neuropathy ([place]) 09/06/2016  . Essential hypertension 09/06/2016  . Malignant melanoma of axilla ([place]) 08/12/2016     Past Surgical History:  Procedure Laterality Date  . COLONOSCOPY    . INGUINAL HERNIA REPAIR N/A 08/15/2015   Procedure: HERNIA REPAIR INGUINAL INCARCERATED;  Surgeon: Alphonsa Overall, MD;  Location: [facility] ORS;  Service: General;  Laterality: N/A;  with MESH  . pyloric stenosis    . TONSILLECTOMY         Family History  Problem Relation Age of Onset  . Breast cancer Mother   . Bone cancer Mother   . Melanoma Mother   . Prostate cancer Father   . Diabetes Neg Hx     Social History   Tobacco Use  . Smoking status: Former Smoker    Packs/day: 1.00    Years: 5.00    Pack years: 5.00    Types: Cigarettes  . Smokeless tobacco: Never Used  Vaping Use  . Vaping Use: Never used  Substance Use Topics  . Alcohol use: Yes    Alcohol/week: 1.0 standard drink    Types: 1 Glasses of wine per week    Comment: daily  . Drug use: Yes    Types: Marijuana    Comment: daily    Home Medications Prior to Admission medications   Medication Sig Start Date End Date Taking? Authorizing Provider  acetaminophen (TYLENOL) 500 MG tablet Take 1,000 mg by mouth 2 (two) times daily as needed for mild pain or headache (sleep).     [provider]  Continuous Blood Gluc Receiver (FREESTYLE LIBRE 14 DAY READER) DEVI 1 Device by Does not apply route daily as needed. 08/27/17  Renato Shin, MD  Continuous Blood Gluc Sensor (FREESTYLE LIBRE 14 DAY SENSOR) MISC 1 Device by Does not apply route daily. 02/28/18   Renato Shin, MD  feeding supplement, ENSURE ENLIVE, (ENSURE ENLIVE) LIQD Take 237 mLs by mouth 3 (three) times daily between meals. Patient not taking: Reported on 11/01/2017 09/17/16   [name], [name], MD  glucose blood (FREESTYLE PRECISION NEO TEST) test strip Used to check blood sugars up to 3 times a day. 09/27/17   Renato Shin, MD  HUMALOG KWIKPEN 100 UNIT/ML KwikPen Inject 12-14 Units into the skin daily as needed. 10/09/19   [provider]  hydrochlorothiazide  (HYDRODIURIL) 25 MG tablet Take 25 mg by mouth at bedtime.    [provider]  insulin aspart (NOVOLOG FLEXPEN) 100 UNIT/ML FlexPen INJECT 10 UNITS INTO THE SKIN 3 TIMES DAILY WITH MEALS Patient not taking: Reported on 10/12/2019 06/19/18   Renato Shin, MD  Insulin Detemir (LEVEMIR) 100 UNIT/ML Pen Inject 25 Units into the skin at bedtime. Patient not taking: Reported on 10/12/2019 10/25/17   Renato Shin, MD  Insulin Pen Needle (BD PEN NEEDLE MICRO U/F) 32G X 6 MM MISC 1 each by Other route See admin instructions. Use one pen needle to inject insulin 4 times daily. **must have appointment for additional refills.** 06/30/18   Renato Shin, MD  Insulin Pen Needle (BD PEN NEEDLE MICRO U/F) 32G X 6 MM MISC USE 1 PEN NEEDLE SIX TIMES DAILY 07/02/18   Renato Shin, MD  lansoprazole (PREVACID) 15 MG capsule Take 15 mg by mouth at bedtime.    [provider]  lisinopril (PRINIVIL,ZESTRIL) 40 MG tablet Take 40 mg by mouth at bedtime.  07/24/16   [provider]  Nebivolol HCl (BYSTOLIC) 20 MG TABS Take 20 mg by mouth at bedtime.    [provider]  omeprazole (PRILOSEC) 40 MG capsule Take 1 capsule (40 mg total) by mouth daily with breakfast. Patient taking differently: Take 40 mg by mouth daily as needed.  09/17/16 10/17/16  [name], [name], MD  pantoprazole (PROTONIX) 20 MG tablet Take 1 tablet (20 mg total) by mouth 2 (two) times daily before a meal. 10/12/19   Virgel Manifold, MD  polyethylene glycol (MIRALAX / Floria Raveling) packet Take 17 g by mouth 2 (two) times daily. Patient not taking: Reported on 11/01/2017 09/17/16   [name], [name], MD  traZODone (DESYREL) 100 MG tablet Take 100 mg by mouth at bedtime. 07/24/16   [provider]  TRESIBA FLEXTOUCH 100 UNIT/ML FlexTouch Pen Inject 24 Units into the skin in the morning. 07/02/19   [provider]    Allergies    Contrast media [iodinated diagnostic agents]  Review of Systems   Review of  Systems  Unable to perform ROS: Acuity of condition    Physical Exam Updated Vital Signs BP (!) 179/87   Pulse 70   Temp 98.6 F (37 C) (Axillary)   Resp 17   Wt 87.9 kg   SpO2 100%   BMI 27.03 kg/m   Physical Exam Vitals and nursing note reviewed.  Constitutional:      General: He is in acute distress.     Appearance: He is well-developed.  HENT:     Head: Normocephalic and atraumatic.     Mouth/Throat:     Pharynx: No oropharyngeal exudate.  Eyes:     General: No scleral icterus.       Right eye: No discharge.        Left  eye: No discharge.     Conjunctiva/sclera: Conjunctivae normal.     Pupils: Pupils are equal, round, and reactive to light.  Neck:     Thyroid: No thyromegaly.     Vascular: No JVD.  Cardiovascular:     Rate and Rhythm: Normal rate and regular rhythm.     Heart sounds: Normal heart sounds. No murmur heard.  No friction rub. No gallop.   Pulmonary:     Effort: Pulmonary effort is normal. No respiratory distress.     Breath sounds: Normal breath sounds. No wheezing or rales.  Abdominal:     General: Bowel sounds are normal. There is no distension.     Palpations: Abdomen is soft. There is no mass.     Tenderness: There is no abdominal tenderness.  Musculoskeletal:        General: No tenderness. Normal range of motion.     Cervical back: Normal range of motion and neck supple.  Lymphadenopathy:     Cervical: No cervical adenopathy.  Skin:    General: Skin is warm and dry.     Findings: No erythema or rash.  Neurological:     Mental Status: He is alert.     Coordination: Coordination normal.     Comments: Left upper extremity weakness, difficulty with leftward gaze, difficulty with speech, left-sided facial droop.  Right arm and leg appear normal  Psychiatric:        Behavior: Behavior normal.     ED Results / Procedures / Treatments   Labs (all labs ordered are listed, but only abnormal results are displayed) Labs Reviewed  CBC -  Abnormal; Notable for the following components:      Result Value   HCT 38.9 (*)    All other components within normal limits  COMPREHENSIVE METABOLIC PANEL - Abnormal; Notable for the following components:   Potassium 3.3 (*)    CO2 19 (*)    Glucose, Bld 110 (*)    BUN 29 (*)    Creatinine, Ser 2.22 (*)    Total Bilirubin 1.9 (*)    GFR calc non Af Amer 30 (*)    GFR calc Af Amer 35 (*)    All other components within normal limits  I-STAT CHEM 8, ED - Abnormal; Notable for the following components:   Potassium 3.3 (*)    BUN 28 (*)    Creatinine, Ser 2.20 (*)    Glucose, Bld 105 (*)    Calcium, Ion 0.99 (*)    TCO2 20 (*)    Hemoglobin 12.9 (*)    HCT 38.0 (*)    All other components within normal limits  CBG MONITORING, ED - Abnormal; Notable for the following components:   Glucose-Capillary 103 (*)    All other components within normal limits  SARS CORONAVIRUS 2 BY RT PCR (HOSPITAL ORDER, [facility])  ETHANOL  PROTIME-INR  APTT  DIFFERENTIAL  RAPID URINE DRUG SCREEN, HOSP PERFORMED  URINALYSIS, ROUTINE W REFLEX MICROSCOPIC  HIV ANTIBODY (ROUTINE TESTING W REFLEX)    EKG None  Radiology CT HEAD CODE STROKE WO CONTRAST  Result Date: 10/16/2019 CLINICAL DATA:  Code stroke.  Slurred speech, confusion, unsteady EXAM: CT HEAD WITHOUT CONTRAST TECHNIQUE: Contiguous axial images were obtained from the base of the skull through the vertex without intravenous contrast. COMPARISON:  None. FINDINGS: Motion artifact is present. Brain: There is no acute intracranial hemorrhage, mass effect, or edema. Gray-white differentiation is preserved. Prominence of the  ventricles sulci reflects mild generalized parenchymal volume loss. Patchy hypoattenuation in the supratentorial white matter is nonspecific but may reflect mild chronic microvascular ischemic changes. Vascular: No hyperdense vessel. Mild intracranial atherosclerotic calcification at the skull base.  Skull: Unremarkable. Sinuses/Orbits: No acute abnormality. Other: Mastoid air cells are clear. ASPECTS ([place] Stroke Program Early CT Score) - Ganglionic level infarction (caudate, lentiform nuclei, internal capsule, insula, M1-M3 cortex): 7 - Supraganglionic infarction (M4-M6 cortex): 3 Total score (0-10 with 10 being normal): 10 IMPRESSION: No acute intracranial hemorrhage or evidence of acute infarction. ASPECT score is 10. Mild chronic microvascular ischemic changes. These results were communicated to Dr. Leonie Man at 2:00 pmon 7/9/2021by text page via the [facility] messaging system. Electronically Signed   By: Macy Mis M.D.   On: 10/16/2019 14:03    Procedures .Critical Care Performed by: Noemi Chapel, MD Authorized by: Noemi Chapel, MD   Critical care provider statement:    Critical care time (minutes):  35   Critical care time was exclusive of:  Separately billable procedures and treating other patients and teaching time   Critical care was necessary to treat or prevent imminent or life-threatening deterioration of the following conditions:  CNS failure or compromise   Critical care was time spent personally by me on the following activities:  Blood draw for specimens, development of treatment plan with patient or surrogate, discussions with consultants, evaluation of patient's response to treatment, examination of patient, obtaining history from patient or surrogate, ordering and performing treatments and interventions, ordering and review of laboratory studies, ordering and review of radiographic studies, pulse oximetry, re-evaluation of patient's condition and review of old charts   (including critical care time)  Medications Ordered in ED Medications  alteplase (ACTIVASE) 1 mg/mL infusion 79.1 mg (79.1 mg Intravenous New Bag/Given 10/16/19 1358)    Followed by  0.9 %  sodium chloride infusion (has no administration in time range)   stroke: mapping our early stages of recovery book  (has no administration in time range)  0.9 %  sodium chloride infusion (has no administration in time range)  acetaminophen (TYLENOL) tablet 650 mg (has no administration in time range)    Or  acetaminophen (TYLENOL) 160 MG/5ML solution 650 mg (has no administration in time range)    Or  acetaminophen (TYLENOL) suppository 650 mg (has no administration in time range)  senna-docusate (Senokot-S) tablet 1 tablet (has no administration in time range)  pantoprazole (PROTONIX) injection 40 mg (has no administration in time range)  labetalol (NORMODYNE) injection 20 mg (has no administration in time range)    And  clevidipine (CLEVIPREX) infusion 0.5 mg/mL (has no administration in time range)  0.9 %  sodium chloride infusion (has no administration in time range)    ED Course  I have reviewed the triage vital signs and the nursing notes.  Pertinent labs & imaging results that were available during my care of the patient were reviewed by me and considered in my medical decision making (see chart for details).    MDM Rules/Calculators/A&P                           This patient is ill-appearing with what appears to be an acute stroke, he does qualify for TPA, neurology is taking the patient to CT, getting TPA, needs an ICU bed.  Labs reveal a creatinine of 2.2 which is new from July 5 when it was 1.2, his potassium was 3.3  CT scan  reveals no acute hemorrhage.  On repeat exam this patient has fairly symmetrical drift of both of his arms and both of his legs, significant difficulty following commands, he has both a receptive and expressive aphasia, he is mumbling the same nonsensical answers to most questions.  Pt is critically ill - Neuro involved and will admit to ICU  Jesse Lane was evaluated in Emergency Department on 10/16/2019 for the symptoms described in the history of present illness. He was evaluated in the context of the global COVID-19 pandemic, which necessitated  consideration that the patient might be at risk for infection with the SARS-CoV-2 virus that causes COVID-19. Institutional protocols and algorithms that pertain to the evaluation of patients at risk for COVID-19 are in a state of rapid change based on information released by regulatory bodies including the CDC and federal and state organizations. These policies and algorithms were followed during the patient's care in the ED.  Final Clinical Impression(s) / ED Diagnoses Final diagnoses:  Acute ischemic stroke ([place])  Hypokalemia  AKI (acute kidney injury) ([place])      Noemi Chapel, MD 10/17/19 1029

## 2019-10-17 ENCOUNTER — Inpatient Hospital Stay (HOSPITAL_COMMUNITY): Payer: Medicare Other

## 2019-10-17 DIAGNOSIS — E785 Hyperlipidemia, unspecified: Secondary | ICD-10-CM

## 2019-10-17 DIAGNOSIS — E7801 Familial hypercholesterolemia: Secondary | ICD-10-CM

## 2019-10-17 DIAGNOSIS — E1159 Type 2 diabetes mellitus with other circulatory complications: Secondary | ICD-10-CM

## 2019-10-17 DIAGNOSIS — I161 Hypertensive emergency: Secondary | ICD-10-CM

## 2019-10-17 DIAGNOSIS — N179 Acute kidney failure, unspecified: Principal | ICD-10-CM

## 2019-10-17 DIAGNOSIS — E876 Hypokalemia: Secondary | ICD-10-CM

## 2019-10-17 DIAGNOSIS — G9341 Metabolic encephalopathy: Secondary | ICD-10-CM

## 2019-10-17 DIAGNOSIS — I6389 Other cerebral infarction: Secondary | ICD-10-CM

## 2019-10-17 DIAGNOSIS — R739 Hyperglycemia, unspecified: Secondary | ICD-10-CM

## 2019-10-17 DIAGNOSIS — Z794 Long term (current) use of insulin: Secondary | ICD-10-CM

## 2019-10-17 DIAGNOSIS — F17201 Nicotine dependence, unspecified, in remission: Secondary | ICD-10-CM

## 2019-10-17 DIAGNOSIS — I639 Cerebral infarction, unspecified: Secondary | ICD-10-CM

## 2019-10-17 LAB — CBC
HCT: 33.5 % — ABNORMAL LOW (ref 39.0–52.0)
Hemoglobin: 11.9 g/dL — ABNORMAL LOW (ref 13.0–17.0)
MCH: 32.4 pg (ref 26.0–34.0)
MCHC: 35.5 g/dL (ref 30.0–36.0)
MCV: 91.3 fL (ref 80.0–100.0)
Platelets: 168 10*3/uL (ref 150–400)
RBC: 3.67 MIL/uL — ABNORMAL LOW (ref 4.22–5.81)
RDW: 12.9 % (ref 11.5–15.5)
WBC: 5.9 10*3/uL (ref 4.0–10.5)
nRBC: 0 % (ref 0.0–0.2)

## 2019-10-17 LAB — LIPID PANEL
Cholesterol: 150 mg/dL (ref 0–200)
HDL: 23 mg/dL — ABNORMAL LOW (ref >40)
LDL Cholesterol: 90 mg/dL (ref 0–99)
Total CHOL/HDL Ratio: 6.5 RATIO
Triglycerides: 184 mg/dL — ABNORMAL HIGH (ref <150)
VLDL: 37 mg/dL (ref 0–40)

## 2019-10-17 LAB — HEMOGLOBIN A1C
Hgb A1c MFr Bld: 6.4 % — ABNORMAL HIGH (ref 4.8–5.6)
Mean Plasma Glucose: 136.98 mg/dL

## 2019-10-17 LAB — GLUCOSE, CAPILLARY
Glucose-Capillary: 196 mg/dL — ABNORMAL HIGH (ref 70–99)
Glucose-Capillary: 203 mg/dL — ABNORMAL HIGH (ref 70–99)
Glucose-Capillary: 226 mg/dL — ABNORMAL HIGH (ref 70–99)
Glucose-Capillary: 274 mg/dL — ABNORMAL HIGH (ref 70–99)
Glucose-Capillary: 336 mg/dL — ABNORMAL HIGH (ref 70–99)
Glucose-Capillary: 342 mg/dL — ABNORMAL HIGH (ref 70–99)

## 2019-10-17 LAB — BASIC METABOLIC PANEL
Anion gap: 9 (ref 5–15)
BUN: 25 mg/dL — ABNORMAL HIGH (ref 8–23)
CO2: 25 mmol/L (ref 22–32)
Calcium: 8.3 mg/dL — ABNORMAL LOW (ref 8.9–10.3)
Chloride: 106 mmol/L (ref 98–111)
Creatinine, Ser: 1.54 mg/dL — ABNORMAL HIGH (ref 0.61–1.24)
GFR calc Af Amer: 54 mL/min — ABNORMAL LOW (ref >60)
GFR calc non Af Amer: 47 mL/min — ABNORMAL LOW (ref >60)
Glucose, Bld: 241 mg/dL — ABNORMAL HIGH (ref 70–99)
Potassium: 3.2 mmol/L — ABNORMAL LOW (ref 3.5–5.1)
Sodium: 140 mmol/L (ref 135–145)

## 2019-10-17 LAB — ECHOCARDIOGRAM COMPLETE: Weight: 3100.55 oz

## 2019-10-17 MED ORDER — INSULIN ASPART 100 UNIT/ML ~~LOC~~ SOLN
0.0000 [IU] | SUBCUTANEOUS | Status: DC
  Administered 2019-10-17 (×2): 3 [IU] via SUBCUTANEOUS
  Administered 2019-10-17: 7 [IU] via SUBCUTANEOUS
  Administered 2019-10-17: 2 [IU] via SUBCUTANEOUS

## 2019-10-17 MED ORDER — ASPIRIN EC 81 MG PO TBEC
81.0000 mg | DELAYED_RELEASE_TABLET | Freq: Every day | ORAL | Status: DC
  Administered 2019-10-18: 81 mg via ORAL
  Filled 2019-10-17: qty 1

## 2019-10-17 MED ORDER — MONTELUKAST SODIUM 10 MG PO TABS
10.0000 mg | ORAL_TABLET | Freq: Every day | ORAL | Status: DC
  Administered 2019-10-17: 10 mg via ORAL
  Filled 2019-10-17 (×2): qty 1

## 2019-10-17 MED ORDER — INSULIN GLARGINE 100 UNIT/ML ~~LOC~~ SOLN
25.0000 [IU] | Freq: Every day | SUBCUTANEOUS | Status: DC
  Administered 2019-10-18: 25 [IU] via SUBCUTANEOUS
  Filled 2019-10-17: qty 0.25

## 2019-10-17 MED ORDER — CHLORHEXIDINE GLUCONATE CLOTH 2 % EX PADS: 6.0000 | MEDICATED_PAD | Freq: Every day | CUTANEOUS | Status: DC

## 2019-10-17 MED ORDER — PANTOPRAZOLE SODIUM 20 MG PO TBEC
20.0000 mg | DELAYED_RELEASE_TABLET | Freq: Two times a day (BID) | ORAL | Status: DC
  Administered 2019-10-17 – 2019-10-18 (×2): 20 mg via ORAL
  Filled 2019-10-17 (×3): qty 1

## 2019-10-17 MED ORDER — POTASSIUM CHLORIDE CRYS ER 20 MEQ PO TBCR
40.0000 meq | EXTENDED_RELEASE_TABLET | Freq: Two times a day (BID) | ORAL | Status: AC
  Administered 2019-10-17 (×2): 40 meq via ORAL
  Filled 2019-10-17 (×2): qty 2

## 2019-10-17 MED ORDER — INSULIN ASPART 100 UNIT/ML ~~LOC~~ SOLN
0.0000 [IU] | Freq: Three times a day (TID) | SUBCUTANEOUS | Status: DC
  Administered 2019-10-18: 11 [IU] via SUBCUTANEOUS

## 2019-10-17 MED ORDER — INSULIN GLARGINE 100 UNIT/ML ~~LOC~~ SOLN
20.0000 [IU] | Freq: Every day | SUBCUTANEOUS | Status: DC
  Administered 2019-10-17: 20 [IU] via SUBCUTANEOUS
  Filled 2019-10-17: qty 0.2

## 2019-10-17 MED ORDER — NEBIVOLOL HCL 10 MG PO TABS
20.0000 mg | ORAL_TABLET | Freq: Every day | ORAL | Status: DC
  Administered 2019-10-17: 20 mg via ORAL
  Filled 2019-10-17 (×2): qty 2

## 2019-10-17 MED ORDER — LISINOPRIL 20 MG PO TABS
40.0000 mg | ORAL_TABLET | Freq: Every day | ORAL | Status: DC
  Administered 2019-10-17: 40 mg via ORAL
  Filled 2019-10-17: qty 2

## 2019-10-17 MED ORDER — SUCRALFATE 1 G PO TABS
1.0000 g | ORAL_TABLET | Freq: Four times a day (QID) | ORAL | Status: DC
  Administered 2019-10-17 – 2019-10-18 (×4): 1 g via ORAL
  Filled 2019-10-17 (×7): qty 1

## 2019-10-17 MED ORDER — ATORVASTATIN CALCIUM 40 MG PO TABS
40.0000 mg | ORAL_TABLET | Freq: Every day | ORAL | Status: DC
  Administered 2019-10-18: 40 mg via ORAL
  Filled 2019-10-17: qty 1

## 2019-10-17 MED ORDER — ASPIRIN EC 325 MG PO TBEC
325.0000 mg | DELAYED_RELEASE_TABLET | Freq: Every day | ORAL | Status: DC
  Administered 2019-10-17: 325 mg via ORAL
  Filled 2019-10-17: qty 1

## 2019-10-17 MED ORDER — ATORVASTATIN CALCIUM 80 MG PO TABS
80.0000 mg | ORAL_TABLET | Freq: Every day | ORAL | Status: DC
  Administered 2019-10-17: 80 mg via ORAL
  Filled 2019-10-17: qty 1

## 2019-10-17 MED ORDER — INSULIN ASPART 100 UNIT/ML ~~LOC~~ SOLN
0.0000 [IU] | Freq: Every day | SUBCUTANEOUS | Status: DC
  Administered 2019-10-17: 4 [IU] via SUBCUTANEOUS

## 2019-10-17 NOTE — Progress Notes (Signed)
  Echocardiogram 2D Echocardiogram has been performed.  Jesse Lane 10/17/2019, 4:03 PM

## 2019-10-17 NOTE — Progress Notes (Signed)
Patient transported to and from CT without incident.

## 2019-10-17 NOTE — Progress Notes (Signed)
All MRI scanners down. Stat CT ordered for post TPA follow up.

## 2019-10-17 NOTE — Progress Notes (Signed)
STROKE TEAM PROGRESS NOTE   INTERVAL HISTORY His RN and wife are at the bedside.  As per wife, pt yesterday was shopping and had acute onset confusion, not able to find car, not able to get into the car and then not able to get out of the car. Later sitting at home in chair, not able to talk and staring off. Pt stated that he remembered sitting in chair but then can not remember later until woke up in the ICU. No hx of seizure, denies HA or migraine hx. Currently pt back to his baseline. Cre improved also.    OBJECTIVE Vitals:   10/17/19 0300 10/17/19 0400 10/17/19 0500 10/17/19 0600  BP: 140/62 (!) 163/80 (!) 162/86 (!) 166/85  Pulse: 66 67 66 66  Resp: 20 (!) 21 18 19   Temp:  98 F (36.7 C)    TempSrc:  Oral    SpO2: 93% 94% 92% 93%  Weight:        CBC:  Recent Labs  Lab 10/16/19 1341 10/16/19 1341 10/16/19 1346 10/17/19 0255  WBC 8.5  --   --  5.9  NEUTROABS 6.2  --   --   --   HGB 13.6   < > 12.9* 11.9*  HCT 38.9*   < > 38.0* 33.5*  MCV 90.7  --   --  91.3  PLT 208  --   --  168   < > = values in this interval not displayed.    Basic Metabolic Panel:  Recent Labs  Lab 10/16/19 1341 10/16/19 1341 10/16/19 1346 10/17/19 0255  NA 137   < > 139 140  K 3.3*   < > 3.3* 3.2*  CL 103   < > 102 106  CO2 19*  --   --  25  GLUCOSE 110*   < > 105* 241*  BUN 29*   < > 28* 25*  CREATININE 2.22*   < > 2.20* 1.54*  CALCIUM 9.1  --   --  8.3*   < > = values in this interval not displayed.    Lipid Panel:     Component Value Date/Time   CHOL 150 10/17/2019 0255   TRIG 184 (H) 10/17/2019 0255   HDL 23 (L) 10/17/2019 0255   CHOLHDL 6.5 10/17/2019 0255   VLDL 37 10/17/2019 0255   LDLCALC 90 10/17/2019 0255   HgbA1c:  Lab Results  Component Value Date   HGBA1C 6.4 (H) 10/17/2019   Urine Drug Screen:     Component Value Date/Time   LABOPIA NONE DETECTED 10/16/2019 1624   COCAINSCRNUR NONE DETECTED 10/16/2019 1624   LABBENZ NONE DETECTED 10/16/2019 1624    AMPHETMU NONE DETECTED 10/16/2019 1624   THCU POSITIVE (A) 10/16/2019 1624   LABBARB NONE DETECTED 10/16/2019 1624    Alcohol Level     Component Value Date/Time   ETH <10 10/16/2019 1341    IMAGING  CT HEAD WO CONTRAST  10/17/2019 IMPRESSION:  1. No acute intracranial abnormality.  2. Generalized atrophy and chronic microvascular ischemia.    CT HEAD CODE STROKE WO CONTRAST 10/16/2019 IMPRESSION:  No acute intracranial hemorrhage or evidence of acute infarction. ASPECT score is 10. Mild chronic microvascular ischemic changes.  CT ANGIO HEAD CODE STROKE CT ANGIO NECK CODE STROKE 10/16/2019 IMPRESSION:  Motion degraded. No large vessel occlusion or hemodynamically significant stenosis.   MRI / MRA Head - pending  Transthoracic Echocardiogram 1. Left ventricular ejection fraction, by estimation, is 65 to 70%. The  left ventricle has normal function. The left ventricle has no regional  wall motion abnormalities. There is moderate concentric left ventricular  hypertrophy. Left ventricular  diastolic parameters are consistent with Grade I diastolic dysfunction  (impaired relaxation). Elevated left atrial pressure.  2. Right ventricular systolic function is normal. The right ventricular  size is normal.  3. The mitral valve is normal in structure. Trivial mitral valve  regurgitation. No evidence of mitral stenosis.  4. The aortic valve is normal in structure. Aortic valve regurgitation is  not visualized. No aortic stenosis is present.  5. The inferior vena cava is normal in size with greater than 50%  respiratory variability, suggesting right atrial pressure of 3 mmHg.  EEG This is a mildly abnormal EEG secondary to mild posterior background slowing.  This finding may be seen with a diffuse gray matter disturbance that is etiologically nonspecific, but may include a dementia, among other possibilities.  No epileptiform activity is noted.     PHYSICAL EXAM  Temp:  [97.7  F (36.5 C)-98.7 F (37.1 C)] 97.7 F (36.5 C) (07/10 0800) Pulse Rate:  [66-86] 69 (07/10 0800) Resp:  [0-26] 18 (07/10 0800) BP: (116-188)/(57-136) 154/83 (07/10 0800) SpO2:  [92 %-100 %] 94 % (07/10 0800) Weight:  [87.9 kg] 87.9 kg (07/09 1300)  General - Well nourished, well developed, in no apparent distress.  Ophthalmologic - fundi not visualized due to noncooperation.  Cardiovascular - Regular rhythm and rate.  Mental Status -  Level of arousal and orientation to time, place, and person were intact. Language including expression, naming, repetition, comprehension was assessed and found intact. Fund of Knowledge was assessed and was intact.  Cranial Nerves II - XII - II - Visual field intact OU. III, IV, VI - Extraocular movements intact. V - Facial sensation intact bilaterally. VII - Facial movement intact bilaterally. VIII - Hearing & vestibular intact bilaterally. X - Palate elevates symmetrically. XI - Chin turning & shoulder shrug intact bilaterally. XII - Tongue protrusion intact.  Motor Strength - The patient's strength was normal in all extremities and pronator drift was absent.  Bulk was normal and fasciculations were absent.   Motor Tone - Muscle tone was assessed at the neck and appendages and was normal.  Reflexes - The patient's reflexes were symmetrical in all extremities and he had no pathological reflexes.  Sensory - Light touch, temperature/pinprick were assessed and were symmetrical.    Coordination - The patient had normal movements in the hands and feet with no ataxia or dysmetria.  Tremor was absent.  Gait and Station - deferred.   ASSESSMENT/PLAN Mr. NEVAAN BUNTON is a 64 y.o. male with history of HTN, DM2, cancer, hx of stomach ulcers, recent contrast CT for abdominal pain, AKI and hx of contrast allergy presenting with confusion and aphasia. He  received IV t-PA 10/16/19 at 1400.  TIA vs. Encephalopathy (AKI or hypertensive) - MRI / MRA  pending  Code Stroke CT Head - 7/9 - No acute intracranial hemorrhage or evidence of acute infarction. ASPECT score is 10.   CT head - 7/10 - No acute intracranial abnormality. Generalized atrophy and chronic microvascular ischemia.   MRI head - pending  MRA head - pending   CTA H&N - Motion degraded. No large vessel occlusion or hemodynamically significant stenosis.   2D Echo EF 65-70%  EEG - no seizure, mild posterior background slowing  [employer] Virus 2 - negative  LDL - 90  HgbA1c - 6.4  UDS +  THC  VTE prophylaxis - SCDs  No antithrombotic prior to admission, now on ASA 81. Continue ASA 81 on discharge.  Patient counseled to be compliant with his antithrombotic medications  Ongoing aggressive stroke risk factor management  Therapy recommendations:  outpt PT  Disposition:  Pending  AKI  Creatinine 1.1->1.25->2.22->1.54  On IV fluid  Encourage p.o. intake  BMP monitoring  Hypertension  Home BP meds: Zestril ; Bystolic ; HCTZ  Resume lisinopril and bystolic  Hold off HCTZ given elevated Cre  Stable on the high end . Long-term BP goal normotensive  Hyperlipidemia  Home Lipid lowering medication: none   LDL 90, goal < 70  Current lipid lowering medication: Lipitor 40 mg daily  Continue statin at discharge  Diabetes Hyperglycemia  Home diabetic meds: insulin  HgbA1c 6.4, goal < 7.0  SSI  On Lantus  CBG monitoring  Close endocrinologist follow-up  Other Stroke Risk Factors  Advanced age  Former cigarette smoker - quit  ETOH use, advised to drink no more than 1 alcoholic beverage per day.  Overweight, Body mass index is 27.03 kg/m., recommend weight loss, diet and exercise as appropriate   Substance Abuse - marijuana  Other Active Problems  Code status - Full code  Hx of gastric ulcers - Carafate, Prevacid and Prilosec PTA (now on Protonix)   Check CXR  Hypokalemia - 3.2 - supplement  Hospital day # 1  This  patient is critically ill due to strokelike symptoms status post TPA, hyperglycemia, hypertensive emergency and at significant risk of neurological worsening, death form recurrent stroke, hemorrhagic conversion, hemorrhagic shock, DKA, hypertensive encephalopathy, seizure. This patient's care requires constant monitoring of vital signs, hemodynamics, respiratory and cardiac monitoring, review of multiple databases, neurological assessment, discussion with family, other specialists and medical decision making of high complexity. I spent 40 minutes of neurocritical care time in the care of this patient. I had long discussion with patient and wife at bedside, updated pt current condition, treatment plan and potential prognosis, and answered all the questions.  They expressed understanding and appreciation.   Rosalin Hawking, MD PhD Stroke Neurology 10/17/2019 7:03 PM    To contact Stroke Continuity provider, please refer to [email or web address] After hours, contact General Neurology

## 2019-10-17 NOTE — Progress Notes (Signed)
EEG complete - results pending 

## 2019-10-17 NOTE — Evaluation (Signed)
Physical Therapy Evaluation Patient Details Name: ROMAIN ERION MRN: [id number] DOB: [birth year] Today's Date: 10/17/2019   History of Present Illness  This 64 yo male admitted with with confusion, slurred speech, and gait instability.  CT of brain showed no acute abnormality.  CTA showed no LVO.  tPA was administered.  PMH includes: HTN, h/o stomach ulcers, Diabetic neuropathy, DM type 2, melignant melanoma of axilla     Clinical Impression  Pt presenting near baseline. Pt with noted bilat LE weakness in which he was attending [facility] prior to [place] but hasn't been since. Provided pt with theraband for LE seated exercise program. Pt functioning at supervision level except for min guard on stairs. Acute PT to cont to follow.    Follow Up Recommendations Outpatient PT (resume outpt, was going to [facility] [facility])    Equipment Recommendations  None recommended by PT    Recommendations for Other Services       Precautions / Restrictions Precautions Precautions: Fall Restrictions Weight Bearing Restrictions: No      Mobility  Bed Mobility Overal bed mobility: Independent                Transfers Overall transfer level: Modified independent Equipment used: None             General transfer comment: no difficulty  Ambulation/Gait Ambulation/Gait assistance: Min guard Gait Distance (Feet): 300 Feet Assistive device: None Gait Pattern/deviations: Step-through pattern;Decreased stride length Gait velocity: dec   General Gait Details: decreased step height, trunk flexed, no episode of LOB  Stairs Stairs: Yes Stairs assistance: Min guard Stair Management: One rail Left;Step to pattern;Forwards Number of Stairs: 2 General stair comments: pt reports difficulty with stairs due ot LE weakness, dependent on railing  Wheelchair Mobility    Modified Rankin (Stroke Patients Only)       Balance Overall balance assessment: Mild deficits observed, not  formally tested                               Standardized Balance Assessment Standardized Balance Assessment : Dynamic Gait Index   Dynamic Gait Index Level Surface: Normal Change in Gait Speed: Normal Gait with Horizontal Head Turns: Mild Impairment Gait with Vertical Head Turns: Mild Impairment Gait and Pivot Turn: Mild Impairment Step Over Obstacle: Mild Impairment Step Around Obstacles: Mild Impairment Steps: Mild Impairment Total Score: 18       Pertinent Vitals/Pain      Home Living Family/patient expects to be discharged to:: Private residence Living Arrangements: Spouse/significant other Available Help at Discharge: Family Type of Home: House Home Access: Stairs to enter Entrance Stairs-Rails: None (has a chair he leans on ) Entrance Stairs-Number of Steps: 2 Home Layout: One level   Additional Comments: lives with his girlfriend and roommate     Prior Function Level of Independence: Independent         Comments: pt reports he was fully independent with ADLs and functional mobility.  He reports he has had a 1.5 year h/o bil LE weakness due to CA or CA treatments.  He denies any falls in the past year.  He is retired - was a Freight forwarder for a [employer].  He drives      Hand Dominance   Dominant Hand: Left    Extremity/Trunk Assessment   Upper Extremity Assessment Upper Extremity Assessment: Overall WFL for tasks assessed    Lower Extremity Assessment  Lower Extremity Assessment: Generalized weakness (from cancer treatements, was going to outpt PT)    Cervical / Trunk Assessment Cervical / Trunk Assessment: Normal  Communication   Communication: No difficulties  Cognition Arousal/Alertness: Awake/alert Behavior During Therapy: WFL for tasks assessed/performed Overall Cognitive Status: Within Functional Limits for tasks assessed                                 General Comments: Pt able to follow a  3 step command and scored 4/28 on the Short Blessed Test which is in normal range       General Comments General comments (skin integrity, edema, etc.): VSS    Exercises Other Exercises Other Exercises: pt given orange and green theraband with a seated LE exercises program, pt with verbal understanding   Assessment/Plan    PT Assessment Patient needs continued PT services  PT Problem List Decreased strength;Decreased activity tolerance;Decreased balance;Decreased mobility;Decreased coordination;Decreased range of motion       PT Treatment Interventions DME instruction;Gait training;Stair training;Functional mobility training;Therapeutic activities;Therapeutic exercise;Balance training;Neuromuscular re-education    PT Goals (Current goals can be found in the Care Plan section)  Acute Rehab PT Goals Patient Stated Goal: to go home  Additional Goals Additional Goal #1: Pt to score >19 on DGI to indicate minimal falls risk.    Frequency Min 4X/week   Barriers to discharge        Co-evaluation               AM-PAC PT "6 Clicks" Mobility  Outcome Measure Help needed turning from your back to your side while in a flat bed without using bedrails?: None Help needed moving from lying on your back to sitting on the side of a flat bed without using bedrails?: None Help needed moving to and from a bed to a chair (including a wheelchair)?: None Help needed standing up from a chair using your arms (e.g., wheelchair or bedside chair)?: None Help needed to walk in hospital room?: A Little Help needed climbing 3-5 steps with a railing? : A Little 6 Click Score: 22    End of Session   Activity Tolerance: Patient tolerated treatment well Patient left: in chair;with call bell/phone within reach;with family/visitor present Nurse Communication: Mobility status PT Visit Diagnosis: Muscle weakness (generalized) (M62.81);Difficulty in walking, not elsewhere classified (R26.2)    Time:  9678-9381 PT Time Calculation (min) (ACUTE ONLY): 24 min   Charges:   PT Evaluation $PT Eval Low Complexity: 1 Low PT Treatments $Therapeutic Exercise: 8-22 mins        Kittie Plater, PT, DPT Acute Rehabilitation Services Pager #: [phone] Office #: [phone]   Berline Lopes 10/17/2019, 2:21 PM

## 2019-10-17 NOTE — Evaluation (Signed)
Speech Language Pathology Evaluation Patient Details Name: MAKOA SATZ MRN: [id number] DOB: 1955/09/04 Today's Date: 10/17/2019 Time: 5929-2446 SLP Time Calculation (min) (ACUTE ONLY): 14 min  Problem List:  Patient Active Problem List   Diagnosis Date Noted  . Stroke ([place]) 10/16/2019  . Multinodular goiter 12/27/2016  . Diabetes ([place]) 11/26/2016  . Protein-calorie malnutrition, severe 09/17/2016  . DKA (diabetic ketoacidoses) ([place]) 09/17/2016  . Diabetic ketoacidosis without coma associated with type 2 diabetes mellitus ([place])   . Fecal impaction in rectum ([place])   . Hyperglycemia 09/16/2016  . Hyperglycemic hyperosmolar nonketotic coma ([place]) 09/16/2016  . High anion gap metabolic acidosis 28/63/8177  . Lactic acidosis 09/16/2016  . Anemia of chronic disease 09/16/2016  . Diabetic neuropathy ([place]) 09/06/2016  . Essential hypertension 09/06/2016  . Malignant melanoma of axilla ([place]) 08/12/2016   Past Medical History:  Past Medical History:  Diagnosis Date  . Cancer ([place])   . Diabetes mellitus type 2, uncontrolled ([place])   . Diabetic neuropathy ([place])   . History of stomach ulcers   . Hypertension    Past Surgical History:  Past Surgical History:  Procedure Laterality Date  . COLONOSCOPY    . INGUINAL HERNIA REPAIR N/A 08/15/2015   Procedure: HERNIA REPAIR INGUINAL INCARCERATED;  Surgeon: Alphonsa Overall, MD;  Location: [facility] ORS;  Service: General;  Laterality: N/A;  with MESH  . pyloric stenosis    . TONSILLECTOMY     HPI:  64 y.o. male With PMH HTN, DM2, cancer who presented to [facility] ED as a code stroke for confusion, dysarthria and gait instability. The [facility] was negative for hemorrhage. TPA was initiated.   Assessment / Plan / Recommendation Clinical Impression  Pt presents with resolved symptoms - cognition is [facility] with adequate attention, recall, and awareness.  Speech is fluent and clear without dysarthria.  Expressive and receptive language are WNL.  No SLP needs are  identified.  Our service will sign off.    SLP Assessment  SLP Recommendation/Assessment: Patient does not need any further Speech Lanaguage Pathology Services SLP Visit Diagnosis: Cognitive communication deficit (R41.841)    Follow Up Recommendations     none  Frequency and Duration           SLP Evaluation Cognition  Overall Cognitive Status: Within Functional Limits for tasks assessed Arousal/Alertness: Awake/alert Orientation Level: Oriented X4 Attention: Selective Selective Attention: Appears intact Memory: Appears intact Awareness: Appears intact Problem Solving: Appears intact       Comprehension  Auditory Comprehension Overall Auditory Comprehension: Appears within functional limits for tasks assessed Reading Comprehension Reading Status: Not tested    Expression Expression Primary Mode of Expression: Verbal Verbal Expression Overall Verbal Expression: Appears within functional limits for tasks assessed   Oral / Motor  Oral Motor/Sensory Function Overall Oral Motor/Sensory Function: Within functional limits Motor Speech Overall Motor Speech: Appears within functional limits for tasks assessed   GO                    Juan Quam Laurice 10/17/2019, 10:58 AM   Estill Bamberg L. Tivis Ringer, [place] Office number [phone] Pager [phone]

## 2019-10-17 NOTE — Procedures (Signed)
ELECTROENCEPHALOGRAM REPORT   Patient: Jesse Lane       Room #: 7V72Q  Age: 64 y.o.        Sex: male Requesting Physician: Leonie Man Report Date:  10/17/2019        Interpreting Physician: Alexis Goodell  History: Jesse Lane is an 64 y.o. male with acute onset confusion  Medications:  Cleviprex  Conditions of Recording:  This is a 21 channel routine scalp EEG performed with bipolar and monopolar montages arranged in accordance to the international 10/20 system of electrode placement. One channel was dedicated to EKG recording.  The patient is in the awake, drowsy and asleep states.  Description:  The waking background activity consists of a low voltage, symmetrical, fairly well organized, 7-8 Hz theta activity, seen from the parieto-occipital and posterior temporal regions.  Low voltage fast activity, poorly organized, is seen anteriorly and is at times superimposed on more posterior regions.  A mixture of theta and alpha rhythms are seen from the central and temporal regions. The patient drowses with slowing to irregular, low voltage theta and beta activity.   The patient goes in to a light sleep with symmetrical sleep spindles, vertex central sharp transients and irregular slow activity.  No epileptiform activity is noted.   Hyperventilation and intermittent photic stimulation were not performed.  IMPRESSION: This is a mildly abnormal EEG secondary to mild posterior background slowing.  This finding may be seen with a diffuse gray matter disturbance that is etiologically nonspecific, but may include a dementia, among other possibilities.  No epileptiform activity is noted.      Alexis Goodell, MD Neurology [phone] 10/17/2019, 10:57 AM

## 2019-10-17 NOTE — Evaluation (Signed)
Occupational Therapy Evaluation Patient Details Name: Jesse Lane MRN: [id number] DOB: 07-Feb-1956 Today's Date: 10/17/2019    History of Present Illness This 64 yo male admitted with with confusion, slurred speech, and gait instability.  CT of brain showed no acute abnormality.  CTA showed no LVO.  tPA was administered.  PMH includes: HTN, h/o stomach ulcers, Diabetic neuropathy, DM type 2, melignant melanoma of axilla    Clinical Impression   Patient evaluated by Occupational Therapy with no further acute OT needs identified. All education has been completed and the patient has no further questions. Pt appears to be back to baseline and is Independent - mod I with ADLs.  No apparent cognitive deficits noted. See below for any follow-up Occupational Therapy or equipment needs. OT is signing off. Thank you for this referral.      Follow Up Recommendations  No OT follow up    Equipment Recommendations  None recommended by OT    Recommendations for Other Services       Precautions / Restrictions Precautions Precautions: Fall      Mobility Bed Mobility Overal bed mobility: Independent                Transfers Overall transfer level: Modified independent                    Balance Overall balance assessment: Mild deficits observed, not formally tested                                         ADL either performed or assessed with clinical judgement   ADL                                               Vision Baseline Vision/History: Wears glasses Wears Glasses:  (for driving ) Patient Visual Report: No change from baseline Vision Assessment?: Yes Eye Alignment: Within Functional Limits Ocular Range of Motion: Within Functional Limits Alignment/Gaze Preference: Within Defined Limits Tracking/Visual Pursuits: Able to track stimulus in all quads without difficulty Saccades: Within functional limits Convergence: Within  functional limits     Perception Perception Perception Tested?: Yes   Praxis Praxis Praxis tested?: Within functional limits    Pertinent Vitals/Pain       Hand Dominance Left   Extremity/Trunk Assessment Upper Extremity Assessment Upper Extremity Assessment: Overall WFL for tasks assessed       Cervical / Trunk Assessment Cervical / Trunk Assessment: Normal   Communication Communication Communication: No difficulties   Cognition Arousal/Alertness: Awake/alert Behavior During Therapy: WFL for tasks assessed/performed Overall Cognitive Status: Within Functional Limits for tasks assessed                                 General Comments: Pt able to follow a 3 step command and scored 4/28 on the Short Blessed Test which is in normal range    General Comments       Exercises     Shoulder Instructions      Home Living Family/patient expects to be discharged to:: Private residence Living Arrangements: Spouse/significant other Available Help at Discharge: Family Type of Home: House Home Access: Stairs to enter [employer] of Steps: 2  Entrance Stairs-Rails: None (has a chair he leans on ) Home Layout: One level     Bathroom Shower/Tub: Tub/shower unit;Curtain   Biochemist, clinical: Standard         Additional Comments: lives with his girlfriend and roommate   Lives With: Significant other    Prior Functioning/Environment Level of Independence: Independent        Comments: pt reports he was fully independent with ADLs and functional mobility.  He reports he has had a 1.5 year h/o bil LE weakness due to CA or CA treatments.  He denies any falls in the past year.  He is retired - was a Freight forwarder for a [employer].  He drives         OT Problem List: Impaired balance (sitting and/or standing)      OT Treatment/Interventions:      OT Goals(Current goals can be found in the care plan section) Acute Rehab OT  Goals Patient Stated Goal: to go home  OT Goal Formulation: All assessment and education complete, DC therapy  OT Frequency:     Barriers to D/C:            Co-evaluation              AM-PAC OT "6 Clicks" Daily Activity     Outcome Measure Help from another person eating meals?: None Help from another person taking care of personal grooming?: None Help from another person toileting, which includes using toliet, bedpan, or urinal?: None Help from another person bathing (including washing, rinsing, drying)?: None Help from another person to put on and taking off regular upper body clothing?: None Help from another person to put on and taking off regular lower body clothing?: None 6 Click Score: 24   End of Session Equipment Utilized During Treatment: Gait belt Nurse Communication: Mobility status  Activity Tolerance: Patient tolerated treatment well Patient left: in chair;with call bell/phone within reach;with chair alarm set  OT Visit Diagnosis: Unsteadiness on feet (R26.81)                Time: 6378-5885 OT Time Calculation (min): 36 min Charges:  OT General Charges $OT Visit: 1 Visit OT Evaluation $OT Eval Low Complexity: 1 Low OT Treatments $Therapeutic Activity: 8-22 mins  Nilsa Nutting., OTR/L Acute Rehabilitation Services Pager [phone] Office [phone]   Lucille Passy M 10/17/2019, 12:46 PM

## 2019-10-18 DIAGNOSIS — I674 Hypertensive encephalopathy: Secondary | ICD-10-CM

## 2019-10-18 DIAGNOSIS — I10 Essential (primary) hypertension: Secondary | ICD-10-CM

## 2019-10-18 LAB — BASIC METABOLIC PANEL
Anion gap: 8 (ref 5–15)
BUN: 17 mg/dL (ref 8–23)
CO2: 24 mmol/L (ref 22–32)
Calcium: 8.5 mg/dL — ABNORMAL LOW (ref 8.9–10.3)
Chloride: 105 mmol/L (ref 98–111)
Creatinine, Ser: 1.14 mg/dL (ref 0.61–1.24)
GFR calc Af Amer: >60 mL/min (ref >60)
GFR calc non Af Amer: >60 mL/min (ref >60)
Glucose, Bld: 306 mg/dL — ABNORMAL HIGH (ref 70–99)
Potassium: 3.8 mmol/L (ref 3.5–5.1)
Sodium: 137 mmol/L (ref 135–145)

## 2019-10-18 LAB — CBC
HCT: 34.5 % — ABNORMAL LOW (ref 39.0–52.0)
Hemoglobin: 12 g/dL — ABNORMAL LOW (ref 13.0–17.0)
MCH: 32 pg (ref 26.0–34.0)
MCHC: 34.8 g/dL (ref 30.0–36.0)
MCV: 92 fL (ref 80.0–100.0)
Platelets: 173 10*3/uL (ref 150–400)
RBC: 3.75 MIL/uL — ABNORMAL LOW (ref 4.22–5.81)
RDW: 12.8 % (ref 11.5–15.5)
WBC: 4.4 10*3/uL (ref 4.0–10.5)
nRBC: 0 % (ref 0.0–0.2)

## 2019-10-18 LAB — GLUCOSE, CAPILLARY: Glucose-Capillary: 350 mg/dL — ABNORMAL HIGH (ref 70–99)

## 2019-10-18 MED ORDER — ATORVASTATIN CALCIUM 40 MG PO TABS: 40.0000 mg | ORAL_TABLET | Freq: Every day | ORAL | 1 refills | Status: DC

## 2019-10-18 MED ORDER — ASPIRIN 81 MG PO TBEC: 81.0000 mg | DELAYED_RELEASE_TABLET | Freq: Every day | ORAL | 11 refills | Status: DC

## 2019-10-18 NOTE — Discharge Summary (Addendum)
Patient ID: Jesse Lane   MRN: [id number]      DOB: 1955/08/30  Date of Admission: 10/16/2019 Date of Discharge: 10/18/2019  Attending Physician:  Rosalin Hawking, MD, Stroke MD Consultant(s):  none Patient's PCP:  Marda Stalker, PA-C  DISCHARGE DIAGNOSIS:   Primary diagnosis   Encephalopathy due to AKI and hypertension  Secondary diagnosis  Stroke Like symptoms treated with IV tPA  Diabetes Mellitus Type 2  Hypertension  Dyslipidemia  AKI  Hypertensive emergency  Hypokalemia - treated and resolved  THC use  History of gastric ulcers    Past Medical History:  Diagnosis Date  . Cancer ([place])   . Diabetes mellitus type 2, uncontrolled ([place])   . Diabetic neuropathy ([place])   . History of stomach ulcers   . Hypertension    Past Surgical History:  Procedure Laterality Date  . COLONOSCOPY    . INGUINAL HERNIA REPAIR N/A 08/15/2015   Procedure: HERNIA REPAIR INGUINAL INCARCERATED;  Surgeon: Alphonsa Overall, MD;  Location: [facility];  Service: General;  Laterality: N/A;  with MESH  . pyloric stenosis    . TONSILLECTOMY      Family History Family History  Problem Relation Age of Onset  . Breast cancer Mother   . Bone cancer Mother   . Melanoma Mother   . Prostate cancer Father   . Diabetes Neg Hx     Social History  reports that he has quit smoking. His smoking use included cigarettes. He has a 5.00 pack-year smoking history. He has never used smokeless tobacco. He reports current alcohol use of about 1.0 standard drink of alcohol per week. He reports current drug use. Drug: Marijuana.  Allergies as of 10/18/2019      Reactions   Contrast Media [iodinated Diagnostic Agents] Itching      Medication List    TAKE these medications   acetaminophen 500 MG tablet Commonly known as: TYLENOL Take 1,000 mg by mouth 2 (two) times daily as needed for mild pain or headache (sleep).   aspirin 81 MG EC tablet Take 1 tablet (81 mg total) by mouth daily. Swallow  whole. Start taking on: October 19, 2019   atorvastatin 40 MG tablet Commonly known as: LIPITOR Take 1 tablet (40 mg total) by mouth daily. Start taking on: October 19, 1939   Bystolic 20 MG Tabs Generic drug: Nebivolol HCl Take 20 mg by mouth at bedtime.   FreeStyle Libre 14 Day Reader Kerrin Mo 1 Device by Does not apply route daily as needed.   FreeStyle Libre 14 Day Sensor Misc 1 Device by Does not apply route daily.   glucose blood test strip Commonly known as: FreeStyle Precision Neo Test Used to check blood sugars up to 3 times a day. What changed:   how much to take  how to take this  when to take this   HumaLOG KwikPen 100 UNIT/ML KwikPen Generic drug: insulin lispro Inject 10-12 Units into the skin See admin instructions. Injection 10-12 units three times daily before meals as needed: for CBG 180-200 10 units, >200 12 units   hydrochlorothiazide 25 MG tablet Commonly known as: HYDRODIURIL Take 25 mg by mouth at bedtime.   Insulin Pen Needle 32G X 6 MM Misc Commonly known as: BD Pen Needle Micro U/F 1 each by Other route See admin instructions. Use one pen needle to inject insulin 4 times daily. **must have appointment for additional refills.** What changed: Another medication with the same name was changed.  Make sure you understand how and when to take each.   Insulin Pen Needle 32G X 6 MM Misc Commonly known as: BD Pen Needle Micro U/F USE 1 PEN NEEDLE SIX TIMES DAILY What changed:   how much to take  how to take this  when to take this   lansoprazole 15 MG capsule Commonly known as: PREVACID Take 15 mg by mouth at bedtime.   lisinopril 40 MG tablet Commonly known as: ZESTRIL Take 40 mg by mouth at bedtime.   Melatonin 10 MG Tabs Take 20 mg by mouth at bedtime.   montelukast 10 MG tablet Commonly known as: SINGULAIR Take 10 mg by mouth at bedtime.   omeprazole 40 MG capsule Commonly known as: PRILOSEC Take 1 capsule (40 mg total) by mouth daily  with breakfast. What changed:   when to take this  reasons to take this   pantoprazole 20 MG tablet Commonly known as: PROTONIX Take 1 tablet (20 mg total) by mouth 2 (two) times daily before a meal.   pregabalin 100 MG capsule Commonly known as: LYRICA Take 100-300 mg by mouth See admin instructions. Take one capsule (100 mg) by mouth every morning and afternoon, take three capsules (300 mg) at bedtime   sucralfate 1 g tablet Commonly known as: CARAFATE Take 1 g by mouth 4 (four) times daily.   traZODone 100 MG tablet Commonly known as: DESYREL Take 100 mg by mouth at bedtime.   Tyler Aas FlexTouch 100 UNIT/ML FlexTouch Pen Generic drug: insulin degludec Inject 24 Units into the skin daily before breakfast.       HOME MEDICATIONS PRIOR TO ADMISSION Medications Prior to Admission  Medication Sig Dispense Refill  . acetaminophen (TYLENOL) 500 MG tablet Take 1,000 mg by mouth 2 (two) times daily as needed for mild pain or headache (sleep).     . hydrochlorothiazide (HYDRODIURIL) 25 MG tablet Take 25 mg by mouth at bedtime.    . insulin degludec (TRESIBA FLEXTOUCH) 100 UNIT/ML FlexTouch Pen Inject 24 Units into the skin daily before breakfast.    . insulin lispro (HUMALOG KWIKPEN) 100 UNIT/ML KwikPen Inject 10-12 Units into the skin See admin instructions. Injection 10-12 units three times daily before meals as needed: for CBG 180-200 10 units, >200 12 units    . lansoprazole (PREVACID) 15 MG capsule Take 15 mg by mouth at bedtime.    Marland Kitchen lisinopril (PRINIVIL,ZESTRIL) 40 MG tablet Take 40 mg by mouth at bedtime.   0  . Melatonin 10 MG TABS Take 20 mg by mouth at bedtime.    . montelukast (SINGULAIR) 10 MG tablet Take 10 mg by mouth at bedtime.     . Nebivolol HCl (BYSTOLIC) 20 MG TABS Take 20 mg by mouth at bedtime.    Marland Kitchen omeprazole (PRILOSEC) 40 MG capsule Take 1 capsule (40 mg total) by mouth daily with breakfast. (Patient taking differently: Take 40 mg by mouth at bedtime as  needed (acid reflux/heartburn). ) 30 capsule 0  . pantoprazole (PROTONIX) 20 MG tablet Take 1 tablet (20 mg total) by mouth 2 (two) times daily before a meal. 60 tablet 0  . pregabalin (LYRICA) 100 MG capsule Take 100-300 mg by mouth See admin instructions. Take one capsule (100 mg) by mouth every morning and afternoon, take three capsules (300 mg) at bedtime    . sucralfate (CARAFATE) 1 g tablet Take 1 g by mouth 4 (four) times daily.    . traZODone (DESYREL) 100 MG tablet Take 100 mg by  mouth at bedtime.  0  . Continuous Blood Gluc Receiver (FREESTYLE LIBRE 14 DAY READER) DEVI 1 Device by Does not apply route daily as needed. 1 Device 0  . Continuous Blood Gluc Sensor (FREESTYLE LIBRE 14 DAY SENSOR) MISC 1 Device by Does not apply route daily. 3 each 4  . glucose blood (FREESTYLE PRECISION NEO TEST) test strip Used to check blood sugars up to 3 times a day. (Patient taking differently: 1 each by Other route See admin instructions. Used to check blood sugars up to 3 times a day.) 150 each 3  . Insulin Pen Needle (BD PEN NEEDLE MICRO U/F) 32G X 6 MM MISC 1 each by Other route See admin instructions. Use one pen needle to inject insulin 4 times daily. **must have appointment for additional refills.** 150 each 0  . Insulin Pen Needle (BD PEN NEEDLE MICRO U/F) 32G X 6 MM MISC USE 1 PEN NEEDLE SIX TIMES DAILY (Patient taking differently: 1 each by Other route See admin instructions. USE 1 PEN NEEDLE SIX TIMES DAILY) 200 each 2     HOSPITAL MEDICATIONS .  stroke: mapping our early stages of recovery book   Does not apply Once  . aspirin EC  81 mg Oral Daily  . atorvastatin  40 mg Oral Daily  . Chlorhexidine Gluconate Cloth  6 each Topical Daily  . insulin aspart  0-15 Units Subcutaneous TID WC  . insulin aspart  0-5 Units Subcutaneous QHS  . insulin glargine  25 Units Subcutaneous Daily  . lisinopril  40 mg Oral QHS  . montelukast  10 mg Oral QHS  . nebivolol  20 mg Oral QHS  . pantoprazole  20 mg  Oral BID AC  . sucralfate  1 g Oral QID    LABORATORY STUDIES CBC    Component Value Date/Time   WBC 4.4 10/18/2019 0339   RBC 3.75 (L) 10/18/2019 0339   HGB 12.0 (L) 10/18/2019 0339   HCT 34.5 (L) 10/18/2019 0339   PLT 173 10/18/2019 0339   MCV 92.0 10/18/2019 0339   MCH 32.0 10/18/2019 0339   MCHC 34.8 10/18/2019 0339   RDW 12.8 10/18/2019 0339   LYMPHSABS 1.7 10/16/2019 1341   MONOABS 0.6 10/16/2019 1341   EOSABS 0.0 10/16/2019 1341   BASOSABS 0.0 10/16/2019 1341   CMP    Component Value Date/Time   NA 137 10/18/2019 0339   K 3.8 10/18/2019 0339   CL 105 10/18/2019 0339   CO2 24 10/18/2019 0339   GLUCOSE 306 (H) 10/18/2019 0339   BUN 17 10/18/2019 0339   CREATININE 1.14 10/18/2019 0339   CALCIUM 8.5 (L) 10/18/2019 0339   PROT 6.9 10/16/2019 1341   ALBUMIN 4.0 10/16/2019 1341   AST 25 10/16/2019 1341   ALT 18 10/16/2019 1341   ALKPHOS 51 10/16/2019 1341   BILITOT 1.9 (H) 10/16/2019 1341   GFRNONAA >60 10/18/2019 0339   GFRAA >60 10/18/2019 0339   COAGS Lab Results  Component Value Date   INR 1.0 10/16/2019   INR 1.09 08/12/2016   Lipid Panel    Component Value Date/Time   CHOL 150 10/17/2019 0255   TRIG 184 (H) 10/17/2019 0255   HDL 23 (L) 10/17/2019 0255   CHOLHDL 6.5 10/17/2019 0255   VLDL 37 10/17/2019 0255   LDLCALC 90 10/17/2019 0255   HgbA1C  Lab Results  Component Value Date   HGBA1C 6.4 (H) 10/17/2019   Urinalysis    Component Value Date/Time   COLORURINE YELLOW  10/16/2019 [place] 10/16/2019 1624   LABSPEC 1.019 10/16/2019 1624   PHURINE 5.0 10/16/2019 1624   GLUCOSEU NEGATIVE 10/16/2019 1624   HGBUR SMALL (A) 10/16/2019 1624   BILIRUBINUR NEGATIVE 10/16/2019 1624   KETONESUR 5 (A) 10/16/2019 1624   PROTEINUR 100 (A) 10/16/2019 1624   UROBILINOGEN 0.2 12/05/2011 1655   NITRITE NEGATIVE 10/16/2019 1624   LEUKOCYTESUR NEGATIVE 10/16/2019 1624   Urine Drug Screen     Component Value Date/Time   LABOPIA NONE  DETECTED 10/16/2019 1624   COCAINSCRNUR NONE DETECTED 10/16/2019 1624   LABBENZ NONE DETECTED 10/16/2019 1624   AMPHETMU NONE DETECTED 10/16/2019 1624   THCU POSITIVE (A) 10/16/2019 1624   LABBARB NONE DETECTED 10/16/2019 1624    Alcohol Level    Component Value Date/Time   ETH <10 10/16/2019 1341     SIGNIFICANT DIAGNOSTIC STUDIES  CT HEAD WO CONTRAST  10/17/2019 IMPRESSION:  1. No acute intracranial abnormality.  2. Generalized atrophy and chronic microvascular ischemia.    CT HEAD CODE STROKE WO CONTRAST 10/16/2019 IMPRESSION:  No acute intracranial hemorrhage or evidence of acute infarction. ASPECT score is 10. Mild chronic microvascular ischemic changes.  CT ANGIO HEAD CODE STROKE CT ANGIO NECK CODE STROKE 10/16/2019 IMPRESSION:  Motion degraded. No large vessel occlusion or hemodynamically significant stenosis.   MRI / MRA Head 10/17/19 IMPRESSION: No acute finding. Brain volume loss. Moderate chronic small-vessel ischemic changes of the hemispheric white matter.  Innumerable punctate foci of hemosiderin deposition without evidence of acute hemorrhage. Findings likely relate to amyloid angiopathy. No large or medium vessel occlusion or correctable proximal stenosis.  DG Chest Port 1 View 10/17/19 IMPRESSION: Visualized lungs clear.  Heart upper normal in size.  No adenopathy.  Transthoracic Echocardiogram 1. Left ventricular ejection fraction, by estimation, is 65 to 70%. The  left ventricle has normal function. The left ventricle has no regional  wall motion abnormalities. There is moderate concentric left ventricular  hypertrophy. Left ventricular  diastolic parameters are consistent with Grade I diastolic dysfunction  (impaired relaxation). Elevated left atrial pressure.  2. Right ventricular systolic function is normal. The right ventricular  size is normal.  3. The mitral valve is normal in structure. Trivial mitral valve  regurgitation. No evidence of  mitral stenosis.  4. The aortic valve is normal in structure. Aortic valve regurgitation is  not visualized. No aortic stenosis is present.  5. The inferior vena cava is normal in size with greater than 50%  respiratory variability, suggesting right atrial pressure of 3 mmHg.  EEG This is amildlyabnormal EEG secondary to mildposterior background slowing. This finding may be seen with a diffuse gray matter disturbance that is etiologically nonspecific, but may include a dementia, among other possibilities. No epileptiform activity is noted.    HISTORY OF PRESENT ILLNESS (From Dr Clydene Fake H&P on 10/16/19) Jesse Lane is an 64 y.o. male  With PMH HTN, DM2, cancer who presented to [facility] ED as a code stroke for confusion, slurred speech and gait instability. History obtained from his girlfriend whom I spoke to over the phone and EMS brought the patient. Patient was in the grocery store with his girlfriend at 63 and was normal. While at the store he suddenly became confused, was staring off and had trouble expressing himself.  ED course:  CTH: no hemorrhage  BP: 146/76 BG:103 Date last known well:10/16/19 Time last known well: 1145 tPA Given: yes, Modified Rankin: Rankin Score=0 NIHSS:12 Code stroke CT scan of the head  showed no acute abnormality with normal aspect score.  CT angiogram was considered but initially not done due to patient serum creatinine being 2.2.  Patient was seen in [place], ER 2 days ago for abdominal pain and had undergone: CT scan of the abdomen and pelvis and received contrast.  Obtaining a stat MRI and MRA from work considered but patient was quite restless and would likely not be cooperative for the MRI.  Case was discussed with Dr.Katucia de Norma Fredrickson neuro interventionalists and the risk of missing an LVO without a CTA emergently was felt to overweigh the risk of worsening his renal function with the dye and was it was decided to do CT angiogram and hydrate the  patient with IV normal saline 125 cc an hour and give Benadryl 50 mg for history of hives with IV dye.  CT angiogram of the brain and neck was obtained which was negative for LVO.  HOSPITAL COURSE Mr. LAMERE LIGHTNER is a 64 y.o. male with history of HTN, DM2, cancer, hx of stomach ulcers, recent contrast CT for abdominal pain, AKI and hx of contrast allergy presenting with confusion and aphasia. He  received IV t-PA 10/16/19 at 1400.  Likely encephalopathy AKI or hypertensive Stroke like symptoms s/p tPA  Code Stroke CT Head - 7/9 - No acute intracranial hemorrhage or evidence of acute infarction. ASPECT score is 10.   CT head - 7/10 - No acute intracranial abnormality. Generalized atrophy and chronic microvascular ischemia.   MRI / MRA Head - No acute finding. Brain volume loss. Moderate chronic small-vessel ischemic changes of the hemispheric white matter. Innumerable punctate foci of hemosiderin deposition without evidence of acute hemorrhage. Findings likely relate to amyloid angiopathy. (however, by my review, the MCBs are mostly at periventricular WM, still more consistent with chronic HTN.)   CTA H&N - Motion degraded. No large vessel occlusion or hemodynamically significant stenosis.   2D Echo EF 65-70%  EEG - no seizure, mildposterior background slowing  [employer] Virus 2 - negative  LDL - 90  HgbA1c - 6.4  UDS + THC  VTE prophylaxis - SCDs  No antithrombotic prior to admission, now on ASA 81. Continue ASA 81 on discharge.  Patient counseled to be compliant with his antithrombotic medications  Ongoing aggressive stroke risk factor management  Therapy recommendations:  Outpt PT  Disposition:  Discharge to home  AKI, due to decreased oral intake at home with GI issue  Creatinine 2.22->1.54->1.14  On IV fluid  Encourage p.o. intake  BMP monitoring  Pt was educated on avoidance of dehydration at discharge  Hypertension  Home BP meds: Zestril ; Bystolic  ; HCTZ  Resume lisinopril and bystolic  Hold off HCTZ given elevated Cre - able to continue on discharge as Cre normalized  Stable on the high end  Long-term BP goal normotensive  Hyperlipidemia  Home Lipid lowering medication: none   LDL 90, goal < 70  Current lipid lowering medication: Lipitor 40 mg daily  Continue statin at discharge  Diabetes Hyperglycemia  Home diabetic meds: insulin  HgbA1c 6.4, goal < 7.0  SSI  On Lantus  CBG monitoring  Close endocrinologist follow-up  Other Stroke Risk Factors  Advanced age  Former cigarette smoker - quit  ETOH use, advised to drink no more than 1 alcoholic beverage per day.  Overweight, Body mass index is 27.03 kg/m., recommend weight loss, diet and exercise as appropriate   Substance Abuse - marijuana  Other Active Problems  Code  status - Full code  Hx of gastric ulcers - Carafate, Prevacid and Prilosec PTA (now on Protonix) - follow up with GI next week as scheduled.  Hypokalemia - 3.2 - supplement->3.8   DISCHARGE EXAM Vitals:   10/18/19 0500 10/18/19 0600 10/18/19 0800 10/18/19 0820  BP: (!) 162/72 (!) 182/88  (!) 180/100  Pulse: 65 68    Resp: 18 (!) 24  (!) 25  Temp:   (!) 97.4 F (36.3 C)   TempSrc:   Oral   SpO2:  98%  97%  Weight:       General - Well nourished, well developed, in no apparent distress.  Ophthalmologic - fundi not visualized due to noncooperation.  Cardiovascular - Regular rhythm and rate.  Mental Status -  Level of arousal and orientation to time, place, and person were intact. Language including expression, naming, repetition, comprehension was assessed and found intact. Fund of Knowledge was assessed and was intact.  Cranial Nerves II - XII - II - Visual field intact OU. III, IV, VI - Extraocular movements intact. V - Facial sensation intact bilaterally. VII - Facial movement intact bilaterally. VIII - Hearing & vestibular intact bilaterally. X - Palate  elevates symmetrically. XI - Chin turning & shoulder shrug intact bilaterally. XII - Tongue protrusion intact.  Motor Strength - The patient's strength was normal in all extremities and pronator drift was absent.  Bulk was normal and fasciculations were absent.   Motor Tone - Muscle tone was assessed at the neck and appendages and was normal.  Reflexes - The patient's reflexes were symmetrical in all extremities and he had no pathological reflexes.  Sensory - Light touch, temperature/pinprick were assessed and were symmetrical.    Coordination - The patient had normal movements in the hands and feet with no ataxia or dysmetria.  Tremor was absent.  Gait and Station - deferred.  DISCHARGE INSTRUCTIONS TO PATIENT   DISCHARGE DIET   Diet Order            Diet Carb Modified Fluid consistency: Thin; Room service appropriate? Yes  Diet effective now                liquids  DISCHARGE PLAN  Disposition:  Discharge to home  aspirin 81 mg daily for secondary stroke prevention.  Ongoing risk factor control by Primary Care Physician at time of discharge  Follow-up Marda Stalker, PA-C in 2 weeks.  Follow-up in [facility] in 4 weeks, office to schedule an appointment.   Follow up with GI as scheduled next week  40 minutes were spent preparing discharge.  Rosalin Hawking, MD PhD Stroke Neurology 10/18/2019 12:17 PM

## 2019-10-18 NOTE — Plan of Care (Signed)
Pt met all outcomes. Discharging home now

## 2019-10-18 NOTE — Progress Notes (Signed)
Pt d/c to home by car with family. Assessment stable. D/C instructions reviewed. All questions answered. 

## 2019-11-11 ENCOUNTER — Ambulatory Visit: Payer: Medicare Other | Admitting: Gastroenterology

## 2019-12-09 ENCOUNTER — Ambulatory Visit (INDEPENDENT_AMBULATORY_CARE_PROVIDER_SITE_OTHER): Payer: Medicare Other | Admitting: Physician Assistant

## 2019-12-09 ENCOUNTER — Encounter: Payer: Self-pay | Admitting: Physician Assistant

## 2019-12-09 VITALS — BP 120/80 | HR 64 | Ht 69.69 in | Wt 204.1 lb

## 2019-12-09 DIAGNOSIS — Z86010 Personal history of colonic polyps: Secondary | ICD-10-CM

## 2019-12-09 DIAGNOSIS — R110 Nausea: Secondary | ICD-10-CM | POA: Diagnosis not present

## 2019-12-09 DIAGNOSIS — R1013 Epigastric pain: Secondary | ICD-10-CM

## 2019-12-09 MED ORDER — PANTOPRAZOLE SODIUM 40 MG PO TBEC
40.0000 mg | DELAYED_RELEASE_TABLET | Freq: Two times a day (BID) | ORAL | 3 refills | Status: DC
Start: 2019-12-09 — End: 2020-05-02

## 2019-12-09 NOTE — Patient Instructions (Addendum)
If you are age 65 or older, your body mass index should be between 23-30. Your Body mass index is 29.55 kg/m. If this is out of the aforementioned range listed, please consider follow up with your Primary Care Provider.  If you are age 63 or younger, your body mass index should be between 19-25. Your Body mass index is 29.55 kg/m. If this is out of the aformentioned range listed, please consider follow up with your Primary Care Provider.   Pantoprazole 40 mg twice daily.  Can Continue Sucralfate 1 hour before or 2 hours after eating or other medications.  Will call you back in a week to schedule endoscopy for November.

## 2019-12-09 NOTE — Progress Notes (Addendum)
Chief Complaint: Epigastric discomfort and nausea with a history of gastric ulcers  HPI:    Jesse Lane is a 64 year old Caucasian male with a past medical history as listed below including recent stroke (echo 10/16/2019 with an LVEF 65-70%) and stage IV malignant melanoma with lymph node metastasis, who was referred to me by Marda Stalker, PA-C for a complaint of epigastric discomfort and nausea with a history of gastric ulcers.     08/27/2019 CT the chest abdomen pelvis with contrast which showed no evidence of recurrent metastatic disease in the chest, abdomen or pelvis.  Stomach and bowel were normal.    10/16/2019-10/18/2019 patient admitted to hospital for encephalopathy due to AKI and hypertension as well as strokelike symptoms treated with IV TPA.  He was started on aspirin at discharge.  Apparently at that time discussed a history of gastric ulcers, patient was on Carafate, Prevacid and Prilosec at some point.  It was recommended he follow-up with [place].    Today, the patient tells me that he did not have a stroke, in fact he went for an imaging study and had contrast and then went into kidney failure but presented as a stroke.  Tells me that he is feeling well now.  Describes his GI history at [facility] with a colonoscopy 4 years ago.  He tells me he had polyps but was told to repeat in 10 years.    Patient talks me through his history with melanoma which had spread to his lymph nodes and intestines a couple of years ago.  He was started on Keytruda and with that would have nausea about 1 time a month during treatment, so they thought this was related to the treatment but he has been cancer free now for 2 years and is still having these episodes about 1 time a month where he will have some epigastric "discomfort, I cannot call it pain", as well as nausea.  He also will occasionally vomit depending on what he eats.  This can last for just the morning or can last for a week at a time.  He  was started on Sucralfate 4 times daily which he is taking and feels like it has diminished these episodes.  Also describes a history of being on Prevacid and Pantoprazole in the past but is not taking any of these now.  Vague history of stomach ulcers.    Denies fever, chills, blood in his stool, change in bowel habits, weight loss or symptoms that awaken him from sleep.  Past Medical History:  Diagnosis Date  . Cancer ([place])   . Diabetes mellitus type 2, uncontrolled ([place])   . Diabetic neuropathy ([place])   . History of stomach ulcers   . Hypertension     Past Surgical History:  Procedure Laterality Date  . COLONOSCOPY    . INGUINAL HERNIA REPAIR N/A 08/15/2015   Procedure: HERNIA REPAIR INGUINAL INCARCERATED;  Surgeon: Alphonsa Overall, MD;  Location: [facility] ORS;  Service: General;  Laterality: N/A;  with MESH  . pyloric stenosis    . TONSILLECTOMY      Current Outpatient Medications  Medication Sig Dispense Refill  . acetaminophen (TYLENOL) 500 MG tablet Take 1,000 mg by mouth 2 (two) times daily as needed for mild pain or headache (sleep).     Marland Kitchen aspirin EC 81 MG EC tablet Take 1 tablet (81 mg total) by mouth daily. Swallow whole. 30 tablet 11  . atorvastatin (LIPITOR) 40 MG tablet Take 1 tablet (  40 mg total) by mouth daily. 30 tablet 1  . Continuous Blood Gluc Receiver (FREESTYLE LIBRE 14 DAY READER) DEVI 1 Device by Does not apply route daily as needed. 1 Device 0  . Continuous Blood Gluc Sensor (FREESTYLE LIBRE 14 DAY SENSOR) MISC 1 Device by Does not apply route daily. 3 each 4  . glucose blood (FREESTYLE PRECISION NEO TEST) test strip Used to check blood sugars up to 3 times a day. (Patient taking differently: 1 each by Other route See admin instructions. Used to check blood sugars up to 3 times a day.) 150 each 3  . hydrochlorothiazide (HYDRODIURIL) 25 MG tablet Take 25 mg by mouth at bedtime.    . insulin degludec (TRESIBA FLEXTOUCH) 100 UNIT/ML FlexTouch Pen Inject 24 Units into the skin  daily before breakfast.    . insulin lispro (HUMALOG KWIKPEN) 100 UNIT/ML KwikPen Inject 10-12 Units into the skin See admin instructions. Injection 10-12 units three times daily before meals as needed: for CBG 180-200 10 units, >200 12 units    . Insulin Pen Needle (BD PEN NEEDLE MICRO U/F) 32G X 6 MM MISC 1 each by Other route See admin instructions. Use one pen needle to inject insulin 4 times daily. **must have appointment for additional refills.** 150 each 0  . Insulin Pen Needle (BD PEN NEEDLE MICRO U/F) 32G X 6 MM MISC USE 1 PEN NEEDLE SIX TIMES DAILY (Patient taking differently: 1 each by Other route See admin instructions. USE 1 PEN NEEDLE SIX TIMES DAILY) 200 each 2  . lansoprazole (PREVACID) 15 MG capsule Take 15 mg by mouth at bedtime.    Marland Kitchen lisinopril (PRINIVIL,ZESTRIL) 40 MG tablet Take 40 mg by mouth at bedtime.   0  . Melatonin 10 MG TABS Take 20 mg by mouth at bedtime.    . montelukast (SINGULAIR) 10 MG tablet Take 10 mg by mouth at bedtime.     . Nebivolol HCl (BYSTOLIC) 20 MG TABS Take 20 mg by mouth at bedtime.    Marland Kitchen omeprazole (PRILOSEC) 40 MG capsule Take 1 capsule (40 mg total) by mouth daily with breakfast. (Patient taking differently: Take 40 mg by mouth at bedtime as needed (acid reflux/heartburn). ) 30 capsule 0  . pantoprazole (PROTONIX) 20 MG tablet Take 1 tablet (20 mg total) by mouth 2 (two) times daily before a meal. 60 tablet 0  . pregabalin (LYRICA) 100 MG capsule Take 100-300 mg by mouth See admin instructions. Take one capsule (100 mg) by mouth every morning and afternoon, take three capsules (300 mg) at bedtime    . sucralfate (CARAFATE) 1 g tablet Take 1 g by mouth 4 (four) times daily.    . traZODone (DESYREL) 100 MG tablet Take 100 mg by mouth at bedtime.  0   No current facility-administered medications for this visit.    Allergies as of 12/09/2019 - Review Complete 10/16/2019  Allergen Reaction Noted  . Contrast media [iodinated diagnostic agents] Itching  10/12/2019    Family History  Problem Relation Age of Onset  . Breast cancer Mother   . Bone cancer Mother   . Melanoma Mother   . Prostate cancer Father   . Diabetes Neg Hx     Social History   Socioeconomic History  . Marital status: Single    Spouse name: Not on file  . Number of children: Not on file  . Years of education: Not on file  . Highest education level: Not on file  Occupational History  .  Not on file  Tobacco Use  . Smoking status: Former Smoker    Packs/day: 1.00    Years: 5.00    Pack years: 5.00    Types: Cigarettes  . Smokeless tobacco: Never Used  Vaping Use  . Vaping Use: Never used  Substance and Sexual Activity  . Alcohol use: Yes    Alcohol/week: 1.0 standard drink    Types: 1 Glasses of wine per week    Comment: daily  . Drug use: Yes    Types: Marijuana    Comment: daily  . Sexual activity: Never  Other Topics Concern  . Not on file  Social History Narrative  . Not on file   Social Determinants of Health   Financial Resource Strain:   . Difficulty of Paying Living Expenses: Not on file  Food Insecurity:   . Worried About Charity fundraiser in the Last Year: Not on file  . Ran Out of Food in the Last Year: Not on file  Transportation Needs:   . Lack of Transportation (Medical): Not on file  . Lack of Transportation (Non-Medical): Not on file  Physical Activity:   . Days of Exercise per Week: Not on file  . Minutes of Exercise per Session: Not on file  Stress:   . Feeling of Stress : Not on file  Social Connections:   . Frequency of Communication with Friends and Family: Not on file  . Frequency of Social Gatherings with Friends and Family: Not on file  . Attends Religious Services: Not on file  . Active Member of Clubs or Organizations: Not on file  . Attends Archivist Meetings: Not on file  . Marital Status: Not on file  Intimate Partner Violence:   . Fear of Current or Ex-Partner: Not on file  . Emotionally  Abused: Not on file  . Physically Abused: Not on file  . Sexually Abused: Not on file    Review of Systems:    Constitutional: No weight loss, fever or chills Skin: No rash  Cardiovascular: No chest pain Respiratory: No SOB  Gastrointestinal: See HPI and otherwise negative Genitourinary: No dysuria  Neurological: No headache, dizziness or syncope Musculoskeletal: No new muscle or joint pain Hematologic: No bleeding  Psychiatric: No history of depression or anxiety   Physical Exam:  Vital signs: BP 120/80 (BP Location: Left Arm, Patient Position: Sitting, Cuff Size: Normal)   Pulse 64   Ht 5' 9.69" (1.77 m) Comment: height measured without shoes  Wt 204 lb 2 oz (92.6 kg)   BMI 29.55 kg/m   Constitutional:   Pleasant Caucasian male appears to be in NAD, Well developed, Well nourished, alert and cooperative Head:  Normocephalic and atraumatic. Eyes:   PEERL, EOMI. No icterus. Conjunctiva pink. Ears:  Normal auditory acuity. Neck:  Supple Throat: Oral cavity and pharynx without inflammation, swelling or lesion.  Respiratory: Respirations even and unlabored. Lungs clear to auscultation bilaterally.   No wheezes, crackles, or rhonchi.  Cardiovascular: Normal S1, S2. No MRG. Regular rate and rhythm. No peripheral edema, cyanosis or pallor.  Gastrointestinal:  Soft, nondistended, nontender. No rebound or guarding. Normal bowel sounds. No appreciable masses or hepatomegaly. Rectal:  Not performed.  Msk:  Symmetrical without gross deformities. Without edema, no deformity or joint abnormality.  Neurologic:  Alert and  oriented x4;  grossly normal neurologically.  Skin:   Dry and intact without significant lesions or rashes. Psychiatric: Demonstrates good judgement and reason without abnormal affect or behaviors.  RELEVANT LABS AND IMAGING: CBC    Component Value Date/Time   WBC 4.4 10/18/2019 0339   RBC 3.75 (L) 10/18/2019 0339   HGB 12.0 (L) 10/18/2019 0339   HCT 34.5 (L)  10/18/2019 0339   PLT 173 10/18/2019 0339   MCV 92.0 10/18/2019 0339   MCH 32.0 10/18/2019 0339   MCHC 34.8 10/18/2019 0339   RDW 12.8 10/18/2019 0339   LYMPHSABS 1.7 10/16/2019 1341   MONOABS 0.6 10/16/2019 1341   EOSABS 0.0 10/16/2019 1341   BASOSABS 0.0 10/16/2019 1341    CMP     Component Value Date/Time   NA 137 10/18/2019 0339   K 3.8 10/18/2019 0339   CL 105 10/18/2019 0339   CO2 24 10/18/2019 0339   GLUCOSE 306 (H) 10/18/2019 0339   BUN 17 10/18/2019 0339   CREATININE 1.14 10/18/2019 0339   CALCIUM 8.5 (L) 10/18/2019 0339   PROT 6.9 10/16/2019 1341   ALBUMIN 4.0 10/16/2019 1341   AST 25 10/16/2019 1341   ALT 18 10/16/2019 1341   ALKPHOS 51 10/16/2019 1341   BILITOT 1.9 (H) 10/16/2019 1341   GFRNONAA >60 10/18/2019 0339   GFRAA >60 10/18/2019 0339    Assessment: 1.  Epigastric discomfort: About once a month which can last for a morning or for a week, seems related to foods he is eating accompanied by nausea; consider gastritis/GERD 2.  Nausea 3.  History of gastric ulcers 4.  Recent kidney failure with presentation as a stroke  Plan: 1.  Discussed with patient that he should have an EGD for further evaluation.  Just to be safe we will wait at least 3 months from now due to recent episode and hospitalization.  This will be scheduled with Dr. Ardis Hughs in the [facility].  Did discuss risks, benefits, limitations and alternatives and patient agrees to proceed. 2.  Start the patient on Pantoprazole 40 mg twice daily, 30-60 minutes before breakfast and dinner #60 with 5 refills. 3.  Patient can continue Sucralfate but discussed timing of this taking an hour before 2 hours after other medications. 4.  Patient to follow in clinic for recommendations after EGD with Dr. Ardis Hughs. 5.  We will also attempt to get records from [facility] and colonoscopy to make sure the patient is not due for one now  Ellouise Newer, PA-C [facility] 12/09/2019, 1:59 PM  Cc:  Marda Stalker, PA-C   Addendum: 01/19/2020 8:39 AM  07/27/2015 EGD with gastritis and a 7 mm ulcer in the antrum-pathology with ulcer showing chronic gastritis with focal intestinal metaplasia and erosion-they recommended to repeat EGD in 2 months.  Also biopsies from 2 cm above the GE junction showing Barrett's esophagus.  07/27/2015 colonoscopy results with 3 polyps, the first 5 cm and pedunculated, the second 12 mm in the third 10 mm.  It was recommended patient have a repeat colon in 3 months with double prep to evaluate the polypectomy site-pathology showed tubulovillous adenoma at 30 cm of colon and the other 2 were hyperplastic.  Repeat again was recommended in 3 months 04/2015  It appears patient is due for his colonoscopy with a history of adenomatous polyps.  We will reach out to him and see if it is possible to add this on at time of his EGD, otherwise we will need to schedule it afterwards.  Ellouise Newer, PA-C

## 2019-12-10 ENCOUNTER — Ambulatory Visit: Payer: Medicare Other | Admitting: Cardiology

## 2019-12-10 NOTE — Progress Notes (Signed)
I agree with the above note, plan 

## 2020-01-12 ENCOUNTER — Telehealth: Payer: Self-pay

## 2020-01-12 NOTE — Telephone Encounter (Signed)
Referral from [facility] Dr. Ted Mcalpine Phone [phone] Fax [phone] Sent to Scheduling Notes in Chart Prep Ri

## 2020-01-14 ENCOUNTER — Other Ambulatory Visit: Payer: Self-pay

## 2020-01-14 NOTE — Patient Outreach (Signed)
[facility]) Care Management  01/14/2020  Jesse Lane [birth year] [id number]  Telephone outreach to patient to obtain mRS was successfully completed. MRS=0  [place] Management Assistant [phone]

## 2020-01-19 ENCOUNTER — Telehealth: Payer: Self-pay

## 2020-01-19 NOTE — Telephone Encounter (Signed)
-----   Message from Levin Erp, [place] sent at 01/19/2020  8:44 AM EDT ----- Regarding: Colonoscopy I just got this patient's records, it appears he is overdue for a colonoscopy.  His last was in 2017 with multiple polyps and actually recommended repeating in 3 months, he is past this now.  Please ask him if he would like for [place] to schedule EGD and colonoscopy at the same time, otherwise we will have to schedule this separately.  Thanks, JLL

## 2020-01-19 NOTE — Telephone Encounter (Signed)
The pt has an appt on 10/19 with Anderson Malta and will discuss at that time.

## 2020-01-21 ENCOUNTER — Telehealth: Payer: Self-pay | Admitting: Physician Assistant

## 2020-01-25 NOTE — Telephone Encounter (Signed)
I spoke with patient and apologized I did not call him back sooner. Scheduled patient for ECL on 02/15/20 @ 3 pm. with Dr. Ardis Hughs and a previsit appointment on 10/27 @ 1 pm.

## 2020-01-26 ENCOUNTER — Ambulatory Visit: Payer: Medicare Other | Admitting: Physician Assistant

## 2020-02-03 ENCOUNTER — Ambulatory Visit (AMBULATORY_SURGERY_CENTER): Payer: Self-pay | Admitting: *Deleted

## 2020-02-03 ENCOUNTER — Other Ambulatory Visit: Payer: Self-pay

## 2020-02-03 VITALS — Ht 69.5 in | Wt 204.0 lb

## 2020-02-03 DIAGNOSIS — Z86010 Personal history of colonic polyps: Secondary | ICD-10-CM

## 2020-02-03 DIAGNOSIS — R1013 Epigastric pain: Secondary | ICD-10-CM

## 2020-02-03 NOTE — Progress Notes (Signed)

## 2020-02-14 ENCOUNTER — Encounter: Payer: Self-pay | Admitting: Certified Registered Nurse Anesthetist

## 2020-02-15 ENCOUNTER — Encounter: Payer: Self-pay | Admitting: Gastroenterology

## 2020-02-15 ENCOUNTER — Ambulatory Visit (AMBULATORY_SURGERY_CENTER): Payer: Medicare Other | Admitting: Gastroenterology

## 2020-02-15 ENCOUNTER — Other Ambulatory Visit: Payer: Self-pay

## 2020-02-15 VITALS — BP 175/88 | HR 70 | Temp 98.0°F | Resp 19 | Ht 69.5 in | Wt 204.0 lb

## 2020-02-15 DIAGNOSIS — K2270 Barrett's esophagus without dysplasia: Secondary | ICD-10-CM | POA: Diagnosis not present

## 2020-02-15 DIAGNOSIS — R1013 Epigastric pain: Secondary | ICD-10-CM

## 2020-02-15 DIAGNOSIS — D125 Benign neoplasm of sigmoid colon: Secondary | ICD-10-CM

## 2020-02-15 DIAGNOSIS — D123 Benign neoplasm of transverse colon: Secondary | ICD-10-CM

## 2020-02-15 DIAGNOSIS — K319 Disease of stomach and duodenum, unspecified: Secondary | ICD-10-CM | POA: Diagnosis not present

## 2020-02-15 DIAGNOSIS — D122 Benign neoplasm of ascending colon: Secondary | ICD-10-CM | POA: Diagnosis not present

## 2020-02-15 DIAGNOSIS — K2970 Gastritis, unspecified, without bleeding: Secondary | ICD-10-CM

## 2020-02-15 DIAGNOSIS — K2950 Unspecified chronic gastritis without bleeding: Secondary | ICD-10-CM

## 2020-02-15 DIAGNOSIS — Z86010 Personal history of colonic polyps: Secondary | ICD-10-CM

## 2020-02-15 MED ORDER — SODIUM CHLORIDE 0.9 % IV SOLN: 500.0000 mL | Freq: Once | INTRAVENOUS | Status: DC

## 2020-02-15 NOTE — Op Note (Signed)
[facility] Patient Name: Jesse Lane Procedure Date: 02/15/2020 2:34 PM MRN: [id number] Endoscopist: Milus Banister , MD Age: 64 Referring MD:  Date of Birth: 07-23-1955 Gender: Male Account #: [id number] Procedure:                Colonoscopy Indications:              High risk colon cancer surveillance: Personal                            history of colonic polyps; Colonoscopy 2017 Dr.                            Ferdinand Lango in [place]: he documented that the prep was poor,                            he removed 3 polyps one of which was "pedunculated                            24mc by 5cm" and was TVA on pathology Medicines:                Monitored Anesthesia Care Procedure:                Pre-Anesthesia Assessment:                           - Prior to the procedure, a History and Physical                            was performed, and patient medications and                            allergies were reviewed. The patient's tolerance of                            previous anesthesia was also reviewed. The risks                            and benefits of the procedure and the sedation                            options and risks were discussed with the patient.                            All questions were answered, and informed consent                            was obtained. Prior Anticoagulants: The patient has                            taken no previous anticoagulant or antiplatelet                            agents. ASA Grade Assessment: II - A patient with  mild systemic disease. After reviewing the risks                            and benefits, the patient was deemed in                            satisfactory condition to undergo the procedure.                           After obtaining informed consent, the colonoscope                            was passed under direct vision. Throughout the                            procedure, the patient's blood  pressure, pulse, and                            oxygen saturations were monitored continuously. The                            Colonoscope was introduced through the anus and                            advanced to the the cecum, identified by                            appendiceal orifice and ileocecal valve. The                            colonoscopy was performed without difficulty. The                            patient tolerated the procedure well. The quality                            of the bowel preparation was good. The ileocecal                            valve, appendiceal orifice, and rectum were                            photographed. Scope In: 2:48:13 PM Scope Out: 3:01:55 PM Scope Withdrawal Time: 0 hours 11 minutes 6 seconds  Total Procedure Duration: 0 hours 13 minutes 42 seconds  Findings:                 Multiple small and large-mouthed diverticula were                            found in the entire colon.                           Five sessile polyps were found in the sigmoid  colon, transverse colon and ascending colon. The                            polyps were 2 to 6 mm in size. These polyps were                            removed with a cold snare. Resection and retrieval                            were complete.                           The exam was otherwise without abnormality on                            direct and retroflexion views. Complications:            No immediate complications. Estimated blood loss:                            None. Estimated Blood Loss:     Estimated blood loss: none. Impression:               - Diverticulosis in the entire examined colon.                           - Five 2 to 6 mm polyps in the sigmoid colon, in                            the transverse colon and in the ascending colon,                            removed with a cold snare. Resected and retrieved.                           - The  examination was otherwise normal on direct                            and retroflexion views. Recommendation:           - EGD now.                           - Await pathology results. Milus Banister, MD 02/15/2020 3:11:00 PM This report has been signed electronically.

## 2020-02-15 NOTE — Op Note (Signed)
[facility] Patient Name: Jesse Lane Procedure Date: 02/15/2020 2:34 PM MRN: [id number] Endoscopist: Milus Banister , MD Age: 64 Referring MD:  Date of Birth: 1956/02/11 Gender: Male Account #: [id number] Procedure:                Upper GI endoscopy Indications:              Epigastric abdominal pain; EGD Dr. Ferdinand Lango in [facility]                            2017, very poorly documented procedure; he noted                            "esophagitis, 4cm" as well as "gastriritis" and                            "ulcer, antrum." Twelve pathology jars were used at                            12 different sites. He recommended repeat EGD in 2                            months. Medicines:                Monitored Anesthesia Care Procedure:                Pre-Anesthesia Assessment:                           - Prior to the procedure, a History and Physical                            was performed, and patient medications and                            allergies were reviewed. The patient's tolerance of                            previous anesthesia was also reviewed. The risks                            and benefits of the procedure and the sedation                            options and risks were discussed with the patient.                            All questions were answered, and informed consent                            was obtained. Prior Anticoagulants: The patient has                            taken no previous anticoagulant or antiplatelet  agents. ASA Grade Assessment: II - A patient with                            mild systemic disease. After reviewing the risks                            and benefits, the patient was deemed in                            satisfactory condition to undergo the procedure.                           After obtaining informed consent, the endoscope was                            passed under direct vision. Throughout the                             procedure, the patient's blood pressure, pulse, and                            oxygen saturations were monitored continuously. The                            Endoscope was introduced through the mouth, and                            advanced to the second part of duodenum. The upper                            GI endoscopy was accomplished without difficulty.                            The patient tolerated the procedure well. Scope In: Scope Out: Findings:                 There were esophageal mucosal changes suspicious                            for short-segment, non-nodular Barrett's esophagus,                            classified as Barrett's stage C1-M2 per [place]                            criteria. The maximum longitudinal extent of these                            mucosal changes was 2 cm in length. Mucosa was                            biopsied with a cold forceps for histology in 4  quadrants at intervals of 1.5 cm in the lower third                            of the esophagus. One specimen bottle was sent to                            pathology.                           Diffuse mild inflammation characterized by                            erythema, friability and granularity was found in                            the gastric antrum. Biopsies were taken with a cold                            forceps for histology.                           The exam was otherwise without abnormality. Complications:            No immediate complications. Estimated blood loss:                            None. Estimated Blood Loss:     Estimated blood loss: none. Impression:               - Short segment, non-nodular Barrett's change,                            classified as Barrett's stage C1-M2 per [place]                            criteria. Biopsied.                           - Gastritis. Biopsied.                           - The examination  was otherwise normal. Recommendation:           - Patient has a contact number available for                            emergencies. The signs and symptoms of potential                            delayed complications were discussed with the                            patient. Return to normal activities tomorrow.                            Written discharge instructions were provided to the  patient.                           - Resume previous diet.                           - Continue present medications.                           - Await pathology results. Milus Banister, MD 02/15/2020 3:16:21 PM This report has been signed electronically.

## 2020-02-15 NOTE — Progress Notes (Signed)
BP  186/94, Labetalol 5 mg given IV, MD update, vss

## 2020-02-15 NOTE — Progress Notes (Signed)
1441 Robinul 0.1 mg IV given due large amount of secretions upon assessment.  MD made aware, vss  °

## 2020-02-15 NOTE — Progress Notes (Signed)
Called to room to assist during endoscopic procedure.  Patient ID and intended procedure confirmed with present staff. Received instructions for my participation in the procedure from the performing physician.  

## 2020-02-15 NOTE — Progress Notes (Signed)
Reviewed medical history with no changes noted. VS assessed by C.W

## 2020-02-15 NOTE — Patient Instructions (Signed)
Handouts provided on Barrett's esophagus, gastritis, polyps and diverticulosis.   YOU HAD AN ENDOSCOPIC PROCEDURE TODAY AT [facility]:   Refer to the procedure report that was given to you for any specific questions about what was found during the examination.  If the procedure report does not answer your questions, please call your gastroenterologist to clarify.  If you requested that your care partner not be given the details of your procedure findings, then the procedure report has been included in a sealed envelope for you to review at your convenience later.  YOU SHOULD EXPECT: Some feelings of bloating in the abdomen. Passage of more gas than usual.  Walking can help get rid of the air that was put into your GI tract during the procedure and reduce the bloating. If you had a lower endoscopy (such as a colonoscopy or flexible sigmoidoscopy) you may notice spotting of blood in your stool or on the toilet paper. If you underwent a bowel prep for your procedure, you may not have a normal bowel movement for a few days.  Please Note:  You might notice some irritation and congestion in your nose or some drainage.  This is from the oxygen used during your procedure.  There is no need for concern and it should clear up in a day or so.  SYMPTOMS TO REPORT IMMEDIATELY:   Following lower endoscopy (colonoscopy or flexible sigmoidoscopy):  Excessive amounts of blood in the stool  Significant tenderness or worsening of abdominal pains  Swelling of the abdomen that is new, acute  Fever of 100F or higher   Following upper endoscopy (EGD)  Vomiting of blood or coffee ground material  New chest pain or pain under the shoulder blades  Painful or persistently difficult swallowing  New shortness of breath  Fever of 100F or higher  Black, tarry-looking stools  For urgent or emergent issues, a gastroenterologist can be reached at any hour by calling [phone]. Do not use MyChart  messaging for urgent concerns.    DIET:  We do recommend a small meal at first, but then you may proceed to your regular diet.  Drink plenty of fluids but you should avoid alcoholic beverages for 24 hours.  ACTIVITY:  You should plan to take it easy for the rest of today and you should NOT DRIVE or use heavy machinery until tomorrow (because of the sedation medicines used during the test).    FOLLOW UP: Our staff will call the number listed on your records 48-72 hours following your procedure to check on you and address any questions or concerns that you may have regarding the information given to you following your procedure. If we do not reach you, we will leave a message.  We will attempt to reach you two times.  During this call, we will ask if you have developed any symptoms of COVID 19. If you develop any symptoms (ie: fever, flu-like symptoms, shortness of breath, cough etc.) before then, please call [phone].  If you test positive for Covid 19 in the 2 weeks post procedure, please call and report this information to [place].    If any biopsies were taken you will be contacted by phone or by letter within the next 1-3 weeks.  Please call us at [phone] if you have not heard about the biopsies in 3 weeks.    SIGNATURES/CONFIDENTIALITY: You and/or your care partner have signed paperwork which will be entered into your electronic medical record.  These  signatures attest to the fact that that the information above on your After Visit Summary has been reviewed and is understood.  Full responsibility of the confidentiality of this discharge information lies with you and/or your care-partner.

## 2020-02-15 NOTE — Progress Notes (Signed)
Report given to PACU, vss 

## 2020-02-17 ENCOUNTER — Telehealth: Payer: Self-pay | Admitting: *Deleted

## 2020-02-17 NOTE — Telephone Encounter (Signed)
  Follow up Call-  Call back number 02/15/2020  Post procedure Call Back phone  # [phone]  Permission to leave phone message Yes  Some recent data might be hidden     Patient questions:  Do you have a fever, pain , or abdominal swelling? No. Pain Score  0 *  Have you tolerated food without any problems? Yes.    Have you been able to return to your normal activities? Yes.    Do you have any questions about your discharge instructions: Diet   No. Medications  No. Follow up visit  No.  Do you have questions or concerns about your Care? No.  Actions: * If pain score is 4 or above: 1. No action needed, pain <4.Have you developed a fever since your procedure? no  2.   Have you had an respiratory symptoms (SOB or cough) since your procedure? no  3.   Have you tested positive for COVID 19 since your procedure no  4.   Have you had any family members/close contacts diagnosed with the COVID 19 since your procedure?  no   If yes to any of these questions please route to Joylene John, RN and Joella Prince, RN

## 2020-02-17 NOTE — Telephone Encounter (Signed)
Attempted f/u phone call. No answer. Left message. °

## 2020-02-23 ENCOUNTER — Encounter: Payer: Self-pay | Admitting: Gastroenterology

## 2020-02-29 ENCOUNTER — Encounter (INDEPENDENT_AMBULATORY_CARE_PROVIDER_SITE_OTHER): Payer: Medicare Other | Admitting: Ophthalmology

## 2020-02-29 ENCOUNTER — Other Ambulatory Visit: Payer: Self-pay

## 2020-02-29 DIAGNOSIS — E11311 Type 2 diabetes mellitus with unspecified diabetic retinopathy with macular edema: Secondary | ICD-10-CM | POA: Diagnosis not present

## 2020-02-29 DIAGNOSIS — E113313 Type 2 diabetes mellitus with moderate nonproliferative diabetic retinopathy with macular edema, bilateral: Secondary | ICD-10-CM | POA: Diagnosis not present

## 2020-02-29 DIAGNOSIS — H35033 Hypertensive retinopathy, bilateral: Secondary | ICD-10-CM

## 2020-02-29 DIAGNOSIS — H43813 Vitreous degeneration, bilateral: Secondary | ICD-10-CM

## 2020-02-29 DIAGNOSIS — I10 Essential (primary) hypertension: Secondary | ICD-10-CM | POA: Diagnosis not present

## 2020-03-28 ENCOUNTER — Encounter (INDEPENDENT_AMBULATORY_CARE_PROVIDER_SITE_OTHER): Payer: Medicare Other | Admitting: Ophthalmology

## 2020-03-28 ENCOUNTER — Other Ambulatory Visit: Payer: Self-pay

## 2020-03-28 DIAGNOSIS — I10 Essential (primary) hypertension: Secondary | ICD-10-CM

## 2020-03-28 DIAGNOSIS — E113513 Type 2 diabetes mellitus with proliferative diabetic retinopathy with macular edema, bilateral: Secondary | ICD-10-CM

## 2020-03-28 DIAGNOSIS — H35033 Hypertensive retinopathy, bilateral: Secondary | ICD-10-CM

## 2020-03-28 DIAGNOSIS — H43813 Vitreous degeneration, bilateral: Secondary | ICD-10-CM

## 2020-04-29 ENCOUNTER — Encounter (INDEPENDENT_AMBULATORY_CARE_PROVIDER_SITE_OTHER): Payer: Medicare Other | Admitting: Ophthalmology

## 2020-05-01 ENCOUNTER — Other Ambulatory Visit: Payer: Self-pay | Admitting: Physician Assistant

## 2020-05-04 ENCOUNTER — Other Ambulatory Visit: Payer: Self-pay | Admitting: Physician Assistant

## 2020-05-04 ENCOUNTER — Other Ambulatory Visit: Payer: Self-pay

## 2020-05-16 ENCOUNTER — Other Ambulatory Visit: Payer: Self-pay

## 2020-05-16 ENCOUNTER — Encounter (INDEPENDENT_AMBULATORY_CARE_PROVIDER_SITE_OTHER): Payer: Medicare Other | Admitting: Ophthalmology

## 2020-05-16 DIAGNOSIS — H43813 Vitreous degeneration, bilateral: Secondary | ICD-10-CM | POA: Diagnosis not present

## 2020-05-16 DIAGNOSIS — E113513 Type 2 diabetes mellitus with proliferative diabetic retinopathy with macular edema, bilateral: Secondary | ICD-10-CM | POA: Diagnosis not present

## 2020-05-16 DIAGNOSIS — I10 Essential (primary) hypertension: Secondary | ICD-10-CM

## 2020-05-16 DIAGNOSIS — H35033 Hypertensive retinopathy, bilateral: Secondary | ICD-10-CM

## 2020-06-07 ENCOUNTER — Ambulatory Visit (INDEPENDENT_AMBULATORY_CARE_PROVIDER_SITE_OTHER): Payer: Medicare Other | Admitting: Physician Assistant

## 2020-06-07 ENCOUNTER — Encounter: Payer: Self-pay | Admitting: Physician Assistant

## 2020-06-07 VITALS — BP 142/66 | HR 70 | Ht 71.0 in | Wt 208.0 lb

## 2020-06-07 DIAGNOSIS — R140 Abdominal distension (gaseous): Secondary | ICD-10-CM

## 2020-06-07 DIAGNOSIS — R6881 Early satiety: Secondary | ICD-10-CM

## 2020-06-07 DIAGNOSIS — R5383 Other fatigue: Secondary | ICD-10-CM

## 2020-06-07 DIAGNOSIS — R1013 Epigastric pain: Secondary | ICD-10-CM

## 2020-06-07 DIAGNOSIS — K219 Gastro-esophageal reflux disease without esophagitis: Secondary | ICD-10-CM

## 2020-06-07 NOTE — Progress Notes (Signed)
I agree with the above note, plan 

## 2020-06-07 NOTE — Patient Instructions (Addendum)
If you are age 66 or older, your body mass index should be between 23-30. Your Body mass index is 29.01 kg/m. If this is out of the aforementioned range listed, please consider follow up with your Primary Care Provider.  If you are age 35 or younger, your body mass index should be between 19-25. Your Body mass index is 29.01 kg/m. If this is out of the aformentioned range listed, please consider follow up with your Primary Care Provider.   You will be contacted by [facility] in the next 2 days to arrange a gastric emptying scan.  The number on your caller ID will be [phone], please answer when they call.  If you have not heard from them in 2 days please call [phone] to schedule.    Due to recent changes in healthcare laws, you may see the results of your imaging and laboratory studies on MyChart before your provider has had a chance to review them.  We understand that in some cases there may be results that are confusing or concerning to you. Not all laboratory results come back in the same time frame and the provider may be waiting for multiple results in order to interpret others.  Please give [place] 48 hours in order for your provider to thoroughly review all the results before contacting the office for clarification of your results.   Thank you for choosing me and [facility].  Ellouise Newer, PA-C

## 2020-06-07 NOTE — Progress Notes (Signed)
Chief Complaint: Bloated  HPI:    Jesse Lane is a 65 year old male with a past medical history of diabetes, stroke 10/16/2019 with LVEF 65-70% on echo, stage IV malignant myeloma with lymph node metastasis and others listed below, who was referred to me by [name], MD for a complaint of bloating.       08/27/2019 CT the chest abdomen pelvis with contrast which showed no evidence of recurrent metastatic disease in the chest, abdomen or pelvis.  Stomach and bowel were normal.    10/16/2019-10/18/2019 patient admitted to hospital for encephalopathy due to AKI and hypertension as well as strokelike symptoms treated with IV TPA.  He was started on aspirin at discharge.  Apparently at that time discussed a history of gastric ulcers, patient was on Carafate, Prevacid and Prilosec at some point.  It was recommended he follow-up with [place].    10/17/2019 hemoglobin A1c 6.4.    12/09/2019 patient seen in clinic by me for epigastric discomfort and nausea with a history of gastric ulcers.  At that time patient discussed that he had not had a stroke recently and just presented as one after kidney failure from IV contrast.  At that time was cancer free for 2 years but continued with some nausea and episodes of vomiting.  He was scheduled for an EGD and started on pantoprazole 40 twice daily.  Also continued sucralfate.    02/15/2020 colonoscopy with diverticulosis in the entire colon, 5 2-6 mm polyps in sigmoid, transverse and descending colon and otherwise normal.  Pathology showed adenomatous polyps and repeat was recommended in 5 years.    02/15/2020 EGD with short segment, nonnodular Barrett's change classified as Barrett's stage C1-M2, gastritis and otherwise normal.  Repeat was recommended in 3 years.    Today, the patient presents to clinic and explains that for the past couple of years he has had these episodes where for about a week out of the month he will have no energy, no appetite and feel very full very  fast as well as bloated.  This pretty much "takes me out".  Tells me he really cannot function during these times.  Describes that he does not think he becomes nauseous because he is still using his Carafate and Pantoprazole as directed.  Denies any reflux symptoms.  Explains that his PCP thought possibly he had gastroparesis.    Denies fever, chills, weight loss, change in bowel habits or blood in his stool.  Past Medical History:  Diagnosis Date  . Arthritis   . Diabetes mellitus type 2, uncontrolled ([place])   . Diabetic neuropathy ([place])   . GERD (gastroesophageal reflux disease)   . History of stomach ulcers   . Hypertension   . Melanoma ([place])    mets to lymph node and intestines    Past Surgical History:  Procedure Laterality Date  . ABCESS DRAINAGE     gluteal area  . COLONOSCOPY    . INGUINAL HERNIA REPAIR N/A 08/15/2015   Procedure: HERNIA REPAIR INGUINAL INCARCERATED;  Surgeon: Alphonsa Overall, MD;  Location: [facility];  Service: General;  Laterality: N/A;  with MESH  . pyloric stenosis    . TONSILLECTOMY      Current Outpatient Medications  Medication Sig Dispense Refill  . acetaminophen (TYLENOL) 500 MG tablet Take 1,000 mg by mouth 2 (two) times daily as needed for mild pain or headache (sleep).     Marland Kitchen amLODipine (NORVASC) 10 MG tablet Take 10 mg by mouth daily.    Marland Kitchen  aspirin EC 81 MG EC tablet Take 1 tablet (81 mg total) by mouth daily. Swallow whole. 30 tablet 11  . atorvastatin (LIPITOR) 40 MG tablet Take 1 tablet (40 mg total) by mouth daily. 30 tablet 1  . Continuous Blood Gluc Receiver ([place]) DEVI by Does not apply route.    . Continuous Blood Gluc Sensor (DEXCOM G6 SENSOR) MISC by Does not apply route.    . Continuous Blood Gluc Transmit (DEXCOM G6 TRANSMITTER) MISC by Does not apply route.    . hydrochlorothiazide (HYDRODIURIL) 25 MG tablet Take 25 mg by mouth at bedtime.    . insulin degludec (TRESIBA FLEXTOUCH) 100 UNIT/ML FlexTouch Pen Inject 24 Units into  the skin daily before breakfast.    . insulin lispro (HUMALOG KWIKPEN) 100 UNIT/ML KwikPen Inject 10-12 Units into the skin See admin instructions. Injection 10-12 units three times daily before meals as needed: for CBG 180-200 10 units, >200 12 units    . Insulin Pen Needle (BD PEN NEEDLE MICRO U/F) 32G X 6 MM MISC 1 each by Other route See admin instructions. Use one pen needle to inject insulin 4 times daily. **must have appointment for additional refills.** 150 each 0  . Insulin Pen Needle (BD PEN NEEDLE MICRO U/F) 32G X 6 MM MISC USE 1 PEN NEEDLE SIX TIMES DAILY (Patient taking differently: 1 each by Other route See admin instructions. USE 1 PEN NEEDLE SIX TIMES DAILY) 200 each 2  . lisinopril (PRINIVIL,ZESTRIL) 40 MG tablet Take 40 mg by mouth at bedtime.   0  . Melatonin 10 MG TABS Take 20 mg by mouth at bedtime.    . montelukast (SINGULAIR) 10 MG tablet Take 10 mg by mouth at bedtime.     . Nebivolol HCl (BYSTOLIC) 20 MG TABS Take 20 mg by mouth at bedtime.    . pantoprazole (PROTONIX) 40 MG tablet Take 1 tablet by mouth twice daily 60 tablet 5  . pregabalin (LYRICA) 100 MG capsule Take 100-300 mg by mouth See admin instructions. Take one capsule (100 mg) by mouth every morning and afternoon, take three capsules (300 mg) at bedtime    . sucralfate (CARAFATE) 1 g tablet Take 1 g by mouth 4 (four) times daily.    . traZODone (DESYREL) 100 MG tablet Take 100 mg by mouth at bedtime.  0   No current facility-administered medications for this visit.    Allergies as of 06/07/2020 - Review Complete 02/15/2020  Allergen Reaction Noted  . Contrast media [iodinated diagnostic agents] Itching 10/12/2019    Family History  Problem Relation Age of Onset  . Breast cancer Mother   . Bone cancer Mother   . Melanoma Mother   . Prostate cancer Father   . Pancreatic cancer Maternal Grandfather   . Diabetes Paternal Aunt   . Colon cancer Neg Hx   . Colon polyps Neg Hx   . Esophageal cancer Neg Hx    . Stomach cancer Neg Hx     Social History   Socioeconomic History  . Marital status: Single    Spouse name: Not on file  . Number of children: 1  . Years of education: Not on file  . Highest education level: Not on file  Occupational History  . Occupation: retired  Tobacco Use  . Smoking status: Former Smoker    Packs/day: 1.00    Years: 5.00    Pack years: 5.00    Types: Cigarettes  . Smokeless tobacco: Never Used  Vaping Use  . Vaping Use: Never used  Substance and Sexual Activity  . Alcohol use: Yes    Alcohol/week: 2.0 standard drinks    Types: 2 Glasses of wine per week    Comment: 375 ml of red wine daily  . Drug use: Not Currently    Types: Marijuana    Comment: daily  . Sexual activity: Never  Other Topics Concern  . Not on file  Social History Narrative  . Not on file   Social Determinants of Health   Financial Resource Strain: Not on file  Food Insecurity: Not on file  Transportation Needs: Not on file  Physical Activity: Not on file  Stress: Not on file  Social Connections: Not on file  Intimate Partner Violence: Not on file    Review of Systems:    Constitutional: No weight loss, fever or chills Cardiovascular: No chest pain  Respiratory: No SOB  Gastrointestinal: See HPI and otherwise negative   Physical Exam:  Vital signs: BP (!) 142/66   Pulse 70   Ht 5\' 11"  (1.803 m)   Wt 208 lb (94.3 kg)   BMI 29.01 kg/m   Constitutional:   Pleasant Caucasian male appears to be in NAD, Well developed, Well nourished, alert and cooperative Respiratory: Respirations even and unlabored. Lungs clear to auscultation bilaterally.   No wheezes, crackles, or rhonchi.  Cardiovascular: Normal S1, S2. No MRG. Regular rate and rhythm. No peripheral edema, cyanosis or pallor.  Gastrointestinal:  Soft, nondistended, nontender. No rebound or guarding. Normal bowel sounds. No appreciable masses or hepatomegaly. Rectal:  Not performed.  Psychiatric: Demonstrates  good judgement and reason without abnormal affect or behaviors.  RELEVANT LABS AND IMAGING: CBC    Component Value Date/Time   WBC 4.4 10/18/2019 0339   RBC 3.75 (L) 10/18/2019 0339   HGB 12.0 (L) 10/18/2019 0339   HCT 34.5 (L) 10/18/2019 0339   PLT 173 10/18/2019 0339   MCV 92.0 10/18/2019 0339   MCH 32.0 10/18/2019 0339   MCHC 34.8 10/18/2019 0339   RDW 12.8 10/18/2019 0339   LYMPHSABS 1.7 10/16/2019 1341   MONOABS 0.6 10/16/2019 1341   EOSABS 0.0 10/16/2019 1341   BASOSABS 0.0 10/16/2019 1341    CMP     Component Value Date/Time   NA 137 10/18/2019 0339   K 3.8 10/18/2019 0339   CL 105 10/18/2019 0339   CO2 24 10/18/2019 0339   GLUCOSE 306 (H) 10/18/2019 0339   BUN 17 10/18/2019 0339   CREATININE 1.14 10/18/2019 0339   CALCIUM 8.5 (L) 10/18/2019 0339   PROT 6.9 10/16/2019 1341   ALBUMIN 4.0 10/16/2019 1341   AST 25 10/16/2019 1341   ALT 18 10/16/2019 1341   ALKPHOS 51 10/16/2019 1341   BILITOT 1.9 (H) 10/16/2019 1341   GFRNONAA >60 10/18/2019 0339   GFRAA >60 10/18/2019 0339    Assessment: 1.  Early satiety: For about 1 week out of the month feels very fatigued, bloated and has early satiety with minimal appetite, just "lays around", recent EGD last year with Barrett's esophagus and gastritis; consider gastroparesis versus SIBO versus other 2.  Epigastric discomfort 3.  Fatigue: Very fatigued for 1 week out of the month with symptoms as above 4.  Bloating 5.  GERD: Controlled on Pantoprazole and Carafate  Plan: 1.  Discussed that there is a vague possibility that this is gastroparesis, though his symptoms would be slightly atypical for this.  Ordered a gastric emptying study for further evaluation. 2.  Patient continues Pantoprazole and Sucralfate for now. 3.  If gastric emptying study is normal could consider SIBO testing 4.  Patient to follow in clinic per recommendations after imaging above.  Jesse Newer, PA-C [facility] 06/07/2020, 10:27  AM  Cc: [name], MD

## 2020-06-14 ENCOUNTER — Other Ambulatory Visit: Payer: Self-pay

## 2020-06-14 ENCOUNTER — Encounter (INDEPENDENT_AMBULATORY_CARE_PROVIDER_SITE_OTHER): Payer: Medicare Other | Admitting: Ophthalmology

## 2020-06-14 DIAGNOSIS — H35033 Hypertensive retinopathy, bilateral: Secondary | ICD-10-CM

## 2020-06-14 DIAGNOSIS — I10 Essential (primary) hypertension: Secondary | ICD-10-CM

## 2020-06-14 DIAGNOSIS — E113513 Type 2 diabetes mellitus with proliferative diabetic retinopathy with macular edema, bilateral: Secondary | ICD-10-CM

## 2020-06-14 DIAGNOSIS — H43813 Vitreous degeneration, bilateral: Secondary | ICD-10-CM | POA: Diagnosis not present

## 2020-06-27 ENCOUNTER — Encounter (INDEPENDENT_AMBULATORY_CARE_PROVIDER_SITE_OTHER): Payer: Medicare Other | Admitting: Ophthalmology

## 2020-06-27 ENCOUNTER — Other Ambulatory Visit: Payer: Self-pay

## 2020-06-27 DIAGNOSIS — E113511 Type 2 diabetes mellitus with proliferative diabetic retinopathy with macular edema, right eye: Secondary | ICD-10-CM | POA: Diagnosis not present

## 2020-07-01 ENCOUNTER — Ambulatory Visit (HOSPITAL_COMMUNITY)
Admission: RE | Admit: 2020-07-01 | Discharge: 2020-07-01 | Disposition: A | Discharge status: Home / Self Care | Payer: Medicare Other | Source: Ambulatory Visit | Attending: Physician Assistant | Admitting: Physician Assistant

## 2020-07-01 ENCOUNTER — Other Ambulatory Visit: Payer: Self-pay

## 2020-07-01 DIAGNOSIS — R140 Abdominal distension (gaseous): Secondary | ICD-10-CM | POA: Diagnosis not present

## 2020-07-01 DIAGNOSIS — R1013 Epigastric pain: Secondary | ICD-10-CM

## 2020-07-01 DIAGNOSIS — R5383 Other fatigue: Secondary | ICD-10-CM | POA: Diagnosis present

## 2020-07-01 DIAGNOSIS — R6881 Early satiety: Secondary | ICD-10-CM | POA: Diagnosis present

## 2020-07-01 MED ORDER — TECHNETIUM TC 99M SULFUR COLLOID
2.0000 | Freq: Once | INTRAVENOUS | Status: AC | PRN
  Administered 2020-07-01: 2 via INTRAVENOUS

## 2020-07-04 ENCOUNTER — Other Ambulatory Visit: Payer: Self-pay

## 2020-07-04 MED ORDER — METOCLOPRAMIDE HCL 5 MG PO TABS: ORAL_TABLET | ORAL | 0 refills | Status: DC

## 2020-07-12 ENCOUNTER — Encounter (INDEPENDENT_AMBULATORY_CARE_PROVIDER_SITE_OTHER): Payer: Medicare Other | Admitting: Ophthalmology

## 2020-07-18 ENCOUNTER — Encounter (INDEPENDENT_AMBULATORY_CARE_PROVIDER_SITE_OTHER): Payer: Medicare Other | Admitting: Ophthalmology

## 2020-07-26 ENCOUNTER — Encounter: Payer: Self-pay | Admitting: Physician Assistant

## 2020-07-26 ENCOUNTER — Ambulatory Visit (INDEPENDENT_AMBULATORY_CARE_PROVIDER_SITE_OTHER): Payer: Medicare Other | Admitting: Physician Assistant

## 2020-07-26 VITALS — BP 126/94 | HR 74 | Ht 71.0 in | Wt 213.1 lb

## 2020-07-26 DIAGNOSIS — K3184 Gastroparesis: Secondary | ICD-10-CM | POA: Diagnosis not present

## 2020-07-26 MED ORDER — METOCLOPRAMIDE HCL 5 MG PO TABS: ORAL_TABLET | ORAL | 3 refills | Status: DC

## 2020-07-26 NOTE — Progress Notes (Signed)
Chief Complaint: Follow-up gastroparesis  HPI:    Jesse Lane is a 65 year old Caucasian male with a past medical history as listed below including stroke 10/16/2019 with LVEF 65-70%, stage IV malignant myeloma with lymph node metastasis, known to Dr. Ardis Hughs, who was referred to me by Jesse Rasmussen, MD for follow-up of gastroparesis   08/27/2019 CT the chest abdomen pelvis with contrast which showed no evidence of recurrent metastatic disease in the chest, abdomen or pelvis.  Stomach and bowel were normal.    10/16/2019-10/18/2019 patient admitted to hospital for encephalopathy due to AKI and hypertension as well as strokelike symptoms treated with IV TPA.  He was started on aspirin at discharge.  Apparently at that time discussed a history of gastric ulcers, patient was on Carafate, Prevacid and Prilosec at some point.  It was recommended he follow-up with [place].    10/17/2019 hemoglobin A1c 6.4.    12/09/2019 patient seen in clinic by me for epigastric discomfort and nausea with a history of gastric ulcers.  At that time patient discussed that he had not had a stroke recently and just presented as one after kidney failure from IV contrast.  At that time was cancer free for 2 years but continued with some nausea and episodes of vomiting.  He was scheduled for an EGD and started on pantoprazole 40 twice daily.  Also continued sucralfate.    02/15/2020 colonoscopy with diverticulosis in the entire colon, 5 2-6 mm polyps in sigmoid, transverse and descending colon and otherwise normal.  Pathology showed adenomatous polyps and repeat was recommended in 5 years.    02/15/2020 EGD with short segment, nonnodular Barrett's change classified as Barrett's stage C1-M2, gastritis and otherwise normal.  Repeat was recommended in 3 years.    06/07/2020 patient seen in clinic and described for the past couple of years he would have episodes about a week out of the month.  Have no energy no appetite and feels very full very fast  and bloated.  Ordered a gastric emptying study for further evaluation continue pantoprazole and sucralfate.  Explained that if the gastric emptying study was normal then could consider SIBO testing.    07/03/2020 gastric emptying study showed delayed gastric emptying.  He was started on Reglan 5 mg twice daily 20 to 30 minutes before breakfast and then again before dinner.    Today, the patient tells me that he has not had any of his severe episodes in the past month since being on Reglan 5 mg twice daily.  He also feels like this helps him to have a better fuller bowel movement.  At one point he did have a small amount of epigastric pain so he took some Carafate.  Explains that he has not been needing his Carafate or Pantoprazole on a regular basis since starting this other medication.  Does tell me he still occasionally feels bloated and "full in the middle", some of this he blames on his weight gain recently.    Denies fever, chills or weight loss.  Past Medical History:  Diagnosis Date  . Arthritis   . Diabetes mellitus type 2, uncontrolled ([place])   . Diabetic neuropathy ([place])   . GERD (gastroesophageal reflux disease)   . History of stomach ulcers   . Hypertension   . Melanoma ([place])    mets to lymph node and intestines    Past Surgical History:  Procedure Laterality Date  . ABCESS DRAINAGE     gluteal area  . COLONOSCOPY    .  INGUINAL HERNIA REPAIR N/A 08/15/2015   Procedure: HERNIA REPAIR INGUINAL INCARCERATED;  Surgeon: Alphonsa Overall, MD;  Location: [facility];  Service: General;  Laterality: N/A;  with MESH  . pyloric stenosis    . TONSILLECTOMY      Current Outpatient Medications  Medication Sig Dispense Refill  . acetaminophen (TYLENOL) 500 MG tablet Take 1,000 mg by mouth 2 (two) times daily as needed for mild pain or headache (sleep).     Marland Kitchen amLODipine (NORVASC) 10 MG tablet Take 10 mg by mouth daily.    Marland Kitchen aspirin EC 81 MG EC tablet Take 1 tablet (81 mg total) by mouth daily.  Swallow whole. 30 tablet 11  . atorvastatin (LIPITOR) 40 MG tablet Take 1 tablet (40 mg total) by mouth daily. 30 tablet 1  . Continuous Blood Gluc Receiver ([place]) DEVI by Does not apply route.    . Continuous Blood Gluc Sensor (DEXCOM G6 SENSOR) MISC by Does not apply route.    . Continuous Blood Gluc Transmit (DEXCOM G6 TRANSMITTER) MISC by Does not apply route.    . hydrochlorothiazide (HYDRODIURIL) 25 MG tablet Take 25 mg by mouth at bedtime.    . insulin degludec (TRESIBA FLEXTOUCH) 100 UNIT/ML FlexTouch Pen Inject 24 Units into the skin daily before breakfast.    . insulin lispro (HUMALOG) 100 UNIT/ML KwikPen Inject 10-12 Units into the skin See admin instructions. Injection 10-12 units three times daily before meals as needed: for CBG 180-200 10 units, >200 12 units    . Insulin Pen Needle (BD PEN NEEDLE MICRO U/F) 32G X 6 MM MISC 1 each by Other route See admin instructions. Use one pen needle to inject insulin 4 times daily. **must have appointment for additional refills.** 150 each 0  . Insulin Pen Needle (BD PEN NEEDLE MICRO U/F) 32G X 6 MM MISC USE 1 PEN NEEDLE SIX TIMES DAILY (Patient taking differently: 1 each by Other route See admin instructions. USE 1 PEN NEEDLE SIX TIMES DAILY) 200 each 2  . lisinopril (PRINIVIL,ZESTRIL) 40 MG tablet Take 40 mg by mouth at bedtime.   0  . Melatonin 10 MG TABS Take 20 mg by mouth at bedtime.    . metoCLOPramide (REGLAN) 5 MG tablet Take 1 tab 30 min before breakfast and 1 tab 30 min before supper 60 tablet 0  . montelukast (SINGULAIR) 10 MG tablet Take 10 mg by mouth at bedtime.     . Nebivolol HCl (BYSTOLIC) 20 MG TABS Take 20 mg by mouth at bedtime.    . pantoprazole (PROTONIX) 40 MG tablet Take 1 tablet by mouth twice daily 60 tablet 5  . pregabalin (LYRICA) 100 MG capsule Take 100-300 mg by mouth See admin instructions. Take one capsule (100 mg) by mouth every morning and afternoon, take three capsules (300 mg) at bedtime    .  sucralfate (CARAFATE) 1 g tablet Take 1 g by mouth 4 (four) times daily.    . traZODone (DESYREL) 100 MG tablet Take 100 mg by mouth at bedtime.  0   No current facility-administered medications for this visit.    Allergies as of 07/26/2020 - Review Complete 06/07/2020  Allergen Reaction Noted  . Contrast media [iodinated diagnostic agents] Itching 10/12/2019    Family History  Problem Relation Age of Onset  . Breast cancer Mother   . Bone cancer Mother   . Melanoma Mother   . Prostate cancer Father   . Pancreatic cancer Maternal Grandfather   . Diabetes  Paternal Aunt   . Colon cancer Neg Hx   . Colon polyps Neg Hx   . Esophageal cancer Neg Hx   . Stomach cancer Neg Hx     Social History   Socioeconomic History  . Marital status: Single    Spouse name: Not on file  . Number of children: 1  . Years of education: Not on file  . Highest education level: Not on file  Occupational History  . Occupation: retired  Tobacco Use  . Smoking status: Former Smoker    Packs/day: 1.00    Years: 5.00    Pack years: 5.00    Types: Cigarettes  . Smokeless tobacco: Never Used  Vaping Use  . Vaping Use: Never used  Substance and Sexual Activity  . Alcohol use: Yes    Alcohol/week: 2.0 standard drinks    Types: 2 Glasses of wine per week    Comment: 375 ml of red wine daily  . Drug use: Not Currently    Types: Marijuana    Comment: daily  . Sexual activity: Never  Other Topics Concern  . Not on file  Social History Narrative  . Not on file   Social Determinants of Health   Financial Resource Strain: Not on file  Food Insecurity: Not on file  Transportation Needs: Not on file  Physical Activity: Not on file  Stress: Not on file  Social Connections: Not on file  Intimate Partner Violence: Not on file    Review of Systems:    Constitutional: No weight loss, fever or chills Cardiovascular: No chest pain Respiratory: No SOB  Gastrointestinal: See HPI and otherwise  negative   Physical Exam:  Vital signs: BP (!) 126/94   Pulse 74   Ht 5\' 11"  (1.803 m)   Wt 213 lb 2 oz (96.7 kg)   BMI 29.72 kg/m   Constitutional:   Pleasant Caucasian male appears to be in NAD, Well developed, Well nourished, alert and cooperative Respiratory: Respirations even and unlabored. Lungs clear to auscultation bilaterally.   No wheezes, crackles, or rhonchi.  Cardiovascular: Normal S1, S2. No MRG. Regular rate and rhythm. No peripheral edema, cyanosis or pallor.  Gastrointestinal:  Soft, nondistended, nontender. No rebound or guarding. Normal bowel sounds. No appreciable masses or hepatomegaly. Rectal:  Not performed.  Psychiatric:  Demonstrates good judgement and reason without abnormal affect or behaviors.  See HPI for recent imaging.  Assessment: 1.  Gastroparesis: Abnormal gastric emptying study recently, patient relates bloating and some epigastric discomfort as well as reflux, has not had any issues since starting Reglan 5 mg twice daily over the past month  Plan: 1.  Patient would like to try a small increase in frequency of medicine to see if this helps with the bloating.  Increased Reglan to 5 mg 3 times a day, 20 to 30 minutes for breakfast lunch and dinner.  Prescribed #90 with 5 refills. 2.  Discussed with patient that he can try the above dosing for the next month, if he still feels like he needs an increase we have some room to go up, would add a nighttime dose first and then possibly increase to 10 mg if needed. 3.  Did discuss potential side effects from Reglan including tardive dyskinesia with the patient and to discontinue this medicine if he notices any of them and call us. 4.  Patient to follow in clinic with me in 3 to 4 months.  In the meantime advised him to set up MyChart  so that he can message me more directly as he has been having some trouble getting through to the right people in our clinic recently.  Ellouise Newer, PA-C [facility] 07/26/2020, 1:56 PM  Cc: Jesse Rasmussen, MD

## 2020-07-26 NOTE — Patient Instructions (Signed)
If you are age 66 or older, your body mass index should be between 23-30. Your Body mass index is 29.72 kg/m. If this is out of the aforementioned range listed, please consider follow up with your Primary Care Provider.  If you are age 63 or younger, your body mass index should be between 19-25. Your Body mass index is 29.72 kg/m. If this is out of the aformentioned range listed, please consider follow up with your Primary Care Provider.   We have sent the following medications to your pharmacy for you to pick up at your convenience: Reglan 5 mg.  Thank you for choosing me and [facility].  Ellouise Newer, PA-C

## 2020-07-28 NOTE — Progress Notes (Signed)
I agree with the above note, plan 

## 2020-08-03 ENCOUNTER — Encounter (INDEPENDENT_AMBULATORY_CARE_PROVIDER_SITE_OTHER): Payer: Medicare Other | Admitting: Ophthalmology

## 2020-08-27 ENCOUNTER — Emergency Department (HOSPITAL_COMMUNITY): Payer: Medicare Other

## 2020-08-27 ENCOUNTER — Inpatient Hospital Stay (HOSPITAL_COMMUNITY)
Admission: EM | Admit: 2020-08-27 | Discharge: 2020-08-29 | DRG: 074 | Disposition: A | Discharge status: Home / Self Care | Payer: Medicare Other | Attending: Internal Medicine | Admitting: Internal Medicine

## 2020-08-27 ENCOUNTER — Other Ambulatory Visit: Payer: Self-pay

## 2020-08-27 DIAGNOSIS — Z79891 Long term (current) use of opiate analgesic: Secondary | ICD-10-CM

## 2020-08-27 DIAGNOSIS — Z8042 Family history of malignant neoplasm of prostate: Secondary | ICD-10-CM

## 2020-08-27 DIAGNOSIS — E1165 Type 2 diabetes mellitus with hyperglycemia: Secondary | ICD-10-CM | POA: Diagnosis present

## 2020-08-27 DIAGNOSIS — E1143 Type 2 diabetes mellitus with diabetic autonomic (poly)neuropathy: Principal | ICD-10-CM | POA: Diagnosis present

## 2020-08-27 DIAGNOSIS — Z79899 Other long term (current) drug therapy: Secondary | ICD-10-CM

## 2020-08-27 DIAGNOSIS — N182 Chronic kidney disease, stage 2 (mild): Secondary | ICD-10-CM | POA: Diagnosis present

## 2020-08-27 DIAGNOSIS — M1990 Unspecified osteoarthritis, unspecified site: Secondary | ICD-10-CM | POA: Diagnosis present

## 2020-08-27 DIAGNOSIS — N1832 Chronic kidney disease, stage 3b: Secondary | ICD-10-CM | POA: Diagnosis present

## 2020-08-27 DIAGNOSIS — Z85820 Personal history of malignant melanoma of skin: Secondary | ICD-10-CM

## 2020-08-27 DIAGNOSIS — Z833 Family history of diabetes mellitus: Secondary | ICD-10-CM | POA: Diagnosis not present

## 2020-08-27 DIAGNOSIS — Z20822 Contact with and (suspected) exposure to covid-19: Secondary | ICD-10-CM | POA: Diagnosis present

## 2020-08-27 DIAGNOSIS — N179 Acute kidney failure, unspecified: Secondary | ICD-10-CM | POA: Diagnosis present

## 2020-08-27 DIAGNOSIS — E860 Dehydration: Secondary | ICD-10-CM | POA: Diagnosis present

## 2020-08-27 DIAGNOSIS — K219 Gastro-esophageal reflux disease without esophagitis: Secondary | ICD-10-CM | POA: Diagnosis present

## 2020-08-27 DIAGNOSIS — E119 Type 2 diabetes mellitus without complications: Secondary | ICD-10-CM

## 2020-08-27 DIAGNOSIS — Z7982 Long term (current) use of aspirin: Secondary | ICD-10-CM | POA: Diagnosis not present

## 2020-08-27 DIAGNOSIS — E1140 Type 2 diabetes mellitus with diabetic neuropathy, unspecified: Secondary | ICD-10-CM | POA: Diagnosis present

## 2020-08-27 DIAGNOSIS — I160 Hypertensive urgency: Secondary | ICD-10-CM | POA: Diagnosis present

## 2020-08-27 DIAGNOSIS — E1042 Type 1 diabetes mellitus with diabetic polyneuropathy: Secondary | ICD-10-CM | POA: Diagnosis not present

## 2020-08-27 DIAGNOSIS — Z87891 Personal history of nicotine dependence: Secondary | ICD-10-CM | POA: Diagnosis not present

## 2020-08-27 DIAGNOSIS — K3184 Gastroparesis: Secondary | ICD-10-CM | POA: Diagnosis present

## 2020-08-27 DIAGNOSIS — E1343 Other specified diabetes mellitus with diabetic autonomic (poly)neuropathy: Secondary | ICD-10-CM | POA: Diagnosis present

## 2020-08-27 DIAGNOSIS — Z794 Long term (current) use of insulin: Secondary | ICD-10-CM | POA: Diagnosis not present

## 2020-08-27 DIAGNOSIS — I10 Essential (primary) hypertension: Secondary | ICD-10-CM | POA: Diagnosis present

## 2020-08-27 DIAGNOSIS — Z91041 Radiographic dye allergy status: Secondary | ICD-10-CM

## 2020-08-27 DIAGNOSIS — D638 Anemia in other chronic diseases classified elsewhere: Secondary | ICD-10-CM | POA: Diagnosis present

## 2020-08-27 LAB — URINALYSIS, ROUTINE W REFLEX MICROSCOPIC
Bilirubin Urine: NEGATIVE
Glucose, UA: 50 mg/dL — AB
Ketones, ur: NEGATIVE mg/dL
Leukocytes,Ua: NEGATIVE
Nitrite: NEGATIVE
Protein, ur: >=300 mg/dL — AB
Specific Gravity, Urine: 1.023 (ref 1.005–1.030)
pH: 5 (ref 5.0–8.0)

## 2020-08-27 LAB — COMPREHENSIVE METABOLIC PANEL
ALT: 21 U/L (ref 0–44)
AST: 52 U/L — ABNORMAL HIGH (ref 15–41)
Albumin: 3.5 g/dL (ref 3.5–5.0)
Alkaline Phosphatase: 51 U/L (ref 38–126)
Anion gap: 4 — ABNORMAL LOW (ref 5–15)
BUN: 15 mg/dL (ref 8–23)
CO2: 27 mmol/L (ref 22–32)
Calcium: 7.9 mg/dL — ABNORMAL LOW (ref 8.9–10.3)
Chloride: 108 mmol/L (ref 98–111)
Creatinine, Ser: 1.33 mg/dL — ABNORMAL HIGH (ref 0.61–1.24)
GFR, Estimated: 59 mL/min — ABNORMAL LOW (ref >60)
Glucose, Bld: 130 mg/dL — ABNORMAL HIGH (ref 70–99)
Potassium: 4.5 mmol/L (ref 3.5–5.1)
Sodium: 139 mmol/L (ref 135–145)
Total Bilirubin: 1 mg/dL (ref 0.3–1.2)
Total Protein: 6.6 g/dL (ref 6.5–8.1)

## 2020-08-27 LAB — BLOOD GAS, VENOUS
Acid-Base Excess: 2.8 mmol/L — ABNORMAL HIGH (ref 0.0–2.0)
Bicarbonate: 28 mmol/L (ref 20.0–28.0)
FIO2: 21
O2 Saturation: 69.9 %
Patient temperature: 98.6
pCO2, Ven: 47.2 mmHg (ref 44.0–60.0)
pH, Ven: 7.39 (ref 7.250–7.430)
pO2, Ven: 39.6 mmHg (ref 32.0–45.0)

## 2020-08-27 LAB — CBC WITH DIFFERENTIAL/PLATELET
Abs Immature Granulocytes: 0.01 10*3/uL (ref 0.00–0.07)
Basophils Absolute: 0 10*3/uL (ref 0.0–0.1)
Basophils Relative: 0 %
Eosinophils Absolute: 0 10*3/uL (ref 0.0–0.5)
Eosinophils Relative: 0 %
HCT: 36.1 % — ABNORMAL LOW (ref 39.0–52.0)
Hemoglobin: 12.7 g/dL — ABNORMAL LOW (ref 13.0–17.0)
Immature Granulocytes: 0 %
Lymphocytes Relative: 16 %
Lymphs Abs: 1 10*3/uL (ref 0.7–4.0)
MCH: 30.9 pg (ref 26.0–34.0)
MCHC: 35.2 g/dL (ref 30.0–36.0)
MCV: 87.8 fL (ref 80.0–100.0)
Monocytes Absolute: 0.4 10*3/uL (ref 0.1–1.0)
Monocytes Relative: 6 %
Neutro Abs: 4.7 10*3/uL (ref 1.7–7.7)
Neutrophils Relative %: 78 %
Platelets: 164 10*3/uL (ref 150–400)
RBC: 4.11 MIL/uL — ABNORMAL LOW (ref 4.22–5.81)
RDW: 13 % (ref 11.5–15.5)
WBC: 6 10*3/uL (ref 4.0–10.5)
nRBC: 0 % (ref 0.0–0.2)

## 2020-08-27 LAB — GLUCOSE, CAPILLARY: Glucose-Capillary: 215 mg/dL — ABNORMAL HIGH (ref 70–99)

## 2020-08-27 LAB — RESP PANEL BY RT-PCR (FLU A&B, COVID) ARPGX2
Influenza A by PCR: NEGATIVE
Influenza B by PCR: NEGATIVE
SARS Coronavirus 2 by RT PCR: NEGATIVE

## 2020-08-27 LAB — TROPONIN I (HIGH SENSITIVITY)
Troponin I (High Sensitivity): 14 ng/L (ref <18)
Troponin I (High Sensitivity): 15 ng/L (ref <18)

## 2020-08-27 LAB — LACTIC ACID, PLASMA
Lactic Acid, Venous: 1.7 mmol/L (ref 0.5–1.9)
Lactic Acid, Venous: 1 mmol/L (ref 0.5–1.9)

## 2020-08-27 LAB — PHOSPHORUS: Phosphorus: 2.8 mg/dL (ref 2.5–4.6)

## 2020-08-27 LAB — MAGNESIUM: Magnesium: 1.9 mg/dL (ref 1.7–2.4)

## 2020-08-27 LAB — LIPASE, BLOOD: Lipase: 25 U/L (ref 11–51)

## 2020-08-27 LAB — PROTIME-INR
INR: 1 (ref 0.8–1.2)
Prothrombin Time: 13.3 seconds (ref 11.4–15.2)

## 2020-08-27 MED ORDER — LABETALOL HCL 5 MG/ML IV SOLN
20.0000 mg | Freq: Once | INTRAVENOUS | Status: AC
  Administered 2020-08-27: 20 mg via INTRAVENOUS
  Filled 2020-08-27 (×2): qty 4

## 2020-08-27 MED ORDER — METOCLOPRAMIDE HCL 5 MG/ML IJ SOLN
10.0000 mg | Freq: Four times a day (QID) | INTRAMUSCULAR | Status: DC
  Administered 2020-08-28 – 2020-08-29 (×6): 10 mg via INTRAVENOUS
  Filled 2020-08-27 (×6): qty 2

## 2020-08-27 MED ORDER — ONDANSETRON HCL 4 MG/2ML IJ SOLN
4.0000 mg | Freq: Once | INTRAMUSCULAR | Status: AC
  Administered 2020-08-27: 4 mg via INTRAVENOUS
  Filled 2020-08-27: qty 2

## 2020-08-27 MED ORDER — ENOXAPARIN SODIUM 40 MG/0.4ML IJ SOSY
40.0000 mg | PREFILLED_SYRINGE | INTRAMUSCULAR | Status: DC
  Administered 2020-08-28: 40 mg via SUBCUTANEOUS
  Filled 2020-08-27 (×2): qty 0.4

## 2020-08-27 MED ORDER — ONDANSETRON HCL 4 MG PO TABS: 4.0000 mg | ORAL_TABLET | Freq: Four times a day (QID) | ORAL | Status: DC | PRN

## 2020-08-27 MED ORDER — INSULIN ASPART 100 UNIT/ML IJ SOLN
0.0000 [IU] | Freq: Every day | INTRAMUSCULAR | Status: DC
  Administered 2020-08-27: 2 [IU] via SUBCUTANEOUS

## 2020-08-27 MED ORDER — SODIUM CHLORIDE 0.9 % IV SOLN: INTRAVENOUS | Status: DC

## 2020-08-27 MED ORDER — INSULIN ASPART 100 UNIT/ML IJ SOLN
0.0000 [IU] | Freq: Three times a day (TID) | INTRAMUSCULAR | Status: DC
  Administered 2020-08-28: 3 [IU] via SUBCUTANEOUS
  Administered 2020-08-28: 2 [IU] via SUBCUTANEOUS
  Administered 2020-08-28: 3 [IU] via SUBCUTANEOUS
  Administered 2020-08-29: 5 [IU] via SUBCUTANEOUS

## 2020-08-27 MED ORDER — METOPROLOL TARTRATE 5 MG/5ML IV SOLN
5.0000 mg | Freq: Four times a day (QID) | INTRAVENOUS | Status: DC | PRN
  Administered 2020-08-27 – 2020-08-29 (×5): 5 mg via INTRAVENOUS
  Filled 2020-08-27 (×5): qty 5

## 2020-08-27 MED ORDER — MORPHINE SULFATE (PF) 2 MG/ML IV SOLN
2.0000 mg | INTRAVENOUS | Status: DC | PRN
  Administered 2020-08-28: 2 mg via INTRAVENOUS
  Filled 2020-08-27: qty 1

## 2020-08-27 MED ORDER — METOCLOPRAMIDE HCL 5 MG/ML IJ SOLN
10.0000 mg | Freq: Once | INTRAMUSCULAR | Status: AC
  Administered 2020-08-27: 10 mg via INTRAVENOUS
  Filled 2020-08-27: qty 2

## 2020-08-27 MED ORDER — LABETALOL HCL 5 MG/ML IV SOLN
20.0000 mg | Freq: Once | INTRAVENOUS | Status: AC
  Administered 2020-08-27: 20 mg via INTRAVENOUS
  Filled 2020-08-27: qty 4

## 2020-08-27 MED ORDER — LORAZEPAM 2 MG/ML IJ SOLN
1.0000 mg | Freq: Once | INTRAMUSCULAR | Status: AC
  Administered 2020-08-27: 1 mg via INTRAVENOUS
  Filled 2020-08-27: qty 1

## 2020-08-27 MED ORDER — ONDANSETRON HCL 4 MG/2ML IJ SOLN
4.0000 mg | Freq: Four times a day (QID) | INTRAMUSCULAR | Status: DC | PRN
  Administered 2020-08-28 – 2020-08-29 (×5): 4 mg via INTRAVENOUS
  Filled 2020-08-27 (×5): qty 2

## 2020-08-27 NOTE — H&P (Signed)
History and Physical   Jesse Lane [id number] DOB: 04/14/1955 DOA: 08/27/2020  Referring MD/NP/PA: Dr Johnney Killian  PCP: [name], MD   Outpatient Specialists: None   Patient coming from: Home  Chief Complaint: Weakness, NV  HPI: COBRA DEPASS is a 65 y.o. male with medical history significant of pending type 2 diabetes, diabetic neuropathy, GERD, essential hypertension, new diagnosis of diabetic gastroparesis about 2 months ago who presents to the ER intractable nausea vomiting. Symptoms started 4 days ago and have worsened.  This is consistent with his previous episodes of gastroparesis.  He has been unable to take anything or keep anything down.  This includes his medications.  As a result his blood pressure has been markedly elevated.  He had in the ER systolic was more than [id number] and diastolic more than [id number].  Patient also feels dehydrated and weak in general he is being admitted with intractable nausea vomiting secondary to diabetic gastroparesis as well as hypertensive urgency.  Denied any weakness.  Denied any sick contacts.  Denied any fever no chills no diarrhea.  Denied any melena or bright red blood per rectum no hematemesis.  ED Course: Temperature 98.7 blood pressure 236/206, pulse 83 respiratory 24 oxygen sat 97% on room air White count 6.0 hemoglobin 12.7 and platelets 164.  Chemistry showed a creatinine of 1.33 calcium 7.9 and glucose 130.  INR 1.0.  Lactic acid 1.7.  COVID-19 screen is negative.  CT abdomen pelvis showed no acute findings.  Patient is being admitted with diabetic gastroparesis.  Review of Systems: As per HPI otherwise 10 point review of systems negative.    Past Medical History:  Diagnosis Date  . Arthritis   . Diabetes mellitus type 2, uncontrolled ([place])   . Diabetic neuropathy ([place])   . GERD (gastroesophageal reflux disease)   . History of stomach ulcers   . Hypertension   . Melanoma ([place])    mets to lymph node and intestines    Past  Surgical History:  Procedure Laterality Date  . ABCESS DRAINAGE     gluteal area  . COLONOSCOPY    . INGUINAL HERNIA REPAIR N/A 08/15/2015   Procedure: HERNIA REPAIR INGUINAL INCARCERATED;  Surgeon: Alphonsa Overall, MD;  Location: [facility];  Service: General;  Laterality: N/A;  with MESH  . pyloric stenosis    . TONSILLECTOMY       reports that he has quit smoking. His smoking use included cigarettes. He has a 5.00 pack-year smoking history. He has never used smokeless tobacco. He reports current alcohol use of about 2.0 standard drinks of alcohol per week. He reports previous drug use. Drug: Marijuana.  Allergies  Allergen Reactions  . Contrast Media [Iodinated Diagnostic Agents] Itching    Family History  Problem Relation Age of Onset  . Breast cancer Mother   . Bone cancer Mother   . Melanoma Mother   . Prostate cancer Father   . Pancreatic cancer Maternal Grandfather   . Diabetes Paternal Aunt   . Colon cancer Neg Hx   . Colon polyps Neg Hx   . Esophageal cancer Neg Hx   . Stomach cancer Neg Hx      Prior to Admission medications   Medication Sig Start Date End Date Taking? Authorizing Provider  acetaminophen (TYLENOL) 500 MG tablet Take 1,000 mg by mouth 2 (two) times daily as needed for mild pain or headache (sleep).     [provider]  amLODipine (NORVASC) 10 MG tablet Take  10 mg by mouth daily. 11/28/19   [provider]  aspirin EC 81 MG EC tablet Take 1 tablet (81 mg total) by mouth daily. Swallow whole. 10/19/19   Rinehuls, Early Chars, PA-C  atorvastatin (LIPITOR) 40 MG tablet Take 1 tablet (40 mg total) by mouth daily. 10/19/19   Rinehuls, Early Chars, PA-C  Continuous Blood Gluc Receiver ([place]) DEVI by Does not apply route.    [provider]  Continuous Blood Gluc Sensor (DEXCOM G6 SENSOR) MISC by Does not apply route.    [provider]  Continuous Blood Gluc Transmit (DEXCOM G6 TRANSMITTER) MISC by Does not apply route.     [provider]  hydrochlorothiazide (HYDRODIURIL) 25 MG tablet Take 25 mg by mouth at bedtime.    [provider]  insulin degludec (TRESIBA FLEXTOUCH) 100 UNIT/ML FlexTouch Pen Inject 24 Units into the skin daily before breakfast.    [provider]  insulin lispro (HUMALOG) 100 UNIT/ML KwikPen Inject 10-12 Units into the skin See admin instructions. Injection 10-12 units three times daily before meals as needed: for CBG 180-200 10 units, >200 12 units    [provider]  Insulin Pen Needle (BD PEN NEEDLE MICRO U/F) 32G X 6 MM MISC 1 each by Other route See admin instructions. Use one pen needle to inject insulin 4 times daily. **must have appointment for additional refills.** 06/30/18   Renato Shin, MD  Insulin Pen Needle (BD PEN NEEDLE MICRO U/F) 32G X 6 MM MISC USE 1 PEN NEEDLE SIX TIMES DAILY Patient taking differently: 1 each by Other route See admin instructions. USE 1 PEN NEEDLE SIX TIMES DAILY 07/02/18   Renato Shin, MD  lisinopril (PRINIVIL,ZESTRIL) 40 MG tablet Take 40 mg by mouth at bedtime.  07/24/16   [provider]  Melatonin 10 MG TABS Take 20 mg by mouth at bedtime.    [provider]  metoCLOPramide (REGLAN) 5 MG tablet Take one tablet 20-30 minutes before meals. 07/26/20   Levin Erp, PA  montelukast (SINGULAIR) 10 MG tablet Take 10 mg by mouth at bedtime.  10/13/19   [provider]  Nebivolol HCl (BYSTOLIC) 20 MG TABS Take 20 mg by mouth at bedtime.    [provider]  pantoprazole (PROTONIX) 40 MG tablet Take 1 tablet by mouth twice daily 05/04/20   Milus Banister, MD  pregabalin (LYRICA) 100 MG capsule Take 100-300 mg by mouth See admin instructions. Take one capsule (100 mg) by mouth every morning and afternoon, take three capsules (300 mg) at bedtime 10/15/19   [provider]  sucralfate (CARAFATE) 1 g tablet Take 1 g by mouth 4 (four) times daily. 10/13/19   [provider]   traZODone (DESYREL) 100 MG tablet Take 100 mg by mouth at bedtime. 07/24/16   [provider]    Physical Exam: Vitals:   08/27/20 1705 08/27/20 1740 08/27/20 1830 08/27/20 1910  BP: (!) 210/105 (!) 200/99 (!) 184/94 (!) 182/93  Pulse: 70 76 80 83  Resp: (!) 22 (!) 24 (!) 23 19  Temp:      TempSrc:      SpO2: 95% 91% 93% 93%  Weight:      Height:          Constitutional: Acutely ill looking, mild distress Vitals:   08/27/20 1705 08/27/20 1740 08/27/20 1830 08/27/20 1910  BP: (!) 210/105 (!) 200/99 (!) 184/94 (!) 182/93  Pulse: 70 76 80 83  Resp: Marland Kitchen)  22 (!) 24 (!) 23 19  Temp:      TempSrc:      SpO2: 95% 91% 93% 93%  Weight:      Height:       Eyes: PERRL, lids and conjunctivae normal ENMT: Mucous membranes are dry posterior pharynx clear of any exudate or lesions.Normal dentition.  Neck: normal, supple, no masses, no thyromegaly Respiratory: clear to auscultation bilaterally, no wheezing, no crackles. Normal respiratory effort. No accessory muscle use.  Cardiovascular: Sinus tachycardia no murmurs / rubs / gallops. No extremity edema. 2+ pedal pulses. No carotid bruits.  Abdomen: Mild diffuse, no masses palpated. No hepatosplenomegaly. Bowel sounds positive.  Musculoskeletal: no clubbing / cyanosis. No joint deformity upper and lower extremities. Good ROM, no contractures. Normal muscle tone.  Skin: no rashes, lesions, ulcers. No induration Neurologic: CN 2-12 grossly intact. Sensation intact, DTR normal. Strength 5/5 in all 4.  Psychiatric: Normal judgment and insight. Alert and oriented x 3. Normal mood.     Labs on Admission: I have personally reviewed following labs and imaging studies  CBC: Recent Labs  Lab 08/27/20 1453  WBC 6.0  NEUTROABS 4.7  HGB 12.7*  HCT 36.1*  MCV 87.8  PLT 979   Basic Metabolic Panel: Recent Labs  Lab 08/27/20 1453  NA 139  K 4.5  CL 108  CO2 27  GLUCOSE 130*  BUN 15  CREATININE 1.33*  CALCIUM 7.9*  MG 1.9   PHOS 2.8   GFR: Estimated Creatinine Clearance: 63 mL/min (A) (by C-G formula based on SCr of 1.33 mg/dL (H)). Liver Function Tests: Recent Labs  Lab 08/27/20 1453  AST 52*  ALT 21  ALKPHOS 51  BILITOT 1.0  PROT 6.6  ALBUMIN 3.5   Recent Labs  Lab 08/27/20 1453  LIPASE 25   No results for input(s): AMMONIA in the last 168 hours. Coagulation Profile: Recent Labs  Lab 08/27/20 1453  INR 1.0   Cardiac Enzymes: No results for input(s): CKTOTAL, CKMB, CKMBINDEX, TROPONINI in the last 168 hours. BNP (last 3 results) No results for input(s): PROBNP in the last 8760 hours. HbA1C: No results for input(s): HGBA1C in the last 72 hours. CBG: No results for input(s): GLUCAP in the last 168 hours. Lipid Profile: No results for input(s): CHOL, HDL, LDLCALC, TRIG, CHOLHDL, LDLDIRECT in the last 72 hours. Thyroid Function Tests: No results for input(s): TSH, T4TOTAL, FREET4, T3FREE, THYROIDAB in the last 72 hours. Anemia Panel: No results for input(s): VITAMINB12, FOLATE, FERRITIN, TIBC, IRON, RETICCTPCT in the last 72 hours. Urine analysis:    Component Value Date/Time   COLORURINE YELLOW 10/16/2019 1624   APPEARANCEUR CLEAR 10/16/2019 1624   LABSPEC 1.019 10/16/2019 1624   PHURINE 5.0 10/16/2019 1624   GLUCOSEU NEGATIVE 10/16/2019 1624   HGBUR SMALL (A) 10/16/2019 1624   BILIRUBINUR NEGATIVE 10/16/2019 1624   KETONESUR 5 (A) 10/16/2019 1624   PROTEINUR 100 (A) 10/16/2019 1624   UROBILINOGEN 0.2 12/05/2011 1655   NITRITE NEGATIVE 10/16/2019 1624   LEUKOCYTESUR NEGATIVE 10/16/2019 1624   Sepsis Labs: @LABRCNTIP (procalcitonin:4,lacticidven:4) ) Recent Results (from the past 240 hour(s))  Resp Panel by RT-PCR (Flu A&B, Covid) Nasopharyngeal Swab     Status: None   Collection Time: 08/27/20  2:55 PM   Specimen: Nasopharyngeal Swab; Nasopharyngeal(NP) swabs in vial transport medium  Result Value Ref Range Status   SARS Coronavirus 2 by RT PCR NEGATIVE NEGATIVE Final     Comment: (NOTE) SARS-CoV-2 target nucleic acids are NOT DETECTED.  The SARS-CoV-2  RNA is generally detectable in upper respiratory specimens during the acute phase of infection. The lowest concentration of SARS-CoV-2 viral copies this assay can detect is 138 copies/mL. A negative result does not preclude SARS-Cov-2 infection and should not be used as the sole basis for treatment or other patient management decisions. A negative result may occur with  improper specimen collection/handling, submission of specimen other than nasopharyngeal swab, presence of viral mutation(s) within the areas targeted by this assay, and inadequate number of viral copies(<138 copies/mL). A negative result must be combined with clinical observations, patient history, and epidemiological information. The expected result is Negative.  Fact Sheet for Patients:  EntrepreneurPulse.com.au  Fact Sheet for Healthcare Providers:  IncredibleEmployment.be  This test is no t yet approved or cleared by the [place] FDA and  has been authorized for detection and/or diagnosis of SARS-CoV-2 by FDA under an Emergency Use Authorization (EUA). This EUA will remain  in effect (meaning this test can be used) for the duration of the COVID-19 declaration under Section 564(b)(1) of the Act, 21 U.S.C.section 360bbb-3(b)(1), unless the authorization is terminated  or revoked sooner.       Influenza A by PCR NEGATIVE NEGATIVE Final   Influenza B by PCR NEGATIVE NEGATIVE Final    Comment: (NOTE) The Xpert Xpress SARS-CoV-2/FLU/RSV plus assay is intended as an aid in the diagnosis of influenza from Nasopharyngeal swab specimens and should not be used as a sole basis for treatment. Nasal washings and aspirates are unacceptable for Xpert Xpress SARS-CoV-2/FLU/RSV testing.  Fact Sheet for Patients: EntrepreneurPulse.com.au  Fact Sheet for Healthcare  Providers: IncredibleEmployment.be  This test is not yet approved or cleared by the [place] FDA and has been authorized for detection and/or diagnosis of SARS-CoV-2 by FDA under an Emergency Use Authorization (EUA). This EUA will remain in effect (meaning this test can be used) for the duration of the COVID-19 declaration under Section 564(b)(1) of the Act, 21 U.S.C. section 360bbb-3(b)(1), unless the authorization is terminated or revoked.  Performed at [facility], [place] [address] [place], [place] [address]      Radiological Exams on Admission: CT Abdomen Pelvis Wo Contrast  Result Date: 08/27/2020 CLINICAL DATA:  Abdominal distension, cramping EXAM: CT ABDOMEN AND PELVIS WITHOUT CONTRAST TECHNIQUE: Multidetector CT imaging of the abdomen and pelvis was performed following the standard protocol without IV contrast. COMPARISON:  03/21/2020 FINDINGS: Lower chest: No acute abnormality. Hepatobiliary: No focal hepatic abnormality. Gallbladder unremarkable. Pancreas: Pancreatic atrophy. No focal abnormality or ductal dilatation. Spleen: No focal abnormality.  Normal size. Adrenals/Urinary Tract: No renal or adrenal mass. No renal or ureteral stones. No hydronephrosis. Urinary bladder unremarkable. Stomach/Bowel: Sigmoid diverticulosis. No active diverticulitis. Stomach and small bowel decompressed, unremarkable. Appendix visualized, normal. Vascular/Lymphatic: Aortic atherosclerosis. No evidence of aneurysm or adenopathy. Reproductive: No visible focal abnormality. Other: No free fluid or free air. Musculoskeletal: No acute bony abnormality. IMPRESSION: No renal or ureteral stones.  No hydronephrosis. Aortic atherosclerosis. Sigmoid diverticulosis. No acute findings in the abdomen or pelvis. Electronically Signed   By: Rolm Baptise M.D.   On: 08/27/2020 15:48    EKG: Independently reviewed.  Shows sinus rhythm with prolonged PR interval of more than 230  no significant ST changes  Assessment/Plan Principal Problem:   Gastroparesis due to secondary diabetes ([place]) Active Problems:   Diabetic neuropathy ([facility])   Essential hypertension   Anemia of chronic disease   Diabetes ([place])   AKI (acute kidney injury) ([place])   Hypertensive urgency     #  1 diabetic gastroparesis: Patient appears to have a flareup of his symptoms.  We will admit the patient.  Keep n.p.o. for now.  Symptoms controlled.  Initiate Reglan with Zofran and clinically.  IV fluids.  Monitor electrolytes.  #2 AKI: Most likely secondary to dehydration.  Continue close monitoring.  #3 uncontrolled diabetes type 2: Sliding scale insulin.  Hold metformin.  #4 hypertensive urgency: Patient has received IV labetalol in the ER.  Slight improvement.  Continue IV beta-blockers as needed while NPO.  #5 diabetic neuropathy: Patient taking tramadol on and off at home.  We ordered that once confirmed.   DVT prophylaxis: Lovenox Code Status: Full code Family Communication: At bedside Disposition Plan: Home Consults called: None Admission status: Inpatient  Severity of Illness: The appropriate patient status for this patient is INPATIENT. Inpatient status is judged to be reasonable and necessary in order to provide the required intensity of service to ensure the patient's safety. The patient's presenting symptoms, physical exam findings, and initial radiographic and laboratory data in the context of their chronic comorbidities is felt to place them at high risk for further clinical deterioration. Furthermore, it is not anticipated that the patient will be medically stable for discharge from the hospital within 2 midnights of admission. The following factors support the patient status of inpatient.   " The patient's presenting symptoms include generalized weakness with intractable nausea vomiting. " The worrisome physical exam findings include dry mucous membranes. " The initial  radiographic and laboratory data are worrisome because of no significant electrolyte abnormalities. " The chronic co-morbidities include diabetes.   * I certify that at the point of admission it is my clinical judgment that the patient will require inpatient hospital care spanning beyond 2 midnights from the point of admission due to high intensity of service, high risk for further deterioration and high frequency of surveillance required.Barbette Merino MD [facility] Pager [phone]  If 7PM-7AM, please contact night-coverage [email or web address] Password [facility]  08/27/2020, 7:37 PM

## 2020-08-27 NOTE — ED Triage Notes (Signed)
Pt states, N/V, stomach cramping and tightness for 4 days

## 2020-08-27 NOTE — ED Notes (Signed)
B/P high , md aware, pt has not taken meds

## 2020-08-27 NOTE — ED Notes (Signed)
Notified MD Pfeiffer of pts bp; pt also nauseated. Notified MD

## 2020-08-27 NOTE — ED Notes (Signed)
Patient stated that he is allergic to contrast

## 2020-08-27 NOTE — ED Notes (Signed)
Pt still states he can not urinate

## 2020-08-27 NOTE — ED Provider Notes (Signed)
[facility] DEPT Provider Note   CSN: [id number] Arrival date & time: 08/27/20  1319     History Chief Complaint  Patient presents with  . Weakness  . Hypertension    Jesse Lane is a 65 y.o. male.  HPI Patient reports he has a diagnosis of gastroparesis.  He has been taking Reglan with pretty good control recently.  However, yesterday evening he started to get nauseated and could not take his medications.  He reports by morning he was vomiting actively.  He reports now he will spontaneously start vomiting fairly large volumes.  He is not having much abdominal pain but is getting sensation of pressure in his epigastrium.  He reports he has not build to eat or drink anything since this vomiting started.  No diarrhea.  Last bowel movement about 2 days ago.    Past Medical History:  Diagnosis Date  . Arthritis   . Diabetes mellitus type 2, uncontrolled ([place])   . Diabetic neuropathy ([place])   . GERD (gastroesophageal reflux disease)   . History of stomach ulcers   . Hypertension   . Melanoma ([place])    mets to lymph node and intestines    Patient Active Problem List   Diagnosis Date Noted  . Stroke ([place]) 10/16/2019  . Multinodular goiter 12/27/2016  . Diabetes ([place]) 11/26/2016  . Protein-calorie malnutrition, severe 09/17/2016  . DKA (diabetic ketoacidoses) 09/17/2016  . Diabetic ketoacidosis without coma associated with type 2 diabetes mellitus ([place])   . Fecal impaction in rectum ([place])   . Hyperglycemia 09/16/2016  . Hyperglycemic hyperosmolar nonketotic coma ([place]) 09/16/2016  . High anion gap metabolic acidosis 0000000  . Lactic acidosis 09/16/2016  . Anemia of chronic disease 09/16/2016  . Diabetic neuropathy ([place]) 09/06/2016  . Essential hypertension 09/06/2016  . Malignant melanoma of axilla ([place]) 08/12/2016    Past Surgical History:  Procedure Laterality Date  . ABCESS DRAINAGE     gluteal area  . COLONOSCOPY    . INGUINAL  HERNIA REPAIR N/A 08/15/2015   Procedure: HERNIA REPAIR INGUINAL INCARCERATED;  Surgeon: Alphonsa Overall, MD;  Location: [facility];  Service: General;  Laterality: N/A;  with MESH  . pyloric stenosis    . TONSILLECTOMY         Family History  Problem Relation Age of Onset  . Breast cancer Mother   . Bone cancer Mother   . Melanoma Mother   . Prostate cancer Father   . Pancreatic cancer Maternal Grandfather   . Diabetes Paternal Aunt   . Colon cancer Neg Hx   . Colon polyps Neg Hx   . Esophageal cancer Neg Hx   . Stomach cancer Neg Hx     Social History   Tobacco Use  . Smoking status: Former Smoker    Packs/day: 1.00    Years: 5.00    Pack years: 5.00    Types: Cigarettes  . Smokeless tobacco: Never Used  Vaping Use  . Vaping Use: Never used  Substance Use Topics  . Alcohol use: Yes    Alcohol/week: 2.0 standard drinks    Types: 2 Glasses of wine per week    Comment: 375 ml of red wine daily  . Drug use: Not Currently    Types: Marijuana    Comment: daily    Home Medications Prior to Admission medications   Medication Sig Start Date End Date Taking? Authorizing Provider  acetaminophen (TYLENOL) 500 MG tablet Take 1,000 mg by mouth 2 (  two) times daily as needed for mild pain or headache (sleep).     [provider]  amLODipine (NORVASC) 10 MG tablet Take 10 mg by mouth daily. 11/28/19   [provider]  aspirin EC 81 MG EC tablet Take 1 tablet (81 mg total) by mouth daily. Swallow whole. 10/19/19   Rinehuls, Early Chars, PA-C  atorvastatin (LIPITOR) 40 MG tablet Take 1 tablet (40 mg total) by mouth daily. 10/19/19   Rinehuls, Early Chars, PA-C  Continuous Blood Gluc Receiver ([place]) DEVI by Does not apply route.    [provider]  Continuous Blood Gluc Sensor (DEXCOM G6 SENSOR) MISC by Does not apply route.    [provider]  Continuous Blood Gluc Transmit (DEXCOM G6 TRANSMITTER) MISC by Does not apply route.    [provider]  hydrochlorothiazide (HYDRODIURIL) 25 MG tablet Take 25 mg by mouth at bedtime.    [provider]  insulin degludec (TRESIBA FLEXTOUCH) 100 UNIT/ML FlexTouch Pen Inject 24 Units into the skin daily before breakfast.    [provider]  insulin lispro (HUMALOG) 100 UNIT/ML KwikPen Inject 10-12 Units into the skin See admin instructions. Injection 10-12 units three times daily before meals as needed: for CBG 180-200 10 units, >200 12 units    [provider]  Insulin Pen Needle (BD PEN NEEDLE MICRO U/F) 32G X 6 MM MISC 1 each by Other route See admin instructions. Use one pen needle to inject insulin 4 times daily. **must have appointment for additional refills.** 06/30/18   Renato Shin, MD  Insulin Pen Needle (BD PEN NEEDLE MICRO U/F) 32G X 6 MM MISC USE 1 PEN NEEDLE SIX TIMES DAILY Patient taking differently: 1 each by Other route See admin instructions. USE 1 PEN NEEDLE SIX TIMES DAILY 07/02/18   Renato Shin, MD  lisinopril (PRINIVIL,ZESTRIL) 40 MG tablet Take 40 mg by mouth at bedtime.  07/24/16   [provider]  Melatonin 10 MG TABS Take 20 mg by mouth at bedtime.    [provider]  metoCLOPramide (REGLAN) 5 MG tablet Take one tablet 20-30 minutes before meals. 07/26/20   Levin Erp, PA  montelukast (SINGULAIR) 10 MG tablet Take 10 mg by mouth at bedtime.  10/13/19   [provider]  Nebivolol HCl (BYSTOLIC) 20 MG TABS Take 20 mg by mouth at bedtime.    [provider]  pantoprazole (PROTONIX) 40 MG tablet Take 1 tablet by mouth twice daily 05/04/20   Milus Banister, MD  pregabalin (LYRICA) 100 MG capsule Take 100-300 mg by mouth See admin instructions. Take one capsule (100 mg) by mouth every morning and afternoon, take three capsules (300 mg) at bedtime 10/15/19   [provider]  sucralfate (CARAFATE) 1 g tablet Take 1 g by mouth 4 (four) times daily. 10/13/19   [provider]  traZODone (DESYREL) 100  MG tablet Take 100 mg by mouth at bedtime. 07/24/16   [provider]    Allergies    Contrast media [iodinated diagnostic agents]  Review of Systems   Review of Systems 10 systems reviewed and negative except as per HPI Physical Exam Updated Vital Signs BP (!) 184/94   Pulse 80   Temp 98.6 F (37 C)   Resp (!) 23   Ht 5\' 11"  (1.803 m)   Wt 90.7 kg   SpO2 93%   BMI 27.89 kg/m   Physical Exam Constitutional:      Comments: Alert  nontoxic.  No respiratory distress.  Patient did have an episode of onset of active vomiting.  He produced a large amount of thin yellowish-brown emesis.  HENT:     Head: Normocephalic and atraumatic.     Mouth/Throat:     Pharynx: Oropharynx is clear.  Eyes:     Extraocular Movements: Extraocular movements intact.  Cardiovascular:     Rate and Rhythm: Normal rate and regular rhythm.  Pulmonary:     Effort: Pulmonary effort is normal.     Breath sounds: Normal breath sounds.  Abdominal:     Comments: Abdomen mildly distended.  Soft.  Nontender.  Musculoskeletal:        General: Normal range of motion.     Comments: 1+ swelling bilateral ankles.  Calves nontender.  Skin:    General: Skin is warm and dry.  Neurological:     General: No focal deficit present.     Mental Status: He is oriented to person, place, and time.     Motor: No weakness.     Coordination: Coordination normal.  Psychiatric:        Mood and Affect: Mood normal.     ED Results / Procedures / Treatments   Labs (all labs ordered are listed, but only abnormal results are displayed) Labs Reviewed  COMPREHENSIVE METABOLIC PANEL - Abnormal; Notable for the following components:      Result Value   Glucose, Bld 130 (*)    Creatinine, Ser 1.33 (*)    Calcium 7.9 (*)    AST 52 (*)    GFR, Estimated 59 (*)    Anion gap 4 (*)    All other components within normal limits  CBC WITH DIFFERENTIAL/PLATELET - Abnormal; Notable for the following components:   RBC 4.11  (*)    Hemoglobin 12.7 (*)    HCT 36.1 (*)    All other components within normal limits  BLOOD GAS, VENOUS - Abnormal; Notable for the following components:   Acid-Base Excess 2.8 (*)    All other components within normal limits  RESP PANEL BY RT-PCR (FLU A&B, COVID) ARPGX2  LIPASE, BLOOD  LACTIC ACID, PLASMA  LACTIC ACID, PLASMA  PROTIME-INR  MAGNESIUM  PHOSPHORUS  URINALYSIS, ROUTINE W REFLEX MICROSCOPIC  TROPONIN I (HIGH SENSITIVITY)  TROPONIN I (HIGH SENSITIVITY)    EKG EKG Interpretation  Date/Time:  Saturday Aug 27 2020 14:36:05 EDT Ventricular Rate:  68 PR Interval:  275 QRS Duration: 94 QT Interval:  439 QTC Calculation: 467 R Axis:   108 Text Interpretation: Sinus rhythm Prolonged PR interval Right axis deviation No acute ischemic changes.  No significant change from previous Confirmed by Charlesetta Shanks [phone]) on 08/27/2020 3:02:21 PM   Radiology CT Abdomen Pelvis Wo Contrast  Result Date: 08/27/2020 CLINICAL DATA:  Abdominal distension, cramping EXAM: CT ABDOMEN AND PELVIS WITHOUT CONTRAST TECHNIQUE: Multidetector CT imaging of the abdomen and pelvis was performed following the standard protocol without IV contrast. COMPARISON:  03/21/2020 FINDINGS: Lower chest: No acute abnormality. Hepatobiliary: No focal hepatic abnormality. Gallbladder unremarkable. Pancreas: Pancreatic atrophy. No focal abnormality or ductal dilatation. Spleen: No focal abnormality.  Normal size. Adrenals/Urinary Tract: No renal or adrenal mass. No renal or ureteral stones. No hydronephrosis. Urinary bladder unremarkable. Stomach/Bowel: Sigmoid diverticulosis. No active diverticulitis. Stomach and small bowel decompressed, unremarkable. Appendix visualized, normal. Vascular/Lymphatic: Aortic atherosclerosis. No evidence of aneurysm or adenopathy. Reproductive: No visible focal abnormality. Other: No free fluid or free air. Musculoskeletal: No acute bony abnormality. IMPRESSION: No renal or  ureteral  stones.  No hydronephrosis. Aortic atherosclerosis. Sigmoid diverticulosis. No acute findings in the abdomen or pelvis. Electronically Signed   By: Rolm Baptise M.D.   On: 08/27/2020 15:48    Procedures Procedures  CRITICAL CARE Performed by: Charlesetta Shanks   Total critical care time: 30 minutes  Critical care time was exclusive of separately billable procedures and treating other patients.  Critical care was necessary to treat or prevent imminent or life-threatening deterioration.  Critical care was time spent personally by me on the following activities: development of treatment plan with patient and/or surrogate as well as nursing, discussions with consultants, evaluation of patient's response to treatment, examination of patient, obtaining history from patient or surrogate, ordering and performing treatments and interventions, ordering and review of laboratory studies, ordering and review of radiographic studies, pulse oximetry and re-evaluation of patient's condition. Medications Ordered in ED Medications  metoCLOPramide (REGLAN) injection 10 mg (10 mg Intravenous Given 08/27/20 1521)  LORazepam (ATIVAN) injection 1 mg (1 mg Intravenous Given 08/27/20 1521)  labetalol (NORMODYNE) injection 20 mg (20 mg Intravenous Given 08/27/20 1719)  ondansetron (ZOFRAN) injection 4 mg (4 mg Intravenous Given 08/27/20 1718)  labetalol (NORMODYNE) injection 20 mg (20 mg Intravenous Given 08/27/20 1853)    ED Course  I have reviewed the triage vital signs and the nursing notes.  Pertinent labs & imaging results that were available during my care of the patient were reviewed by me and considered in my medical decision making (see chart for details).    MDM Rules/Calculators/A&P                           Consult: Dr. Jonelle Sidle for admission  Patient presents as outlined.  He has a confirmed history of gastroparesis.  Patient is type II diabetic.  He has not been on take his medications overnight or this  morning.  Patient did have ongoing vomiting of thin yellow-brown material, copious volumes.  CT does not show any obstruction.  At this time, I am treating this symptomatically.  Patient has gotten some improvement with Reglan Ativan and Zofran.  Blood pressures are significantly elevated greater than 245 systolic and 809 diastolic.  I have started treating with labetalol in incremental doses.  With a total of 40 mg of labetalol pressures trended down to 180s over 90s.  Patient will need mission for continued symptomatic control of gastroparesis vomiting and a diabetic patient with hypertensive urgency Final Clinical Impression(s) / ED Diagnoses Final diagnoses:  Hypertensive urgency  Gastroparesis    Rx / DC Orders ED Discharge Orders    None       Charlesetta Shanks, MD 08/27/20 1911

## 2020-08-28 ENCOUNTER — Other Ambulatory Visit: Payer: Self-pay

## 2020-08-28 ENCOUNTER — Encounter (HOSPITAL_COMMUNITY): Payer: Self-pay | Admitting: Internal Medicine

## 2020-08-28 DIAGNOSIS — I160 Hypertensive urgency: Secondary | ICD-10-CM | POA: Diagnosis not present

## 2020-08-28 DIAGNOSIS — N179 Acute kidney failure, unspecified: Secondary | ICD-10-CM

## 2020-08-28 DIAGNOSIS — E1343 Other specified diabetes mellitus with diabetic autonomic (poly)neuropathy: Secondary | ICD-10-CM | POA: Diagnosis not present

## 2020-08-28 LAB — CBC
HCT: 37 % — ABNORMAL LOW (ref 39.0–52.0)
Hemoglobin: 12.7 g/dL — ABNORMAL LOW (ref 13.0–17.0)
MCH: 31.1 pg (ref 26.0–34.0)
MCHC: 34.3 g/dL (ref 30.0–36.0)
MCV: 90.5 fL (ref 80.0–100.0)
Platelets: 160 10*3/uL (ref 150–400)
RBC: 4.09 MIL/uL — ABNORMAL LOW (ref 4.22–5.81)
RDW: 13.2 % (ref 11.5–15.5)
WBC: 5.8 10*3/uL (ref 4.0–10.5)
nRBC: 0 % (ref 0.0–0.2)

## 2020-08-28 LAB — COMPREHENSIVE METABOLIC PANEL
ALT: 13 U/L (ref 0–44)
AST: 17 U/L (ref 15–41)
Albumin: 3.3 g/dL — ABNORMAL LOW (ref 3.5–5.0)
Alkaline Phosphatase: 51 U/L (ref 38–126)
Anion gap: 6 (ref 5–15)
BUN: 16 mg/dL (ref 8–23)
CO2: 28 mmol/L (ref 22–32)
Calcium: 8.1 mg/dL — ABNORMAL LOW (ref 8.9–10.3)
Chloride: 107 mmol/L (ref 98–111)
Creatinine, Ser: 1.3 mg/dL — ABNORMAL HIGH (ref 0.61–1.24)
GFR, Estimated: >60 mL/min (ref >60)
Glucose, Bld: 232 mg/dL — ABNORMAL HIGH (ref 70–99)
Potassium: 3.3 mmol/L — ABNORMAL LOW (ref 3.5–5.1)
Sodium: 141 mmol/L (ref 135–145)
Total Bilirubin: 1.1 mg/dL (ref 0.3–1.2)
Total Protein: 6.5 g/dL (ref 6.5–8.1)

## 2020-08-28 LAB — GLUCOSE, CAPILLARY
Glucose-Capillary: 229 mg/dL — ABNORMAL HIGH (ref 70–99)
Glucose-Capillary: 210 mg/dL — ABNORMAL HIGH (ref 70–99)
Glucose-Capillary: 155 mg/dL — ABNORMAL HIGH (ref 70–99)
Glucose-Capillary: 181 mg/dL — ABNORMAL HIGH (ref 70–99)

## 2020-08-28 LAB — HEMOGLOBIN A1C
Hgb A1c MFr Bld: 6.4 % — ABNORMAL HIGH (ref 4.8–5.6)
Mean Plasma Glucose: 136.98 mg/dL

## 2020-08-28 MED ORDER — AMLODIPINE BESYLATE 10 MG PO TABS
10.0000 mg | ORAL_TABLET | Freq: Every day | ORAL | Status: DC
  Administered 2020-08-28 – 2020-08-29 (×2): 10 mg via ORAL
  Filled 2020-08-28 (×2): qty 1

## 2020-08-28 MED ORDER — LISINOPRIL 20 MG PO TABS
40.0000 mg | ORAL_TABLET | Freq: Every day | ORAL | Status: DC
  Administered 2020-08-28 – 2020-08-29 (×2): 40 mg via ORAL
  Filled 2020-08-28: qty 2

## 2020-08-28 MED ORDER — ALUM & MAG HYDROXIDE-SIMETH 200-200-20 MG/5ML PO SUSP: 15.0000 mL | ORAL | Status: DC | PRN

## 2020-08-28 MED ORDER — HYDRALAZINE HCL 25 MG PO TABS
25.0000 mg | ORAL_TABLET | Freq: Four times a day (QID) | ORAL | Status: DC | PRN
  Administered 2020-08-28: 25 mg via ORAL
  Filled 2020-08-28: qty 1

## 2020-08-28 MED ORDER — HYDROCHLOROTHIAZIDE 25 MG PO TABS
25.0000 mg | ORAL_TABLET | Freq: Every day | ORAL | Status: DC
  Administered 2020-08-28: 25 mg via ORAL
  Filled 2020-08-28 (×2): qty 1

## 2020-08-28 MED ORDER — TRAZODONE HCL 100 MG PO TABS
100.0000 mg | ORAL_TABLET | Freq: Once | ORAL | Status: AC
  Administered 2020-08-28: 100 mg via ORAL
  Filled 2020-08-28: qty 1

## 2020-08-28 MED ORDER — NEBIVOLOL HCL 10 MG PO TABS
20.0000 mg | ORAL_TABLET | Freq: Every day | ORAL | Status: DC
  Administered 2020-08-28: 20 mg via ORAL
  Filled 2020-08-28: qty 2

## 2020-08-28 MED ORDER — HYDRALAZINE HCL 20 MG/ML IJ SOLN
10.0000 mg | Freq: Once | INTRAMUSCULAR | Status: AC
  Administered 2020-08-28: 10 mg via INTRAVENOUS
  Filled 2020-08-28: qty 1

## 2020-08-28 MED ORDER — LISINOPRIL 20 MG PO TABS: 40.0000 mg | ORAL_TABLET | Freq: Every day | ORAL | Status: DC

## 2020-08-28 MED ORDER — ACETAMINOPHEN 500 MG PO TABS
1000.0000 mg | ORAL_TABLET | Freq: Once | ORAL | Status: AC
  Administered 2020-08-28: 1000 mg via ORAL
  Filled 2020-08-28: qty 2

## 2020-08-28 MED ORDER — AMLODIPINE BESYLATE 10 MG PO TABS: 10.0000 mg | ORAL_TABLET | Freq: Every day | ORAL | Status: DC

## 2020-08-28 MED ORDER — PANTOPRAZOLE SODIUM 40 MG IV SOLR
40.0000 mg | Freq: Two times a day (BID) | INTRAVENOUS | Status: DC
  Administered 2020-08-28 – 2020-08-29 (×3): 40 mg via INTRAVENOUS
  Filled 2020-08-28 (×3): qty 40

## 2020-08-28 MED ORDER — HYDROCHLOROTHIAZIDE 25 MG PO TABS: 25.0000 mg | ORAL_TABLET | Freq: Every day | ORAL | Status: DC

## 2020-08-28 MED ORDER — SODIUM CHLORIDE 0.9 % IV SOLN: 12.5000 mg | Freq: Once | INTRAVENOUS | Status: DC

## 2020-08-28 MED ORDER — DIPHENHYDRAMINE HCL 25 MG PO CAPS
25.0000 mg | ORAL_CAPSULE | Freq: Once | ORAL | Status: AC
  Administered 2020-08-28: 25 mg via ORAL
  Filled 2020-08-28: qty 1

## 2020-08-28 NOTE — Progress Notes (Signed)
Patient ID: Jesse Lane, male   DOB: September 11, 1955, 65 y.o.   MRN: [id number]  PROGRESS NOTE    Jesse Lane  [id number] DOB: 02/26/1956 DOA: 08/27/2020 PCP: Jesse Rasmussen, MD   Brief Narrative:  65 year old male with history of diabetes mellitus type 2, diabetic neuropathy, GERD, essential hypertension, diabetic gastroparesis presented with intractable nausea and vomiting.  On presentation, creatinine was 1.33.  COVID-19 test was negative.  CT of the abdomen and pelvis showed no acute findings.  He was started on IV fluids.  Assessment & Plan:   Diabetic gastroparesis with intractable nausea and vomiting -His PCP had recently increased his Reglan to 10 mg 3 times a day.  Presented with intractable nausea and vomiting -CT of the abdomen and pelvis showed no acute findings.  Continue IV hydration along with around-the-clock Reglan and as needed Zofran. -Feels hungry.  Would want to try liquid diet.  Start clear liquid diet and advance as tolerated -Decrease normal saline to 100 cc an hour  Diabetes mellitus type 2 uncontrolled with hyperglycemia -Continue CBGs with SSI.  Metformin on hold.  Hypertensive urgency -Blood pressure still elevated.  Resume home medications including amlodipine, nebivolol, hydrochlorothiazide and lisinopril.  Monitor  GERD -Use IV Protonix and as needed Maalox.    DVT prophylaxis: Lovenox Code Status: Full Family Communication: None at bedside  disposition Plan: Status is: Inpatient  Remains inpatient appropriate because:Inpatient level of care appropriate due to severity of illness   Dispo: The patient is from: Home              Anticipated d/c is to: Home              Patient currently is not medically stable to d/c.   Difficult to place patient No  Consultants: None  Procedures: None  Antimicrobials: None   Subjective: Patient seen and examined at bedside.  Nausea is improving.  Wants to try some liquid diet.  No overnight  fever or abdominal pain reported.  Objective: Vitals:   08/28/20 0040 08/28/20 0455 08/28/20 0646 08/28/20 0901  BP: (!) 176/106 (!) 214/100 (!) 192/79 (!) 185/95  Pulse: 84 78 74 67  Resp: 20 20 (!) 22 18  Temp: 97.7 F (36.5 C) (!) 97.4 F (36.3 C) 99.5 F (37.5 C) 97.9 F (36.6 C)  TempSrc: Oral Oral Oral Oral  SpO2: 96% 95% 91% 98%  Weight: 90.7 kg     Height: 5\' 11"  (1.803 m)       Intake/Output Summary (Last 24 hours) at 08/28/2020 1048 Last data filed at 08/28/2020 8250 Gross per 24 hour  Intake 1002.56 ml  Output 400 ml  Net 602.56 ml   Filed Weights   08/27/20 1355 08/28/20 0040  Weight: 90.7 kg 90.7 kg    Examination:  General exam: Appears calm and comfortable  Respiratory system: Bilateral decreased breath sounds at bases Cardiovascular system: S1 & S2 heard, Rate controlled Gastrointestinal system: Abdomen is nondistended, soft and nontender. Normal bowel sounds heard. Extremities: No cyanosis, clubbing, edema  Central nervous system: Alert and oriented. No focal neurological deficits. Moving extremities Skin: No rashes, lesions or ulcers Psychiatry: Judgement and insight appear normal. Mood & affect appropriate.     Data Reviewed: I have personally reviewed following labs and imaging studies  CBC: Recent Labs  Lab 08/27/20 1453 08/28/20 0559  WBC 6.0 5.8  NEUTROABS 4.7  --   HGB 12.7* 12.7*  HCT 36.1* 37.0*  MCV 87.8 90.5  PLT 164  956   Basic Metabolic Panel: Recent Labs  Lab 08/27/20 1453 08/28/20 0559  NA 139 141  K 4.5 3.3*  CL 108 107  CO2 27 28  GLUCOSE 130* 232*  BUN 15 16  CREATININE 1.33* 1.30*  CALCIUM 7.9* 8.1*  MG 1.9  --   PHOS 2.8  --    GFR: Estimated Creatinine Clearance: 64.4 mL/min (A) (by C-G formula based on SCr of 1.3 mg/dL (H)). Liver Function Tests: Recent Labs  Lab 08/27/20 1453 08/28/20 0559  AST 52* 17  ALT 21 13  ALKPHOS 51 51  BILITOT 1.0 1.1  PROT 6.6 6.5  ALBUMIN 3.5 3.3*   Recent Labs   Lab 08/27/20 1453  LIPASE 25   No results for input(s): AMMONIA in the last 168 hours. Coagulation Profile: Recent Labs  Lab 08/27/20 1453  INR 1.0   Cardiac Enzymes: No results for input(s): CKTOTAL, CKMB, CKMBINDEX, TROPONINI in the last 168 hours. BNP (last 3 results) No results for input(s): PROBNP in the last 8760 hours. HbA1C: No results for input(s): HGBA1C in the last 72 hours. CBG: Recent Labs  Lab 08/27/20 2152 08/28/20 0837  GLUCAP 215* 229*   Lipid Profile: No results for input(s): CHOL, HDL, LDLCALC, TRIG, CHOLHDL, LDLDIRECT in the last 72 hours. Thyroid Function Tests: No results for input(s): TSH, T4TOTAL, FREET4, T3FREE, THYROIDAB in the last 72 hours. Anemia Panel: No results for input(s): VITAMINB12, FOLATE, FERRITIN, TIBC, IRON, RETICCTPCT in the last 72 hours. Sepsis Labs: Recent Labs  Lab 08/27/20 1453 08/27/20 1619  LATICACIDVEN 1.7 1.0    Recent Results (from the past 240 hour(s))  Resp Panel by RT-PCR (Flu A&B, Covid) Nasopharyngeal Swab     Status: None   Collection Time: 08/27/20  2:55 PM   Specimen: Nasopharyngeal Swab; Nasopharyngeal(NP) swabs in vial transport medium  Result Value Ref Range Status   SARS Coronavirus 2 by RT PCR NEGATIVE NEGATIVE Final    Comment: (NOTE) SARS-CoV-2 target nucleic acids are NOT DETECTED.  The SARS-CoV-2 RNA is generally detectable in upper respiratory specimens during the acute phase of infection. The lowest concentration of SARS-CoV-2 viral copies this assay can detect is 138 copies/mL. A negative result does not preclude SARS-Cov-2 infection and should not be used as the sole basis for treatment or other patient management decisions. A negative result may occur with  improper specimen collection/handling, submission of specimen other than nasopharyngeal swab, presence of viral mutation(s) within the areas targeted by this assay, and inadequate number of viral copies(<138 copies/mL). A negative  result must be combined with clinical observations, patient history, and epidemiological information. The expected result is Negative.  Fact Sheet for Patients:  EntrepreneurPulse.com.au  Fact Sheet for Healthcare Providers:  IncredibleEmployment.be  This test is no t yet approved or cleared by the [place] FDA and  has been authorized for detection and/or diagnosis of SARS-CoV-2 by FDA under an Emergency Use Authorization (EUA). This EUA will remain  in effect (meaning this test can be used) for the duration of the COVID-19 declaration under Section 564(b)(1) of the Act, 21 U.S.C.section 360bbb-3(b)(1), unless the authorization is terminated  or revoked sooner.       Influenza A by PCR NEGATIVE NEGATIVE Final   Influenza B by PCR NEGATIVE NEGATIVE Final    Comment: (NOTE) The Xpert Xpress SARS-CoV-2/FLU/RSV plus assay is intended as an aid in the diagnosis of influenza from Nasopharyngeal swab specimens and should not be used as a sole basis for treatment. Nasal washings  and aspirates are unacceptable for Xpert Xpress SARS-CoV-2/FLU/RSV testing.  Fact Sheet for Patients: EntrepreneurPulse.com.au  Fact Sheet for Healthcare Providers: IncredibleEmployment.be  This test is not yet approved or cleared by the [place] FDA and has been authorized for detection and/or diagnosis of SARS-CoV-2 by FDA under an Emergency Use Authorization (EUA). This EUA will remain in effect (meaning this test can be used) for the duration of the COVID-19 declaration under Section 564(b)(1) of the Act, 21 U.S.C. section 360bbb-3(b)(1), unless the authorization is terminated or revoked.  Performed at [facility], [place] [address] [place], [place] [address]          Radiology Studies: CT Abdomen Pelvis Wo Contrast  Result Date: 08/27/2020 CLINICAL DATA:  Abdominal distension, cramping EXAM:  CT ABDOMEN AND PELVIS WITHOUT CONTRAST TECHNIQUE: Multidetector CT imaging of the abdomen and pelvis was performed following the standard protocol without IV contrast. COMPARISON:  03/21/2020 FINDINGS: Lower chest: No acute abnormality. Hepatobiliary: No focal hepatic abnormality. Gallbladder unremarkable. Pancreas: Pancreatic atrophy. No focal abnormality or ductal dilatation. Spleen: No focal abnormality.  Normal size. Adrenals/Urinary Tract: No renal or adrenal mass. No renal or ureteral stones. No hydronephrosis. Urinary bladder unremarkable. Stomach/Bowel: Sigmoid diverticulosis. No active diverticulitis. Stomach and small bowel decompressed, unremarkable. Appendix visualized, normal. Vascular/Lymphatic: Aortic atherosclerosis. No evidence of aneurysm or adenopathy. Reproductive: No visible focal abnormality. Other: No free fluid or free air. Musculoskeletal: No acute bony abnormality. IMPRESSION: No renal or ureteral stones.  No hydronephrosis. Aortic atherosclerosis. Sigmoid diverticulosis. No acute findings in the abdomen or pelvis. Electronically Signed   By: Rolm Baptise M.D.   On: 08/27/2020 15:48        Scheduled Meds: . amLODipine  10 mg Oral Daily  . enoxaparin (LOVENOX) injection  40 mg Subcutaneous Q24H  . hydrochlorothiazide  25 mg Oral QHS  . insulin aspart  0-5 Units Subcutaneous QHS  . insulin aspart  0-9 Units Subcutaneous TID WC  . lisinopril  40 mg Oral Daily  . metoCLOPramide (REGLAN) injection  10 mg Intravenous Q6H  . nebivolol  20 mg Oral QHS   Continuous Infusions: . sodium chloride 125 mL/hr at 08/28/20 7616          Aline August, MD [facility] 08/28/2020, 10:48 AM

## 2020-08-29 DIAGNOSIS — I160 Hypertensive urgency: Secondary | ICD-10-CM | POA: Diagnosis not present

## 2020-08-29 DIAGNOSIS — E1042 Type 1 diabetes mellitus with diabetic polyneuropathy: Secondary | ICD-10-CM | POA: Diagnosis not present

## 2020-08-29 DIAGNOSIS — N179 Acute kidney failure, unspecified: Secondary | ICD-10-CM | POA: Diagnosis not present

## 2020-08-29 DIAGNOSIS — I10 Essential (primary) hypertension: Secondary | ICD-10-CM

## 2020-08-29 DIAGNOSIS — E1343 Other specified diabetes mellitus with diabetic autonomic (poly)neuropathy: Secondary | ICD-10-CM | POA: Diagnosis not present

## 2020-08-29 LAB — COMPREHENSIVE METABOLIC PANEL
ALT: 16 U/L (ref 0–44)
AST: 22 U/L (ref 15–41)
Albumin: 3.7 g/dL (ref 3.5–5.0)
Alkaline Phosphatase: 56 U/L (ref 38–126)
Anion gap: 11 (ref 5–15)
BUN: 15 mg/dL (ref 8–23)
CO2: 23 mmol/L (ref 22–32)
Calcium: 8.3 mg/dL — ABNORMAL LOW (ref 8.9–10.3)
Chloride: 105 mmol/L (ref 98–111)
Creatinine, Ser: 1.28 mg/dL — ABNORMAL HIGH (ref 0.61–1.24)
GFR, Estimated: >60 mL/min (ref >60)
Glucose, Bld: 290 mg/dL — ABNORMAL HIGH (ref 70–99)
Potassium: 3.5 mmol/L (ref 3.5–5.1)
Sodium: 139 mmol/L (ref 135–145)
Total Bilirubin: 1.9 mg/dL — ABNORMAL HIGH (ref 0.3–1.2)
Total Protein: 6.8 g/dL (ref 6.5–8.1)

## 2020-08-29 LAB — CBC WITH DIFFERENTIAL/PLATELET
Abs Immature Granulocytes: 0.03 10*3/uL (ref 0.00–0.07)
Basophils Absolute: 0 10*3/uL (ref 0.0–0.1)
Basophils Relative: 0 %
Eosinophils Absolute: 0 10*3/uL (ref 0.0–0.5)
Eosinophils Relative: 0 %
HCT: 37.9 % — ABNORMAL LOW (ref 39.0–52.0)
Hemoglobin: 12.9 g/dL — ABNORMAL LOW (ref 13.0–17.0)
Immature Granulocytes: 0 %
Lymphocytes Relative: 14 %
Lymphs Abs: 1.3 10*3/uL (ref 0.7–4.0)
MCH: 30.8 pg (ref 26.0–34.0)
MCHC: 34 g/dL (ref 30.0–36.0)
MCV: 90.5 fL (ref 80.0–100.0)
Monocytes Absolute: 0.4 10*3/uL (ref 0.1–1.0)
Monocytes Relative: 5 %
Neutro Abs: 7.3 10*3/uL (ref 1.7–7.7)
Neutrophils Relative %: 81 %
Platelets: 163 10*3/uL (ref 150–400)
RBC: 4.19 MIL/uL — ABNORMAL LOW (ref 4.22–5.81)
RDW: 13.2 % (ref 11.5–15.5)
WBC: 9.1 10*3/uL (ref 4.0–10.5)
nRBC: 0 % (ref 0.0–0.2)

## 2020-08-29 LAB — MAGNESIUM: Magnesium: 1.8 mg/dL (ref 1.7–2.4)

## 2020-08-29 LAB — GLUCOSE, CAPILLARY: Glucose-Capillary: 289 mg/dL — ABNORMAL HIGH (ref 70–99)

## 2020-08-29 MED ORDER — ALUM & MAG HYDROXIDE-SIMETH 200-200-20 MG/5ML PO SUSP: 15.0000 mL | ORAL | 0 refills | Status: DC | PRN

## 2020-08-29 MED ORDER — METOCLOPRAMIDE HCL 10 MG PO TABS: 10.0000 mg | ORAL_TABLET | Freq: Three times a day (TID) | ORAL | 0 refills | Status: DC

## 2020-08-29 MED ORDER — ONDANSETRON HCL 4 MG PO TABS: 4.0000 mg | ORAL_TABLET | Freq: Four times a day (QID) | ORAL | 0 refills | Status: DC | PRN

## 2020-08-29 NOTE — Discharge Summary (Signed)
Physician Discharge Summary  Jesse Lane [id number] DOB: 11/22/1955 DOA: 08/27/2020  PCP: [name], MD  Admit date: 08/27/2020 Discharge date: 08/29/2020  Admitted From: Home Disposition: Home  Recommendations for Outpatient Follow-up:  1. Follow up with PCP in 1 week with repeat CBC/BMP 2. Follow up in ED if symptoms worsen or new appear   Home Health: No Equipment/Devices: None  Discharge Condition: Stable CODE STATUS: Full Diet recommendation: Heart healthy  Brief/Interim Summary: 65 year old male with history of diabetes mellitus type 2, diabetic neuropathy, GERD, essential hypertension, diabetic gastroparesis presented with intractable nausea and vomiting.  On presentation, creatinine was 1.33.  COVID-19 test was negative.  CT of the abdomen and pelvis showed no acute findings.  He was started on IV fluids.  During the hospitalization, his condition has improved.  He was started on clear liquid diet and diet has been advanced.  He has tolerated solid diet this morning and wants to go home.  He will be discharged home today on Reglan 10 mg 4 times a day till reevaluation by PCP and/or GI.  Discharge Diagnoses:  Diabetic gastroparesis with intractable nausea and vomiting -His PCP had recently increased his Reglan to 10 mg 3 times a day.  Presented with intractable nausea and vomiting -CT of the abdomen and pelvis showed no acute findings.   -Treated with IV fluids and round-the-clock IV Reglan and as needed Zofran - He was started on clear liquid diet and diet has been advanced.  He has tolerated solid diet this morning and wants to go home.  He will be discharged home today on Reglan 10 mg 4 times a day till reevaluation by PCP and/or GI.  Diabetes mellitus type 2 uncontrolled with hyperglycemia -Carb modified diet.  Resume home regimen  Hypertensive urgency -Blood pressure still elevated.  Continue home regimen.  Outpatient follow-up with  PCP.  GERD -Continue Protonix and use as needed Maalox.   Discharge Instructions  Discharge Instructions    Ambulatory referral to Gastroenterology   Complete by: As directed    Hospital followup   Diet - low sodium heart healthy   Complete by: As directed    Diet Carb Modified   Complete by: As directed    Increase activity slowly   Complete by: As directed      Allergies as of 08/29/2020      Reactions   Contrast Media [iodinated Diagnostic Agents] Itching      Medication List    TAKE these medications   acetaminophen 500 MG tablet Commonly known as: TYLENOL Take 1,000 mg by mouth 2 (two) times daily as needed for mild pain or headache (sleep).   alum & mag hydroxide-simeth 200-200-20 MG/5ML suspension Commonly known as: MAALOX/MYLANTA Take 15 mLs by mouth every 4 (four) hours as needed for indigestion or heartburn.   amLODipine 10 MG tablet Commonly known as: NORVASC Take 10 mg by mouth daily.   aspirin 81 MG EC tablet Take 1 tablet (81 mg total) by mouth daily. Swallow whole.   atorvastatin 40 MG tablet Commonly known as: LIPITOR Take 1 tablet (40 mg total) by mouth daily.   Bystolic 20 MG Tabs Generic drug: Nebivolol HCl Take 20 mg by mouth at bedtime.   Dexcom G6 Receiver Devi by Does not apply route.   Dexcom G6 Sensor Misc by Does not apply route.   Dexcom G6 Transmitter Misc by Does not apply route.   hydrochlorothiazide 25 MG tablet Commonly known as: HYDRODIURIL Take 25 mg  by mouth at bedtime.   insulin lispro 100 UNIT/ML KwikPen Commonly known as: HUMALOG Inject 10-12 Units into the skin 3 (three) times daily. Injection 10-12 units three times daily before meals as needed: for CBG 180-200 10 units, >200 12 units . Sliding scale   Insulin Pen Needle 32G X 6 MM Misc Commonly known as: BD Pen Needle Micro U/F 1 each by Other route See admin instructions. Use one pen needle to inject insulin 4 times daily. **must have appointment for  additional refills.** What changed: Another medication with the same name was changed. Make sure you understand how and when to take each.   Insulin Pen Needle 32G X 6 MM Misc Commonly known as: BD Pen Needle Micro U/F USE 1 PEN NEEDLE SIX TIMES DAILY What changed:   how much to take  how to take this  when to take this   lisinopril 40 MG tablet Commonly known as: ZESTRIL Take 40 mg by mouth at bedtime.   metoCLOPramide 10 MG tablet Commonly known as: REGLAN Take 1 tablet (10 mg total) by mouth 4 (four) times daily -  before meals and at bedtime. What changed: when to take this   montelukast 10 MG tablet Commonly known as: SINGULAIR Take 10 mg by mouth daily.   ondansetron 4 MG tablet Commonly known as: ZOFRAN Take 1 tablet (4 mg total) by mouth every 6 (six) hours as needed for nausea.   pantoprazole 40 MG tablet Commonly known as: PROTONIX Take 1 tablet by mouth twice daily What changed: when to take this   pregabalin 100 MG capsule Commonly known as: LYRICA Take 100-300 mg by mouth See admin instructions. Take 2 capsule (200 mg) by mouth every morning and take three capsules (300 mg) at bedtime   sucralfate 1 GM/10ML suspension Commonly known as: CARAFATE Take 2 g by mouth 3 (three) times daily.   traZODone 100 MG tablet Commonly known as: DESYREL Take 100 mg by mouth at bedtime.   Tyler Aas FlexTouch 100 UNIT/ML FlexTouch Pen Generic drug: insulin degludec Inject 24 Units into the skin daily before breakfast.       Follow-up Information    [name], MD. Schedule an appointment as soon as possible for a visit in 1 week(s).   Specialty: Family Medicine Why: with repeat cbc/cmp Contact information: [address]  [place] [address] [phone]              Allergies  Allergen Reactions  . Contrast Media [Iodinated Diagnostic Agents] Itching    Consultations:  None   Procedures/Studies: CT Abdomen Pelvis Wo  Contrast  Result Date: 08/27/2020 CLINICAL DATA:  Abdominal distension, cramping EXAM: CT ABDOMEN AND PELVIS WITHOUT CONTRAST TECHNIQUE: Multidetector CT imaging of the abdomen and pelvis was performed following the standard protocol without IV contrast. COMPARISON:  03/21/2020 FINDINGS: Lower chest: No acute abnormality. Hepatobiliary: No focal hepatic abnormality. Gallbladder unremarkable. Pancreas: Pancreatic atrophy. No focal abnormality or ductal dilatation. Spleen: No focal abnormality.  Normal size. Adrenals/Urinary Tract: No renal or adrenal mass. No renal or ureteral stones. No hydronephrosis. Urinary bladder unremarkable. Stomach/Bowel: Sigmoid diverticulosis. No active diverticulitis. Stomach and small bowel decompressed, unremarkable. Appendix visualized, normal. Vascular/Lymphatic: Aortic atherosclerosis. No evidence of aneurysm or adenopathy. Reproductive: No visible focal abnormality. Other: No free fluid or free air. Musculoskeletal: No acute bony abnormality. IMPRESSION: No renal or ureteral stones.  No hydronephrosis. Aortic atherosclerosis. Sigmoid diverticulosis. No acute findings in the abdomen or pelvis. Electronically Signed  By: Rolm Baptise M.D.   On: 08/27/2020 15:48       Subjective: Patient seen and examined at bedside.  He is tolerating diet and wants to go home today.  No overnight fever, worsening vomiting reported.  Denies worsening shortness of breath.  Discharge Exam: Vitals:   08/29/20 1005 08/29/20 1052  BP: (!) 175/91 (!) 176/83  Pulse: 66 62  Resp:    Temp:    SpO2:      General: Pt is alert, awake, not in acute distress Cardiovascular: rate controlled, S1/S2 + Respiratory: bilateral decreased breath sounds at bases Abdominal: Soft, NT, ND, bowel sounds + Extremities: no edema, no cyanosis    The results of significant diagnostics from this hospitalization (including imaging, microbiology, ancillary and laboratory) are listed below for reference.      Microbiology: Recent Results (from the past 240 hour(s))  Resp Panel by RT-PCR (Flu A&B, Covid) Nasopharyngeal Swab     Status: None   Collection Time: 08/27/20  2:55 PM   Specimen: Nasopharyngeal Swab; Nasopharyngeal(NP) swabs in vial transport medium  Result Value Ref Range Status   SARS Coronavirus 2 by RT PCR NEGATIVE NEGATIVE Final    Comment: (NOTE) SARS-CoV-2 target nucleic acids are NOT DETECTED.  The SARS-CoV-2 RNA is generally detectable in upper respiratory specimens during the acute phase of infection. The lowest concentration of SARS-CoV-2 viral copies this assay can detect is 138 copies/mL. A negative result does not preclude SARS-Cov-2 infection and should not be used as the sole basis for treatment or other patient management decisions. A negative result may occur with  improper specimen collection/handling, submission of specimen other than nasopharyngeal swab, presence of viral mutation(s) within the areas targeted by this assay, and inadequate number of viral copies(<138 copies/mL). A negative result must be combined with clinical observations, patient history, and epidemiological information. The expected result is Negative.  Fact Sheet for Patients:  EntrepreneurPulse.com.au  Fact Sheet for Healthcare Providers:  IncredibleEmployment.be  This test is no t yet approved or cleared by the [place] FDA and  has been authorized for detection and/or diagnosis of SARS-CoV-2 by FDA under an Emergency Use Authorization (EUA). This EUA will remain  in effect (meaning this test can be used) for the duration of the COVID-19 declaration under Section 564(b)(1) of the Act, 21 U.S.C.section 360bbb-3(b)(1), unless the authorization is terminated  or revoked sooner.       Influenza A by PCR NEGATIVE NEGATIVE Final   Influenza B by PCR NEGATIVE NEGATIVE Final    Comment: (NOTE) The Xpert Xpress SARS-CoV-2/FLU/RSV plus assay is  intended as an aid in the diagnosis of influenza from Nasopharyngeal swab specimens and should not be used as a sole basis for treatment. Nasal washings and aspirates are unacceptable for Xpert Xpress SARS-CoV-2/FLU/RSV testing.  Fact Sheet for Patients: EntrepreneurPulse.com.au  Fact Sheet for Healthcare Providers: IncredibleEmployment.be  This test is not yet approved or cleared by the [place] FDA and has been authorized for detection and/or diagnosis of SARS-CoV-2 by FDA under an Emergency Use Authorization (EUA). This EUA will remain in effect (meaning this test can be used) for the duration of the COVID-19 declaration under Section 564(b)(1) of the Act, 21 U.S.C. section 360bbb-3(b)(1), unless the authorization is terminated or revoked.  Performed at [facility], [place] [address] [place], [place] [address]      Labs: BNP (last 3 results) No results for input(s): BNP in the last 8760 hours. Basic Metabolic Panel: Recent Labs  Lab 08/27/20 1453 08/28/20 0559 08/29/20 0603  NA 139 141 139  K 4.5 3.3* 3.5  CL 108 107 105  CO2 27 28 23   GLUCOSE 130* 232* 290*  BUN 15 16 15   CREATININE 1.33* 1.30* 1.28*  CALCIUM 7.9* 8.1* 8.3*  MG 1.9  --  1.8  PHOS 2.8  --   --    Liver Function Tests: Recent Labs  Lab 08/27/20 1453 08/28/20 0559 08/29/20 0603  AST 52* 17 22  ALT 21 13 16   ALKPHOS 51 51 56  BILITOT 1.0 1.1 1.9*  PROT 6.6 6.5 6.8  ALBUMIN 3.5 3.3* 3.7   Recent Labs  Lab 08/27/20 1453  LIPASE 25   No results for input(s): AMMONIA in the last 168 hours. CBC: Recent Labs  Lab 08/27/20 1453 08/28/20 0559 08/29/20 0603  WBC 6.0 5.8 9.1  NEUTROABS 4.7  --  7.3  HGB 12.7* 12.7* 12.9*  HCT 36.1* 37.0* 37.9*  MCV 87.8 90.5 90.5  PLT 164 160 163   Cardiac Enzymes: No results for input(s): CKTOTAL, CKMB, CKMBINDEX, TROPONINI in the last 168 hours. BNP: Invalid input(s): POCBNP CBG: Recent  Labs  Lab 08/28/20 0837 08/28/20 1150 08/28/20 1722 08/28/20 2045 08/29/20 0741  GLUCAP 229* 210* 155* 181* 289*   D-Dimer No results for input(s): DDIMER in the last 72 hours. Hgb A1c Recent Labs    08/28/20 0559  HGBA1C 6.4*   Lipid Profile No results for input(s): CHOL, HDL, LDLCALC, TRIG, CHOLHDL, LDLDIRECT in the last 72 hours. Thyroid function studies No results for input(s): TSH, T4TOTAL, T3FREE, THYROIDAB in the last 72 hours.  Invalid input(s): FREET3 Anemia work up No results for input(s): VITAMINB12, FOLATE, FERRITIN, TIBC, IRON, RETICCTPCT in the last 72 hours. Urinalysis    Component Value Date/Time   COLORURINE YELLOW 08/27/2020 1930   APPEARANCEUR CLEAR 08/27/2020 1930   LABSPEC 1.023 08/27/2020 1930   PHURINE 5.0 08/27/2020 1930   GLUCOSEU 50 (A) 08/27/2020 1930   HGBUR MODERATE (A) 08/27/2020 1930   BILIRUBINUR NEGATIVE 08/27/2020 1930   KETONESUR NEGATIVE 08/27/2020 1930   PROTEINUR >=300 (A) 08/27/2020 1930   UROBILINOGEN 0.2 12/05/2011 1655   NITRITE NEGATIVE 08/27/2020 1930   LEUKOCYTESUR NEGATIVE 08/27/2020 1930   Sepsis Labs Invalid input(s): PROCALCITONIN,  WBC,  LACTICIDVEN Microbiology Recent Results (from the past 240 hour(s))  Resp Panel by RT-PCR (Flu A&B, Covid) Nasopharyngeal Swab     Status: None   Collection Time: 08/27/20  2:55 PM   Specimen: Nasopharyngeal Swab; Nasopharyngeal(NP) swabs in vial transport medium  Result Value Ref Range Status   SARS Coronavirus 2 by RT PCR NEGATIVE NEGATIVE Final    Comment: (NOTE) SARS-CoV-2 target nucleic acids are NOT DETECTED.  The SARS-CoV-2 RNA is generally detectable in upper respiratory specimens during the acute phase of infection. The lowest concentration of SARS-CoV-2 viral copies this assay can detect is 138 copies/mL. A negative result does not preclude SARS-Cov-2 infection and should not be used as the sole basis for treatment or other patient management decisions. A negative  result may occur with  improper specimen collection/handling, submission of specimen other than nasopharyngeal swab, presence of viral mutation(s) within the areas targeted by this assay, and inadequate number of viral copies(<138 copies/mL). A negative result must be combined with clinical observations, patient history, and epidemiological information. The expected result is Negative.  Fact Sheet for Patients:  EntrepreneurPulse.com.au  Fact Sheet for Healthcare Providers:  IncredibleEmployment.be  This test is no t yet approved or cleared  by the [place] and  has been authorized for detection and/or diagnosis of SARS-CoV-2 by FDA under an Emergency Use Authorization (EUA). This EUA will remain  in effect (meaning this test can be used) for the duration of the COVID-19 declaration under Section 564(b)(1) of the Act, 21 U.S.C.section 360bbb-3(b)(1), unless the authorization is terminated  or revoked sooner.       Influenza A by PCR NEGATIVE NEGATIVE Final   Influenza B by PCR NEGATIVE NEGATIVE Final    Comment: (NOTE) The Xpert Xpress SARS-CoV-2/FLU/RSV plus assay is intended as an aid in the diagnosis of influenza from Nasopharyngeal swab specimens and should not be used as a sole basis for treatment. Nasal washings and aspirates are unacceptable for Xpert Xpress SARS-CoV-2/FLU/RSV testing.  Fact Sheet for Patients: EntrepreneurPulse.com.au  Fact Sheet for Healthcare Providers: IncredibleEmployment.be  This test is not yet approved or cleared by the [place] FDA and has been authorized for detection and/or diagnosis of SARS-CoV-2 by FDA under an Emergency Use Authorization (EUA). This EUA will remain in effect (meaning this test can be used) for the duration of the COVID-19 declaration under Section 564(b)(1) of the Act, 21 U.S.C. section 360bbb-3(b)(1), unless the authorization is  terminated or revoked.  Performed at [facility], [place] [address] [place], [place] [address]      Time coordinating discharge: 35 minutes  SIGNED:   Aline August, MD  [facility] 08/29/2020, 11:04 AM

## 2020-08-29 NOTE — Progress Notes (Addendum)
Inpatient Diabetes Program Recommendations  AACE/ADA: New Consensus Statement on Inpatient Glycemic Control (2015)  Target Ranges:  Prepandial:   less than 140 mg/dL      Peak postprandial:   less than 180 mg/dL (1-2 hours)      Critically ill patients:  140 - 180 mg/dL   Results for [name], [name] (MRN [id number]) as of 08/29/2020 10:19  Ref. Range 08/28/2020 08:37 08/28/2020 11:50 08/28/2020 17:22 08/28/2020 20:45  Glucose-Capillary Latest Ref Range: 70 - 99 mg/dL 229 (H)  3 units NOVOLOG  210 (H)  3 units NOVOLOG  155 (H)  2 units NOVOLOG  181 (H)   Results for [name], [name] (MRN [id number]) as of 08/29/2020 10:19  Ref. Range 08/29/2020 07:41  Glucose-Capillary Latest Ref Range: 70 - 99 mg/dL 289 (H)  5 units NOVOLOG    Results for [name], [name] (MRN [id number]) as of 08/29/2020 10:19  Ref. Range 08/28/2020 05:59  Hemoglobin A1C Latest Ref Range: 4.8 - 5.6 % 6.4 (H)    Admit with: Diabetic gastroparesis with intractable nausea and vomiting  History: DM  Home DM Meds: Tresiba 24 units Daily       Humalog 10-12 units TID  Current Orders: Novolog Sensitive Correction Scale/ SSI (0-9 units) TID AC + HS    MD- Note patient takes [place] insulin at home  Please consider starting Lantus 16 units Daily  70% total home dose    --Will follow patient during hospitalization--  Wyn Quaker RN, MSN, CDE Diabetes Coordinator Inpatient Glycemic Control Team Team Pager: [phone] (8a-5p)

## 2020-08-29 NOTE — Progress Notes (Signed)
Patients blood pressure was noted to be 176/83. Okay to d/c patient per Dr. Starla Link. Patient does have blood pressure medications at home. He does have a post-hospitalization follow-up appointment. Pt was advised to go to appointment and get blood pressure rechecked.

## 2020-09-21 ENCOUNTER — Ambulatory Visit (INDEPENDENT_AMBULATORY_CARE_PROVIDER_SITE_OTHER): Payer: Medicare Other | Admitting: Physician Assistant

## 2020-09-21 ENCOUNTER — Encounter: Payer: Self-pay | Admitting: Physician Assistant

## 2020-09-21 VITALS — BP 128/72 | HR 61 | Ht 71.0 in | Wt 203.6 lb

## 2020-09-21 DIAGNOSIS — K3184 Gastroparesis: Secondary | ICD-10-CM | POA: Diagnosis not present

## 2020-09-21 MED ORDER — ONDANSETRON HCL 4 MG PO TABS: 4.0000 mg | ORAL_TABLET | Freq: Four times a day (QID) | ORAL | 5 refills | Status: DC | PRN

## 2020-09-21 NOTE — Progress Notes (Signed)
Chief Complaint: Follow-up after hospitalization for nausea and vomiting  HPI:    [name] is a 65 year old male with a past medical history as listed below including diabetes, stroke 10/16/2019 with LVEF 65-70%, stage IV malignant melanoma with lymph node metastasis and gastroparesis, known to Dr. Ardis Hughs, who presents to clinic today for follow-up after recent hospitalization for nausea and vomiting.    08/27/2019 CT the chest abdomen pelvis with contrast which showed no evidence of recurrent metastatic disease in the chest, abdomen or pelvis.  Stomach and bowel were normal.    10/16/2019-10/18/2019 patient admitted to hospital for encephalopathy due to AKI and hypertension as well as strokelike symptoms treated with IV TPA.  He was started on aspirin at discharge.  Apparently at that time discussed a history of gastric ulcers, patient was on Carafate, Prevacid and Prilosec at some point.  It was recommended he follow-up with [place].    10/17/2019 hemoglobin A1c 6.4.    12/09/2019 patient seen in clinic by me for epigastric discomfort and nausea with a history of gastric ulcers.  At that time patient discussed that he had not had a stroke recently and just presented as one after kidney failure from IV contrast.  At that time was cancer free for 2 years but continued with some nausea and episodes of vomiting.  He was scheduled for an EGD and started on pantoprazole 40 twice daily.  Also continued sucralfate.    02/15/2020 colonoscopy with diverticulosis in the entire colon, 5 2-6 mm polyps in sigmoid, transverse and descending colon and otherwise normal.  Pathology showed adenomatous polyps and repeat was recommended in 5 years.    02/15/2020 EGD with short segment, nonnodular Barrett's change classified as Barrett's stage C1-M2, gastritis and otherwise normal.  Repeat was recommended in 3 years.    06/07/2020 patient seen in clinic and described for the past couple of years he would have episodes about a week out  of the month.  Have no energy no appetite and feels very full very fast and bloated.  Ordered a gastric emptying study for further evaluation continue pantoprazole and sucralfate.  Explained that if the gastric emptying study was normal then could consider SIBO testing.    07/03/2020 gastric emptying study showed delayed gastric emptying.  He was started on Reglan 5 mg twice daily 20 to 30 minutes before breakfast and then again before dinner.    07/26/2020 patient seen in clinic and described that he had not had any severe episodes of his gastroparesis in the past past month since being on Reglan 5 mg twice daily.  He had not been needing his Carafate or pantoprazole on a regular basis.  At that time patient wanted to try a small increase in frequency of medicine to see if it helped with bloating.  Increased Reglan to 5 mg 3 times daily.    08/27/2020-08/29/2020 patient hospitalized after presenting with intractable nausea and vomiting.  At time of discharge his Reglan was increased to 10 mg 4 times daily.  CT of the abdomen pelvis without contrast during admission showed no renal or ureteral stones, aortic atherosclerosis, sigmoid diverticulosis and no acute findings in the abdomen or pelvis.    Today, the patient tells me that his symptoms are completely better.  It all seemed to hit him within a 2 to 3-day time span and got to the point where he was walking out the door and just started vomiting up his breakfast from earlier that day.  That  is when they went to the ER.  Since being in the ER he is now taking his Metoclopramide 10 mg 3 times daily, before breakfast and dinner and then again at bedtime.  This regimen seems to be working well for him and he has no further complaints or concerns.  Does tell me that they gave him some Zofran in the hospital and he would like to have some of this on hand just in case.    Denies fever, chills, weight loss or continued symptoms.  Past Medical History:  Diagnosis  Date   Arthritis    Diabetes mellitus type 2, uncontrolled (HCC)    Diabetic neuropathy (HCC)    GERD (gastroesophageal reflux disease)    History of stomach ulcers    Hypertension    Melanoma ([place])    mets to lymph node and intestines    Past Surgical History:  Procedure Laterality Date   ABCESS DRAINAGE     gluteal area   COLONOSCOPY     INGUINAL HERNIA REPAIR N/A 08/15/2015   Procedure: HERNIA REPAIR INGUINAL INCARCERATED;  Surgeon: Alphonsa Overall, MD;  Location: [facility];  Service: General;  Laterality: N/A;  with MESH   pyloric stenosis     TONSILLECTOMY      Current Outpatient Medications  Medication Sig Dispense Refill   acetaminophen (TYLENOL) 500 MG tablet Take 1,000 mg by mouth 2 (two) times daily as needed for mild pain or headache (sleep).      alum & mag hydroxide-simeth (MAALOX/MYLANTA) 200-200-20 MG/5ML suspension Take 15 mLs by mouth every 4 (four) hours as needed for indigestion or heartburn. 355 mL 0   amLODipine (NORVASC) 10 MG tablet Take 10 mg by mouth daily.     aspirin EC 81 MG EC tablet Take 1 tablet (81 mg total) by mouth daily. Swallow whole. (Patient not taking: No sig reported) 30 tablet 11   atorvastatin (LIPITOR) 40 MG tablet Take 1 tablet (40 mg total) by mouth daily. 30 tablet 1   Continuous Blood Gluc Receiver ([place]) DEVI by Does not apply route.     Continuous Blood Gluc Sensor (DEXCOM G6 SENSOR) MISC by Does not apply route.     Continuous Blood Gluc Transmit (DEXCOM G6 TRANSMITTER) MISC by Does not apply route.     hydrochlorothiazide (HYDRODIURIL) 25 MG tablet Take 25 mg by mouth at bedtime.     insulin degludec (TRESIBA FLEXTOUCH) 100 UNIT/ML FlexTouch Pen Inject 24 Units into the skin daily before breakfast.     insulin lispro (HUMALOG) 100 UNIT/ML KwikPen Inject 10-12 Units into the skin 3 (three) times daily. Injection 10-12 units three times daily before meals as needed: for CBG 180-200 10 units, >200 12 units . Sliding scale      Insulin Pen Needle (BD PEN NEEDLE MICRO U/F) 32G X 6 MM MISC 1 each by Other route See admin instructions. Use one pen needle to inject insulin 4 times daily. **must have appointment for additional refills.** 150 each 0   Insulin Pen Needle (BD PEN NEEDLE MICRO U/F) 32G X 6 MM MISC USE 1 PEN NEEDLE SIX TIMES DAILY (Patient taking differently: 1 each by Other route See admin instructions. USE 1 PEN NEEDLE SIX TIMES DAILY) 200 each 2   lisinopril (PRINIVIL,ZESTRIL) 40 MG tablet Take 40 mg by mouth at bedtime.   0   metoCLOPramide (REGLAN) 10 MG tablet Take 1 tablet (10 mg total) by mouth 4 (four) times daily -  before meals and at  bedtime. 60 tablet 0   montelukast (SINGULAIR) 10 MG tablet Take 10 mg by mouth daily.     Nebivolol HCl (BYSTOLIC) 20 MG TABS Take 20 mg by mouth at bedtime.     ondansetron (ZOFRAN) 4 MG tablet Take 1 tablet (4 mg total) by mouth every 6 (six) hours as needed for nausea. 20 tablet 0   pantoprazole (PROTONIX) 40 MG tablet Take 1 tablet by mouth twice daily (Patient taking differently: Take 40 mg by mouth daily.) 60 tablet 5   pregabalin (LYRICA) 100 MG capsule Take 100-300 mg by mouth See admin instructions. Take 2 capsule (200 mg) by mouth every morning and take three capsules (300 mg) at bedtime     sucralfate (CARAFATE) 1 GM/10ML suspension Take 2 g by mouth 3 (three) times daily.     traZODone (DESYREL) 100 MG tablet Take 100 mg by mouth at bedtime.  0   No current facility-administered medications for this visit.    Allergies as of 09/21/2020 - Review Complete 08/28/2020  Allergen Reaction Noted   Contrast media [iodinated diagnostic agents] Itching 10/12/2019    Family History  Problem Relation Age of Onset   Breast cancer Mother    Bone cancer Mother    Melanoma Mother    Prostate cancer Father    Pancreatic cancer Maternal Grandfather    Diabetes Paternal Aunt    Colon cancer Neg Hx    Colon polyps Neg Hx    Esophageal cancer Neg Hx    Stomach cancer  Neg Hx     Social History   Socioeconomic History   Marital status: Single    Spouse name: Not on file   Number of children: 1   Years of education: Not on file   Highest education level: Not on file  Occupational History   Occupation: retired  Tobacco Use   Smoking status: Former    Packs/day: 1.00    Years: 5.00    Pack years: 5.00    Types: Cigarettes   Smokeless tobacco: Never  Vaping Use   Vaping Use: Never used  Substance and Sexual Activity   Alcohol use: Yes    Alcohol/week: 2.0 standard drinks    Types: 2 Glasses of wine per week    Comment: 375 ml of red wine daily   Drug use: Not Currently    Types: Marijuana    Comment: daily   Sexual activity: Never  Other Topics Concern   Not on file  Social History Narrative   Not on file   Social Determinants of Health   Financial Resource Strain: Not on file  Food Insecurity: Not on file  Transportation Needs: Not on file  Physical Activity: Not on file  Stress: Not on file  Social Connections: Not on file  Intimate Partner Violence: Not on file    Review of Systems:    Constitutional: No weight loss, fever or chills Cardiovascular: No chest pain Respiratory: No SOB  Gastrointestinal: See HPI and otherwise negative   Physical Exam:  Vital signs: BP 128/72   Pulse 61   Ht 5\' 11"  (1.803 m)   Wt 203 lb 9.6 oz (92.4 kg)   SpO2 95%   BMI 28.40 kg/m    Constitutional:   Pleasant Caucasian male appears to be in NAD, Well developed, Well nourished, alert and cooperative Respiratory: Respirations even and unlabored. Lungs clear to auscultation bilaterally.   No wheezes, crackles, or rhonchi.  Cardiovascular: Normal S1, S2. No MRG. Regular rate  and rhythm. No peripheral edema, cyanosis or pallor.  Gastrointestinal:  Soft, nondistended, nontender. No rebound or guarding. Normal bowel sounds. No appreciable masses or hepatomegaly. Rectal:  Not performed.  Psychiatric: Oriented to person, place and time.  Demonstrates good judgement and reason without abnormal affect or behaviors.  RELEVANT LABS AND IMAGING: CBC    Component Value Date/Time   WBC 9.1 08/29/2020 0603   RBC 4.19 (L) 08/29/2020 0603   HGB 12.9 (L) 08/29/2020 0603   HCT 37.9 (L) 08/29/2020 0603   PLT 163 08/29/2020 0603   MCV 90.5 08/29/2020 0603   MCH 30.8 08/29/2020 0603   MCHC 34.0 08/29/2020 0603   RDW 13.2 08/29/2020 0603   LYMPHSABS 1.3 08/29/2020 0603   MONOABS 0.4 08/29/2020 0603   EOSABS 0.0 08/29/2020 0603   BASOSABS 0.0 08/29/2020 0603    CMP     Component Value Date/Time   NA 139 08/29/2020 0603   K 3.5 08/29/2020 0603   CL 105 08/29/2020 0603   CO2 23 08/29/2020 0603   GLUCOSE 290 (H) 08/29/2020 0603   BUN 15 08/29/2020 0603   CREATININE 1.28 (H) 08/29/2020 0603   CALCIUM 8.3 (L) 08/29/2020 0603   PROT 6.8 08/29/2020 0603   ALBUMIN 3.7 08/29/2020 0603   AST 22 08/29/2020 0603   ALT 16 08/29/2020 0603   ALKPHOS 56 08/29/2020 0603   BILITOT 1.9 (H) 08/29/2020 0603   GFRNONAA >60 08/29/2020 0603   GFRAA >60 10/18/2019 0339    Assessment: 1.  Gastroparesis: Recent exacerbation and hospitalization, better now with increase in Metoclopramide to 10 mg 3 times daily  Plan: 1.  Continue Metoclopramide 10 mg 3 times daily.  Did discuss with the patient starts with abdominal discomfort or symptoms he can try increasing to 4 doses a day for a while to see if this keeps him out of the hospital.  He verbalized understanding and does not feel like he needs refills today. 2.  Sent in a prescription for Zofran 4 mg every 4-6 hours as needed for refractory nausea.  #30 with 5 refills.  Did discuss that this medication can slow his GI system down so I do not advise that he use it on a regular basis but instead titrate his Metoclopramide as above. 3.  Patient to follow in clinic with [place] as needed in the future.  Ellouise Newer, PA-C [facility] 09/21/2020, 1:42 PM  Cc: [name], MD

## 2020-09-21 NOTE — Patient Instructions (Signed)
If you are age 66 or older, your body mass index should be between 23-30. Your Body mass index is 28.4 kg/m. If this is out of the aforementioned range listed, please consider follow up with your Primary Care Provider.  If you are age 72 or younger, your body mass index should be between 19-25. Your Body mass index is 28.4 kg/m. If this is out of the aformentioned range listed, please consider follow up with your Primary Care Provider.   We have sent the following medications to your pharmacy for you to pick up at your convenience:  Zofran 4 mg   The  GI providers would like to encourage you to use [facility] to communicate with providers for non-urgent requests or questions.  Due to long hold times on the telephone, sending your provider a message by [facility] may be a faster and more efficient way to get a response.  Please allow 48 business hours for a response.  Please remember that this is for non-urgent requests.   Thank you for choosing me and [facility].  Ellouise Newer, PA-C

## 2020-09-25 NOTE — Progress Notes (Signed)
I agree with the above note, plan 

## 2020-10-28 ENCOUNTER — Other Ambulatory Visit: Payer: Self-pay

## 2020-10-28 ENCOUNTER — Telehealth: Payer: Self-pay | Admitting: Physician Assistant

## 2020-10-28 MED ORDER — METOCLOPRAMIDE HCL 10 MG PO TABS: 10.0000 mg | ORAL_TABLET | Freq: Three times a day (TID) | ORAL | 3 refills | Status: DC

## 2020-10-28 NOTE — Telephone Encounter (Signed)
Refill sent to patients pharmacy. 

## 2020-10-28 NOTE — Telephone Encounter (Signed)
Patient called requesting refill on Reglan TID

## 2020-11-28 ENCOUNTER — Telehealth: Payer: Self-pay | Admitting: Physician Assistant

## 2020-11-28 NOTE — Telephone Encounter (Signed)
Lm on vm for patient to return call 

## 2020-11-28 NOTE — Telephone Encounter (Signed)
Inbound call from patient requesting a call from a nurse please.  States he has been felling shaky lately and wants to know if it may be due to the medication that was prescribed at his last visit.  Please advise.

## 2020-11-29 NOTE — Telephone Encounter (Signed)
Spoke with patient, he states that 4 days ago he noticed that his (R) arm was shaking really bad, he states that it is mainly his hand. He stopped the Reglan 3 days ago. Pt states that he has been on the medication for about 5 months now. Patient is taking Reglan 10 mg TID. Pt states states that he did not notice much of a change in his symptoms. Patient is wondering if he needs to try alternative medication. Please advise, thanks.

## 2020-11-29 NOTE — Telephone Encounter (Signed)
I think he needs to be seen in the office until then he should stop taking Reglan.  It's weird, when he was in the office and he saw him 2 months ago it sounds like he was doing quite well on his Reglan actually.

## 2020-11-29 NOTE — Telephone Encounter (Signed)
Spoke with patient in regards to recommendations. He has been scheduled for a follow up with Ellouise Newer, PA-C on Thursday, 12/29/20 at 3 PM. Pt will continue to hold Reglan until appt, pt has been added to appt wait list. Patient verbalized understanding and had no concerns at the end of the call.

## 2020-12-29 ENCOUNTER — Telehealth: Payer: Self-pay | Admitting: Physician Assistant

## 2020-12-29 ENCOUNTER — Ambulatory Visit: Payer: Medicare Other | Admitting: Physician Assistant

## 2020-12-29 DIAGNOSIS — K3184 Gastroparesis: Secondary | ICD-10-CM

## 2020-12-29 DIAGNOSIS — K5900 Constipation, unspecified: Secondary | ICD-10-CM

## 2020-12-29 NOTE — Telephone Encounter (Signed)
Inbound from pt stating that he had an appt to see Ellouise Newer and he was in the parking lot and he loss control of his bowels. Pt was originally being seen for gastroparesis. I did schedule him to see Anderson Malta for tomorrow and added diarrhea to his appt. Thank you.

## 2020-12-29 NOTE — Telephone Encounter (Signed)
Spoke with patient, he states that he had an episode like this about 3-4 years ago when he had cancer. Pt states that his first episode was yesterday afternoon. Pt states that he slept all night with no episodes. Pt states that today all he ate was a pack of crackers and trying to stay hydrated. Pt denies any bleeding. Pt states that he had some constipation earlier this week. Advised patient that we will need to wait until his appt tomorrow with Anderson Malta because if he has overflow diarrhea we do not want him to take Imodium. Pt will monitor symptoms until appt tomorrow. Pt had no further concerns at the end of the call.

## 2020-12-30 ENCOUNTER — Ambulatory Visit: Payer: Medicare Other | Admitting: Physician Assistant

## 2020-12-30 NOTE — Addendum Note (Signed)
Addended by: Yevette Edwards on: 12/30/2020 03:53 PM   Modules accepted: Orders

## 2020-12-30 NOTE — Telephone Encounter (Signed)
Pt returned call. He has been advised of recommendations. Pt will try to come in on Monday for x-ray. He is aware that no appointment is necessary and he has been given x-ray department hours. He is aware that Anderson Malta will further advise after she reviews his results. Pt verbalized understanding and had no concerns at the end of the call.

## 2020-12-30 NOTE — Telephone Encounter (Signed)
Spoke with patient, he states that he has not had any "episodes" today. Pt states that he is scared to travel. He states that him ad his gf share a car and she has already left for work. Patient has not had a bowel movement today. He reports that he has gotten lower abdominal pain, he describes as a dull ache. Pt states that the pain feels like "he has to go but can't". Pt is still holding Reglan, he denies any nausea or vomiting. Pt has rescheduled his appt to Tuesday, 01/24/21 at 1:30 PM. Patient is seeking further advice. Thanks

## 2020-12-30 NOTE — Telephone Encounter (Signed)
Inbound call from patient stating he will not be able to make it to his appt for today and is requesting to speak with a nurse again.

## 2020-12-30 NOTE — Telephone Encounter (Signed)
Attempted to reach patient twice. His vm is full and unable to accept any new voicemail's at this time.

## 2021-01-02 ENCOUNTER — Other Ambulatory Visit: Payer: Self-pay

## 2021-01-02 ENCOUNTER — Ambulatory Visit (INDEPENDENT_AMBULATORY_CARE_PROVIDER_SITE_OTHER)
Admission: RE | Admit: 2021-01-02 | Discharge: 2021-01-02 | Disposition: A | Discharge status: Home / Self Care | Payer: Medicare Other | Source: Ambulatory Visit | Attending: Physician Assistant | Admitting: Physician Assistant

## 2021-01-02 DIAGNOSIS — K3184 Gastroparesis: Secondary | ICD-10-CM

## 2021-01-02 DIAGNOSIS — K5900 Constipation, unspecified: Secondary | ICD-10-CM | POA: Diagnosis not present

## 2021-01-14 DIAGNOSIS — M810 Age-related osteoporosis without current pathological fracture: Secondary | ICD-10-CM | POA: Insufficient documentation

## 2021-01-23 ENCOUNTER — Emergency Department (HOSPITAL_COMMUNITY): Payer: Medicare Other

## 2021-01-23 ENCOUNTER — Inpatient Hospital Stay (HOSPITAL_COMMUNITY)
Admission: EM | Admit: 2021-01-23 | Discharge: 2021-01-29 | DRG: 177 | Disposition: A | Discharge status: Home / Self Care | Payer: Medicare Other | Attending: Internal Medicine | Admitting: Internal Medicine

## 2021-01-23 ENCOUNTER — Encounter (HOSPITAL_COMMUNITY): Payer: Self-pay

## 2021-01-23 ENCOUNTER — Other Ambulatory Visit: Payer: Self-pay

## 2021-01-23 ENCOUNTER — Inpatient Hospital Stay (HOSPITAL_COMMUNITY): Payer: Medicare Other

## 2021-01-23 DIAGNOSIS — E876 Hypokalemia: Secondary | ICD-10-CM | POA: Diagnosis present

## 2021-01-23 DIAGNOSIS — I248 Other forms of acute ischemic heart disease: Secondary | ICD-10-CM | POA: Diagnosis present

## 2021-01-23 DIAGNOSIS — Z794 Long term (current) use of insulin: Secondary | ICD-10-CM

## 2021-01-23 DIAGNOSIS — E1165 Type 2 diabetes mellitus with hyperglycemia: Secondary | ICD-10-CM | POA: Diagnosis present

## 2021-01-23 DIAGNOSIS — E11649 Type 2 diabetes mellitus with hypoglycemia without coma: Secondary | ICD-10-CM | POA: Diagnosis not present

## 2021-01-23 DIAGNOSIS — A0839 Other viral enteritis: Secondary | ICD-10-CM | POA: Diagnosis present

## 2021-01-23 DIAGNOSIS — J9601 Acute respiratory failure with hypoxia: Secondary | ICD-10-CM | POA: Diagnosis present

## 2021-01-23 DIAGNOSIS — D631 Anemia in chronic kidney disease: Secondary | ICD-10-CM | POA: Diagnosis present

## 2021-01-23 DIAGNOSIS — E1343 Other specified diabetes mellitus with diabetic autonomic (poly)neuropathy: Secondary | ICD-10-CM | POA: Diagnosis not present

## 2021-01-23 DIAGNOSIS — Z7982 Long term (current) use of aspirin: Secondary | ICD-10-CM

## 2021-01-23 DIAGNOSIS — C779 Secondary and unspecified malignant neoplasm of lymph node, unspecified: Secondary | ICD-10-CM | POA: Diagnosis present

## 2021-01-23 DIAGNOSIS — E1143 Type 2 diabetes mellitus with diabetic autonomic (poly)neuropathy: Secondary | ICD-10-CM | POA: Diagnosis present

## 2021-01-23 DIAGNOSIS — C7880 Secondary malignant neoplasm of unspecified digestive organ: Secondary | ICD-10-CM | POA: Diagnosis present

## 2021-01-23 DIAGNOSIS — D638 Anemia in other chronic diseases classified elsewhere: Secondary | ICD-10-CM | POA: Diagnosis present

## 2021-01-23 DIAGNOSIS — C4359 Malignant melanoma of other part of trunk: Secondary | ICD-10-CM | POA: Diagnosis present

## 2021-01-23 DIAGNOSIS — N1832 Chronic kidney disease, stage 3b: Secondary | ICD-10-CM | POA: Diagnosis present

## 2021-01-23 DIAGNOSIS — J9600 Acute respiratory failure, unspecified whether with hypoxia or hypercapnia: Secondary | ICD-10-CM

## 2021-01-23 DIAGNOSIS — E1122 Type 2 diabetes mellitus with diabetic chronic kidney disease: Secondary | ICD-10-CM | POA: Diagnosis present

## 2021-01-23 DIAGNOSIS — J189 Pneumonia, unspecified organism: Secondary | ICD-10-CM

## 2021-01-23 DIAGNOSIS — Z8673 Personal history of transient ischemic attack (TIA), and cerebral infarction without residual deficits: Secondary | ICD-10-CM

## 2021-01-23 DIAGNOSIS — N1831 Chronic kidney disease, stage 3a: Secondary | ICD-10-CM | POA: Diagnosis present

## 2021-01-23 DIAGNOSIS — N182 Chronic kidney disease, stage 2 (mild): Secondary | ICD-10-CM | POA: Diagnosis present

## 2021-01-23 DIAGNOSIS — U071 COVID-19: Secondary | ICD-10-CM | POA: Diagnosis present

## 2021-01-23 DIAGNOSIS — Z808 Family history of malignant neoplasm of other organs or systems: Secondary | ICD-10-CM

## 2021-01-23 DIAGNOSIS — J159 Unspecified bacterial pneumonia: Secondary | ICD-10-CM | POA: Diagnosis present

## 2021-01-23 DIAGNOSIS — K3184 Gastroparesis: Secondary | ICD-10-CM | POA: Diagnosis present

## 2021-01-23 DIAGNOSIS — E43 Unspecified severe protein-calorie malnutrition: Secondary | ICD-10-CM | POA: Diagnosis present

## 2021-01-23 DIAGNOSIS — J1282 Pneumonia due to coronavirus disease 2019: Secondary | ICD-10-CM | POA: Diagnosis present

## 2021-01-23 DIAGNOSIS — I5032 Chronic diastolic (congestive) heart failure: Secondary | ICD-10-CM | POA: Diagnosis present

## 2021-01-23 DIAGNOSIS — D696 Thrombocytopenia, unspecified: Secondary | ICD-10-CM | POA: Diagnosis present

## 2021-01-23 DIAGNOSIS — Z8711 Personal history of peptic ulcer disease: Secondary | ICD-10-CM

## 2021-01-23 DIAGNOSIS — Z91041 Radiographic dye allergy status: Secondary | ICD-10-CM

## 2021-01-23 DIAGNOSIS — Z87891 Personal history of nicotine dependence: Secondary | ICD-10-CM

## 2021-01-23 DIAGNOSIS — I10 Essential (primary) hypertension: Secondary | ICD-10-CM | POA: Diagnosis not present

## 2021-01-23 DIAGNOSIS — N179 Acute kidney failure, unspecified: Secondary | ICD-10-CM

## 2021-01-23 DIAGNOSIS — K219 Gastro-esophageal reflux disease without esophagitis: Secondary | ICD-10-CM | POA: Diagnosis present

## 2021-01-23 DIAGNOSIS — Z833 Family history of diabetes mellitus: Secondary | ICD-10-CM

## 2021-01-23 DIAGNOSIS — I130 Hypertensive heart and chronic kidney disease with heart failure and stage 1 through stage 4 chronic kidney disease, or unspecified chronic kidney disease: Secondary | ICD-10-CM | POA: Diagnosis present

## 2021-01-23 DIAGNOSIS — R042 Hemoptysis: Secondary | ICD-10-CM | POA: Diagnosis present

## 2021-01-23 DIAGNOSIS — Z79899 Other long term (current) drug therapy: Secondary | ICD-10-CM

## 2021-01-23 DIAGNOSIS — M1990 Unspecified osteoarthritis, unspecified site: Secondary | ICD-10-CM | POA: Diagnosis present

## 2021-01-23 DIAGNOSIS — Z6828 Body mass index (BMI) 28.0-28.9, adult: Secondary | ICD-10-CM

## 2021-01-23 LAB — CBC WITH DIFFERENTIAL/PLATELET
Abs Immature Granulocytes: 0.04 10*3/uL (ref 0.00–0.07)
Basophils Absolute: 0 10*3/uL (ref 0.0–0.1)
Basophils Relative: 0 %
Eosinophils Absolute: 0 10*3/uL (ref 0.0–0.5)
Eosinophils Relative: 0 %
HCT: 35.2 % — ABNORMAL LOW (ref 39.0–52.0)
Hemoglobin: 12 g/dL — ABNORMAL LOW (ref 13.0–17.0)
Immature Granulocytes: 0 %
Lymphocytes Relative: 9 %
Lymphs Abs: 1 10*3/uL (ref 0.7–4.0)
MCH: 30.9 pg (ref 26.0–34.0)
MCHC: 34.1 g/dL (ref 30.0–36.0)
MCV: 90.7 fL (ref 80.0–100.0)
Monocytes Absolute: 0.4 10*3/uL (ref 0.1–1.0)
Monocytes Relative: 4 %
Neutro Abs: 9.3 10*3/uL — ABNORMAL HIGH (ref 1.7–7.7)
Neutrophils Relative %: 87 %
Platelets: 142 10*3/uL — ABNORMAL LOW (ref 150–400)
RBC: 3.88 MIL/uL — ABNORMAL LOW (ref 4.22–5.81)
RDW: 14.4 % (ref 11.5–15.5)
WBC: 10.7 10*3/uL — ABNORMAL HIGH (ref 4.0–10.5)
nRBC: 0 % (ref 0.0–0.2)

## 2021-01-23 LAB — RESP PANEL BY RT-PCR (FLU A&B, COVID) ARPGX2
Influenza A by PCR: NEGATIVE
Influenza B by PCR: NEGATIVE
SARS Coronavirus 2 by RT PCR: POSITIVE — AB

## 2021-01-23 LAB — TRIGLYCERIDES: Triglycerides: 73 mg/dL (ref <150)

## 2021-01-23 LAB — COMPREHENSIVE METABOLIC PANEL
ALT: 20 U/L (ref 0–44)
AST: 27 U/L (ref 15–41)
Albumin: 3.8 g/dL (ref 3.5–5.0)
Alkaline Phosphatase: 45 U/L (ref 38–126)
Anion gap: 11 (ref 5–15)
BUN: 39 mg/dL — ABNORMAL HIGH (ref 8–23)
CO2: 25 mmol/L (ref 22–32)
Calcium: 8 mg/dL — ABNORMAL LOW (ref 8.9–10.3)
Chloride: 101 mmol/L (ref 98–111)
Creatinine, Ser: 1.9 mg/dL — ABNORMAL HIGH (ref 0.61–1.24)
GFR, Estimated: 38 mL/min — ABNORMAL LOW (ref >60)
Glucose, Bld: 218 mg/dL — ABNORMAL HIGH (ref 70–99)
Potassium: 3.4 mmol/L — ABNORMAL LOW (ref 3.5–5.1)
Sodium: 137 mmol/L (ref 135–145)
Total Bilirubin: 1.6 mg/dL — ABNORMAL HIGH (ref 0.3–1.2)
Total Protein: 7.3 g/dL (ref 6.5–8.1)

## 2021-01-23 LAB — D-DIMER, QUANTITATIVE: D-Dimer, Quant: 1.07 ug/mL-FEU — ABNORMAL HIGH (ref 0.00–0.50)

## 2021-01-23 LAB — LACTATE DEHYDROGENASE: LDH: 205 U/L — ABNORMAL HIGH (ref 98–192)

## 2021-01-23 LAB — PROCALCITONIN: Procalcitonin: 22.29 ng/mL

## 2021-01-23 LAB — BLOOD GAS, VENOUS
Acid-Base Excess: 0 mmol/L (ref 0.0–2.0)
Bicarbonate: 25.2 mmol/L (ref 20.0–28.0)
O2 Saturation: 64.7 %
Patient temperature: 98.6
pCO2, Ven: 46 mmHg (ref 44.0–60.0)
pH, Ven: 7.358 (ref 7.250–7.430)
pO2, Ven: 37.2 mmHg (ref 32.0–45.0)

## 2021-01-23 LAB — I-STAT CHEM 8, ED
BUN: 35 mg/dL — ABNORMAL HIGH (ref 8–23)
Calcium, Ion: 1.09 mmol/L — ABNORMAL LOW (ref 1.15–1.40)
Chloride: 101 mmol/L (ref 98–111)
Creatinine, Ser: 1.8 mg/dL — ABNORMAL HIGH (ref 0.61–1.24)
Glucose, Bld: 209 mg/dL — ABNORMAL HIGH (ref 70–99)
HCT: 35 % — ABNORMAL LOW (ref 39.0–52.0)
Hemoglobin: 11.9 g/dL — ABNORMAL LOW (ref 13.0–17.0)
Potassium: 3.4 mmol/L — ABNORMAL LOW (ref 3.5–5.1)
Sodium: 140 mmol/L (ref 135–145)
TCO2: 24 mmol/L (ref 22–32)

## 2021-01-23 LAB — LACTIC ACID, PLASMA: Lactic Acid, Venous: 2.1 mmol/L (ref 0.5–1.9)

## 2021-01-23 LAB — FIBRINOGEN: Fibrinogen: 669 mg/dL — ABNORMAL HIGH (ref 210–475)

## 2021-01-23 LAB — C-REACTIVE PROTEIN: CRP: 24 mg/dL — ABNORMAL HIGH (ref <1.0)

## 2021-01-23 LAB — FERRITIN: Ferritin: 266 ng/mL (ref 24–336)

## 2021-01-23 LAB — CBG MONITORING, ED: Glucose-Capillary: 235 mg/dL — ABNORMAL HIGH (ref 70–99)

## 2021-01-23 MED ORDER — SODIUM CHLORIDE 0.9 % IV SOLN
2.0000 g | INTRAVENOUS | Status: AC
  Administered 2021-01-23 – 2021-01-27 (×5): 2 g via INTRAVENOUS
  Filled 2021-01-23 (×5): qty 20

## 2021-01-23 MED ORDER — AZITHROMYCIN 500 MG IV SOLR
500.0000 mg | INTRAVENOUS | Status: AC
  Administered 2021-01-24 – 2021-01-27 (×5): 500 mg via INTRAVENOUS
  Filled 2021-01-23 (×5): qty 500

## 2021-01-23 MED ORDER — HYDROCORTISONE SOD SUC (PF) 250 MG IJ SOLR
200.0000 mg | Freq: Once | INTRAMUSCULAR | Status: AC
  Administered 2021-01-23: 200 mg via INTRAVENOUS
  Filled 2021-01-23: qty 200

## 2021-01-23 MED ORDER — DIPHENHYDRAMINE HCL 25 MG PO CAPS
50.0000 mg | ORAL_CAPSULE | Freq: Once | ORAL | Status: DC
Start: 2021-01-24 — End: 2021-01-25
  Filled 2021-01-23: qty 2

## 2021-01-23 MED ORDER — SODIUM CHLORIDE 0.9 % IV SOLN
100.0000 mg | Freq: Every day | INTRAVENOUS | Status: AC
  Administered 2021-01-24 – 2021-01-27 (×4): 100 mg via INTRAVENOUS
  Filled 2021-01-23 (×4): qty 20

## 2021-01-23 MED ORDER — DIPHENHYDRAMINE HCL 50 MG/ML IJ SOLN
50.0000 mg | Freq: Once | INTRAMUSCULAR | Status: DC
  Filled 2021-01-23: qty 1

## 2021-01-23 MED ORDER — INSULIN ASPART 100 UNIT/ML IJ SOLN
0.0000 [IU] | INTRAMUSCULAR | Status: DC
Start: 2021-01-23 — End: 2021-01-24
  Administered 2021-01-24 (×4): 5 [IU] via SUBCUTANEOUS
  Administered 2021-01-24: 3 [IU] via SUBCUTANEOUS
  Filled 2021-01-23: qty 0.09

## 2021-01-23 MED ORDER — SODIUM CHLORIDE 0.9 % IV SOLN
100.0000 mg | Freq: Once | INTRAVENOUS | Status: AC
  Administered 2021-01-23: 100 mg via INTRAVENOUS
  Filled 2021-01-23: qty 20

## 2021-01-23 MED ORDER — ALBUTEROL SULFATE HFA 108 (90 BASE) MCG/ACT IN AERS
2.0000 | INHALATION_SPRAY | Freq: Once | RESPIRATORY_TRACT | Status: AC
  Administered 2021-01-23: 2 via RESPIRATORY_TRACT
  Filled 2021-01-23: qty 6.7

## 2021-01-23 MED ORDER — DEXAMETHASONE SODIUM PHOSPHATE 10 MG/ML IJ SOLN
8.0000 mg | Freq: Once | INTRAMUSCULAR | Status: AC
  Administered 2021-01-23: 8 mg via INTRAVENOUS
  Filled 2021-01-23: qty 1

## 2021-01-23 NOTE — ED Triage Notes (Signed)
BIB EMS from home.  Complains of productive cough x 3 days and diarrhea.  EMS did home covid test which was positive.  Initial room sats  88% placed on 4L [place]

## 2021-01-23 NOTE — H&P (Signed)
Jesse Lane UTM:[id number] DOB: 1955/05/31 DOA: 01/23/2021     PCP: [name], MD   Outpatient Specialists:    [facility]  Patient arrived to ER on 01/23/21 at 1845 Referred by Attending Drenda Freeze, MD   Patient coming from: home    Chief Complaint:   Chief Complaint  Patient presents with   Shortness of Breath    HPI: Jesse Lane is a 65 y.o. male with medical history significant of DM1, HTN melanoma  CVA, gastroparesis  Presented with   shortness of breath and diarrhea. sore throat for the last 3-4 days Noted to be hypoxic on EMS arrival  A rapid antigen test was performed by EMS that was positive.     KNOWN COVID POSITIVE   Has  been vaccinated against COVID and boosted   Initial COVID TEST    POSITIVE,     Lab Results  Component Value Date   [place] (A) 01/23/2021   [place] NEGATIVE 08/27/2020   [place] NEGATIVE 10/16/2019     Regarding pertinent Chronic problems:    Hyperlipidemia -  on statins Lipitor Lipid Panel     Component Value Date/Time   CHOL 150 10/17/2019 0255   TRIG 73 01/23/2021 2038   HDL 23 (L) 10/17/2019 0255   CHOLHDL 6.5 10/17/2019 0255   VLDL 37 10/17/2019 0255   LDLCALC 90 10/17/2019 0255     HTN on Norvasc lisinopril, bystolic   chronic CHF diastolic - last echo July [id number] I diastolic dysfunction      DM 1 -  Lab Results  Component Value Date   HGBA1C 6.4 (H) 08/28/2020    on  Tresiba at 24 units once a day.      Hx of CVA -  on Aspirin 81 mg,       CKD stage IIIa- baseline Cr 1.3 Estimated Creatinine Clearance: 44.6 mL/min (A) (by C-G formula based on SCr of 1.9 mg/dL (H)).  Lab Results  Component Value Date   CREATININE 1.90 (H) 01/23/2021   CREATININE 1.80 (H) 01/23/2021   CREATININE 1.28 (H) 08/29/2020        Chronic anemia - baseline hg Hemoglobin & Hematocrit  Recent Labs    08/29/20 0603 01/23/21 2028 01/23/21 2037  HGB 12.9* 11.9*  12.0*    While in ER: Started on remdesivir CXR worrisome for PNA Multifocal airspace disease in the left mid lung compatible with pneumonia.    ED Triage Vitals  Enc Vitals Group     BP 01/23/21 1903 (!) 150/63     Pulse Rate 01/23/21 1903 66     Resp 01/23/21 1903 18     Temp 01/23/21 1903 100 F (37.8 C)     Temp Source 01/23/21 1903 Oral     SpO2 01/23/21 1857 (!) 88 %     Weight 01/23/21 1900 205 lb (93 kg)     Height 01/23/21 1900 5\' 11"  (1.803 m)     Head Circumference --      Peak Flow --      Pain Score 01/23/21 1900 7     Pain Loc --      Pain Edu? --      Excl. in [place]? --   TMAX(24)@     _________________________________________ Significant initial  Findings: Abnormal Labs Reviewed  CBC WITH DIFFERENTIAL/PLATELET - Abnormal; Notable for the following components:      Result Value   WBC 10.7 (*)    RBC  3.88 (*)    Hemoglobin 12.0 (*)    HCT 35.2 (*)    Platelets 142 (*)    Neutro Abs 9.3 (*)    All other components within normal limits  COMPREHENSIVE METABOLIC PANEL - Abnormal; Notable for the following components:   Potassium 3.4 (*)    Glucose, Bld 218 (*)    BUN 39 (*)    Creatinine, Ser 1.90 (*)    Calcium 8.0 (*)    Total Bilirubin 1.6 (*)    GFR, Estimated 38 (*)    All other components within normal limits  D-DIMER, QUANTITATIVE - Abnormal; Notable for the following components:   D-Dimer, Quant 1.07 (*)    All other components within normal limits  LACTATE DEHYDROGENASE - Abnormal; Notable for the following components:   LDH 205 (*)    All other components within normal limits  FIBRINOGEN - Abnormal; Notable for the following components:   Fibrinogen 669 (*)    All other components within normal limits  I-STAT CHEM 8, ED - Abnormal; Notable for the following components:   Potassium 3.4 (*)    BUN 35 (*)    Creatinine, Ser 1.80 (*)    Glucose, Bld 209 (*)    Calcium, Ion 1.09 (*)    Hemoglobin 11.9 (*)    HCT 35.0 (*)    All other  components within normal limits   ____________________________________________ Ordered    CXR - multifocal pNA    _________________________ Troponin  ordered ECG: Ordered Personally reviewed by me showing: HR : 66 Rhythm:  NSR,   no evidence of ischemic changes QTC 466   The recent clinical data is shown below. Vitals:   01/23/21 1903 01/23/21 2015 01/23/21 2030 01/23/21 2046  BP: (!) 150/63 (!) 155/74 (!) 155/76 (!) 159/72  Pulse: 66 65 64 64  Resp: 18 (!) 32  (!) 22  Temp: 100 F (37.8 C)     TempSrc: Oral     SpO2: 97% 95% 92% 92%  Weight:      Height:        WBC     Component Value Date/Time   WBC 10.7 (H) 01/23/2021 2037   LYMPHSABS 1.0 01/23/2021 2037   MONOABS 0.4 01/23/2021 2037   EOSABS 0.0 01/23/2021 2037   BASOSABS 0.0 01/23/2021 2037    Lactic Acid, Venous    Component Value Date/Time   LATICACIDVEN 2.1 (HH) 01/23/2021 2038     Procalcitonin 22       UA ordered     Results for orders placed or performed during the hospital encounter of 01/23/21  Blood Culture (routine x 2)     Status: None (Preliminary result)   Collection Time: 01/23/21  8:39 PM   Specimen: Site Not Specified; Blood  Result Value Ref Range Status   Specimen Description   Final    SITE NOT SPECIFIED BLOOD Performed at [facility], [place] [address] [place], [place] [address]    Special Requests   Final    Blood Culture adequate volume BOTTLES DRAWN AEROBIC AND ANAEROBIC Performed at [facility] [address] [place], [place] [address]    Culture PENDING  Incomplete   Report Status PENDING  Incomplete  Blood Culture (routine x 2)     Status: None (Preliminary result)   Collection Time: 01/23/21  8:40 PM   Specimen: Site Not Specified; Blood  Result Value Ref Range Status   Specimen Description   Final  SITE NOT SPECIFIED BLOOD Performed at [facility], [place] [address] [place], [place] [address]    Special Requests   Final     BOTTLES DRAWN AEROBIC AND ANAEROBIC Blood Culture adequate volume Performed at [facility] [address] [place], [place] [address]    Culture PENDING  Incomplete   Report Status PENDING  Incomplete  Resp Panel by RT-PCR (Flu A&B, Covid) Nasopharyngeal Swab     Status: Abnormal   Collection Time: 01/23/21  8:45 PM   Specimen: Nasopharyngeal Swab; Nasopharyngeal(NP) swabs in vial transport medium  Result Value Ref Range Status   SARS Coronavirus 2 by RT PCR POSITIVE (A) NEGATIVE Final         Influenza A by PCR NEGATIVE NEGATIVE Final   Influenza B by PCR NEGATIVE NEGATIVE Final          _______________________________________________ Hospitalist was called for admission for CAP vs COVID PNA  The following Work up has been ordered so far:  Orders Placed This Encounter  Procedures   Resp Panel by RT-PCR (Flu A&B, Covid) Nasopharyngeal Swab   Blood Culture (routine x 2)   DG Chest Port 1 View   Lactic acid, plasma   CBC WITH DIFFERENTIAL   Comprehensive metabolic panel   D-dimer, quantitative   Procalcitonin   Lactate dehydrogenase   Ferritin   Triglycerides   Fibrinogen   C-reactive protein   Blood gas, venous   Diet NPO time specified   Cardiac monitoring   Insert peripheral IV x 2   Initiate Carrier Fluid Protocol   Place surgical mask on patient   Patient to wear surgical mask during transportation   Assess patient for ability to self-prone. If able (can move self in bed, ambulate) and stable (SpO2 and oxygen requirement):   RN/NT - Document specific oxygen requirements in [facility]   Notify EDP if new oxygen requirements escalates > 4L per minute [place]   RN to draw the following extra tubes:   remdesivir per pharmacy consult   Consult to hospitalist   Airborne and Contact precautions   Pulse oximetry, continuous   I-stat chem 8, ED (not at [facility] or [facility])   ED EKG 12-Lead      Following Medications were ordered in ER: Medications  remdesivir 100  mg in sodium chloride 0.9 % 100 mL IVPB (100 mg Intravenous New Bag/Given 01/23/21 2039)    Followed by  remdesivir 100 mg in sodium chloride 0.9 % 100 mL IVPB (has no administration in time range)    Followed by  remdesivir 100 mg in sodium chloride 0.9 % 100 mL IVPB (has no administration in time range)  dexamethasone (DECADRON) injection 8 mg (8 mg Intravenous Given 01/23/21 2038)  albuterol (VENTOLIN HFA) 108 (90 Base) MCG/ACT inhaler 2 puff (2 puffs Inhalation Given 01/23/21 2040)        Consult Orders  (From admission, onward)           Start     Ordered   01/23/21 2113  Consult to hospitalist  Once       Provider:  (Not yet assigned)  Question Answer Comment  Place call to: [facility]   Reason for Consult Admit      01/23/21 2112              OTHER Significant initial  Findings:  labs showing:    Recent Labs  Lab 01/23/21 2028 01/23/21 2037  NA 140 137  K 3.4* 3.4*  CO2  --  25  GLUCOSE 209* 218*  BUN 35* 39*  CREATININE 1.80* 1.90*  CALCIUM  --  8.0*    Cr   Up from baseline see below Lab Results  Component Value Date   CREATININE 1.90 (H) 01/23/2021   CREATININE 1.80 (H) 01/23/2021   CREATININE 1.28 (H) 08/29/2020    Recent Labs  Lab 01/23/21 2037  AST 27  ALT 20  ALKPHOS 45  BILITOT 1.6*  PROT 7.3  ALBUMIN 3.8   Lab Results  Component Value Date   CALCIUM 8.0 (L) 01/23/2021   PHOS 2.8 08/27/2020          Plt: Lab Results  Component Value Date   PLT 142 (L) 01/23/2021     COVID-19 Labs  Recent Labs    01/23/21 2037 01/23/21 2038  DDIMER 1.07*  --   FERRITIN  --  266  LDH 205*  --   CRP  --  24.0*        Recent Labs  Lab 01/23/21 2028 01/23/21 2037  WBC  --  10.7*  NEUTROABS  --  9.3*  HGB 11.9* 12.0*  HCT 35.0* 35.2*  MCV  --  90.7  PLT  --  142*    HG/HCT  stable,       Component Value Date/Time   HGB 12.0 (L) 01/23/2021 2037   HCT 35.2 (L) 01/23/2021 2037   MCV 90.7 01/23/2021 2037      DM  labs:  HbA1C: Recent Labs    08/28/20 0559  HGBA1C 6.4*       CBG (last 3)  No results for input(s): GLUCAP in the last 72 hours.        Cultures:    Component Value Date/Time   SDES BLOOD LEFT ANTECUBITAL 08/13/2016 1054   SDES BLOOD RIGHT HAND 08/13/2016 1054   SPECREQUEST  08/13/2016 1054    BOTTLES DRAWN AEROBIC AND ANAEROBIC Blood Culture adequate volume   SPECREQUEST  08/13/2016 1054    BOTTLES DRAWN AEROBIC AND ANAEROBIC Blood Culture adequate volume   CULT  08/13/2016 1054    NO GROWTH 5 DAYS Performed at [facility], [place] [address], [place], [place] [address]    CULT  08/13/2016 1054    NO GROWTH 5 DAYS Performed at [facility], [place] [address], [place], [place] [address]    REPTSTATUS 08/18/2016 FINAL 08/13/2016 1054   REPTSTATUS 08/18/2016 FINAL 08/13/2016 1054     Radiological Exams on Admission: DG Chest Port 1 View  Result Date: 01/23/2021 CLINICAL DATA:  Fever, COVID. EXAM: PORTABLE CHEST 1 VIEW COMPARISON:  Chest x-ray 10/17/2019. FINDINGS: There is dense patchy airspace consolidation throughout the left mid lung. Costophrenic angles are clear. No pneumothorax. Cardiomediastinal silhouette within normal limits. Osseous structures are within normal limits. IMPRESSION: Multifocal airspace disease in the left mid lung compatible with pneumonia. Followup PA and lateral chest X-ray is recommended in 3-4 weeks following trial of antibiotic therapy to ensure resolution and exclude underlying malignancy. Electronically Signed   By: Ronney Asters M.D.   On: 01/23/2021 20:26   _______________________________________________________________________________________________________ Latest  Blood pressure (!) 159/72, pulse 64, temperature 100 F (37.8 C), temperature source Oral, resp. rate (!) 22, height 5\' 11"  (1.803 m), weight 93 kg, SpO2 92 %.   Review of Systems:    Pertinent positives include:  Fevers, chills, fatigue, shortness of breath at  rest.  dyspnea on exertion, non-productive cough Constitutional:  No weight loss, night sweats,  weight loss  HEENT:  No headaches, Difficulty swallowing,Tooth/dental problems,Sore throat,  No sneezing, itching, ear ache, nasal congestion, post nasal drip,  Cardio-vascular:  No chest pain, Orthopnea, PND, anasarca, dizziness, palpitations.no Bilateral lower extremity swelling  GI:  No heartburn, indigestion, abdominal pain, nausea, vomiting, diarrhea, change in bowel habits, loss of appetite, melena, blood in stool, hematemesis Resp:  no No No excess mucus, no productive cough, No, No coughing up of blood.No change in color of mucus.No wheezing. Skin:  no rash or lesions. No jaundice GU:  no dysuria, change in color of urine, no urgency or frequency. No straining to urinate.  No flank pain.  Musculoskeletal:  No joint pain or no joint swelling. No decreased range of motion. No back pain.  Psych:  No change in mood or affect. No depression or anxiety. No memory loss.  Neuro: no localizing neurological complaints, no tingling, no weakness, no double vision, no gait abnormality, no slurred speech, no confusion  All systems reviewed and apart from [place] all are negative _______________________________________________________________________________________________ Past Medical History:   Past Medical History:  Diagnosis Date   Arthritis    Diabetes mellitus type 2, uncontrolled    Diabetic neuropathy ([place])    GERD (gastroesophageal reflux disease)    History of stomach ulcers    Hypertension    Melanoma ([place])    mets to lymph node and intestines     Past Surgical History:  Procedure Laterality Date   ABCESS DRAINAGE     gluteal area   COLONOSCOPY     INGUINAL HERNIA REPAIR N/A 08/15/2015   Procedure: HERNIA REPAIR INGUINAL INCARCERATED;  Surgeon: Alphonsa Overall, MD;  Location: [facility];  Service: General;  Laterality: N/A;  with MESH   pyloric stenosis     TONSILLECTOMY       Social History:  Ambulatory   independently       reports that he has quit smoking. His smoking use included cigarettes. He has a 5.00 pack-year smoking history. He has never used smokeless tobacco. He reports current alcohol use of about 2.0 standard drinks per week. He reports that he does not currently use drugs after having used the following drugs: Marijuana.     Family History:   Family History  Problem Relation Age of Onset   Breast cancer Mother    Bone cancer Mother    Melanoma Mother    Prostate cancer Father    Pancreatic cancer Maternal Grandfather    Diabetes Paternal Aunt    Colon cancer Neg Hx    Colon polyps Neg Hx    Esophageal cancer Neg Hx    Stomach cancer Neg Hx    ______________________________________________________________________________________________ Allergies: Allergies  Allergen Reactions   Contrast Media [Iodinated Diagnostic Agents] Itching     Prior to Admission medications   Medication Sig Start Date End Date Taking? Authorizing Provider  acetaminophen (TYLENOL) 500 MG tablet Take 1,000 mg by mouth 2 (two) times daily as needed for mild pain or headache (sleep).     [provider]  alum & mag hydroxide-simeth (MAALOX/MYLANTA) 200-200-20 MG/5ML suspension Take 15 mLs by mouth every 4 (four) hours as needed for indigestion or heartburn. 08/29/20   Aline August, MD  amLODipine (NORVASC) 10 MG tablet Take 10 mg by mouth daily. 11/28/19   [provider]  aspirin EC 81 MG EC tablet Take 1 tablet (81 mg total) by mouth daily. Swallow whole. 10/19/19   Rinehuls, David L, PA-C  atorvastatin (LIPITOR) 40 MG tablet  Take 1 tablet (40 mg total) by mouth daily. 10/19/19   Rinehuls, Early Chars, PA-C  Continuous Blood Gluc Receiver ([place]) DEVI by Does not apply route.    [provider]  Continuous Blood Gluc Sensor (DEXCOM G6 SENSOR) MISC by Does not apply route.    [provider]  Continuous Blood Gluc  Transmit (DEXCOM G6 TRANSMITTER) MISC by Does not apply route.    [provider]  hydrochlorothiazide (HYDRODIURIL) 25 MG tablet Take 25 mg by mouth at bedtime.    [provider]  insulin degludec (TRESIBA FLEXTOUCH) 100 UNIT/ML FlexTouch Pen Inject 24 Units into the skin daily before breakfast.    [provider]  insulin lispro (HUMALOG) 100 UNIT/ML KwikPen Inject 10-12 Units into the skin 3 (three) times daily. Injection 10-12 units three times daily before meals as needed: for CBG 180-200 10 units, >200 12 units . Sliding scale    [provider]  Insulin Pen Needle (BD PEN NEEDLE MICRO U/F) 32G X 6 MM MISC 1 each by Other route See admin instructions. Use one pen needle to inject insulin 4 times daily. **must have appointment for additional refills.** 06/30/18   Renato Shin, MD  Insulin Pen Needle (BD PEN NEEDLE MICRO U/F) 32G X 6 MM MISC USE 1 PEN NEEDLE SIX TIMES DAILY Patient taking differently: 1 each by Other route See admin instructions. USE 1 PEN NEEDLE SIX TIMES DAILY 07/02/18   Renato Shin, MD  lisinopril (PRINIVIL,ZESTRIL) 40 MG tablet Take 40 mg by mouth at bedtime.  07/24/16   [provider]  metoCLOPramide (REGLAN) 10 MG tablet Take 1 tablet (10 mg total) by mouth 4 (four) times daily -  before meals and at bedtime. 10/28/20   Levin Erp, PA  montelukast (SINGULAIR) 10 MG tablet Take 10 mg by mouth daily. 10/13/19   [provider]  Nebivolol HCl (BYSTOLIC) 20 MG TABS Take 20 mg by mouth at bedtime.    [provider]  ondansetron (ZOFRAN) 4 MG tablet Take 1 tablet (4 mg total) by mouth every 6 (six) hours as needed for nausea. 09/21/20   Levin Erp, PA  pantoprazole (PROTONIX) 40 MG tablet Take 1 tablet by mouth twice daily Patient taking differently: Take 40 mg by mouth daily. 05/04/20   Milus Banister, MD  pregabalin (LYRICA) 100 MG capsule Take 100-300 mg by mouth See admin instructions. Take 2  capsule (200 mg) by mouth every morning and take three capsules (300 mg) at bedtime 10/15/19   [provider]  sucralfate (CARAFATE) 1 GM/10ML suspension Take 2 g by mouth 3 (three) times daily.    [provider]  traZODone (DESYREL) 100 MG tablet Take 100 mg by mouth at bedtime. 07/24/16   [provider]    ___________________________________________________________________________________________________ Physical Exam: Vitals with BMI 01/23/2021 01/23/2021 01/23/2021  Height - - -  Weight - - -  BMI - - -  Systolic 993 570 177  Diastolic 72 76 74  Pulse 64 64 65     1. General:  in No  Acute distress    Chronically ill   -appearing 2. Psychological: Alert and  Oriented 3. Head/ENT:    Dry Mucous Membranes                          Head Non traumatic, neck supple  Poor Dentition 4. SKIN:    decreased Skin turgor,  Skin clean Dry and intact no rash 5. Heart: Regular rate and rhythm no  Murmur, no Rub or gallop 6. Lungs:  no wheezes or crackles   7. Abdomen: Soft,  non-tender, Non distended   obese bowel sounds present 8. Lower extremities: no clubbing, cyanosis, no  edema 9. Neurologically Grossly intact, moving all 4 extremities equally   10. MSK: Normal range of motion    Chart has been reviewed  ______________________________________________________________________________________________  Assessment/Plan 65 y.o. male with medical history significant of DM1, HTN melanoma  CVA, gastroparesis   Admitted for CAP vs COVID pNA  Present on Admission:  Pneumonia due to COVID-19 virus versus CAP -  - -Patient presenting with  productive cough, fever hypoxia  , and infiltrate    -This appears to be most likely community-acquired pneumonia vs COVID PNA And remdesivir and steroids Inflammatory markers noted to be elevated Given elevated procalcitonin COVID antibiotics  will admit for treatment of CAP will start on appropriate  antibiotic coverage. -Rocephin/azithromycin   Obtain:  sputum cultures,                  blood cultures and sputum cultures ordered                   strep pneumo UA antigen,                   check for Legionella antigen.                Provide oxygen as needed.    Acute respiratory failure with hypoxia -  this patient has acute respiratory failure with Hypoxia and  documented by the presence of following: O2 saturatio< 90% on RA   Likely due to:   Pneumonia, COVID pneumonia Provide O2 therapy and titrate as needed  Continuous pulse ox   check Pulse ox with ambulation prior to discharge   may need  TC consult for home O2 set up   Prone if able  flutter valve ordered Given pleuritic chest pain and elevated D-dimer ordered CTA but patient needs to be premedicated   Gastroparesis due to secondary diabetes ([place]) -restart Reglan unable to tolerate   Essential hypertension hold hydrochlorothiazide lisinopril given AKI   AKI (acute kidney injury) ([place]) -we will gently rehydrate and follow-up the urine electrolytes   Anemia of chronic disease chronic stable   Hypokalemia - - will replace and repeat in AM,  check magnesium level and replace as needed   Protein-calorie malnutrition, severe check prealbumin order nutritional consult   Other plan as per orders.  DVT prophylaxis:   Lovenox      Code Status:    Code Status: Prior FULL CODE  as per patient   I had personally discussed CODE STATUS with patient      Family Communication:   Family not at  Bedside    Disposition Plan:     To home once workup is complete and patient is stable   Following barriers for discharge:                            Electrolytes corrected  Afebrile, white count improving able to transition to PO antibiotics                             Will need to be able to tolerate PO                            Will likely need home health, home O2, set up                                              Would benefit from PT/OT eval prior to DC                      Consults called none  Admission status:  ED Disposition     ED Disposition  Admit   Condition  --   [place]: [place] [[id number]]  Level of Care: Telemetry [5]  Admit to tele based on following criteria: Other see comments  Comments: covid  May admit patient to [facility] or [facility] if equivalent level of care is available:: No  Covid Evaluation: Confirmed COVID Positive  Diagnosis: Pneumonia due to COVID-19 virus [[id number]]  Admitting Physician: Toy Baker [[id number]]  Attending Physician: Toy Baker [[id number]]  Estimated length of stay: past midnight tomorrow  Certification:: I certify this patient will need inpatient services for at least 2 midnights            inpatient     I Expect 2 midnight stay secondary to severity of patient's current illness need for inpatient interventions justified by the following:  hemodynamic instability despite optimal treatment (tachycardia  hypoxia, )  Severe lab/radiological/exam abnormalities including:    CAP and extensive comorbidities including:    DM1    CHF    CKD  malignancy,    That are currently affecting medical management.   I expect  patient to be hospitalized for 2 midnights requiring inpatient medical care.  Patient is at high risk for adverse outcome (such as loss of life or disability) if not treated.  Indication for inpatient stay as follows:     Need for IV antibiotics, IV fluids,     Level of care     tele  For 12H     Lab Results  Component Value Date   [place] NEGATIVE 08/27/2020     Precautions: admitted as covid positive Airborne and Contact precautions      PPE: Used by the provider:   N95  eye Goggles,  Gloves  gown      Corydon Schweiss 01/23/2021, 1:02 AM    [facility]     after 2 AM please page floor  coverage PA If 7AM-7PM, please contact the day team taking care of the patient using [email or web address]   Patient was evaluated in the context of the global COVID-19 pandemic, which necessitated consideration that the patient might be at risk for infection with the SARS-CoV-2 virus that causes COVID-19. Institutional protocols and algorithms that pertain to the evaluation of patients at risk for COVID-19 are in a state of rapid change based on information released by regulatory bodies including the CDC and federal and state organizations. These policies and algorithms were followed during the patient's care.

## 2021-01-23 NOTE — ED Provider Notes (Signed)
[facility] DEPT Provider Note   CSN: [id number] Arrival date & time: 01/23/21  1845     History Chief Complaint  Patient presents with   Shortness of Breath    Jesse Lane is a 65 y.o. male hx of DM, HTN, melanoma, here presenting with shortness of breath and diarrhea.  Patient has been having sore throat and shortness of breath for the last 3 to 4 days.  Patient also has some diarrhea as well. Patient is fully vaccinated against COVID and had his booster shot. Patient called EMS for shortness of breath and was noted to be hypoxic.  A rapid antigen test was performed by EMS that was positive.   The history is provided by the patient.      Past Medical History:  Diagnosis Date   Arthritis    Diabetes mellitus type 2, uncontrolled    Diabetic neuropathy (HCC)    GERD (gastroesophageal reflux disease)    History of stomach ulcers    Hypertension    Melanoma (Jesse Lane)    mets to lymph node and intestines    Patient Active Problem List   Diagnosis Date Noted   AKI (acute kidney injury) (Jesse Lane) 08/27/2020   Hypertensive urgency 08/27/2020   Gastroparesis due to secondary diabetes (Jesse Lane) 08/27/2020   Stroke (Jesse Lane) 10/16/2019   Multinodular goiter 12/27/2016   Diabetes (Jesse Lane) 11/26/2016   Protein-calorie malnutrition, severe 09/17/2016   DKA (diabetic ketoacidoses) 09/17/2016   Diabetic ketoacidosis without coma associated with type 2 diabetes mellitus (Jesse Lane)    Fecal impaction in rectum (Jesse Lane)    Hyperglycemia 09/16/2016   Hyperglycemic hyperosmolar nonketotic coma (Jesse Lane) 09/16/2016   High anion gap metabolic acidosis 59/93/5701   Lactic acidosis 09/16/2016   Anemia of chronic disease 09/16/2016   Diabetic neuropathy (Jesse Lane) 09/06/2016   Essential hypertension 09/06/2016   Malignant melanoma of axilla (Jesse Lane) 08/12/2016    Past Surgical History:  Procedure Laterality Date   ABCESS DRAINAGE     gluteal area   COLONOSCOPY     INGUINAL HERNIA  REPAIR N/A 08/15/2015   Procedure: HERNIA REPAIR INGUINAL INCARCERATED;  Surgeon: Alphonsa Overall, MD;  Location: [facility];  Service: General;  Laterality: N/A;  with MESH   pyloric stenosis     TONSILLECTOMY         Family History  Problem Relation Age of Onset   Breast cancer Mother    Bone cancer Mother    Melanoma Mother    Prostate cancer Father    Pancreatic cancer Maternal Grandfather    Diabetes Paternal Aunt    Colon cancer Neg Hx    Colon polyps Neg Hx    Esophageal cancer Neg Hx    Stomach cancer Neg Hx     Social History   Tobacco Use   Smoking status: Former    Packs/day: 1.00    Years: 5.00    Pack years: 5.00    Types: Cigarettes   Smokeless tobacco: Never  Vaping Use   Vaping Use: Never used  Substance Use Topics   Alcohol use: Yes    Alcohol/week: 2.0 standard drinks    Types: 2 Glasses of wine per week    Comment: occasionally   Drug use: Not Currently    Types: Marijuana    Comment: daily    Home Medications Prior to Admission medications   Medication Sig Start Date End Date Taking? Authorizing Provider  acetaminophen (TYLENOL) 500 MG tablet Take 1,000 mg by mouth 2 (two) times  daily as needed for mild pain or headache (sleep).     [provider]  alum & mag hydroxide-simeth (MAALOX/MYLANTA) 200-200-20 MG/5ML suspension Take 15 mLs by mouth every 4 (Jesse) hours as needed for indigestion or heartburn. 08/29/20   Aline August, MD  amLODipine (NORVASC) 10 MG tablet Take 10 mg by mouth daily. 11/28/19   [provider]  aspirin EC 81 MG EC tablet Take 1 tablet (81 mg total) by mouth daily. Swallow whole. 10/19/19   Rinehuls, Early Chars, PA-C  atorvastatin (LIPITOR) 40 MG tablet Take 1 tablet (40 mg total) by mouth daily. 10/19/19   Rinehuls, Early Chars, PA-C  Continuous Blood Gluc Receiver (Jesse Lane) DEVI by Does not apply route.    [provider]  Continuous Blood Gluc Sensor (DEXCOM G6 SENSOR) MISC by Does not apply route.     [provider]  Continuous Blood Gluc Transmit (DEXCOM G6 TRANSMITTER) MISC by Does not apply route.    [provider]  hydrochlorothiazide (HYDRODIURIL) 25 MG tablet Take 25 mg by mouth at bedtime.    [provider]  insulin degludec (TRESIBA FLEXTOUCH) 100 UNIT/ML FlexTouch Pen Inject 24 Units into the skin daily before breakfast.    [provider]  insulin lispro (HUMALOG) 100 UNIT/ML KwikPen Inject 10-12 Units into the skin 3 (three) times daily. Injection 10-12 units three times daily before meals as needed: for CBG 180-200 10 units, >200 12 units . Sliding scale    [provider]  Insulin Pen Needle (BD PEN NEEDLE MICRO U/F) 32G X 6 MM MISC 1 each by Other route See admin instructions. Use one pen needle to inject insulin 4 times daily. **must have appointment for additional refills.** 06/30/18   Renato Shin, MD  Insulin Pen Needle (BD PEN NEEDLE MICRO U/F) 32G X 6 MM MISC USE 1 PEN NEEDLE SIX TIMES DAILY Patient taking differently: 1 each by Other route See admin instructions. USE 1 PEN NEEDLE SIX TIMES DAILY 07/02/18   Renato Shin, MD  lisinopril (PRINIVIL,ZESTRIL) 40 MG tablet Take 40 mg by mouth at bedtime.  07/24/16   [provider]  metoCLOPramide (REGLAN) 10 MG tablet Take 1 tablet (10 mg total) by mouth 4 (Jesse) times daily -  before meals and at bedtime. 10/28/20   Levin Erp, PA  montelukast (SINGULAIR) 10 MG tablet Take 10 mg by mouth daily. 10/13/19   [provider]  Nebivolol HCl (BYSTOLIC) 20 MG TABS Take 20 mg by mouth at bedtime.    [provider]  ondansetron (ZOFRAN) 4 MG tablet Take 1 tablet (4 mg total) by mouth every 6 (six) hours as needed for nausea. 09/21/20   Levin Erp, PA  pantoprazole (PROTONIX) 40 MG tablet Take 1 tablet by mouth twice daily Patient taking differently: Take 40 mg by mouth daily. 05/04/20   Milus Banister, MD  pregabalin (LYRICA) 100 MG capsule Take  100-300 mg by mouth See admin instructions. Take 2 capsule (200 mg) by mouth every morning and take three capsules (300 mg) at bedtime 10/15/19   [provider]  sucralfate (CARAFATE) 1 GM/10ML suspension Take 2 g by mouth 3 (three) times daily.    [provider]  traZODone (DESYREL) 100 MG tablet Take 100 mg by mouth at bedtime. 07/24/16   [provider]    Allergies    Contrast media [iodinated diagnostic agents]  Review of Systems   Review of Systems  Respiratory:  Positive for  shortness of breath.   All other systems reviewed and are negative.  Physical Exam Updated Vital Signs BP (!) 150/63 (BP Location: Left Arm)   Pulse 66   Temp 100 F (37.8 C) (Oral)   Resp 18   Ht 5\' 11"  (1.803 m)   Wt 93 kg   SpO2 97%   BMI 28.59 kg/m   Physical Exam Vitals and nursing note reviewed.  HENT:     Head: Normocephalic.     Mouth/Throat:     Pharynx: Oropharynx is clear.  Eyes:     Extraocular Movements: Extraocular movements intact.     Pupils: Pupils are equal, round, and reactive to light.  Cardiovascular:     Rate and Rhythm: Normal rate and regular rhythm.  Pulmonary:     Comments: Tachypneic and diminished throughout.  Mild diffuse wheezing Abdominal:     General: Bowel sounds are normal.     Palpations: Abdomen is soft.  Musculoskeletal:        General: Normal range of motion.     Cervical back: Normal range of motion and neck supple.  Skin:    General: Skin is warm.     Capillary Refill: Capillary refill takes less than 2 seconds.  Neurological:     General: No focal deficit present.     Mental Status: He is alert and oriented to person, place, and time.  Psychiatric:        Mood and Affect: Mood normal.        Behavior: Behavior normal.    ED Results / Procedures / Treatments   Labs (all labs ordered are listed, but only abnormal results are displayed) Labs Reviewed  RESP PANEL BY RT-PCR (FLU A&B, COVID) ARPGX2  CULTURE, BLOOD  (ROUTINE X 2)  CULTURE, BLOOD (ROUTINE X 2)  LACTIC ACID, PLASMA  LACTIC ACID, PLASMA  CBC WITH DIFFERENTIAL/PLATELET  COMPREHENSIVE METABOLIC PANEL  D-DIMER, QUANTITATIVE  PROCALCITONIN  LACTATE DEHYDROGENASE  FERRITIN  TRIGLYCERIDES  FIBRINOGEN  C-REACTIVE PROTEIN  BLOOD GAS, VENOUS  I-STAT CHEM 8, ED    EKG EKG Interpretation  Date/Time:  Monday January 23 2021 19:52:41 EDT Ventricular Rate:  66 PR Interval:  233 QRS Duration: 104 QT Interval:  444 QTC Calculation: 466 R Axis:     Text Interpretation: EASI Derived Leads No significant change since last tracing Confirmed by Wandra Arthurs [id number]) on 01/23/2021 8:09:50 PM  Radiology No results found.  Procedures Procedures   CRITICAL CARE Performed by: Wandra Arthurs   Total critical care time: 30 minutes  Critical care time was exclusive of separately billable procedures and treating other patients.  Critical care was necessary to treat or prevent imminent or life-threatening deterioration.  Critical care was time spent personally by me on the following activities: development of treatment plan with patient and/or surrogate as well as nursing, discussions with consultants, evaluation of patient's response to treatment, examination of patient, obtaining history from patient or surrogate, ordering and performing treatments and interventions, ordering and review of laboratory studies, ordering and review of radiographic studies, pulse oximetry and re-evaluation of patient's condition.   Medications Ordered in ED Medications  dexamethasone (DECADRON) injection 8 mg (has no administration in time range)  remdesivir 100 mg in sodium chloride 0.9 % 100 mL IVPB (has no administration in time range)    Followed by  remdesivir 100 mg in sodium chloride 0.9 % 100 mL IVPB (has no administration in time range)    Followed by  remdesivir 100  mg in sodium chloride 0.9 % 100 mL IVPB (has no administration in time range)    ED  Course  I have reviewed the triage vital signs and the nursing notes.  Pertinent labs & imaging results that were available during my care of the patient were reviewed by me and considered in my medical decision making (see chart for details).    MDM Rules/Calculators/A&P                           DEKENDRICK UZELAC is a 65 y.o. male here with shortness of breath and diarrhea.  Patient apparently has a positive COVID antigen test.  Patient is fully vaccinated against COVID and had a booster as well.  Since he is hypoxic, will give steroids and remdesivir.  We will get COVID preadmission and labs and chest x-ray    10 PM COVID positive. CXR showed possible pneumonia but his WBC is normal. He is hypoxic and is on 4 L [place]. Inflammatory markers elevated. Ordered decadron and remdesivir. Hospitalist to admit. Hospitalist request CTA chest. He needed premedication for it and it is pending at time of admission   Final Clinical Impression(s) / ED Diagnoses Final diagnoses:  None    Rx / DC Orders ED Discharge Orders     None        Drenda Freeze, MD 01/24/21 [phone]

## 2021-01-24 ENCOUNTER — Ambulatory Visit: Payer: Medicare Other | Admitting: Physician Assistant

## 2021-01-24 ENCOUNTER — Inpatient Hospital Stay (HOSPITAL_COMMUNITY): Payer: Medicare Other

## 2021-01-24 DIAGNOSIS — D638 Anemia in other chronic diseases classified elsewhere: Secondary | ICD-10-CM | POA: Diagnosis not present

## 2021-01-24 DIAGNOSIS — E876 Hypokalemia: Secondary | ICD-10-CM | POA: Diagnosis present

## 2021-01-24 DIAGNOSIS — U071 COVID-19: Secondary | ICD-10-CM | POA: Diagnosis not present

## 2021-01-24 DIAGNOSIS — J9601 Acute respiratory failure with hypoxia: Secondary | ICD-10-CM | POA: Diagnosis present

## 2021-01-24 DIAGNOSIS — I10 Essential (primary) hypertension: Secondary | ICD-10-CM | POA: Diagnosis not present

## 2021-01-24 DIAGNOSIS — N179 Acute kidney failure, unspecified: Secondary | ICD-10-CM | POA: Diagnosis not present

## 2021-01-24 DIAGNOSIS — J9600 Acute respiratory failure, unspecified whether with hypoxia or hypercapnia: Secondary | ICD-10-CM | POA: Diagnosis present

## 2021-01-24 DIAGNOSIS — C4359 Malignant melanoma of other part of trunk: Secondary | ICD-10-CM

## 2021-01-24 LAB — PROCALCITONIN: Procalcitonin: 23.31 ng/mL

## 2021-01-24 LAB — CBC WITH DIFFERENTIAL/PLATELET
Abs Immature Granulocytes: 0.05 10*3/uL (ref 0.00–0.07)
Basophils Absolute: 0 10*3/uL (ref 0.0–0.1)
Basophils Relative: 0 %
Eosinophils Absolute: 0 10*3/uL (ref 0.0–0.5)
Eosinophils Relative: 0 %
HCT: 34.7 % — ABNORMAL LOW (ref 39.0–52.0)
Hemoglobin: 11.5 g/dL — ABNORMAL LOW (ref 13.0–17.0)
Immature Granulocytes: 1 %
Lymphocytes Relative: 8 %
Lymphs Abs: 0.7 10*3/uL (ref 0.7–4.0)
MCH: 30.3 pg (ref 26.0–34.0)
MCHC: 33.1 g/dL (ref 30.0–36.0)
MCV: 91.6 fL (ref 80.0–100.0)
Monocytes Absolute: 0.2 10*3/uL (ref 0.1–1.0)
Monocytes Relative: 2 %
Neutro Abs: 8.1 10*3/uL — ABNORMAL HIGH (ref 1.7–7.7)
Neutrophils Relative %: 89 %
Platelets: 125 10*3/uL — ABNORMAL LOW (ref 150–400)
RBC: 3.79 MIL/uL — ABNORMAL LOW (ref 4.22–5.81)
RDW: 14.4 % (ref 11.5–15.5)
WBC: 9.1 10*3/uL (ref 4.0–10.5)
nRBC: 0 % (ref 0.0–0.2)

## 2021-01-24 LAB — COMPREHENSIVE METABOLIC PANEL
ALT: 17 U/L (ref 0–44)
AST: 21 U/L (ref 15–41)
Albumin: 3.3 g/dL — ABNORMAL LOW (ref 3.5–5.0)
Alkaline Phosphatase: 40 U/L (ref 38–126)
Anion gap: 10 (ref 5–15)
BUN: 39 mg/dL — ABNORMAL HIGH (ref 8–23)
CO2: 23 mmol/L (ref 22–32)
Calcium: 7.6 mg/dL — ABNORMAL LOW (ref 8.9–10.3)
Chloride: 105 mmol/L (ref 98–111)
Creatinine, Ser: 1.65 mg/dL — ABNORMAL HIGH (ref 0.61–1.24)
GFR, Estimated: 46 mL/min — ABNORMAL LOW (ref >60)
Glucose, Bld: 298 mg/dL — ABNORMAL HIGH (ref 70–99)
Potassium: 3.7 mmol/L (ref 3.5–5.1)
Sodium: 138 mmol/L (ref 135–145)
Total Bilirubin: 1 mg/dL (ref 0.3–1.2)
Total Protein: 6.5 g/dL (ref 6.5–8.1)

## 2021-01-24 LAB — GLUCOSE, CAPILLARY
Glucose-Capillary: 274 mg/dL — ABNORMAL HIGH (ref 70–99)
Glucose-Capillary: 280 mg/dL — ABNORMAL HIGH (ref 70–99)
Glucose-Capillary: 265 mg/dL — ABNORMAL HIGH (ref 70–99)
Glucose-Capillary: 293 mg/dL — ABNORMAL HIGH (ref 70–99)
Glucose-Capillary: 295 mg/dL — ABNORMAL HIGH (ref 70–99)
Glucose-Capillary: 255 mg/dL — ABNORMAL HIGH (ref 70–99)

## 2021-01-24 LAB — EXPECTORATED SPUTUM ASSESSMENT W GRAM STAIN, RFLX TO RESP C

## 2021-01-24 LAB — MAGNESIUM
Magnesium: 1.9 mg/dL (ref 1.7–2.4)
Magnesium: 2 mg/dL (ref 1.7–2.4)

## 2021-01-24 LAB — PHOSPHORUS
Phosphorus: 4.7 mg/dL — ABNORMAL HIGH (ref 2.5–4.6)
Phosphorus: 3.4 mg/dL (ref 2.5–4.6)

## 2021-01-24 LAB — HEMOGLOBIN A1C
Hgb A1c MFr Bld: 5.9 % — ABNORMAL HIGH (ref 4.8–5.6)
Mean Plasma Glucose: 122.63 mg/dL

## 2021-01-24 LAB — D-DIMER, QUANTITATIVE: D-Dimer, Quant: 1.12 ug/mL-FEU — ABNORMAL HIGH (ref 0.00–0.50)

## 2021-01-24 LAB — TROPONIN I (HIGH SENSITIVITY)
Troponin I (High Sensitivity): 340 ng/L (ref <18)
Troponin I (High Sensitivity): 332 ng/L (ref <18)

## 2021-01-24 LAB — LACTIC ACID, PLASMA: Lactic Acid, Venous: 1.3 mmol/L (ref 0.5–1.9)

## 2021-01-24 LAB — CK: Total CK: 194 U/L (ref 49–397)

## 2021-01-24 LAB — HIV ANTIBODY (ROUTINE TESTING W REFLEX): HIV Screen 4th Generation wRfx: NONREACTIVE

## 2021-01-24 LAB — C-REACTIVE PROTEIN: CRP: 23.1 mg/dL — ABNORMAL HIGH (ref <1.0)

## 2021-01-24 LAB — PREALBUMIN: Prealbumin: 15.5 mg/dL — ABNORMAL LOW (ref 18–38)

## 2021-01-24 LAB — FERRITIN: Ferritin: 258 ng/mL (ref 24–336)

## 2021-01-24 LAB — ABO/RH: ABO/RH(D): O POS

## 2021-01-24 MED ORDER — SODIUM CHLORIDE 0.9 % IV SOLN: INTRAVENOUS | Status: AC

## 2021-01-24 MED ORDER — HYDRALAZINE HCL 25 MG PO TABS
25.0000 mg | ORAL_TABLET | Freq: Three times a day (TID) | ORAL | Status: DC
  Administered 2021-01-24 – 2021-01-25 (×3): 25 mg via ORAL
  Filled 2021-01-24 (×3): qty 1

## 2021-01-24 MED ORDER — HYDROCODONE-ACETAMINOPHEN 5-325 MG PO TABS: 1.0000 | ORAL_TABLET | ORAL | Status: DC | PRN

## 2021-01-24 MED ORDER — INSULIN GLARGINE-YFGN 100 UNIT/ML ~~LOC~~ SOLN
24.0000 [IU] | Freq: Every day | SUBCUTANEOUS | Status: DC
  Administered 2021-01-24 – 2021-01-27 (×4): 24 [IU] via SUBCUTANEOUS
  Filled 2021-01-24 (×4): qty 0.24

## 2021-01-24 MED ORDER — PROSOURCE PLUS PO LIQD
30.0000 mL | Freq: Two times a day (BID) | ORAL | Status: DC
  Administered 2021-01-25 – 2021-01-29 (×5): 30 mL via ORAL
  Filled 2021-01-24 (×6): qty 30

## 2021-01-24 MED ORDER — INSULIN ASPART 100 UNIT/ML IJ SOLN
0.0000 [IU] | Freq: Every day | INTRAMUSCULAR | Status: DC
  Administered 2021-01-24: 3 [IU] via SUBCUTANEOUS

## 2021-01-24 MED ORDER — ADULT MULTIVITAMIN W/MINERALS CH
1.0000 | ORAL_TABLET | Freq: Every day | ORAL | Status: DC
  Administered 2021-01-24 – 2021-01-29 (×6): 1 via ORAL
  Filled 2021-01-24 (×6): qty 1

## 2021-01-24 MED ORDER — POTASSIUM CHLORIDE CRYS ER 20 MEQ PO TBCR
20.0000 meq | EXTENDED_RELEASE_TABLET | Freq: Once | ORAL | Status: AC
  Administered 2021-01-24: 20 meq via ORAL
  Filled 2021-01-24: qty 1

## 2021-01-24 MED ORDER — ONDANSETRON HCL 4 MG/2ML IJ SOLN: 4.0000 mg | Freq: Four times a day (QID) | INTRAMUSCULAR | Status: DC | PRN

## 2021-01-24 MED ORDER — ENOXAPARIN SODIUM 40 MG/0.4ML IJ SOSY
40.0000 mg | PREFILLED_SYRINGE | INTRAMUSCULAR | Status: DC
  Administered 2021-01-25 – 2021-01-29 (×5): 40 mg via SUBCUTANEOUS
  Filled 2021-01-24 (×6): qty 0.4

## 2021-01-24 MED ORDER — MONTELUKAST SODIUM 10 MG PO TABS
10.0000 mg | ORAL_TABLET | Freq: Every day | ORAL | Status: DC
  Administered 2021-01-24 – 2021-01-29 (×6): 10 mg via ORAL
  Filled 2021-01-24 (×6): qty 1

## 2021-01-24 MED ORDER — METHYLPREDNISOLONE SODIUM SUCC 125 MG IJ SOLR
0.5000 mg/kg | Freq: Two times a day (BID) | INTRAMUSCULAR | Status: DC
  Administered 2021-01-24: 46.25 mg via INTRAVENOUS
  Filled 2021-01-24: qty 2

## 2021-01-24 MED ORDER — PANTOPRAZOLE SODIUM 40 MG PO TBEC
40.0000 mg | DELAYED_RELEASE_TABLET | Freq: Every day | ORAL | Status: DC
  Administered 2021-01-24 – 2021-01-29 (×6): 40 mg via ORAL
  Filled 2021-01-24 (×6): qty 1

## 2021-01-24 MED ORDER — ACETAMINOPHEN 325 MG PO TABS: 650.0000 mg | ORAL_TABLET | Freq: Four times a day (QID) | ORAL | Status: DC | PRN

## 2021-01-24 MED ORDER — AMLODIPINE BESYLATE 10 MG PO TABS
10.0000 mg | ORAL_TABLET | Freq: Every day | ORAL | Status: DC
  Administered 2021-01-24 – 2021-01-29 (×6): 10 mg via ORAL
  Filled 2021-01-24 (×6): qty 1

## 2021-01-24 MED ORDER — NEBIVOLOL HCL 10 MG PO TABS
20.0000 mg | ORAL_TABLET | Freq: Every day | ORAL | Status: DC
  Administered 2021-01-24 – 2021-01-28 (×5): 20 mg via ORAL
  Filled 2021-01-24 (×6): qty 2

## 2021-01-24 MED ORDER — ONDANSETRON HCL 4 MG PO TABS: 4.0000 mg | ORAL_TABLET | Freq: Four times a day (QID) | ORAL | Status: DC | PRN

## 2021-01-24 MED ORDER — ATORVASTATIN CALCIUM 40 MG PO TABS
40.0000 mg | ORAL_TABLET | Freq: Every day | ORAL | Status: DC
  Administered 2021-01-24 – 2021-01-25 (×2): 40 mg via ORAL
  Filled 2021-01-24 (×2): qty 1

## 2021-01-24 MED ORDER — GUAIFENESIN-DM 100-10 MG/5ML PO SYRP
10.0000 mL | ORAL_SOLUTION | ORAL | Status: DC | PRN
  Administered 2021-01-25 – 2021-01-29 (×6): 10 mL via ORAL
  Filled 2021-01-24 (×6): qty 10

## 2021-01-24 MED ORDER — SODIUM CHLORIDE 0.9% FLUSH
3.0000 mL | Freq: Two times a day (BID) | INTRAVENOUS | Status: DC
  Administered 2021-01-24 – 2021-01-29 (×8): 3 mL via INTRAVENOUS

## 2021-01-24 MED ORDER — INSULIN ASPART 100 UNIT/ML IJ SOLN
0.0000 [IU] | Freq: Three times a day (TID) | INTRAMUSCULAR | Status: DC
  Administered 2021-01-25: 11 [IU] via SUBCUTANEOUS
  Administered 2021-01-25: 5 [IU] via SUBCUTANEOUS
  Administered 2021-01-25: 15 [IU] via SUBCUTANEOUS

## 2021-01-24 MED ORDER — PREDNISONE 50 MG PO TABS: 50.0000 mg | ORAL_TABLET | Freq: Every day | ORAL | Status: DC

## 2021-01-24 MED ORDER — ENSURE MAX PROTEIN PO LIQD
11.0000 [oz_av] | Freq: Every day | ORAL | Status: DC
  Administered 2021-01-25 – 2021-01-29 (×4): 11 [oz_av] via ORAL
  Filled 2021-01-24 (×6): qty 330

## 2021-01-24 NOTE — Progress Notes (Signed)
Initial Nutrition Assessment  INTERVENTION:   -Prostat liquid protein PO 30 ml BID with meals, each supplement provides 100 kcal, 15 grams protein.   -Ensure MAX Protein po BID, each supplement provides 150 kcal and 30 grams of protein   -Multivitamin with minerals daily  NUTRITION DIAGNOSIS:   Increased nutrient needs related to acute illness as evidenced by estimated needs.  GOAL:   Patient will meet greater than or equal to 90% of their needs   MONITOR:   PO intake, Supplement acceptance, Labs, Weight trends, I & O's  REASON FOR ASSESSMENT:   Consult Assessment of nutrition requirement/status  ASSESSMENT:   65 y.o. male with medical history significant of DM1, HTN melanoma   CVA, gastroparesis     Presented with   shortness of breath and diarrhea. sore throat for the last 3-4 days. COVID+  Patient reporting continued diarrhea and sore throat. Pt with elevated CBGs, could be related to current COVID-19 infection.  Patient had struggled with gastroparesis symptoms since March 2022. Pt has had a poor appetite as a result.  Will order high protein supplements, Ensure Max and Prosource given increased needs.  Per weight records, pt with insignificant weight changes over the past year. Weight has overall increased since July 2021.   Medications reviewed.  Labs reviewed:  CBGs: 265-293  NUTRITION - FOCUSED PHYSICAL EXAM:  Deferred, will attempt at a later time.  Diet Order:   Diet Order             Diet Heart Room service appropriate? Yes; Fluid consistency: Thin  Diet effective now                   EDUCATION NEEDS:   No education needs have been identified at this time  Skin:  Skin Assessment: Reviewed RN Assessment  Last BM:  10/18  Height:   Ht Readings from Last 1 Encounters:  01/23/21 5\' 11"  (1.803 m)    Weight:   Wt Readings from Last 1 Encounters:  01/23/21 93 kg    BMI:  Body mass index is 28.59 kg/m.  Estimated Nutritional  Needs:   Kcal:  2000-2200  Protein:  100-115g  Fluid:  2L/day  Clayton Bibles, MS, RD, LDN Inpatient Clinical Dietitian Contact information available via Amion

## 2021-01-24 NOTE — Progress Notes (Signed)
    OVERNIGHT PROGRESS REPORT  Notified by RN for patient concern over a medication and a test(imaging).  Spoke with patient who is concerned about taking steroids as he has had issue in the past with extremely high blood sugars and mentions that one of his regular physicians advised to, in his words "not to take these medications". For this, nursing will hold the start of this medication (0600hrs dose) for him to discuss with his physician before starting as he is amenable to that and otherwise declines the medication regimen.  Also, he conveys that he is "not to take contrast media even with Benadryl" also reportedly from his prior physician visits. He is aware of the importance of, and improvement of testing results with this agent but declines the test in this manner.   Current order is written (w and/or w/o).He does accept testing without contrast and is aware of the need for the test ideally with contrast but is only amenable to the test (imaging) without contrast.     Gershon Cull MSNA MSN ACNPC-AG Acute Care Nurse Practitioner [place]

## 2021-01-24 NOTE — Progress Notes (Signed)
Pt had episode of hemoptysis, Notified on call Olena Heckle and specimen collected, will continue to monitor.

## 2021-01-24 NOTE — Progress Notes (Signed)
PROGRESS NOTE  Jesse Lane  DJT:[id number] DOB: [birth year] DOA: 01/23/2021 PCP: [name], MD  Outpatient Specialists: Mostly in [place] Brief Narrative: Jesse Lane is a 65 y.o. male with a history of IDT2DM, gastroparesis, HTN, melanoma metastases to lymph nodes and intestines with unclear primary source s/p [place] who presented to the ED 10/17 with profuse diarrhea worsening over several days as well as worsening shortness of breath for 1 day, all beginning several days after covid exposure. SARS-CoV-2 Ag was confirmed to be positive   Assessment & Plan: Active Problems:   Malignant melanoma of axilla (HCC)   Essential hypertension   Anemia of chronic disease   Protein-calorie malnutrition, severe   AKI (acute kidney injury) ([place])   Gastroparesis due to secondary diabetes ([place])   Pneumonia due to COVID-19 virus   Hypokalemia   Acute respiratory failure due to COVID-19 [facility])  Acute hypoxic respiratory failure: Due to CAP of LLL definitely, also possibly covid-19 pneumonia.  - CXR in AM - Wean oxygen as tolerated with treatment as below.   LLL pneumonia: PCT is 23 suggestive that this is primary driver of CRP elevation with mild d-dimer elevation.  - Continue ceftriaxone, azithromycin - Monitor blood and sputum cultures - Urinary antigens pending.   Covid-19 pneumonia and gastroenteritis: Symptoms overall improving. LFTs ok, mild thrombocytopenia noted likely due to viral infection.  - Covid contributing to hypoxia, so would be typically continued on steroids, though he has a history of severe DKA due to prednisone, and in fact has high glucose currently. He also has bacterial pneumonia which is primary driver of hypoxia, so we will withhold steroids for right now. He did receive decadron, hydrocortisone, and solumedrol this morning.  - Continue remdesivir  Hemoptysis: In setting of pneumonia and thrombocytopenia.  - Mild volume overall, will continue  monitoring, repeat imaging after improvement.  - Hold lovenox for now, trend platelets and clinical course, did advise that while not giving VTE ppx, he is certainly at risk of DVT/PE.  IDT2DM:  - Continue basal 24u and sliding scale correction novolog. Augment scale to resistant given recent steroids and typically requiring coverage of 7-9 units at home with well-controlled HbA1c (5.9%).  Gastroparesis:  - Reglan  AOCD: Stable  Hypokalemia: Improved with supplementation.  HTN:  - Continue norvasc, nebivolol - Give hydralazine tablet for high BP while holding thiazide and ACEi due to AKI.   AKI: Improving but not back to baseline.  - Give low level IVF overnight, repeat BMP.   Moderate protein calorie malnutrition:  - Supplement protein. Prealbumin low at 15.   Chronic HFpEF: Stable  Troponin elevation: No chest pain. ECG unchanged from prior. Doubt ACS.  - Defer further work up to outpatient setting.   DVT prophylaxis: SCDs Code Status: Full Family Communication: None at bedside Disposition Plan:  Status is: Inpatient  Remains inpatient appropriate because: Inpatient treatments required.  Consultants:  None  Procedures:  None  Antimicrobials: Ceftriaxone, azithromycin Remdesivir   Subjective: Diarrhea is much decreased from prior, eating a little bit better as well, had not been eating much. Difficulty breathing has improved over the course of the last few hours, but still coughing up small volume of thick sputum with blood in it.   Objective: Vitals:   01/24/21 0858 01/24/21 0915 01/24/21 1317 01/24/21 1726  BP: (!) 193/95  (!) 193/90   Pulse: 66     Resp: 20  (!) 22   Temp: 97.7 F (36.5 C)  98.2 F (36.8 C)   TempSrc: Oral  Oral   SpO2: 96% 95%  90%  Weight:      Height:        Intake/Output Summary (Last 24 hours) at 01/24/2021 1726 Last data filed at 01/24/2021 0600 Gross per 24 hour  Intake 652.9 ml  Output --  Net 652.9 ml   Filed Weights    01/23/21 1900  Weight: 93 kg   Gen: 66 y.o. male in no distress Pulm: Non-labored breathing supplemental oxygen, diffuse L > R crackles. Hypoventilating, when taking deep breath coughs.  CV: Regular rate and rhythm. No murmur, rub, or gallop. No JVD, no pedal edema. GI: Abdomen soft, non-tender, non-distended, with normoactive bowel sounds. No organomegaly or masses felt. Ext: Warm, no deformities Skin: No rashes, lesions or ulcers Neuro: Alert and oriented. No focal neurological deficits. Psych: Judgement and insight appear normal. Mood & affect appropriate.   Data Reviewed: I have personally reviewed following labs and imaging studies  CBC: Recent Labs  Lab 01/23/21 2028 01/23/21 2037 01/24/21 0334  WBC  --  10.7* 9.1  NEUTROABS  --  9.3* 8.1*  HGB 11.9* 12.0* 11.5*  HCT 35.0* 35.2* 34.7*  MCV  --  90.7 91.6  PLT  --  142* 786*   Basic Metabolic Panel: Recent Labs  Lab 01/23/21 2028 01/23/21 2037 01/24/21 0059 01/24/21 0334  NA 140 137  --  138  K 3.4* 3.4*  --  3.7  CL 101 101  --  105  CO2  --  25  --  23  GLUCOSE 209* 218*  --  298*  BUN 35* 39*  --  39*  CREATININE 1.80* 1.90*  --  1.65*  CALCIUM  --  8.0*  --  7.6*  MG  --   --  1.9 2.0  PHOS  --   --  4.7* 3.4   GFR: Estimated Creatinine Clearance: 51.3 mL/min (A) (by C-G formula based on SCr of 1.65 mg/dL (H)). Liver Function Tests: Recent Labs  Lab 01/23/21 2037 01/24/21 0334  AST 27 21  ALT 20 17  ALKPHOS 45 40  BILITOT 1.6* 1.0  PROT 7.3 6.5  ALBUMIN 3.8 3.3*   No results for input(s): LIPASE, AMYLASE in the last 168 hours. No results for input(s): AMMONIA in the last 168 hours. Coagulation Profile: No results for input(s): INR, PROTIME in the last 168 hours. Cardiac Enzymes: Recent Labs  Lab 01/24/21 0059  CKTOTAL 194   BNP (last 3 results) No results for input(s): PROBNP in the last 8760 hours. HbA1C: Recent Labs    01/24/21 0108  HGBA1C 5.9*   CBG: Recent Labs  Lab  01/24/21 0130 01/24/21 0400 01/24/21 0756 01/24/21 1147 01/24/21 1617  GLUCAP 274* 280* 265* 293* 295*   Lipid Profile: Recent Labs    01/23/21 2038  TRIG 73   Thyroid Function Tests: No results for input(s): TSH, T4TOTAL, FREET4, T3FREE, THYROIDAB in the last 72 hours. Anemia Panel: Recent Labs    01/23/21 2000 01/23/21 2038  FERRITIN 258 266   Urine analysis:    Component Value Date/Time   COLORURINE YELLOW 08/27/2020 1930   APPEARANCEUR CLEAR 08/27/2020 1930   LABSPEC 1.023 08/27/2020 1930   PHURINE 5.0 08/27/2020 1930   GLUCOSEU 50 (A) 08/27/2020 1930   HGBUR MODERATE (A) 08/27/2020 1930   BILIRUBINUR NEGATIVE 08/27/2020 1930   KETONESUR NEGATIVE 08/27/2020 1930   PROTEINUR >=300 (A) 08/27/2020 1930   UROBILINOGEN 0.2 12/05/2011 1655  NITRITE NEGATIVE 08/27/2020 1930   LEUKOCYTESUR NEGATIVE 08/27/2020 1930   Recent Results (from the past 240 hour(s))  Blood Culture (routine x 2)     Status: None (Preliminary result)   Collection Time: 01/23/21  8:39 PM   Specimen: Site Not Specified; Blood  Result Value Ref Range Status   Specimen Description   Final    SITE NOT SPECIFIED BLOOD Performed at [facility], [place] [address] [place], [place] [address]    Special Requests   Final    Blood Culture adequate volume BOTTLES DRAWN AEROBIC AND ANAEROBIC Performed at [facility] [address], [place], [place] [address]    Culture   Final    NO GROWTH < 12 HOURS Performed at [facility] [address], [place], [place] [address]    Report Status PENDING  Incomplete  Blood Culture (routine x 2)     Status: None (Preliminary result)   Collection Time: 01/23/21  8:40 PM   Specimen: Site Not Specified; Blood  Result Value Ref Range Status   Specimen Description   Final    SITE NOT SPECIFIED BLOOD Performed at [facility], [place] [address] [place], [place] [address]    Special Requests   Final    BOTTLES DRAWN AEROBIC AND  ANAEROBIC Blood Culture adequate volume Performed at [facility] [address], [place], [place] [address]    Culture   Final    NO GROWTH < 12 HOURS Performed at [facility] [address], [place], [place] [address]    Report Status PENDING  Incomplete  Resp Panel by RT-PCR (Flu A&B, Covid) Nasopharyngeal Swab     Status: Abnormal   Collection Time: 01/23/21  8:45 PM   Specimen: Nasopharyngeal Swab; Nasopharyngeal(NP) swabs in vial transport medium  Result Value Ref Range Status   SARS Coronavirus 2 by RT PCR POSITIVE (A) NEGATIVE Final    Comment: RESULT CALLED TO, READ BACK BY AND VERIFIED WITH:  Willeen Cass RN 01/23/21 @ 2255 (NOTE) SARS-CoV-2 target nucleic acids are DETECTED.  The SARS-CoV-2 RNA is generally detectable in upper respiratory specimens during the acute phase of infection. Positive results are indicative of the presence of the identified virus, but do not rule out bacterial infection or co-infection with other pathogens not detected by the test. Clinical correlation with patient history and other diagnostic information is necessary to determine patient infection status. The expected result is Negative.  Fact Sheet for Patients: EntrepreneurPulse.com.au  Fact Sheet for Healthcare Providers: IncredibleEmployment.be  This test is not yet approved or cleared by the [place] FDA and  has been authorized for detection and/or diagnosis of SARS-CoV-2 by FDA under an Emergency Use Authorization (EUA).  This EUA will remain in effect (meaning this test can b e used) for the duration of  the COVID-19 declaration under Section 564(b)(1) of the Act, 21 U.S.C. section 360bbb-3(b)(1), unless the authorization is terminated or revoked sooner.     Influenza A by PCR NEGATIVE NEGATIVE Final   Influenza B by PCR NEGATIVE NEGATIVE Final    Comment: (NOTE) The Xpert Xpress SARS-CoV-2/FLU/RSV plus assay  is intended as an aid in the diagnosis of influenza from Nasopharyngeal swab specimens and should not be used as a sole basis for treatment. Nasal washings and aspirates are unacceptable for Xpert Xpress SARS-CoV-2/FLU/RSV testing.  Fact Sheet for Patients: EntrepreneurPulse.com.au  Fact Sheet for Healthcare Providers: IncredibleEmployment.be  This test is not  yet approved or cleared by the [place] and has been authorized for detection and/or diagnosis of SARS-CoV-2 by FDA under an Emergency Use Authorization (EUA). This EUA will remain in effect (meaning this test can be used) for the duration of the COVID-19 declaration under Section 564(b)(1) of the Act, 21 U.S.C. section 360bbb-3(b)(1), unless the authorization is terminated or revoked.  Performed at [facility], [place] [address] [place], [place] [address]   Expectorated Sputum Assessment w Gram Stain, Rflx to Resp Cult     Status: None   Collection Time: 01/24/21  6:24 AM   Specimen: Expectorated Sputum  Result Value Ref Range Status   Specimen Description EXPECTORATED SPUTUM  Final   Special Requests NONE  Final   Sputum evaluation   Final    THIS SPECIMEN IS ACCEPTABLE FOR SPUTUM CULTURE Performed at [facility], [place] [address] [place], [place] [address]    Report Status 01/24/2021 FINAL  Final  Culture, Respiratory w Gram Stain     Status: None (Preliminary result)   Collection Time: 01/24/21  6:24 AM  Result Value Ref Range Status   Specimen Description   Final    EXPECTORATED SPUTUM Performed at [facility] [address] [place], [place] [address]    Special Requests   Final    NONE Reflexed from [id number] Performed at [facility], [place] [address] [place], [place] [address]    Gram Stain   Final    ABUNDANT WBC PRESENT,BOTH PMN AND MONONUCLEAR FEW GRAM NEGATIVE RODS FEW GRAM POSITIVE  COCCI RARE GRAM VARIABLE ROD Performed at [facility], [place] [address], [place], [place] [address]    Culture PENDING  Incomplete   Report Status PENDING  Incomplete      Radiology Studies: DG Chest Port 1 View  Result Date: 01/23/2021 CLINICAL DATA:  Fever, COVID. EXAM: PORTABLE CHEST 1 VIEW COMPARISON:  Chest x-ray 10/17/2019. FINDINGS: There is dense patchy airspace consolidation throughout the left mid lung. Costophrenic angles are clear. No pneumothorax. Cardiomediastinal silhouette within normal limits. Osseous structures are within normal limits. IMPRESSION: Multifocal airspace disease in the left mid lung compatible with pneumonia. Followup PA and lateral chest X-ray is recommended in 3-4 weeks following trial of antibiotic therapy to ensure resolution and exclude underlying malignancy. Electronically Signed   By: Ronney Asters M.D.   On: 01/23/2021 20:26    Scheduled Meds:  [START ON 01/25/2021] (feeding supplement) PROSource Plus  30 mL Oral BID BM   amLODipine  10 mg Oral Daily   atorvastatin  40 mg Oral Daily   diphenhydrAMINE  50 mg Oral Once   Or   diphenhydrAMINE  50 mg Intravenous Once   enoxaparin (LOVENOX) injection  40 mg Subcutaneous Q24H   hydrALAZINE  25 mg Oral Q8H   insulin aspart  0-9 Units Subcutaneous Q4H   insulin glargine-yfgn  24 Units Subcutaneous Daily   montelukast  10 mg Oral Daily   multivitamin with minerals  1 tablet Oral Daily   nebivolol  20 mg Oral QHS   pantoprazole  40 mg Oral Daily   Ensure Max Protein  11 oz Oral Daily   sodium chloride flush  3 mL Intravenous Q12H   Continuous Infusions:  azithromycin 500 mg (01/24/21 0017)   cefTRIAXone (ROCEPHIN)  IV 2 g (01/23/21 2324)   remdesivir 100 mg in NS 100 mL 100 mg (01/24/21 1030)     LOS: 1 day  Time spent: 35 minutes.  Patrecia Pour, MD [facility] [email or web address] 01/24/2021, 5:26 PM

## 2021-01-24 NOTE — TOC Initial Note (Signed)
Transition of Care [facility]) - Initial/Assessment Note    Patient Details  Name: Jesse Lane MRN: [id number] Date of Birth: 10-20-1955  [facility]) CM/SW Contact:    Leeroy Cha, RN Phone Number: 01/24/2021, 9:46 AM  Clinical Narrative:                  65 y.o. male with medical history significant of DM1, HTN melanoma  CVA, gastroparesis   Presented with   shortness of breath and diarrhea. sore throat for the last 3-4 days Noted to be hypoxic on EMS arrival  A rapid antigen test was performed by EMS that was positive.      KNOWN COVID POSITIVE    Has  been vaccinated against COVID and boosted    Initial COVID TEST    POSITIVE,   TOC PLAN OF CARE: To return to home with self care. Following for toc needs and progression.  Iv remdesivir through [id number].  Expected Discharge Plan: Home/Self Care Barriers to Discharge: Continued Medical Work up   Patient Goals and CMS Choice Patient states their goals for this hospitalization and ongoing recovery are:: to go home CMS Medicare.gov Compare Post Acute Care list provided to:: Patient Choice offered to / list presented to : Patient  Expected Discharge Plan and Services Expected Discharge Plan: Home/Self Care       Living arrangements for the past 2 months: Single Family Home                                      Prior Living Arrangements/Services Living arrangements for the past 2 months: Single Family Home Lives with:: Self   Do you feel safe going back to the place where you live?: Yes            Criminal Activity/Legal Involvement Pertinent to Current Situation/Hospitalization: No - Comment as needed  Activities of Daily Living Home Assistive Devices/Equipment: None ADL Screening (condition at time of admission) Patient's cognitive ability adequate to safely complete daily activities?: Yes Is the patient deaf or have difficulty hearing?: No Does the patient have difficulty seeing,  even when wearing glasses/contacts?: No Does the patient have difficulty concentrating, remembering, or making decisions?: No Patient able to express need for assistance with ADLs?: Yes Does the patient have difficulty dressing or bathing?: No Independently performs ADLs?: Yes (appropriate for developmental age) Does the patient have difficulty walking or climbing stairs?: No Weakness of Legs: None Weakness of Arms/Hands: None  Permission Sought/Granted                  Emotional Assessment Appearance:: Appears stated age     Orientation: : Oriented to Self, Oriented to Place, Oriented to  Time, Oriented to Situation Alcohol / Substance Use: Not Applicable Psych Involvement: No (comment)  Admission diagnosis:  Pneumonia due to COVID-19 virus [U07.1, J12.82] Patient Active Problem List   Diagnosis Date Noted   Hypokalemia 01/24/2021   Acute respiratory failure due to COVID-19 ([place]) 01/24/2021   Pneumonia due to COVID-19 virus 01/23/2021   AKI (acute kidney injury) ([place]) 08/27/2020   Hypertensive urgency 08/27/2020   Gastroparesis due to secondary diabetes ([place]) 08/27/2020   Stroke ([place]) 10/16/2019   Multinodular goiter 12/27/2016   Diabetes ([place]) 11/26/2016   Protein-calorie malnutrition, severe 09/17/2016   DKA (diabetic ketoacidoses) 09/17/2016   Diabetic ketoacidosis without coma associated with type 2 diabetes mellitus ([place])  Fecal impaction in rectum (HCC)    Hyperglycemia 09/16/2016   Hyperglycemic hyperosmolar nonketotic coma ([place]) 09/16/2016   High anion gap metabolic acidosis 53/61/4431   Lactic acidosis 09/16/2016   Anemia of chronic disease 09/16/2016   Diabetic neuropathy ([place]) 09/06/2016   Essential hypertension 09/06/2016   Malignant melanoma of axilla ([place]) 08/12/2016   PCP:  [name], MD Pharmacy:   [facility], [place] [address] Phone: [phone]  Fax: [phone]     Social Determinants of Health (SDOH) Interventions    Readmission Risk Interventions No flowsheet data found.

## 2021-01-25 DIAGNOSIS — N179 Acute kidney failure, unspecified: Secondary | ICD-10-CM | POA: Diagnosis not present

## 2021-01-25 DIAGNOSIS — I10 Essential (primary) hypertension: Secondary | ICD-10-CM | POA: Diagnosis not present

## 2021-01-25 DIAGNOSIS — U071 COVID-19: Secondary | ICD-10-CM | POA: Diagnosis not present

## 2021-01-25 DIAGNOSIS — D638 Anemia in other chronic diseases classified elsewhere: Secondary | ICD-10-CM | POA: Diagnosis not present

## 2021-01-25 LAB — CBC WITH DIFFERENTIAL/PLATELET
Abs Immature Granulocytes: 0.04 10*3/uL (ref 0.00–0.07)
Basophils Absolute: 0 10*3/uL (ref 0.0–0.1)
Basophils Relative: 0 %
Eosinophils Absolute: 0 10*3/uL (ref 0.0–0.5)
Eosinophils Relative: 0 %
HCT: 32.3 % — ABNORMAL LOW (ref 39.0–52.0)
Hemoglobin: 11.1 g/dL — ABNORMAL LOW (ref 13.0–17.0)
Immature Granulocytes: 1 %
Lymphocytes Relative: 9 %
Lymphs Abs: 0.7 10*3/uL (ref 0.7–4.0)
MCH: 30.8 pg (ref 26.0–34.0)
MCHC: 34.4 g/dL (ref 30.0–36.0)
MCV: 89.7 fL (ref 80.0–100.0)
Monocytes Absolute: 0.3 10*3/uL (ref 0.1–1.0)
Monocytes Relative: 5 %
Neutro Abs: 6.2 10*3/uL (ref 1.7–7.7)
Neutrophils Relative %: 85 %
Platelets: 139 10*3/uL — ABNORMAL LOW (ref 150–400)
RBC: 3.6 MIL/uL — ABNORMAL LOW (ref 4.22–5.81)
RDW: 14.4 % (ref 11.5–15.5)
WBC: 7.2 10*3/uL (ref 4.0–10.5)
nRBC: 0 % (ref 0.0–0.2)

## 2021-01-25 LAB — COMPREHENSIVE METABOLIC PANEL
ALT: 14 U/L (ref 0–44)
AST: 17 U/L (ref 15–41)
Albumin: 3 g/dL — ABNORMAL LOW (ref 3.5–5.0)
Alkaline Phosphatase: 36 U/L — ABNORMAL LOW (ref 38–126)
Anion gap: 10 (ref 5–15)
BUN: 49 mg/dL — ABNORMAL HIGH (ref 8–23)
CO2: 22 mmol/L (ref 22–32)
Calcium: 7.9 mg/dL — ABNORMAL LOW (ref 8.9–10.3)
Chloride: 109 mmol/L (ref 98–111)
Creatinine, Ser: 1.35 mg/dL — ABNORMAL HIGH (ref 0.61–1.24)
GFR, Estimated: 58 mL/min — ABNORMAL LOW (ref >60)
Glucose, Bld: 310 mg/dL — ABNORMAL HIGH (ref 70–99)
Potassium: 3.8 mmol/L (ref 3.5–5.1)
Sodium: 141 mmol/L (ref 135–145)
Total Bilirubin: 1.1 mg/dL (ref 0.3–1.2)
Total Protein: 6.4 g/dL — ABNORMAL LOW (ref 6.5–8.1)

## 2021-01-25 LAB — GLUCOSE, CAPILLARY
Glucose-Capillary: 123 mg/dL — ABNORMAL HIGH (ref 70–99)
Glucose-Capillary: 258 mg/dL — ABNORMAL HIGH (ref 70–99)
Glucose-Capillary: 264 mg/dL — ABNORMAL HIGH (ref 70–99)
Glucose-Capillary: 285 mg/dL — ABNORMAL HIGH (ref 70–99)
Glucose-Capillary: 329 mg/dL — ABNORMAL HIGH (ref 70–99)
Glucose-Capillary: 47 mg/dL — ABNORMAL LOW (ref 70–99)
Glucose-Capillary: 92 mg/dL (ref 70–99)

## 2021-01-25 LAB — D-DIMER, QUANTITATIVE: D-Dimer, Quant: 1.32 ug{FEU}/mL — ABNORMAL HIGH (ref 0.00–0.50)

## 2021-01-25 LAB — C-REACTIVE PROTEIN: CRP: 19 mg/dL — ABNORMAL HIGH (ref <1.0)

## 2021-01-25 LAB — PROCALCITONIN: Procalcitonin: 19.23 ng/mL

## 2021-01-25 MED ORDER — PREGABALIN 75 MG PO CAPS
200.0000 mg | ORAL_CAPSULE | Freq: Every day | ORAL | Status: DC
  Administered 2021-01-26 – 2021-01-29 (×4): 200 mg via ORAL
  Filled 2021-01-25 (×4): qty 1

## 2021-01-25 MED ORDER — HYDRALAZINE HCL 50 MG PO TABS
50.0000 mg | ORAL_TABLET | Freq: Three times a day (TID) | ORAL | Status: DC
  Administered 2021-01-25 – 2021-01-26 (×3): 50 mg via ORAL
  Filled 2021-01-25 (×3): qty 1

## 2021-01-25 MED ORDER — PREGABALIN 50 MG PO CAPS: 100.0000 mg | ORAL_CAPSULE | ORAL | Status: DC

## 2021-01-25 MED ORDER — TRAZODONE HCL 100 MG PO TABS
100.0000 mg | ORAL_TABLET | Freq: Every day | ORAL | Status: DC
  Administered 2021-01-25 – 2021-01-28 (×4): 100 mg via ORAL
  Filled 2021-01-25 (×4): qty 1

## 2021-01-25 MED ORDER — PREGABALIN 75 MG PO CAPS
300.0000 mg | ORAL_CAPSULE | Freq: Every day | ORAL | Status: DC
  Administered 2021-01-25 – 2021-01-28 (×4): 300 mg via ORAL
  Filled 2021-01-25 (×4): qty 4

## 2021-01-25 MED ORDER — PRAVASTATIN SODIUM 20 MG PO TABS
20.0000 mg | ORAL_TABLET | Freq: Every day | ORAL | Status: DC
  Administered 2021-01-26 – 2021-01-28 (×3): 20 mg via ORAL
  Filled 2021-01-25 (×3): qty 1

## 2021-01-25 MED ORDER — LINAGLIPTIN 5 MG PO TABS
5.0000 mg | ORAL_TABLET | Freq: Every day | ORAL | Status: DC
  Administered 2021-01-25 – 2021-01-29 (×5): 5 mg via ORAL
  Filled 2021-01-25 (×5): qty 1

## 2021-01-25 MED ORDER — HYDRALAZINE HCL 25 MG PO TABS
25.0000 mg | ORAL_TABLET | Freq: Once | ORAL | Status: AC
  Administered 2021-01-25: 25 mg via ORAL
  Filled 2021-01-25: qty 1

## 2021-01-25 NOTE — Progress Notes (Signed)
PROGRESS NOTE  Jesse Lane  NLG:[id number] DOB: January 01, 1956 DOA: 01/23/2021 PCP: [name], MD  Outpatient Specialists: Mostly in [place] Brief Narrative: Jesse Lane is a 65 y.o. male with a history of IDT2DM, gastroparesis, HTN, melanoma metastases to lymph nodes and intestines with unclear primary source s/p [place] who presented to the ED 10/17 with profuse diarrhea worsening over several days as well as worsening shortness of breath for 1 day, all beginning several days after covid exposure. SARS-CoV-2 Ag was confirmed to be positive   Assessment & Plan: Active Problems:   Malignant melanoma of axilla (HCC)   Essential hypertension   Anemia of chronic disease   Protein-calorie malnutrition, severe   AKI (acute kidney injury) ([place])   Gastroparesis due to secondary diabetes ([place])   Pneumonia due to COVID-19 virus   Hypokalemia   Acute respiratory failure due to COVID-19 [facility])  Acute hypoxic respiratory failure: Due to CAP of LLL definitely, also possibly covid-19 pneumonia. CXR shows increased diffuse airspace opacities. Does not otherwise appear volume overloaded. Will stop IVF. - Wean oxygen as tolerated with treatment as below.   LLL pneumonia: PCT is 23 suggestive that this is significant driver of CRP elevation with mild d-dimer elevation.  - Continue ceftriaxone, azithromycin. Symptomatically improving and PCT responding. - Monitor blood (NG2D) and sputum cultures (reincubated) - Urinary antigens pending.   Covid-19 pneumonia and gastroenteritis: Symptoms overall improving. LFTs ok, mild thrombocytopenia noted likely due to viral infection.  - Will hold steroids for now with symptomatic improvement, it is possible radiographic evidence is lagging clinical picture. He has a history of severe DKA due to prednisone, and in fact has high glucose currently which is a negative prognostic sign. He also has bacterial pneumonia which is primary driver of hypoxia, so  we will withhold steroids for right now. Low threshold to start steroids (e.g. decadron 6mg  daily) if clinically any worse. - Continue remdesivir x5 days planned.  Hemoptysis: In setting of pneumonia and thrombocytopenia.  - Mild volume overall, will continue monitoring, consider repeat imaging after improvement.  - Will continue lovenox for now with improved symptom/bleeding and stable platelets, remains at risk of DVT/PE.  IDT2DM:  - Continue basal 24u, augmented to resistant SSI which we will monitor CBGs and modify if needed. Chronically well-controlled HbA1c (5.9%). - Add linagliptin while admitted.  Gastroparesis:  - Pt no longer takes reglan  Abdominal bloating: Benign exam, having BMs.  - Consider XR or further work up if worsening.   AOCD: Stable  Hypokalemia: Improved with supplementation.  HTN:  - Continue norvasc, nebivolol - Started hydralazine for high BP while holding thiazide and ACEi due to AKI. Will increase dose today.  AKI: Improving. To maintain relatively fluid restrictive strategy, will stop IVF.  - Avoid nephrotoxins.   Severe protein calorie malnutrition:  - Supplement protein. Prealbumin low at 15.   Chronic HFpEF: Stable  Troponin elevation: No chest pain. ECG unchanged from prior. Doubt ACS.  - Defer further work up to outpatient setting.   DVT prophylaxis: SCDs Code Status: Full Family Communication: None at bedside Disposition Plan:  Status is: Inpatient  Remains inpatient appropriate because: Inpatient treatments required.  Consultants:  None  Procedures:  None  Antimicrobials: Ceftriaxone, azithromycin Remdesivir   Subjective: Diarrhea better. Reports moderate improvement in cough, only one small smear of blood in sputum over past 24 hours. Dyspnea is improved, though still desaturates into 70%'s quickly with weaning trial this morning.   Objective: Vitals:  01/25/21 0930 01/25/21 0940 01/25/21 0959 01/25/21 1205  BP:    (!)  157/85  Pulse:    61  Resp:    (!) 26  Temp:    99.4 F (37.4 C)  TempSrc:    Oral  SpO2: (!) 77% (!) 86% 91% (!) 78%  Weight:      Height:        Intake/Output Summary (Last 24 hours) at 01/25/2021 1638 Last data filed at 01/25/2021 1533 Gross per 24 hour  Intake 710 ml  Output 1825 ml  Net -1115 ml   Filed Weights   01/23/21 1900  Weight: 93 kg   Gen: 65 y.o. male in no distress Pulm: Nonlabored breathing supplemental oxygen, crackles in left greater than right lungs bilaterally. No wheezes.  CV: Regular rate and rhythm. No murmur, rub, or gallop. No JVD, no dependent edema. GI: Abdomen soft, protuberant with hyperactive bowel sounds. Not tender. Ext: Warm, no deformities Skin: No new rashes, lesions or ulcers on visualized skin. Neuro: Alert and oriented. No focal neurological deficits. Psych: Judgement and insight appear fair. Mood euthymic & affect congruent. Behavior is appropriate.    Data Reviewed: I have personally reviewed following labs and imaging studies  CBC: Recent Labs  Lab 01/23/21 2028 01/23/21 2037 01/24/21 0334 01/25/21 0351  WBC  --  10.7* 9.1 7.2  NEUTROABS  --  9.3* 8.1* 6.2  HGB 11.9* 12.0* 11.5* 11.1*  HCT 35.0* 35.2* 34.7* 32.3*  MCV  --  90.7 91.6 89.7  PLT  --  142* 125* 465*   Basic Metabolic Panel: Recent Labs  Lab 01/23/21 2028 01/23/21 2037 01/24/21 0059 01/24/21 0334 01/25/21 0351  NA 140 137  --  138 141  K 3.4* 3.4*  --  3.7 3.8  CL 101 101  --  105 109  CO2  --  25  --  23 22  GLUCOSE 209* 218*  --  298* 310*  BUN 35* 39*  --  39* 49*  CREATININE 1.80* 1.90*  --  1.65* 1.35*  CALCIUM  --  8.0*  --  7.6* 7.9*  MG  --   --  1.9 2.0  --   PHOS  --   --  4.7* 3.4  --    GFR: Estimated Creatinine Clearance: 62.7 mL/min (A) (by C-G formula based on SCr of 1.35 mg/dL (H)). Liver Function Tests: Recent Labs  Lab 01/23/21 2037 01/24/21 0334 01/25/21 0351  AST 27 21 17   ALT 20 17 14   ALKPHOS 45 40 36*  BILITOT 1.6*  1.0 1.1  PROT 7.3 6.5 6.4*  ALBUMIN 3.8 3.3* 3.0*   No results for input(s): LIPASE, AMYLASE in the last 168 hours. No results for input(s): AMMONIA in the last 168 hours. Coagulation Profile: No results for input(s): INR, PROTIME in the last 168 hours. Cardiac Enzymes: Recent Labs  Lab 01/24/21 0059  CKTOTAL 194   BNP (last 3 results) No results for input(s): PROBNP in the last 8760 hours. HbA1C: Recent Labs    01/24/21 0108  HGBA1C 5.9*   CBG: Recent Labs  Lab 01/24/21 1950 01/25/21 0007 01/25/21 0422 01/25/21 0745 01/25/21 1138  GLUCAP 255* 258* 285* 329* 264*   Lipid Profile: Recent Labs    01/23/21 2038  TRIG 73   Thyroid Function Tests: No results for input(s): TSH, T4TOTAL, FREET4, T3FREE, THYROIDAB in the last 72 hours. Anemia Panel: Recent Labs    01/23/21 2000 01/23/21 2038  FERRITIN 258 266  Urine analysis:    Component Value Date/Time   COLORURINE YELLOW 08/27/2020 1930   APPEARANCEUR CLEAR 08/27/2020 1930   LABSPEC 1.023 08/27/2020 1930   PHURINE 5.0 08/27/2020 1930   GLUCOSEU 50 (A) 08/27/2020 1930   HGBUR MODERATE (A) 08/27/2020 1930   BILIRUBINUR NEGATIVE 08/27/2020 1930   KETONESUR NEGATIVE 08/27/2020 1930   PROTEINUR >=300 (A) 08/27/2020 1930   UROBILINOGEN 0.2 12/05/2011 1655   NITRITE NEGATIVE 08/27/2020 1930   LEUKOCYTESUR NEGATIVE 08/27/2020 1930   Recent Results (from the past 240 hour(s))  Blood Culture (routine x 2)     Status: None (Preliminary result)   Collection Time: 01/23/21  8:39 PM   Specimen: Site Not Specified; Blood  Result Value Ref Range Status   Specimen Description   Final    SITE NOT SPECIFIED BLOOD Performed at [facility], [place] [address], [place], [place] [address]    Special Requests   Final    Blood Culture adequate volume BOTTLES DRAWN AEROBIC AND ANAEROBIC Performed at [facility] [address], [place], [place] [address]    Culture   Final    NO GROWTH 2  DAYS Performed at [facility] [address], [place], [place] [address]    Report Status PENDING  Incomplete  Blood Culture (routine x 2)     Status: None (Preliminary result)   Collection Time: 01/23/21  8:40 PM   Specimen: Site Not Specified; Blood  Result Value Ref Range Status   Specimen Description   Final    SITE NOT SPECIFIED BLOOD Performed at [facility], [place] [address], [place], [place] [address]    Special Requests   Final    BOTTLES DRAWN AEROBIC AND ANAEROBIC Blood Culture adequate volume Performed at [facility] [address] [place], [place] [address]    Culture   Final    NO GROWTH 2 DAYS Performed at [facility] [address] [place], [place] [address]    Report Status PENDING  Incomplete  Resp Panel by RT-PCR (Flu A&B, Covid) Nasopharyngeal Swab     Status: Abnormal   Collection Time: 01/23/21  8:45 PM   Specimen: Nasopharyngeal Swab; Nasopharyngeal(NP) swabs in vial transport medium  Result Value Ref Range Status   SARS Coronavirus 2 by RT PCR POSITIVE (A) NEGATIVE Final    Comment: RESULT CALLED TO, READ BACK BY AND VERIFIED WITH:  Willeen Cass RN 01/23/21 @ 2255 (NOTE) SARS-CoV-2 target nucleic acids are DETECTED.  The SARS-CoV-2 RNA is generally detectable in upper respiratory specimens during the acute phase of infection. Positive results are indicative of the presence of the identified virus, but do not rule out bacterial infection or co-infection with other pathogens not detected by the test. Clinical correlation with patient history and other diagnostic information is necessary to determine patient infection status. The expected result is Negative.  Fact Sheet for Patients: EntrepreneurPulse.com.au  Fact Sheet for Healthcare Providers: IncredibleEmployment.be  This test is not yet approved or cleared by the [place] FDA and  has been authorized for  detection and/or diagnosis of SARS-CoV-2 by FDA under an Emergency Use Authorization (EUA).  This EUA will remain in effect (meaning this test can b e used) for the duration of  the COVID-19 declaration under Section 564(b)(1) of the Act, 21 U.S.C. section 360bbb-3(b)(1), unless the authorization is terminated or revoked sooner.     Influenza A by PCR NEGATIVE NEGATIVE Final   Influenza  B by PCR NEGATIVE NEGATIVE Final    Comment: (NOTE) The Xpert Xpress SARS-CoV-2/FLU/RSV plus assay is intended as an aid in the diagnosis of influenza from Nasopharyngeal swab specimens and should not be used as a sole basis for treatment. Nasal washings and aspirates are unacceptable for Xpert Xpress SARS-CoV-2/FLU/RSV testing.  Fact Sheet for Patients: EntrepreneurPulse.com.au  Fact Sheet for Healthcare Providers: IncredibleEmployment.be  This test is not yet approved or cleared by the [place] FDA and has been authorized for detection and/or diagnosis of SARS-CoV-2 by FDA under an Emergency Use Authorization (EUA). This EUA will remain in effect (meaning this test can be used) for the duration of the COVID-19 declaration under Section 564(b)(1) of the Act, 21 U.S.C. section 360bbb-3(b)(1), unless the authorization is terminated or revoked.  Performed at [facility], [place] [address], [place], [place] [address]   Expectorated Sputum Assessment w Gram Stain, Rflx to Resp Cult     Status: None   Collection Time: 01/24/21  6:24 AM   Specimen: Expectorated Sputum  Result Value Ref Range Status   Specimen Description EXPECTORATED SPUTUM  Final   Special Requests NONE  Final   Sputum evaluation   Final    THIS SPECIMEN IS ACCEPTABLE FOR SPUTUM CULTURE Performed at [facility], [place] [address] [place], [place] [address]    Report Status 01/24/2021 FINAL  Final  Culture, Respiratory w Gram Stain     Status: None  (Preliminary result)   Collection Time: 01/24/21  6:24 AM  Result Value Ref Range Status   Specimen Description   Final    EXPECTORATED SPUTUM Performed at [facility] [address] [place], [place] [address]    Special Requests   Final    NONE Reflexed from [id number] Performed at [facility], [place] [address], [place], [place] [address]    Gram Stain   Final    ABUNDANT WBC PRESENT,BOTH PMN AND MONONUCLEAR FEW GRAM NEGATIVE RODS FEW GRAM POSITIVE COCCI RARE GRAM VARIABLE ROD    Culture   Final    CULTURE REINCUBATED FOR BETTER GROWTH Performed at [facility], [place] [address] [place], [place] [address]    Report Status PENDING  Incomplete      Radiology Studies: DG CHEST PORT 1 VIEW  Result Date: 01/24/2021 CLINICAL DATA:  Acute respiratory failure. EXAM: PORTABLE CHEST 1 VIEW COMPARISON:  Chest x-ray 01/23/2021. FINDINGS: Again seen is patchy airspace disease in the left mid and upper lung similar to the prior study. Right lung is clear. Cardiomediastinal silhouette is stable, the heart is mildly enlarged. There is no pleural effusion or pneumothorax. No acute fractures are seen. IMPRESSION: 1. Stable left mid and upper lung airspace disease worrisome for pneumonia. And Electronically Signed   By: Ronney Asters M.D.   On: 01/24/2021 19:12   DG Chest Port 1 View  Result Date: 01/23/2021 CLINICAL DATA:  Fever, COVID. EXAM: PORTABLE CHEST 1 VIEW COMPARISON:  Chest x-ray 10/17/2019. FINDINGS: There is dense patchy airspace consolidation throughout the left mid lung. Costophrenic angles are clear. No pneumothorax. Cardiomediastinal silhouette within normal limits. Osseous structures are within normal limits. IMPRESSION: Multifocal airspace disease in the left mid lung compatible with pneumonia. Followup PA and lateral chest X-ray is recommended in 3-4 weeks following trial of antibiotic therapy to ensure resolution and exclude underlying  malignancy. Electronically Signed   By: Ronney Asters M.D.   On: 01/23/2021 20:26    Scheduled Meds:  (  feeding supplement) PROSource Plus  30 mL Oral BID BM   amLODipine  10 mg Oral Daily   enoxaparin (LOVENOX) injection  40 mg Subcutaneous Q24H   hydrALAZINE  50 mg Oral Q8H   insulin aspart  0-20 Units Subcutaneous TID WC   insulin aspart  0-5 Units Subcutaneous QHS   insulin glargine-yfgn  24 Units Subcutaneous Daily   montelukast  10 mg Oral Daily   multivitamin with minerals  1 tablet Oral Daily   nebivolol  20 mg Oral QHS   pantoprazole  40 mg Oral Daily   [START ON 01/26/2021] pravastatin  20 mg Oral QHS   [START ON 01/26/2021] pregabalin  200 mg Oral Daily   pregabalin  300 mg Oral QHS   Ensure Max Protein  11 oz Oral Daily   sodium chloride flush  3 mL Intravenous Q12H   traZODone  100 mg Oral QHS   Continuous Infusions:  azithromycin 500 mg (01/24/21 2232)   cefTRIAXone (ROCEPHIN)  IV 2 g (01/24/21 2143)   remdesivir 100 mg in NS 100 mL 100 mg (01/25/21 0834)     LOS: 2 days   Time spent: 35 minutes.  Patrecia Pour, MD [facility] [email or web address] 01/25/2021, 4:38 PM

## 2021-01-25 NOTE — Progress Notes (Signed)
Inpatient Diabetes Program Recommendations  AACE/ADA: New Consensus Statement on Inpatient Glycemic Control (2015)  Target Ranges:  Prepandial:   less than 140 mg/dL      Peak postprandial:   less than 180 mg/dL (1-2 hours)      Critically ill patients:  140 - 180 mg/dL   Lab Results  Component Value Date   GLUCAP 92 01/25/2021   HGBA1C 5.9 (H) 01/24/2021    Review of Glycemic Control  Diabetes history: DM1 Outpatient Diabetes medications: Tresiba 24 units QAM, Humalog 0-20 TID Current orders for Inpatient glycemic control:  Semglee 24 units QD, Novolog 0-20 TID with meals and 0-5 HS  HgbA1C - 5.9% Steroids have been discontinued Type 1 DM and sensitive to insulin Hypoglycemia of 47 mg/dL likely d/t resistant s/s and discontinuation of steroids.  Inpatient Diabetes Program Recommendations:    Decrease Novolog to 0-9 units TID with meals and 0-5 HS Add Novolog 6 units TID if eating > 50% meal Consider discontinuing tradjenta 5 mg since Type 1  Will follow glucose trends.  Thank you. Lorenda Peck, RD, LDN, CDE Inpatient Diabetes Coordinator [phone]

## 2021-01-25 NOTE — Progress Notes (Signed)
Hypoglycemic Event  CBG: 47  Treatment: Given orange sherbet ice cream by NT  Symptoms: Shaky  Follow-up CBG: Time: 2156 CBG Result: 92  Possible Reasons for Event: Unknown  Comments/MD notified: Clarene Essex, NP    Darnelle Catalan

## 2021-01-26 ENCOUNTER — Other Ambulatory Visit: Payer: Self-pay

## 2021-01-26 ENCOUNTER — Inpatient Hospital Stay (HOSPITAL_COMMUNITY): Payer: Medicare Other

## 2021-01-26 DIAGNOSIS — D638 Anemia in other chronic diseases classified elsewhere: Secondary | ICD-10-CM | POA: Diagnosis not present

## 2021-01-26 DIAGNOSIS — I10 Essential (primary) hypertension: Secondary | ICD-10-CM | POA: Diagnosis not present

## 2021-01-26 DIAGNOSIS — U071 COVID-19: Secondary | ICD-10-CM | POA: Diagnosis not present

## 2021-01-26 DIAGNOSIS — N179 Acute kidney failure, unspecified: Secondary | ICD-10-CM | POA: Diagnosis not present

## 2021-01-26 LAB — COMPREHENSIVE METABOLIC PANEL
ALT: 19 U/L (ref 0–44)
AST: 22 U/L (ref 15–41)
Albumin: 2.9 g/dL — ABNORMAL LOW (ref 3.5–5.0)
Alkaline Phosphatase: 38 U/L (ref 38–126)
Anion gap: 12 (ref 5–15)
BUN: 47 mg/dL — ABNORMAL HIGH (ref 8–23)
CO2: 20 mmol/L — ABNORMAL LOW (ref 22–32)
Calcium: 7.9 mg/dL — ABNORMAL LOW (ref 8.9–10.3)
Chloride: 106 mmol/L (ref 98–111)
Creatinine, Ser: 1.15 mg/dL (ref 0.61–1.24)
GFR, Estimated: >60 mL/min (ref >60)
Glucose, Bld: 159 mg/dL — ABNORMAL HIGH (ref 70–99)
Potassium: 3.4 mmol/L — ABNORMAL LOW (ref 3.5–5.1)
Sodium: 138 mmol/L (ref 135–145)
Total Bilirubin: 1.5 mg/dL — ABNORMAL HIGH (ref 0.3–1.2)
Total Protein: 6 g/dL — ABNORMAL LOW (ref 6.5–8.1)

## 2021-01-26 LAB — CULTURE, RESPIRATORY W GRAM STAIN: Culture: NORMAL

## 2021-01-26 LAB — GLUCOSE, CAPILLARY
Glucose-Capillary: 161 mg/dL — ABNORMAL HIGH (ref 70–99)
Glucose-Capillary: 230 mg/dL — ABNORMAL HIGH (ref 70–99)
Glucose-Capillary: 190 mg/dL — ABNORMAL HIGH (ref 70–99)
Glucose-Capillary: 264 mg/dL — ABNORMAL HIGH (ref 70–99)

## 2021-01-26 LAB — CBC WITH DIFFERENTIAL/PLATELET
Abs Immature Granulocytes: 0.06 10*3/uL (ref 0.00–0.07)
Basophils Absolute: 0 10*3/uL (ref 0.0–0.1)
Basophils Relative: 0 %
Eosinophils Absolute: 0 10*3/uL (ref 0.0–0.5)
Eosinophils Relative: 0 %
HCT: 30.2 % — ABNORMAL LOW (ref 39.0–52.0)
Hemoglobin: 10.1 g/dL — ABNORMAL LOW (ref 13.0–17.0)
Immature Granulocytes: 1 %
Lymphocytes Relative: 12 %
Lymphs Abs: 1 10*3/uL (ref 0.7–4.0)
MCH: 30.5 pg (ref 26.0–34.0)
MCHC: 33.4 g/dL (ref 30.0–36.0)
MCV: 91.2 fL (ref 80.0–100.0)
Monocytes Absolute: 0.4 10*3/uL (ref 0.1–1.0)
Monocytes Relative: 5 %
Neutro Abs: 6.4 10*3/uL (ref 1.7–7.7)
Neutrophils Relative %: 82 %
Platelets: 155 10*3/uL (ref 150–400)
RBC: 3.31 MIL/uL — ABNORMAL LOW (ref 4.22–5.81)
RDW: 14.4 % (ref 11.5–15.5)
WBC: 7.8 10*3/uL (ref 4.0–10.5)
nRBC: 0 % (ref 0.0–0.2)

## 2021-01-26 LAB — C-REACTIVE PROTEIN: CRP: 11.1 mg/dL — ABNORMAL HIGH (ref <1.0)

## 2021-01-26 MED ORDER — INSULIN ASPART 100 UNIT/ML IJ SOLN
4.0000 [IU] | Freq: Three times a day (TID) | INTRAMUSCULAR | Status: DC
  Administered 2021-01-26 – 2021-01-29 (×11): 4 [IU] via SUBCUTANEOUS

## 2021-01-26 MED ORDER — INSULIN ASPART 100 UNIT/ML IJ SOLN
0.0000 [IU] | Freq: Every day | INTRAMUSCULAR | Status: DC
  Administered 2021-01-26: 3 [IU] via SUBCUTANEOUS
  Administered 2021-01-27: 5 [IU] via SUBCUTANEOUS
  Administered 2021-01-28: 4 [IU] via SUBCUTANEOUS

## 2021-01-26 MED ORDER — INSULIN ASPART 100 UNIT/ML IJ SOLN
0.0000 [IU] | Freq: Three times a day (TID) | INTRAMUSCULAR | Status: DC
  Administered 2021-01-26: 2 [IU] via SUBCUTANEOUS
  Administered 2021-01-26: 3 [IU] via SUBCUTANEOUS
  Administered 2021-01-26: 2 [IU] via SUBCUTANEOUS
  Administered 2021-01-27 (×2): 9 [IU] via SUBCUTANEOUS

## 2021-01-26 MED ORDER — HYDRALAZINE HCL 50 MG PO TABS
75.0000 mg | ORAL_TABLET | Freq: Three times a day (TID) | ORAL | Status: DC
  Administered 2021-01-26 – 2021-01-29 (×10): 75 mg via ORAL
  Filled 2021-01-26 (×10): qty 1

## 2021-01-26 MED ORDER — METHYLPREDNISOLONE SODIUM SUCC 125 MG IJ SOLR
120.0000 mg | INTRAMUSCULAR | Status: DC
  Administered 2021-01-26 – 2021-01-29 (×4): 120 mg via INTRAVENOUS
  Filled 2021-01-26 (×4): qty 2

## 2021-01-26 MED ORDER — ALBUTEROL SULFATE HFA 108 (90 BASE) MCG/ACT IN AERS
2.0000 | INHALATION_SPRAY | RESPIRATORY_TRACT | Status: DC | PRN
  Administered 2021-01-26: 2 via RESPIRATORY_TRACT

## 2021-01-26 MED ORDER — METHYLPREDNISOLONE SODIUM SUCC 125 MG IJ SOLR: 60.0000 mg | Freq: Two times a day (BID) | INTRAMUSCULAR | Status: DC

## 2021-01-26 NOTE — Progress Notes (Signed)
RT called to assess pt due to increased oxygen demands. RT arrived at bedside at ~ 0425 and upon arrival pt appeared to be resting in bed. Pt on HFNC (salter @ 8L) with NRB.  Pt's HR-71, SpO2-100%. No accessory muscle use noted. Pt able to answer questions without any dyspnea. Pt educated on body positioning, importance of cough/deep breathing and use of NRB intermittently. NP notified and new SpO2 orders obtained. RT will continue to monitor pt.

## 2021-01-26 NOTE — Progress Notes (Signed)
Pt continues to desat overnight. Was on 2L at shift change, increased to 3L at that time when O2 sats were mid 80s. Pt later then began satting in the mid 61s and O2 was increased to 4L. Charge RN was made aware along with respiratory therapy. RT recommended humidified oxygen and charge RN came to assess patient. At the time an irregular heart rhythm was noted on the monitor, a 12 lead EKG was obtained. NP on call made aware of the ekg and of the patients increased oxygen requirements. Increased respiratory rate was also noted and reported to NP responding. Yellow mews protocol started.   01/26/21 0129  Assess: MEWS Score  Temp 98.9 F (37.2 C)  BP 137/74  Pulse Rate 66  Resp (!) 29  Level of Consciousness Alert  SpO2 92 %  O2 Device Nasal Cannula  Patient Activity (if Appropriate) In bed  O2 Flow Rate (L/min) 5 L/min  Assess: MEWS Score  MEWS Temp 0  MEWS Systolic 0  MEWS Pulse 0  MEWS RR 2  MEWS LOC 0  MEWS Score 2  MEWS Score Color Yellow  Assess: if the MEWS score is Yellow or Red  Were vital signs taken at a resting state? Yes  Focused Assessment Change from prior assessment (see assessment flowsheet)  Does the patient meet 2 or more of the SIRS criteria? No  MEWS guidelines implemented *See Row Information* Yes  Treat  MEWS Interventions Consulted Respiratory Therapy;Other (Comment) (consulted charge RN to assess)  Pain Scale 0-10  Pain Score 0  Take Vital Signs  Increase Vital Sign Frequency  Yellow: Q 2hr X 2 then Q 4hr X 2, if remains yellow, continue Q 4hrs  Escalate  MEWS: Escalate Yellow: discuss with charge nurse/RN and consider discussing with provider and RRT  Notify: Charge Nurse/RN  Name of Charge Nurse/RN Notified [place] RN  Date Charge Nurse/RN Notified 01/26/21  Time Charge Nurse/RN Notified 0110  Notify: Provider  Provider Name/Title Clarene Essex  Date Provider Notified 01/26/21  Time Provider Notified [phone]  Notification Type Page  Notification Reason  Change in status  Provider response See new orders  Date of Provider Response 01/26/21  Time of Provider Response 0155  Document  Patient Outcome Stabilized after interventions  Assess: SIRS CRITERIA  SIRS Temperature  0  SIRS Pulse 0  SIRS Respirations  1  SIRS WBC 0  SIRS Score Sum  1

## 2021-01-26 NOTE — Plan of Care (Signed)

## 2021-01-26 NOTE — Progress Notes (Signed)
PROGRESS NOTE  Jesse Lane  WUJ:[id number] DOB: 31-Oct-1955 DOA: 01/23/2021 PCP: [name], MD  Outpatient Specialists: Mostly in [place] Brief Narrative: Jesse Lane is a 65 y.o. male with a history of IDT2DM, gastroparesis, HTN, melanoma metastases to lymph nodes and intestines with unclear primary source s/p [place] who presented to the ED 10/17 with profuse diarrhea worsening over several days as well as worsening shortness of breath for 1 day, all beginning several days after covid exposure. SARS-CoV-2 Ag was confirmed to be positive. He was hypoxic on arrival with left-sided infiltrate. CRP and PCT grossly elevated. Antibiotics and remdesivir started on admission. Steroids also given, though temporarily held due to history of severe steroid-induced hyperglycemia and suspicion for severe bacterial infection. Labs have improved, though hypoxia worsened on 10/20 for which solumedrol is started.   Assessment & Plan: Active Problems:   Malignant melanoma of axilla (HCC)   Essential hypertension   Anemia of chronic disease   Protein-calorie malnutrition, severe   AKI (acute kidney injury) ([place])   Gastroparesis due to secondary diabetes ([place])   Pneumonia due to COVID-19 virus   Hypokalemia   Acute respiratory failure due to COVID-19 [facility])  Acute hypoxic respiratory failure: Due to CAP of LLL definitely, and now increasingly covid-19 pneumonia. CXR reordered this AM for progressive hypoxia shows, by my read, worsening patchy airspace opacities in right mid/upper lung zones with improvement in more dense infiltrate on left. Pt also more "happily" hypoxic, overall consistent with advancing covid pneumonia (had +antigen initially).  - D/w RN, will keep supplemental oxygen to maintain normal work of breathing and SpO2 88% or better.  - Restarting steroids as below.  - Very low threshold to give lasix if respiratory status worsens today. - Urged continued use of IS, FV  LLL  pneumonia: PCT is 23 suggestive that this is significant driver of CRP elevation with mild d-dimer elevation.  - Continue ceftriaxone, azithromycin. PCT has responded, no leukocytosis, sputum culture without staph aureus or pseudomonas.  - Monitor blood (NG3D)   - Urinary antigens pending.   Covid-19 pneumonia:  LFTs ok, mild thrombocytopenia noted likely due to viral infection.  - Restart solumedrol 60mg  IV q12h with worsening hypoxia and spreading infiltrates. Note presence of bacterial infection would be relative contraindication to immunomodulator Tx, though this is not currently required regardless. - Discussed proning with patient who will try, but has not had success on his belly in the past.  - Continue remdesivir x5 days planned.  Covid gastroenteritis: Symptoms overall improved.   Hemoptysis: In setting of pneumonia and thrombocytopenia (which has resolved) - Continue VTE ppx.  IDT2DM: Chronically well-controlled HbA1c (5.9%). - Continue basal 24u, augmented to resistant SSI at the same time the patient did not eat dinner and had resultant hypoglycemia. We've adjusted this, though with steroids and his robust po intake, I anticipate having to re-augment these in the near future.   - Added linagliptin while admitted.  Gastroparesis:  - Pt no longer takes reglan  Abdominal bloating: Benign exam, having BMs. Resolved.   - Consider XR or further work up if worsening.   AOCD: Stable  Hypokalemia: Improved with supplementation.  HTN:  - Continue norvasc, nebivolol - Started hydralazine for high BP while holding thiazide and ACEi due to AKI. Will increase dose again today.  AKI: Improved. Stopped IVF yesterday, may need lasix today if respiratory status worsens. - Avoid nephrotoxins.   Severe protein calorie malnutrition:  - Supplement protein. Prealbumin low at  15.   Chronic HFpEF: Stable  Troponin elevation: No chest pain. ECG unchanged from prior. Doubt ACS.  - Defer  further work up to outpatient setting.   DVT prophylaxis: SCDs, Lovenox Code Status: Full Family Communication: None at bedside Disposition Plan:  Status is: Inpatient  Remains inpatient appropriate because: Inpatient treatments required.  Consultants:  None  Procedures:  None  Antimicrobials: Ceftriaxone, azithromycin Remdesivir   Subjective: Diarrhea better, abdominal distention resolved. Feels he's overall "better," which he's said every day since arrival, but he did have progressive hypoxia that for a time required HFNC + NRB last night though he reports only feeling slightly more difficulty breathing. No chest pain, no leg swelling. Didn't eat dinner because he didn't feel like it, requested fruit which he states was rotten so didn't eat that, then had low blood sugar. Feels fine glycemically this morning, ate good breakfast.   Objective: Vitals:   01/26/21 0524 01/26/21 0900 01/26/21 0926 01/26/21 0930  BP: (!) 162/79  (!) 141/69   Pulse: 70  65   Resp: (!) 22  20   Temp: 99.1 F (37.3 C)  98.6 F (37 C)   TempSrc: Oral  Oral   SpO2: 91% 93% 92% 96%  Weight:      Height:        Intake/Output Summary (Last 24 hours) at 01/26/2021 0940 Last data filed at 01/26/2021 0526 Gross per 24 hour  Intake 350 ml  Output 800 ml  Net -450 ml   Filed Weights   01/23/21 1900  Weight: 93 kg   Gen: 65 y.o. male in no distress Pulm: Nonlabored tachypnea. No wheezing. Since last exam, has developed worse R mid/upper crackles and stable left crackles that are not dependent. CV: Regular rate and rhythm. No murmur, rub, or gallop. No JVD, no dependent edema. GI: Abdomen soft, non-tender, non-distended, with normoactive bowel sounds.  Ext: Warm, no deformities Skin: No rashes, lesions or ulcers on visualized skin. Neuro: Alert and oriented. No focal neurological deficits. Psych: Judgement and insight appear fair. Mood euthymic & affect congruent. Behavior is appropriate.     Data Reviewed: I have personally reviewed following labs and imaging studies  CBC: Recent Labs  Lab 01/23/21 2028 01/23/21 2037 01/24/21 0334 01/25/21 0351 01/26/21 0351  WBC  --  10.7* 9.1 7.2 7.8  NEUTROABS  --  9.3* 8.1* 6.2 6.4  HGB 11.9* 12.0* 11.5* 11.1* 10.1*  HCT 35.0* 35.2* 34.7* 32.3* 30.2*  MCV  --  90.7 91.6 89.7 91.2  PLT  --  142* 125* 139* 144   Basic Metabolic Panel: Recent Labs  Lab 01/23/21 2028 01/23/21 2037 01/24/21 0059 01/24/21 0334 01/25/21 0351 01/26/21 0351  NA 140 137  --  138 141 138  K 3.4* 3.4*  --  3.7 3.8 3.4*  CL 101 101  --  105 109 106  CO2  --  25  --  23 22 20*  GLUCOSE 209* 218*  --  298* 310* 159*  BUN 35* 39*  --  39* 49* 47*  CREATININE 1.80* 1.90*  --  1.65* 1.35* 1.15  CALCIUM  --  8.0*  --  7.6* 7.9* 7.9*  MG  --   --  1.9 2.0  --   --   PHOS  --   --  4.7* 3.4  --   --    GFR: Estimated Creatinine Clearance: 73.6 mL/min (by C-G formula based on SCr of 1.15 mg/dL). Liver Function Tests: Recent Labs  Lab 01/23/21 2037 01/24/21 0334 01/25/21 0351 01/26/21 0351  AST 27 21 17 22   ALT 20 17 14 19   ALKPHOS 45 40 36* 38  BILITOT 1.6* 1.0 1.1 1.5*  PROT 7.3 6.5 6.4* 6.0*  ALBUMIN 3.8 3.3* 3.0* 2.9*   No results for input(s): LIPASE, AMYLASE in the last 168 hours. No results for input(s): AMMONIA in the last 168 hours. Coagulation Profile: No results for input(s): INR, PROTIME in the last 168 hours. Cardiac Enzymes: Recent Labs  Lab 01/24/21 0059  CKTOTAL 194   BNP (last 3 results) No results for input(s): PROBNP in the last 8760 hours. HbA1C: Recent Labs    01/24/21 0108  HGBA1C 5.9*   CBG: Recent Labs  Lab 01/25/21 1138 01/25/21 1644 01/25/21 2117 01/25/21 2156 01/26/21 0749  GLUCAP 264* 123* 47* 92 161*   Lipid Profile: Recent Labs    01/23/21 2038  TRIG 73   Thyroid Function Tests: No results for input(s): TSH, T4TOTAL, FREET4, T3FREE, THYROIDAB in the last 72 hours. Anemia  Panel: Recent Labs    01/23/21 2000 01/23/21 2038  FERRITIN 258 266   Urine analysis:    Component Value Date/Time   COLORURINE YELLOW 08/27/2020 1930   APPEARANCEUR CLEAR 08/27/2020 1930   LABSPEC 1.023 08/27/2020 1930   PHURINE 5.0 08/27/2020 1930   GLUCOSEU 50 (A) 08/27/2020 1930   HGBUR MODERATE (A) 08/27/2020 1930   BILIRUBINUR NEGATIVE 08/27/2020 1930   KETONESUR NEGATIVE 08/27/2020 1930   PROTEINUR >=300 (A) 08/27/2020 1930   UROBILINOGEN 0.2 12/05/2011 1655   NITRITE NEGATIVE 08/27/2020 1930   LEUKOCYTESUR NEGATIVE 08/27/2020 1930   Recent Results (from the past 240 hour(s))  Blood Culture (routine x 2)     Status: None (Preliminary result)   Collection Time: 01/23/21  8:39 PM   Specimen: Site Not Specified; Blood  Result Value Ref Range Status   Specimen Description   Final    SITE NOT SPECIFIED BLOOD Performed at [facility], [place] [address] [place], [place] [address]    Special Requests   Final    Blood Culture adequate volume BOTTLES DRAWN AEROBIC AND ANAEROBIC Performed at [place] [address] [place], [place] [address]    Culture   Final    NO GROWTH 2 DAYS Performed at [place] [address], [place], [place] [address]    Report Status PENDING  Incomplete  Blood Culture (routine x 2)     Status: None (Preliminary result)   Collection Time: 01/23/21  8:40 PM   Specimen: Site Not Specified; Blood  Result Value Ref Range Status   Specimen Description   Final    SITE NOT SPECIFIED BLOOD Performed at [facility], [place] [address] [place], [place] [address]    Special Requests   Final    BOTTLES DRAWN AEROBIC AND ANAEROBIC Blood Culture adequate volume Performed at [place] [address], [place], [place] [address]    Culture   Final    NO GROWTH 2 DAYS Performed at [place] [address], [place],  [address]    Report Status PENDING  Incomplete  Resp  Panel by RT-PCR (Flu A&B, Covid) Nasopharyngeal Swab     Status: Abnormal   Collection Time: 01/23/21  8:45 PM   Specimen: Nasopharyngeal Swab; Nasopharyngeal(NP) swabs in vial transport medium  Result Value Ref Range Status   SARS Coronavirus 2 by RT PCR POSITIVE (A) NEGATIVE Final  Comment: RESULT CALLED TO, READ BACK BY AND VERIFIED WITH:  Willeen Cass RN 01/23/21 @ 2255 (NOTE) SARS-CoV-2 target nucleic acids are DETECTED.  The SARS-CoV-2 RNA is generally detectable in upper respiratory specimens during the acute phase of infection. Positive results are indicative of the presence of the identified virus, but do not rule out bacterial infection or co-infection with other pathogens not detected by the test. Clinical correlation with patient history and other diagnostic information is necessary to determine patient infection status. The expected result is Negative.  Fact Sheet for Patients: EntrepreneurPulse.com.au  Fact Sheet for Healthcare Providers: IncredibleEmployment.be  This test is not yet approved or cleared by the [place] FDA and  has been authorized for detection and/or diagnosis of SARS-CoV-2 by FDA under an Emergency Use Authorization (EUA).  This EUA will remain in effect (meaning this test can b e used) for the duration of  the COVID-19 declaration under Section 564(b)(1) of the Act, 21 U.S.C. section 360bbb-3(b)(1), unless the authorization is terminated or revoked sooner.     Influenza A by PCR NEGATIVE NEGATIVE Final   Influenza B by PCR NEGATIVE NEGATIVE Final    Comment: (NOTE) The Xpert Xpress SARS-CoV-2/FLU/RSV plus assay is intended as an aid in the diagnosis of influenza from Nasopharyngeal swab specimens and should not be used as a sole basis for treatment. Nasal washings and aspirates are unacceptable for Xpert Xpress SARS-CoV-2/FLU/RSV testing.  Fact Sheet for  Patients: EntrepreneurPulse.com.au  Fact Sheet for Healthcare Providers: IncredibleEmployment.be  This test is not yet approved or cleared by the [place] FDA and has been authorized for detection and/or diagnosis of SARS-CoV-2 by FDA under an Emergency Use Authorization (EUA). This EUA will remain in effect (meaning this test can be used) for the duration of the COVID-19 declaration under Section 564(b)(1) of the Act, 21 U.S.C. section 360bbb-3(b)(1), unless the authorization is terminated or revoked.  Performed at [facility], [place] [address], [place], [place] [address]   Expectorated Sputum Assessment w Gram Stain, Rflx to Resp Cult     Status: None   Collection Time: 01/24/21  6:24 AM   Specimen: Expectorated Sputum  Result Value Ref Range Status   Specimen Description EXPECTORATED SPUTUM  Final   Special Requests NONE  Final   Sputum evaluation   Final    THIS SPECIMEN IS ACCEPTABLE FOR SPUTUM CULTURE Performed at [facility], [place] [address] [place], [place] [address]    Report Status 01/24/2021 FINAL  Final  Culture, Respiratory w Gram Stain     Status: None (Preliminary result)   Collection Time: 01/24/21  6:24 AM  Result Value Ref Range Status   Specimen Description   Final    EXPECTORATED SPUTUM Performed at [place] [address] [place], [place] [address]    Special Requests   Final    NONE Reflexed from [id number] Performed at [facility], [place] [address] [place], [place] [address]    Gram Stain   Final    ABUNDANT WBC PRESENT,BOTH PMN AND MONONUCLEAR FEW GRAM NEGATIVE RODS FEW GRAM POSITIVE COCCI RARE GRAM VARIABLE ROD    Culture   Final    CULTURE REINCUBATED FOR BETTER GROWTH Performed at [facility], [place] [address] [place], [place] [address]    Report Status PENDING  Incomplete      Radiology Studies: DG CHEST PORT 1  VIEW  Result Date: 01/26/2021 CLINICAL DATA:  Covid +, increased sob,  difficulty breathing, decreased O2 sats this a.m, EXAM: PORTABLE CHEST - 1 VIEW COMPARISON:  01/24/2021 FINDINGS: The airspace disease in the left upper lobe has moderately improved. There is developing airspace opacity in the right mid lung. Heart size mildly enlarged for technique. No effusion.  No pneumothorax. Visualized bones unremarkable. IMPRESSION: Improving left upper lobe and worsening right mid lung airspace opacities. Electronically Signed   By: Lucrezia Europe M.D.   On: 01/26/2021 07:32   DG CHEST PORT 1 VIEW  Result Date: 01/24/2021 CLINICAL DATA:  Acute respiratory failure. EXAM: PORTABLE CHEST 1 VIEW COMPARISON:  Chest x-ray 01/23/2021. FINDINGS: Again seen is patchy airspace disease in the left mid and upper lung similar to the prior study. Right lung is clear. Cardiomediastinal silhouette is stable, the heart is mildly enlarged. There is no pleural effusion or pneumothorax. No acute fractures are seen. IMPRESSION: 1. Stable left mid and upper lung airspace disease worrisome for pneumonia. And Electronically Signed   By: Ronney Asters M.D.   On: 01/24/2021 19:12    Scheduled Meds:  (feeding supplement) PROSource Plus  30 mL Oral BID BM   amLODipine  10 mg Oral Daily   enoxaparin (LOVENOX) injection  40 mg Subcutaneous Q24H   hydrALAZINE  50 mg Oral Q8H   insulin aspart  0-5 Units Subcutaneous QHS   insulin aspart  0-9 Units Subcutaneous TID WC   insulin aspart  4 Units Subcutaneous TID WC   insulin glargine-yfgn  24 Units Subcutaneous Daily   linagliptin  5 mg Oral Daily   methylPREDNISolone (SOLU-MEDROL) injection  60 mg Intravenous Q12H   montelukast  10 mg Oral Daily   multivitamin with minerals  1 tablet Oral Daily   nebivolol  20 mg Oral QHS   pantoprazole  40 mg Oral Daily   pravastatin  20 mg Oral QHS   pregabalin  200 mg Oral Daily   pregabalin  300 mg Oral QHS   Ensure Max Protein  11 oz Oral Daily    sodium chloride flush  3 mL Intravenous Q12H   traZODone  100 mg Oral QHS   Continuous Infusions:  azithromycin 500 mg (01/25/21 2137)   cefTRIAXone (ROCEPHIN)  IV 2 g (01/25/21 2259)   remdesivir 100 mg in NS 100 mL 100 mg (01/26/21 0838)     LOS: 3 days   Time spent: 35 minutes.  Patrecia Pour, MD [facility] [email or web address] 01/26/2021, 9:40 AM

## 2021-01-26 NOTE — Care Management Important Message (Signed)
Important Message  Patient Details IM Letter given to the Patient. Name: Jesse Lane MRN: [id number] Date of Birth: [birth year]   Medicare Important Message Given:  Yes     Kerin Salen 01/26/2021, 11:43 AM

## 2021-01-27 DIAGNOSIS — U071 COVID-19: Secondary | ICD-10-CM | POA: Diagnosis not present

## 2021-01-27 DIAGNOSIS — J9600 Acute respiratory failure, unspecified whether with hypoxia or hypercapnia: Secondary | ICD-10-CM | POA: Diagnosis not present

## 2021-01-27 DIAGNOSIS — N179 Acute kidney failure, unspecified: Secondary | ICD-10-CM | POA: Diagnosis not present

## 2021-01-27 LAB — CBC WITH DIFFERENTIAL/PLATELET
Abs Immature Granulocytes: 0.07 10*3/uL (ref 0.00–0.07)
Basophils Absolute: 0 10*3/uL (ref 0.0–0.1)
Basophils Relative: 0 %
Eosinophils Absolute: 0 10*3/uL (ref 0.0–0.5)
Eosinophils Relative: 0 %
HCT: 29 % — ABNORMAL LOW (ref 39.0–52.0)
Hemoglobin: 9.6 g/dL — ABNORMAL LOW (ref 13.0–17.0)
Immature Granulocytes: 2 %
Lymphocytes Relative: 9 %
Lymphs Abs: 0.3 10*3/uL — ABNORMAL LOW (ref 0.7–4.0)
MCH: 29.8 pg (ref 26.0–34.0)
MCHC: 33.1 g/dL (ref 30.0–36.0)
MCV: 90.1 fL (ref 80.0–100.0)
Monocytes Absolute: 0.1 10*3/uL (ref 0.1–1.0)
Monocytes Relative: 2 %
Neutro Abs: 2.9 10*3/uL (ref 1.7–7.7)
Neutrophils Relative %: 87 %
Platelets: 145 10*3/uL — ABNORMAL LOW (ref 150–400)
RBC: 3.22 MIL/uL — ABNORMAL LOW (ref 4.22–5.81)
RDW: 14.1 % (ref 11.5–15.5)
WBC: 3.3 10*3/uL — ABNORMAL LOW (ref 4.0–10.5)
nRBC: 0 % (ref 0.0–0.2)

## 2021-01-27 LAB — COMPREHENSIVE METABOLIC PANEL
ALT: 23 U/L (ref 0–44)
AST: 21 U/L (ref 15–41)
Albumin: 2.8 g/dL — ABNORMAL LOW (ref 3.5–5.0)
Alkaline Phosphatase: 35 U/L — ABNORMAL LOW (ref 38–126)
Anion gap: 9 (ref 5–15)
BUN: 54 mg/dL — ABNORMAL HIGH (ref 8–23)
CO2: 20 mmol/L — ABNORMAL LOW (ref 22–32)
Calcium: 7.6 mg/dL — ABNORMAL LOW (ref 8.9–10.3)
Chloride: 101 mmol/L (ref 98–111)
Creatinine, Ser: 1.61 mg/dL — ABNORMAL HIGH (ref 0.61–1.24)
GFR, Estimated: 47 mL/min — ABNORMAL LOW (ref >60)
Glucose, Bld: 482 mg/dL — ABNORMAL HIGH (ref 70–99)
Potassium: 3.9 mmol/L (ref 3.5–5.1)
Sodium: 130 mmol/L — ABNORMAL LOW (ref 135–145)
Total Bilirubin: 1.1 mg/dL (ref 0.3–1.2)
Total Protein: 5.9 g/dL — ABNORMAL LOW (ref 6.5–8.1)

## 2021-01-27 LAB — GLUCOSE, CAPILLARY
Glucose-Capillary: 449 mg/dL — ABNORMAL HIGH (ref 70–99)
Glucose-Capillary: 538 mg/dL (ref 70–99)
Glucose-Capillary: 497 mg/dL — ABNORMAL HIGH (ref 70–99)
Glucose-Capillary: 502 mg/dL (ref 70–99)

## 2021-01-27 LAB — C-REACTIVE PROTEIN: CRP: 12.1 mg/dL — ABNORMAL HIGH (ref <1.0)

## 2021-01-27 MED ORDER — INSULIN GLARGINE-YFGN 100 UNIT/ML ~~LOC~~ SOLN
20.0000 [IU] | Freq: Two times a day (BID) | SUBCUTANEOUS | Status: DC
  Administered 2021-01-27: 20 [IU] via SUBCUTANEOUS
  Filled 2021-01-27 (×2): qty 0.2

## 2021-01-27 MED ORDER — INSULIN ASPART 100 UNIT/ML IJ SOLN
0.0000 [IU] | Freq: Three times a day (TID) | INTRAMUSCULAR | Status: DC
  Administered 2021-01-27: 20 [IU] via SUBCUTANEOUS
  Administered 2021-01-28: 15 [IU] via SUBCUTANEOUS
  Administered 2021-01-28: 20 [IU] via SUBCUTANEOUS
  Administered 2021-01-28: 15 [IU] via SUBCUTANEOUS
  Administered 2021-01-29 (×2): 20 [IU] via SUBCUTANEOUS

## 2021-01-27 MED ORDER — INSULIN ASPART 100 UNIT/ML IJ SOLN
10.0000 [IU] | Freq: Once | INTRAMUSCULAR | Status: AC
  Administered 2021-01-27: 10 [IU] via SUBCUTANEOUS

## 2021-01-27 NOTE — Progress Notes (Signed)
Pts BG was 502. MD notified.

## 2021-01-27 NOTE — Progress Notes (Signed)
PROGRESS NOTE  Jesse Lane  QQV:[id number] DOB: [birth year] DOA: 01/23/2021 PCP: [name], MD  Outpatient Specialists: Mostly in [place] Brief Narrative: Jesse Lane is a 65 y.o. male with a history of IDT2DM, gastroparesis, HTN, melanoma metastases to lymph nodes and intestines with unclear primary source s/p [place] who presented to the ED 10/17 with profuse diarrhea worsening over several days as well as worsening shortness of breath for 1 day, all beginning several days after covid exposure. SARS-CoV-2 Ag was confirmed to be positive. He was hypoxic on arrival with left-sided infiltrate. CRP and PCT grossly elevated. Antibiotics and remdesivir started on admission. Steroids also given, though temporarily held due to history of severe steroid-induced hyperglycemia and suspicion for severe bacterial infection. Labs have improved, though hypoxia worsened on 10/20 for which solumedrol is started.   Assessment & Plan:  Acute hypoxic respiratory failure, multifactorial,  Concurrent CAP and COVID pna both POA, improving:  - Notable bacterial CAP noted on intake LLL and elevated procalcitonin - Worsening patchy infiltrate on imaging likely secondary to COVID PNA -Continue oxygen, wean aggressively, discussed early ambulation, proning and incentive spirometry at length today at bedside -Appears euvolemic today hold diuresis - Continue ceftriaxone, azithromycin for community-acquired pneumonia coverage for 5 days -Continue Remdesivir and steroids -holding immunomodulators in the setting of bacterial pneumonia  Covid gastroenteritis: Symptoms overall improved.   Hemoptysis: In setting of both bacterial and viral pneumonia and thrombocytopenia (which has resolved) - Continue VTE ppx.  Insulin-dependent diabetes mellitus type 2 -Currently uncontrolled in the setting of steroids:  - Chronically well-controlled HbA1c (5.9%). - Continue linagliptin, increase to resistant sliding  scale insulin and increase glargine to 20 units twice daily  Gastroparesis:  -Chronic, currently well controlled, pt no longer takes reglan  AOCD:  Stable  Hypokalemia:  Improved with supplementation.  HTN:  - Continue norvasc, nebivolol - Started hydralazine for high BP while holding thiazide and ACEi due to AKI. Will increase dose again today.  AKI, POA, resolved:  -Continue to hold IV fluids, no indication for Lasix given euvolemia today   Severe protein calorie malnutrition:  - Supplement protein. Prealbumin low at 15.   Chronic HFpEF: Stable  Incidentally noted troponin elevation, ACS ruled out: - Likely secondary to supply demand mismatch/hypoxia - Patient remains without chest pain, EKG without ST changes, no further indication for inpatient work-up   DVT prophylaxis: SCDs, Lovenox Code Status: Full Family Communication: None at bedside Disposition Plan:  Status is: Inpatient  Remains inpatient appropriate because: Inpatient treatments required.  Consultants:  None  Procedures:  None  Antimicrobials: Ceftriaxone, azithromycin Remdesivir   Subjective: no acute issues or events overnight, feels remarkably improved over the past 24 hours with oxygen supplementation and steroids.  Denies nausea vomiting diarrhea constipation headache fevers chills or chest pain over the past 24 hours; no further episodes of hypoglycemia given improved p.o. intake.  Objective: Vitals:   01/26/21 2053 01/26/21 2333 01/26/21 2335 01/27/21 0522  BP: (!) 168/84   (!) 161/86  Pulse: 65   74  Resp: 20   20  Temp: 97.9 F (36.6 C)   98.5 F (36.9 C)  TempSrc: Oral   Oral  SpO2: 93% (!) 86% 92% 94%  Weight:      Height:        Intake/Output Summary (Last 24 hours) at 01/27/2021 0746 Last data filed at 01/27/2021 0524 Gross per 24 hour  Intake 553 ml  Output 425 ml  Net 128 ml  Filed Weights   01/23/21 1900  Weight: 93 kg   General:  Pleasantly resting in bed, No  acute distress. HEENT:  Normocephalic atraumatic.  Sclerae nonicteric, noninjected.  Extraocular movements intact bilaterally. Neck:  Without mass or deformity.  Trachea is midline. Lungs: Diminished breath sounds bilaterally without overt wheezes rhonchi or rales. Heart:  Regular rate and rhythm.  Without murmurs, rubs, or gallops. Abdomen:  Soft, nontender, nondistended.  Without guarding or rebound. Extremities: Without cyanosis, clubbing, edema, or obvious deformity. Vascular:  Dorsalis pedis and posterior tibial pulses palpable bilaterally. Skin:  Warm and dry, no erythema, no ulcerations.  Data Reviewed: I have personally reviewed following labs and imaging studies  CBC: Recent Labs  Lab 01/23/21 2037 01/24/21 0334 01/25/21 0351 01/26/21 0351 01/27/21 0340  WBC 10.7* 9.1 7.2 7.8 3.3*  NEUTROABS 9.3* 8.1* 6.2 6.4 2.9  HGB 12.0* 11.5* 11.1* 10.1* 9.6*  HCT 35.2* 34.7* 32.3* 30.2* 29.0*  MCV 90.7 91.6 89.7 91.2 90.1  PLT 142* 125* 139* 155 145*    Basic Metabolic Panel: Recent Labs  Lab 01/23/21 2037 01/24/21 0059 01/24/21 0334 01/25/21 0351 01/26/21 0351 01/27/21 0340  NA 137  --  138 141 138 130*  K 3.4*  --  3.7 3.8 3.4* 3.9  CL 101  --  105 109 106 101  CO2 25  --  23 22 20* 20*  GLUCOSE 218*  --  298* 310* 159* 482*  BUN 39*  --  39* 49* 47* 54*  CREATININE 1.90*  --  1.65* 1.35* 1.15 1.61*  CALCIUM 8.0*  --  7.6* 7.9* 7.9* 7.6*  MG  --  1.9 2.0  --   --   --   PHOS  --  4.7* 3.4  --   --   --     GFR: Estimated Creatinine Clearance: 52.6 mL/min (A) (by C-G formula based on SCr of 1.61 mg/dL (H)). Liver Function Tests: Recent Labs  Lab 01/23/21 2037 01/24/21 0334 01/25/21 0351 01/26/21 0351 01/27/21 0340  AST 27 21 17 22 21   ALT 20 17 14 19 23   ALKPHOS 45 40 36* 38 35*  BILITOT 1.6* 1.0 1.1 1.5* 1.1  PROT 7.3 6.5 6.4* 6.0* 5.9*  ALBUMIN 3.8 3.3* 3.0* 2.9* 2.8*    No results for input(s): LIPASE, AMYLASE in the last 168 hours. No results for  input(s): AMMONIA in the last 168 hours. Coagulation Profile: No results for input(s): INR, PROTIME in the last 168 hours. Cardiac Enzymes: Recent Labs  Lab 01/24/21 0059  CKTOTAL 194    BNP (last 3 results) No results for input(s): PROBNP in the last 8760 hours. HbA1C: No results for input(s): HGBA1C in the last 72 hours.  CBG: Recent Labs  Lab 01/25/21 2156 01/26/21 0749 01/26/21 1214 01/26/21 1708 01/26/21 2032  GLUCAP 92 161* 230* 190* 264*    Lipid Profile: No results for input(s): CHOL, HDL, LDLCALC, TRIG, CHOLHDL, LDLDIRECT in the last 72 hours.  Thyroid Function Tests: No results for input(s): TSH, T4TOTAL, FREET4, T3FREE, THYROIDAB in the last 72 hours. Anemia Panel: No results for input(s): VITAMINB12, FOLATE, FERRITIN, TIBC, IRON, RETICCTPCT in the last 72 hours.  Urine analysis:    Component Value Date/Time   COLORURINE YELLOW 08/27/2020 1930   APPEARANCEUR CLEAR 08/27/2020 1930   LABSPEC 1.023 08/27/2020 1930   PHURINE 5.0 08/27/2020 1930   GLUCOSEU 50 (A) 08/27/2020 1930   HGBUR MODERATE (A) 08/27/2020 1930   BILIRUBINUR NEGATIVE 08/27/2020 1930   KETONESUR NEGATIVE  08/27/2020 1930   PROTEINUR >=300 (A) 08/27/2020 1930   UROBILINOGEN 0.2 12/05/2011 1655   NITRITE NEGATIVE 08/27/2020 1930   LEUKOCYTESUR NEGATIVE 08/27/2020 1930   Recent Results (from the past 240 hour(s))  Blood Culture (routine x 2)     Status: None (Preliminary result)   Collection Time: 01/23/21  8:39 PM   Specimen: Site Not Specified; Blood  Result Value Ref Range Status   Specimen Description   Final    SITE NOT SPECIFIED BLOOD Performed at [facility], [place] [address] [place], [place] [address]    Special Requests   Final    Blood Culture adequate volume BOTTLES DRAWN AEROBIC AND ANAEROBIC Performed at [place] [address] [place], [place] [address]    Culture   Final    NO GROWTH 3 DAYS Performed at [facility], [place]  [address], [place], [place] [address]    Report Status PENDING  Incomplete  Blood Culture (routine x 2)     Status: None (Preliminary result)   Collection Time: 01/23/21  8:40 PM   Specimen: Site Not Specified; Blood  Result Value Ref Range Status   Specimen Description   Final    SITE NOT SPECIFIED BLOOD Performed at [facility], [place] [address] [place], [place] [address]    Special Requests   Final    BOTTLES DRAWN AEROBIC AND ANAEROBIC Blood Culture adequate volume Performed at [place] [address], [place], [place] [address]    Culture   Final    NO GROWTH 3 DAYS Performed at [facility], [place] [address] [place], [place] [address]    Report Status PENDING  Incomplete  Resp Panel by RT-PCR (Flu A&B, Covid) Nasopharyngeal Swab     Status: Abnormal   Collection Time: 01/23/21  8:45 PM   Specimen: Nasopharyngeal Swab; Nasopharyngeal(NP) swabs in vial transport medium  Result Value Ref Range Status   SARS Coronavirus 2 by RT PCR POSITIVE (A) NEGATIVE Final    Comment: RESULT CALLED TO, READ BACK BY AND VERIFIED WITH:  Willeen Cass RN 01/23/21 @ 2255 (NOTE) SARS-CoV-2 target nucleic acids are DETECTED.  The SARS-CoV-2 RNA is generally detectable in upper respiratory specimens during the acute phase of infection. Positive results are indicative of the presence of the identified virus, but do not rule out bacterial infection or co-infection with other pathogens not detected by the test. Clinical correlation with patient history and other diagnostic information is necessary to determine patient infection status. The expected result is Negative.  Fact Sheet for Patients: EntrepreneurPulse.com.au  Fact Sheet for Healthcare Providers: IncredibleEmployment.be  This test is not yet approved or cleared by the [place] FDA and  has been authorized for detection and/or diagnosis of SARS-CoV-2 by FDA under an  Emergency Use Authorization (EUA).  This EUA will remain in effect (meaning this test can b e used) for the duration of  the COVID-19 declaration under Section 564(b)(1) of the Act, 21 U.S.C. section 360bbb-3(b)(1), unless the authorization is terminated or revoked sooner.     Influenza A by PCR NEGATIVE NEGATIVE Final   Influenza B by PCR NEGATIVE NEGATIVE Final    Comment: (NOTE) The Xpert Xpress SARS-CoV-2/FLU/RSV plus assay is intended as an aid in the diagnosis of influenza from Nasopharyngeal swab specimens and should not be used as a sole basis for treatment. Nasal washings and aspirates are unacceptable for Xpert Xpress SARS-CoV-2/FLU/RSV testing.  Fact Sheet  for Patients: EntrepreneurPulse.com.au  Fact Sheet for Healthcare Providers: IncredibleEmployment.be  This test is not yet approved or cleared by the [place] FDA and has been authorized for detection and/or diagnosis of SARS-CoV-2 by FDA under an Emergency Use Authorization (EUA). This EUA will remain in effect (meaning this test can be used) for the duration of the COVID-19 declaration under Section 564(b)(1) of the Act, 21 U.S.C. section 360bbb-3(b)(1), unless the authorization is terminated or revoked.  Performed at [facility], [place] [address], [place], [place] [address]   Expectorated Sputum Assessment w Gram Stain, Rflx to Resp Cult     Status: None   Collection Time: 01/24/21  6:24 AM   Specimen: Expectorated Sputum  Result Value Ref Range Status   Specimen Description EXPECTORATED SPUTUM  Final   Special Requests NONE  Final   Sputum evaluation   Final    THIS SPECIMEN IS ACCEPTABLE FOR SPUTUM CULTURE Performed at [facility], [place] [address], [place], [place] [address]    Report Status 01/24/2021 FINAL  Final  Culture, Respiratory w Gram Stain     Status: None   Collection Time: 01/24/21  6:24 AM  Result Value Ref Range  Status   Specimen Description   Final    EXPECTORATED SPUTUM Performed at [facility], [place] [address] [place], [place] [address]    Special Requests   Final    NONE Reflexed from [id number] Performed at [facility], [place] [address] [place], [place] [address]    Gram Stain   Final    ABUNDANT WBC PRESENT,BOTH PMN AND MONONUCLEAR FEW GRAM NEGATIVE RODS FEW GRAM POSITIVE COCCI RARE GRAM VARIABLE ROD    Culture   Final    MODERATE Normal respiratory flora-no Staph aureus or Pseudomonas seen Performed at [facility], [address], [place], [place] [address]    Report Status 01/26/2021 FINAL  Final      Radiology Studies: DG CHEST PORT 1 VIEW  Result Date: 01/26/2021 CLINICAL DATA:  Covid +, increased sob, difficulty breathing, decreased O2 sats this a.m, EXAM: PORTABLE CHEST - 1 VIEW COMPARISON:  01/24/2021 FINDINGS: The airspace disease in the left upper lobe has moderately improved. There is developing airspace opacity in the right mid lung. Heart size mildly enlarged for technique. No effusion.  No pneumothorax. Visualized bones unremarkable. IMPRESSION: Improving left upper lobe and worsening right mid lung airspace opacities. Electronically Signed   By: Lucrezia Europe M.D.   On: 01/26/2021 07:32    Scheduled Meds:  (feeding supplement) PROSource Plus  30 mL Oral BID BM   amLODipine  10 mg Oral Daily   enoxaparin (LOVENOX) injection  40 mg Subcutaneous Q24H   hydrALAZINE  75 mg Oral Q8H   insulin aspart  0-5 Units Subcutaneous QHS   insulin aspart  0-9 Units Subcutaneous TID WC   insulin aspart  4 Units Subcutaneous TID WC   insulin glargine-yfgn  24 Units Subcutaneous Daily   linagliptin  5 mg Oral Daily   methylPREDNISolone (SOLU-MEDROL) injection  120 mg Intravenous Q24H   montelukast  10 mg Oral Daily   multivitamin with minerals  1 tablet Oral Daily   nebivolol  20 mg Oral QHS   pantoprazole  40 mg Oral Daily   pravastatin  20  mg Oral QHS   pregabalin  200 mg Oral Daily   pregabalin  300 mg Oral QHS   Ensure Max Protein  11 oz Oral  Daily   sodium chloride flush  3 mL Intravenous Q12H   traZODone  100 mg Oral QHS   Continuous Infusions:  azithromycin Stopped (01/26/21 2250)   cefTRIAXone (ROCEPHIN)  IV Stopped (01/26/21 2135)   remdesivir 100 mg in NS 100 mL 100 mg (01/26/21 0838)     LOS: 4 days   Time spent: 35 minutes.  Little Ishikawa, DO [facility] [email or web address] 01/27/2021, 7:46 AM

## 2021-01-28 DIAGNOSIS — N179 Acute kidney failure, unspecified: Secondary | ICD-10-CM | POA: Diagnosis not present

## 2021-01-28 DIAGNOSIS — U071 COVID-19: Secondary | ICD-10-CM | POA: Diagnosis not present

## 2021-01-28 DIAGNOSIS — J9600 Acute respiratory failure, unspecified whether with hypoxia or hypercapnia: Secondary | ICD-10-CM | POA: Diagnosis not present

## 2021-01-28 LAB — CULTURE, BLOOD (ROUTINE X 2)
Culture: NO GROWTH
Culture: NO GROWTH
Special Requests: ADEQUATE
Special Requests: ADEQUATE

## 2021-01-28 LAB — CBC WITH DIFFERENTIAL/PLATELET
Abs Immature Granulocytes: 0.08 10*3/uL — ABNORMAL HIGH (ref 0.00–0.07)
Basophils Absolute: 0 10*3/uL (ref 0.0–0.1)
Basophils Relative: 0 %
Eosinophils Absolute: 0 10*3/uL (ref 0.0–0.5)
Eosinophils Relative: 0 %
HCT: 29.6 % — ABNORMAL LOW (ref 39.0–52.0)
Hemoglobin: 10.2 g/dL — ABNORMAL LOW (ref 13.0–17.0)
Immature Granulocytes: 1 %
Lymphocytes Relative: 7 %
Lymphs Abs: 0.5 10*3/uL — ABNORMAL LOW (ref 0.7–4.0)
MCH: 30.4 pg (ref 26.0–34.0)
MCHC: 34.5 g/dL (ref 30.0–36.0)
MCV: 88.4 fL (ref 80.0–100.0)
Monocytes Absolute: 0.4 10*3/uL (ref 0.1–1.0)
Monocytes Relative: 6 %
Neutro Abs: 7 10*3/uL (ref 1.7–7.7)
Neutrophils Relative %: 86 %
Platelets: 173 10*3/uL (ref 150–400)
RBC: 3.35 MIL/uL — ABNORMAL LOW (ref 4.22–5.81)
RDW: 13.6 % (ref 11.5–15.5)
WBC: 8.1 10*3/uL (ref 4.0–10.5)
nRBC: 0 % (ref 0.0–0.2)

## 2021-01-28 LAB — GLUCOSE, CAPILLARY
Glucose-Capillary: 333 mg/dL — ABNORMAL HIGH (ref 70–99)
Glucose-Capillary: 363 mg/dL — ABNORMAL HIGH (ref 70–99)
Glucose-Capillary: 323 mg/dL — ABNORMAL HIGH (ref 70–99)
Glucose-Capillary: 349 mg/dL — ABNORMAL HIGH (ref 70–99)

## 2021-01-28 LAB — COMPREHENSIVE METABOLIC PANEL
ALT: 20 U/L (ref 0–44)
AST: 15 U/L (ref 15–41)
Albumin: 2.8 g/dL — ABNORMAL LOW (ref 3.5–5.0)
Alkaline Phosphatase: 34 U/L — ABNORMAL LOW (ref 38–126)
Anion gap: 9 (ref 5–15)
BUN: 54 mg/dL — ABNORMAL HIGH (ref 8–23)
CO2: 22 mmol/L (ref 22–32)
Calcium: 7.8 mg/dL — ABNORMAL LOW (ref 8.9–10.3)
Chloride: 100 mmol/L (ref 98–111)
Creatinine, Ser: 1.5 mg/dL — ABNORMAL HIGH (ref 0.61–1.24)
GFR, Estimated: 51 mL/min — ABNORMAL LOW (ref >60)
Glucose, Bld: 354 mg/dL — ABNORMAL HIGH (ref 70–99)
Potassium: 3.6 mmol/L (ref 3.5–5.1)
Sodium: 131 mmol/L — ABNORMAL LOW (ref 135–145)
Total Bilirubin: 0.8 mg/dL (ref 0.3–1.2)
Total Protein: 6 g/dL — ABNORMAL LOW (ref 6.5–8.1)

## 2021-01-28 LAB — C-REACTIVE PROTEIN: CRP: 6.9 mg/dL — ABNORMAL HIGH (ref <1.0)

## 2021-01-28 MED ORDER — INSULIN GLARGINE-YFGN 100 UNIT/ML ~~LOC~~ SOLN
35.0000 [IU] | Freq: Two times a day (BID) | SUBCUTANEOUS | Status: DC
  Administered 2021-01-28 – 2021-01-29 (×3): 35 [IU] via SUBCUTANEOUS
  Filled 2021-01-28 (×4): qty 0.35

## 2021-01-28 NOTE — Plan of Care (Signed)

## 2021-01-28 NOTE — Progress Notes (Signed)
PROGRESS NOTE  Jesse Lane  BZJ:[id number] DOB: 04/12/1955 DOA: 01/23/2021 PCP: [name], MD  Outpatient Specialists: Mostly in [place] Brief Narrative: Jesse Lane is a 65 y.o. male with a history of IDT2DM, gastroparesis, HTN, melanoma metastases to lymph nodes and intestines with unclear primary source s/p [place] who presented to the ED 10/17 with profuse diarrhea worsening over several days as well as worsening shortness of breath for 1 day, all beginning several days after covid exposure. SARS-CoV-2 Ag was confirmed to be positive. He was hypoxic on arrival with left-sided infiltrate. CRP and PCT grossly elevated. Antibiotics and remdesivir started on admission. Steroids also given, though temporarily held due to history of severe steroid-induced hyperglycemia and suspicion for severe bacterial infection. Labs have improved, though hypoxia worsened on 10/20 for which solumedrol is started.   Assessment & Plan:  Acute hypoxic respiratory failure, multifactorial,  Concurrent CAP and COVID pna both POA, improving:  - Notable bacterial CAP noted on intake LLL and elevated procalcitonin - Worsening patchy infiltrate on imaging likely secondary to COVID PNA -Continue oxygen, wean aggressively, discussed early ambulation, proning and incentive spirometry at length today at bedside -Appears euvolemic today hold diuresis - Continue ceftriaxone, azithromycin for community-acquired pneumonia coverage for 5 days -Continue Remdesivir and steroids -holding immunomodulators in the setting of bacterial pneumonia  Covid gastroenteritis: Symptoms overall improved.   Hemoptysis: In setting of both bacterial and viral pneumonia and thrombocytopenia (which has resolved) - Continue VTE ppx.  Insulin-dependent diabetes mellitus type 2 -Currently uncontrolled in the setting of steroids:  - Chronically well-controlled HbA1c (5.9%). - Continue linagliptin, increase to resistant sliding  scale insulin and increase glargine to 35 units twice daily (20u previously)  Gastroparesis:  -Chronic, currently well controlled, pt no longer takes reglan  AOCD:  Stable  Hypokalemia:  Improved with supplementation.  HTN:  - Continue norvasc, nebivolol - Started hydralazine for high BP while holding thiazide and ACEi due to AKI. Will increase dose again today.  AKI, POA, resolved:  -Continue to hold IV fluids, no indication for Lasix given euvolemia today   Severe protein calorie malnutrition:  - Supplement protein. Prealbumin low at 15.   Chronic HFpEF: Stable  Incidentally noted troponin elevation, ACS ruled out: - Likely secondary to supply demand mismatch/hypoxia - Patient remains without chest pain, EKG without ST changes, no further indication for inpatient work-up   DVT prophylaxis: SCDs, Lovenox Code Status: Full Family Communication: None available Disposition Plan:  Status is: Inpatient DC: Likely home in the next 24-48h pending clinical course and hypoxia resolution Remains inpatient appropriate because: Inpatient treatments required.  Consultants:  None  Procedures:  None  Antimicrobials: Ceftriaxone, azithromycin Remdesivir   Subjective: no acute issues or events overnight, continues to report remarkable improvement in symptoms, denies nausea vomiting diarrhea constipation headache fevers chills or chest pain.  Objective: Vitals:   01/27/21 1248 01/27/21 1400 01/27/21 1900 01/28/21 0503  BP: (!) 145/93  (!) 163/84 (!) 142/77  Pulse: 65  75 64  Resp: 20  20 18   Temp: 98.4 F (36.9 C)  97.6 F (36.4 C) 98 F (36.7 C)  TempSrc: Oral  Oral Oral  SpO2: 91% 92% 96% 98%  Weight:      Height:        Intake/Output Summary (Last 24 hours) at 01/28/2021 0749 Last data filed at 01/28/2021 0000 Gross per 24 hour  Intake 833 ml  Output 1600 ml  Net -767 ml    [employer]  01/23/21 1900  Weight: 93 kg   General:  Pleasantly resting in  bed, No acute distress. HEENT:  Normocephalic atraumatic.  Sclerae nonicteric, noninjected.  Extraocular movements intact bilaterally. Neck:  Without mass or deformity.  Trachea is midline. Lungs: Diminished breath sounds bilaterally without overt wheezes rhonchi or rales. Heart:  Regular rate and rhythm.  Without murmurs, rubs, or gallops. Abdomen:  Soft, nontender, nondistended.  Without guarding or rebound. Extremities: Without cyanosis, clubbing, edema, or obvious deformity. Vascular:  Dorsalis pedis and posterior tibial pulses palpable bilaterally. Skin:  Warm and dry, no erythema, no ulcerations.  Data Reviewed: I have personally reviewed following labs and imaging studies  CBC: Recent Labs  Lab 01/24/21 0334 01/25/21 0351 01/26/21 0351 01/27/21 0340 01/28/21 0415  WBC 9.1 7.2 7.8 3.3* 8.1  NEUTROABS 8.1* 6.2 6.4 2.9 7.0  HGB 11.5* 11.1* 10.1* 9.6* 10.2*  HCT 34.7* 32.3* 30.2* 29.0* 29.6*  MCV 91.6 89.7 91.2 90.1 88.4  PLT 125* 139* 155 145* 426    Basic Metabolic Panel: Recent Labs  Lab 01/24/21 0059 01/24/21 0334 01/25/21 0351 01/26/21 0351 01/27/21 0340 01/28/21 0415  NA  --  138 141 138 130* 131*  K  --  3.7 3.8 3.4* 3.9 3.6  CL  --  105 109 106 101 100  CO2  --  23 22 20* 20* 22  GLUCOSE  --  298* 310* 159* 482* 354*  BUN  --  39* 49* 47* 54* 54*  CREATININE  --  1.65* 1.35* 1.15 1.61* 1.50*  CALCIUM  --  7.6* 7.9* 7.9* 7.6* 7.8*  MG 1.9 2.0  --   --   --   --   PHOS 4.7* 3.4  --   --   --   --     GFR: Estimated Creatinine Clearance: 56.5 mL/min (A) (by C-G formula based on SCr of 1.5 mg/dL (H)). Liver Function Tests: Recent Labs  Lab 01/24/21 0334 01/25/21 0351 01/26/21 0351 01/27/21 0340 01/28/21 0415  AST 21 17 22 21 15   ALT 17 14 19 23 20   ALKPHOS 40 36* 38 35* 34*  BILITOT 1.0 1.1 1.5* 1.1 0.8  PROT 6.5 6.4* 6.0* 5.9* 6.0*  ALBUMIN 3.3* 3.0* 2.9* 2.8* 2.8*    No results for input(s): LIPASE, AMYLASE in the last 168 hours. No results  for input(s): AMMONIA in the last 168 hours. Coagulation Profile: No results for input(s): INR, PROTIME in the last 168 hours. Cardiac Enzymes: Recent Labs  Lab 01/24/21 0059  CKTOTAL 194    BNP (last 3 results) No results for input(s): PROBNP in the last 8760 hours. HbA1C: No results for input(s): HGBA1C in the last 72 hours.  CBG: Recent Labs  Lab 01/27/21 0754 01/27/21 1235 01/27/21 1752 01/27/21 2032 01/28/21 0742  GLUCAP 449* 538* 497* 502* 333*    Lipid Profile: No results for input(s): CHOL, HDL, LDLCALC, TRIG, CHOLHDL, LDLDIRECT in the last 72 hours.  Thyroid Function Tests: No results for input(s): TSH, T4TOTAL, FREET4, T3FREE, THYROIDAB in the last 72 hours. Anemia Panel: No results for input(s): VITAMINB12, FOLATE, FERRITIN, TIBC, IRON, RETICCTPCT in the last 72 hours.  Urine analysis:    Component Value Date/Time   COLORURINE YELLOW 08/27/2020 1930   APPEARANCEUR CLEAR 08/27/2020 1930   LABSPEC 1.023 08/27/2020 1930   PHURINE 5.0 08/27/2020 1930   GLUCOSEU 50 (A) 08/27/2020 1930   HGBUR MODERATE (A) 08/27/2020 [place] NEGATIVE 08/27/2020 [place] NEGATIVE 08/27/2020 1930  PROTEINUR >=300 (A) 08/27/2020 1930   UROBILINOGEN 0.2 12/05/2011 1655   NITRITE NEGATIVE 08/27/2020 1930   LEUKOCYTESUR NEGATIVE 08/27/2020 1930   Recent Results (from the past 240 hour(s))  Blood Culture (routine x 2)     Status: None (Preliminary result)   Collection Time: 01/23/21  8:39 PM   Specimen: Site Not Specified; Blood  Result Value Ref Range Status   Specimen Description   Final    SITE NOT SPECIFIED BLOOD Performed at [facility], [place] [address] [place], [place] [address]    Special Requests   Final    Blood Culture adequate volume BOTTLES DRAWN AEROBIC AND ANAEROBIC Performed at [place] [address], [place], [place] [address]    Culture   Final    NO GROWTH 4 DAYS Performed at [facility],  [place] [address] [place], [place] [address]    Report Status PENDING  Incomplete  Blood Culture (routine x 2)     Status: None (Preliminary result)   Collection Time: 01/23/21  8:40 PM   Specimen: Site Not Specified; Blood  Result Value Ref Range Status   Specimen Description   Final    SITE NOT SPECIFIED BLOOD Performed at [facility], [place] [address], [place], [place] [address]    Special Requests   Final    BOTTLES DRAWN AEROBIC AND ANAEROBIC Blood Culture adequate volume Performed at [place] [address] [place], [place] [address]    Culture   Final    NO GROWTH 4 DAYS Performed at [facility], [place] [address] [place], [place] [address]    Report Status PENDING  Incomplete  Resp Panel by RT-PCR (Flu A&B, Covid) Nasopharyngeal Swab     Status: Abnormal   Collection Time: 01/23/21  8:45 PM   Specimen: Nasopharyngeal Swab; Nasopharyngeal(NP) swabs in vial transport medium  Result Value Ref Range Status   SARS Coronavirus 2 by RT PCR POSITIVE (A) NEGATIVE Final    Comment: RESULT CALLED TO, READ BACK BY AND VERIFIED WITH:  Willeen Cass RN 01/23/21 @ 2255 (NOTE) SARS-CoV-2 target nucleic acids are DETECTED.  The SARS-CoV-2 RNA is generally detectable in upper respiratory specimens during the acute phase of infection. Positive results are indicative of the presence of the identified virus, but do not rule out bacterial infection or co-infection with other pathogens not detected by the test. Clinical correlation with patient history and other diagnostic information is necessary to determine patient infection status. The expected result is Negative.  Fact Sheet for Patients: EntrepreneurPulse.com.au  Fact Sheet for Healthcare Providers: IncredibleEmployment.be  This test is not yet approved or cleared by the [place] FDA and  has been authorized for detection and/or diagnosis of SARS-CoV-2 by FDA  under an Emergency Use Authorization (EUA).  This EUA will remain in effect (meaning this test can b e used) for the duration of  the COVID-19 declaration under Section 564(b)(1) of the Act, 21 U.S.C. section 360bbb-3(b)(1), unless the authorization is terminated or revoked sooner.     Influenza A by PCR NEGATIVE NEGATIVE Final   Influenza B by PCR NEGATIVE NEGATIVE Final    Comment: (NOTE) The Xpert Xpress SARS-CoV-2/FLU/RSV plus assay is intended as an aid in the diagnosis of influenza from Nasopharyngeal swab specimens and should not be used as a sole basis for treatment. Nasal washings and aspirates are unacceptable for Xpert Xpress SARS-CoV-2/FLU/RSV testing.  Fact Sheet for Patients: EntrepreneurPulse.com.au  Fact Sheet for Healthcare Providers: IncredibleEmployment.be  This test is not yet approved or cleared by the [place] FDA and has been authorized for detection and/or diagnosis of SARS-CoV-2 by FDA under an Emergency Use Authorization (EUA). This EUA will remain in effect (meaning this test can be used) for the duration of the COVID-19 declaration under Section 564(b)(1) of the Act, 21 U.S.C. section 360bbb-3(b)(1), unless the authorization is terminated or revoked.  Performed at [facility], [place] [address], [place], [place] [address]   Expectorated Sputum Assessment w Gram Stain, Rflx to Resp Cult     Status: None   Collection Time: 01/24/21  6:24 AM   Specimen: Expectorated Sputum  Result Value Ref Range Status   Specimen Description EXPECTORATED SPUTUM  Final   Special Requests NONE  Final   Sputum evaluation   Final    THIS SPECIMEN IS ACCEPTABLE FOR SPUTUM CULTURE Performed at [facility], [place] [address], [place], [place] [address]    Report Status 01/24/2021 FINAL  Final  Culture, Respiratory w Gram Stain     Status: None   Collection Time: 01/24/21  6:24 AM  Result Value  Ref Range Status   Specimen Description   Final    EXPECTORATED SPUTUM Performed at [facility], [place] [address], [place], [place] [address]    Special Requests   Final    NONE Reflexed from [id number] Performed at [facility], [place] [address] [place], [place] [address]    Gram Stain   Final    ABUNDANT WBC PRESENT,BOTH PMN AND MONONUCLEAR FEW GRAM NEGATIVE RODS FEW GRAM POSITIVE COCCI RARE GRAM VARIABLE ROD    Culture   Final    MODERATE Normal respiratory flora-no Staph aureus or Pseudomonas seen Performed at [facility], [address], [place], [place] [address]    Report Status 01/26/2021 FINAL  Final      Radiology Studies: No results found.  Scheduled Meds:  (feeding supplement) PROSource Plus  30 mL Oral BID BM   amLODipine  10 mg Oral Daily   enoxaparin (LOVENOX) injection  40 mg Subcutaneous Q24H   hydrALAZINE  75 mg Oral Q8H   insulin aspart  0-20 Units Subcutaneous TID WC   insulin aspart  0-5 Units Subcutaneous QHS   insulin aspart  4 Units Subcutaneous TID WC   insulin glargine-yfgn  20 Units Subcutaneous BID   linagliptin  5 mg Oral Daily   methylPREDNISolone (SOLU-MEDROL) injection  120 mg Intravenous Q24H   montelukast  10 mg Oral Daily   multivitamin with minerals  1 tablet Oral Daily   nebivolol  20 mg Oral QHS   pantoprazole  40 mg Oral Daily   pravastatin  20 mg Oral QHS   pregabalin  200 mg Oral Daily   pregabalin  300 mg Oral QHS   Ensure Max Protein  11 oz Oral Daily   sodium chloride flush  3 mL Intravenous Q12H   traZODone  100 mg Oral QHS   Continuous Infusions:     LOS: 5 days   Time spent: 35 minutes.  Little Ishikawa, DO [facility] [email or web address] 01/28/2021, 7:49 AM

## 2021-01-28 NOTE — Progress Notes (Signed)
Results for KULE, GASCOIGNE" (MRN [id number]) as of 01/28/2021 13:50  Ref. Range 01/27/2021 20:32 01/28/2021 07:42 01/28/2021 12:32  Glucose-Capillary Latest Ref Range: 70 - 99 mg/dL 502 (HH) 333 (H) 363 (H)  Noted that blood sugars have been greater than 300 mg/dl.  Noted that current meds are: Semglee 35 units BID (starting today), Novolog RESISTANT correction scale TID & HS scale, and Novolog 4 units TID with meals.  If blood sugars continue to be elevated and while on steroids, recommend increasing Novolog meal coverage to 6-8 units TID if patient eats at least 50% of meal. Titrate dosages as needed.   Harvel Ricks RN BSN CDE Diabetes Coordinator Pager: [phone]  8am-5pm

## 2021-01-29 DIAGNOSIS — D638 Anemia in other chronic diseases classified elsewhere: Secondary | ICD-10-CM | POA: Diagnosis not present

## 2021-01-29 DIAGNOSIS — I10 Essential (primary) hypertension: Secondary | ICD-10-CM | POA: Diagnosis not present

## 2021-01-29 DIAGNOSIS — N179 Acute kidney failure, unspecified: Secondary | ICD-10-CM | POA: Diagnosis not present

## 2021-01-29 DIAGNOSIS — U071 COVID-19: Secondary | ICD-10-CM | POA: Diagnosis not present

## 2021-01-29 LAB — GLUCOSE, CAPILLARY
Glucose-Capillary: 375 mg/dL — ABNORMAL HIGH (ref 70–99)
Glucose-Capillary: 403 mg/dL — ABNORMAL HIGH (ref 70–99)
Glucose-Capillary: 402 mg/dL — ABNORMAL HIGH (ref 70–99)
Glucose-Capillary: 312 mg/dL — ABNORMAL HIGH (ref 70–99)

## 2021-01-29 MED ORDER — PREDNISONE 10 MG PO TABS: ORAL_TABLET | ORAL | 0 refills | Status: AC

## 2021-01-29 MED ORDER — INSULIN ASPART 100 UNIT/ML IJ SOLN
20.0000 [IU] | Freq: Once | INTRAMUSCULAR | Status: AC
  Administered 2021-01-29: 20 [IU] via SUBCUTANEOUS

## 2021-01-29 NOTE — Progress Notes (Signed)
Results for FURKAN, KEENUM" (MRN [id number]) as of 01/29/2021 09:03  Ref. Range 01/28/2021 07:42 01/28/2021 12:32 01/28/2021 16:28 01/28/2021 21:35 01/29/2021 07:48  Glucose-Capillary Latest Ref Range: 70 - 99 mg/dL 333 (H) 363 (H) 323 (H) 349 (H) 375 (H)  Noted that blood sugars are now in the 300's.   Recommend increasing Semglee to 40 units BID if blood sugars continue to be elevated.  Harvel Ricks RN BSN CDE Diabetes Coordinator Pager: [phone]  8am-5pm

## 2021-01-29 NOTE — Discharge Summary (Signed)
Physician Discharge Summary  Jesse Lane ZLD:[id number] DOB: Jul 02, 1955 DOA: 01/23/2021  PCP: [name], MD  Admit date: 01/23/2021 Discharge date: 01/29/2021  Admitted From: Home Disposition: Home  Recommendations for Outpatient Follow-up:  Follow up with PCP in 1-2 weeks Please obtain BMP/CBC in one week  Discharge Condition: Stable CODE STATUS: Full Diet recommendation: As tolerated  Brief/Interim Summary: Jesse Lane is a 65 y.o. male with a history of IDT2DM, gastroparesis, HTN, melanoma metastases to lymph nodes and intestines with unclear primary source s/p [place] who presented to the ED 10/17 with profuse diarrhea worsening over several days as well as worsening shortness of breath for 1 day, all beginning several days after covid exposure. SARS-CoV-2 Ag was confirmed to be positive. He was hypoxic on arrival with left-sided infiltrate. CRP and PCT grossly elevated. Antibiotics and remdesivir started on admission. Steroids also given, though temporarily held due to history of severe steroid-induced hyperglycemia and suspicion for severe bacterial infection.   Patient clinically worsening early in hospital stay concerning for worsening COVID pneumonia on top of bacterial pneumonia.  Over the past 48 hours patient's hypoxia has resolved, his symptoms have drastically improved, he remains hyperglycemic in the setting of high-dose steroids which we discussed would need to be monitored closely at home but is otherwise stable and agreeable for discharge home.  Labs normalizing over the past 48 hours, tolerating p.o. well and without complaints of nausea vomiting diarrhea headache fevers or chills.  Discussed resuming home medications with the addition of steroid taper.  Patient will need to monitor insulin use and glucose levels closely at home over the next 72 hours as his steroid doses taper.  Close follow-up with PCP in the next week as scheduled.  Otherwise continue  quarantine per CDC guidelines, patient's roommate also has upper respiratory illness presumed to be COVID but does not appear to have positive testing at this time.   Assessment & Plan:   Acute hypoxic respiratory failure, multifactorial,  Concurrent CAP and COVID pna both POA, improving:  - Notable bacterial CAP noted on intake LLL and elevated procalcitonin - Worsening patchy infiltrate on imaging likely secondary to COVID PNA -Weaned off oxygen as above, completed Remdesivir, continue steroid taper at discharge   Covid gastroenteritis: Symptoms overall improved.    Hemoptysis: In setting of both bacterial and viral pneumonia and thrombocytopenia (which has resolved) - Continue VTE ppx.   Insulin-dependent diabetes mellitus type 2 -A1c 5.9% at intake, well controlled diabetes and glucose levels in the outpatient setting -Continue to monitor closely with Dexcom and glucometer, discussed transitioning patient to higher dose insulin at discharge however given his steroid taper he would likely have issues with hypoglycemia in the next few days, discussed very tight diet control over the next 3 to 5 days and close monitoring at home with ongoing sliding scale insulin per his previous protocol.   Gastroparesis:  -Chronic, currently well controlled, pt no longer takes reglan   Hypokalemia:  Improved with supplementation.   HTN:  - Continue antihypertensive medications at previous home dose, blood pressure appears to be well controlled   AKI, POA, resolved:  -Continue to hold IV fluids, no indication for Lasix given euvolemia today    Severe protein calorie malnutrition:  - Supplement protein. Prealbumin low at 15.    Chronic HFpEF: Stable   Incidentally noted troponin elevation, ACS ruled out: - Likely secondary to supply demand mismatch/hypoxia - Patient remains without chest pain, EKG without ST changes, no further indication  for inpatient work-up   Discharge  Instructions  Discharge Instructions     Call MD for:  difficulty breathing, headache or visual disturbances   Complete by: As directed    Diet - low sodium heart healthy   Complete by: As directed    Increase activity slowly   Complete by: As directed       Allergies as of 01/29/2021       Reactions   Contrast Media [iodinated Diagnostic Agents] Itching        Medication List     STOP taking these medications    alendronate 70 MG tablet Commonly known as: FOSAMAX   alum & mag hydroxide-simeth 200-200-20 MG/5ML suspension Commonly known as: MAALOX/MYLANTA   aspirin 81 MG EC tablet   atorvastatin 40 MG tablet Commonly known as: LIPITOR   metoCLOPramide 10 MG tablet Commonly known as: REGLAN       TAKE these medications    amLODipine 10 MG tablet Commonly known as: NORVASC Take 10 mg by mouth daily.   Bystolic 20 MG Tabs Generic drug: Nebivolol HCl Take 20 mg by mouth at bedtime.   Dexcom G6 Receiver Devi by Does not apply route.   Dexcom G6 Sensor Misc by Does not apply route.   Dexcom G6 Transmitter Misc by Does not apply route.   diphenhydramine-acetaminophen 25-500 MG Tabs tablet Commonly known as: TYLENOL PM Take 2 tablets by mouth at bedtime as needed (sleep).   hydrochlorothiazide 25 MG tablet Commonly known as: HYDRODIURIL Take 25 mg by mouth at bedtime.   insulin lispro 100 UNIT/ML KwikPen Commonly known as: HUMALOG Inject 0-20 Units into the skin 3 (three) times daily. Per sliding scale   Insulin Pen Needle 32G X 6 MM Misc Commonly known as: BD Pen Needle Micro U/F 1 each by Other route See admin instructions. Use one pen needle to inject insulin 4 times daily. **must have appointment for additional refills.** What changed: Another medication with the same name was changed. Make sure you understand how and when to take each.   Insulin Pen Needle 32G X 6 MM Misc Commonly known as: BD Pen Needle Micro U/F USE 1 PEN NEEDLE SIX  TIMES DAILY What changed:  how much to take how to take this when to take this   lisinopril 40 MG tablet Commonly known as: ZESTRIL Take 40 mg by mouth at bedtime.   montelukast 10 MG tablet Commonly known as: SINGULAIR Take 10 mg by mouth at bedtime.   multivitamin with minerals Tabs tablet Take 1 tablet by mouth daily.   ondansetron 4 MG tablet Commonly known as: ZOFRAN Take 1 tablet (4 mg total) by mouth every 6 (six) hours as needed for nausea.   pantoprazole 40 MG tablet Commonly known as: PROTONIX Take 1 tablet by mouth twice daily What changed: when to take this   pravastatin 20 MG tablet Commonly known as: PRAVACHOL Take 20 mg by mouth daily.   predniSONE 10 MG tablet Commonly known as: DELTASONE Take 4 tablets (40 mg total) by mouth daily for 3 days, THEN 3 tablets (30 mg total) daily for 3 days, THEN 2 tablets (20 mg total) daily for 3 days, THEN 1 tablet (10 mg total) daily for 3 days. Start taking on: January 29, 2021   pregabalin 100 MG capsule Commonly known as: LYRICA Take 100-300 mg by mouth See admin instructions. Take 2 capsule (200 mg) by mouth every morning and take three capsules (300 mg) at bedtime  PROBIOTIC PO Take 1 tablet by mouth daily. gummies   traZODone 100 MG tablet Commonly known as: DESYREL Take 100 mg by mouth at bedtime.   Tyler Aas FlexTouch 100 UNIT/ML FlexTouch Pen Generic drug: insulin degludec Inject 24 Units into the skin daily before breakfast.        Allergies  Allergen Reactions   Contrast Media [Iodinated Diagnostic Agents] Itching    Consultations: None   Procedures/Studies: DG CHEST PORT 1 VIEW  Result Date: 01/26/2021 CLINICAL DATA:  Covid +, increased sob, difficulty breathing, decreased O2 sats this a.m, EXAM: PORTABLE CHEST - 1 VIEW COMPARISON:  01/24/2021 FINDINGS: The airspace disease in the left upper lobe has moderately improved. There is developing airspace opacity in the right mid lung. Heart  size mildly enlarged for technique. No effusion.  No pneumothorax. Visualized bones unremarkable. IMPRESSION: Improving left upper lobe and worsening right mid lung airspace opacities. Electronically Signed   By: Lucrezia Europe M.D.   On: 01/26/2021 07:32   DG CHEST PORT 1 VIEW  Result Date: 01/24/2021 CLINICAL DATA:  Acute respiratory failure. EXAM: PORTABLE CHEST 1 VIEW COMPARISON:  Chest x-ray 01/23/2021. FINDINGS: Again seen is patchy airspace disease in the left mid and upper lung similar to the prior study. Right lung is clear. Cardiomediastinal silhouette is stable, the heart is mildly enlarged. There is no pleural effusion or pneumothorax. No acute fractures are seen. IMPRESSION: 1. Stable left mid and upper lung airspace disease worrisome for pneumonia. And Electronically Signed   By: Ronney Asters M.D.   On: 01/24/2021 19:12   DG Chest Port 1 View  Result Date: 01/23/2021 CLINICAL DATA:  Fever, COVID. EXAM: PORTABLE CHEST 1 VIEW COMPARISON:  Chest x-ray 10/17/2019. FINDINGS: There is dense patchy airspace consolidation throughout the left mid lung. Costophrenic angles are clear. No pneumothorax. Cardiomediastinal silhouette within normal limits. Osseous structures are within normal limits. IMPRESSION: Multifocal airspace disease in the left mid lung compatible with pneumonia. Followup PA and lateral chest X-ray is recommended in 3-4 weeks following trial of antibiotic therapy to ensure resolution and exclude underlying malignancy. Electronically Signed   By: Ronney Asters M.D.   On: 01/23/2021 20:26   DG Abd 2 Views  Result Date: 01/03/2021 CLINICAL DATA:  Constipation with possible overflow diarrhea. EXAM: ABDOMEN - 2 VIEW COMPARISON:  CT 08/27/2020. FINDINGS: Large amount of stool noted throughout the colon. Nonspecific nondistended air-filled loops of small bowel noted. No free air. Degenerative changes lumbar spine and both hips. IMPRESSION: Large amount of stool noted throughout the colon  consistent with the patient's history of constipation. No bowel distention. Electronically Signed   By: Marcello Moores  Register M.D.   On: 01/03/2021 09:51     Subjective: No acute issues or events overnight   Discharge Exam: Vitals:   01/29/21 1031 01/29/21 1200  BP:  (!) 165/87  Pulse:  69  Resp:  18  Temp:  98.2 F (36.8 C)  SpO2: 93% 94%   Vitals:   01/28/21 2300 01/29/21 0545 01/29/21 1031 01/29/21 1200  BP: (!) 152/83 (!) 166/81  (!) 165/87  Pulse: 70 63  69  Resp: 19 20  18   Temp: 98 F (36.7 C) 98.2 F (36.8 C)  98.2 F (36.8 C)  TempSrc: Oral Oral  Oral  SpO2: 96% 98% 93% 94%  Weight:      Height:        General: Pt is alert, awake, not in acute distress Cardiovascular: RRR, S1/S2 +, no rubs, no gallops Respiratory:  CTA bilaterally, no wheezing, no rhonchi Abdominal: Soft, NT, ND, bowel sounds + Extremities: no edema, no cyanosis    The results of significant diagnostics from this hospitalization (including imaging, microbiology, ancillary and laboratory) are listed below for reference.     Microbiology: Recent Results (from the past 240 hour(s))  Blood Culture (routine x 2)     Status: None   Collection Time: 01/23/21  8:39 PM   Specimen: Site Not Specified; Blood  Result Value Ref Range Status   Specimen Description   Final    SITE NOT SPECIFIED BLOOD Performed at [facility], [place] [address], [place], [place] [address]    Special Requests   Final    Blood Culture adequate volume BOTTLES DRAWN AEROBIC AND ANAEROBIC Performed at [place] [address] [place], [place] [address]    Culture   Final    NO GROWTH 5 DAYS Performed at [facility], [place] [address], [place], [place] [address]    Report Status 01/28/2021 FINAL  Final  Blood Culture (routine x 2)     Status: None   Collection Time: 01/23/21  8:40 PM   Specimen: Site Not Specified; Blood  Result Value Ref Range Status   Specimen Description   Final     SITE NOT SPECIFIED BLOOD Performed at [facility], [address] [place], [place] [address]    Special Requests   Final    BOTTLES DRAWN AEROBIC AND ANAEROBIC Blood Culture adequate volume Performed at [place] [address], [place], [place] [address]    Culture   Final    NO GROWTH 5 DAYS Performed at [facility], [place] [address] [place], [place] [address]    Report Status 01/28/2021 FINAL  Final  Resp Panel by RT-PCR (Flu A&B, Covid) Nasopharyngeal Swab     Status: Abnormal   Collection Time: 01/23/21  8:45 PM   Specimen: Nasopharyngeal Swab; Nasopharyngeal(NP) swabs in vial transport medium  Result Value Ref Range Status   SARS Coronavirus 2 by RT PCR POSITIVE (A) NEGATIVE Final    Comment: RESULT CALLED TO, READ BACK BY AND VERIFIED WITH:  Willeen Cass RN 01/23/21 @ 6606 (NOTE) SARS-CoV-2 target nucleic acids are DETECTED.  The SARS-CoV-2 RNA is generally detectable in upper respiratory specimens during the acute phase of infection. Positive results are indicative of the presence of the identified virus, but do not rule out bacterial infection or co-infection with other pathogens not detected by the test. Clinical correlation with patient history and other diagnostic information is necessary to determine patient infection status. The expected result is Negative.  Fact Sheet for Patients: EntrepreneurPulse.com.au  Fact Sheet for Healthcare Providers: IncredibleEmployment.be  This test is not yet approved or cleared by the [place] FDA and  has been authorized for detection and/or diagnosis of SARS-CoV-2 by FDA under an Emergency Use Authorization (EUA).  This EUA will remain in effect (meaning this test can b e used) for the duration of  the COVID-19 declaration under Section 564(b)(1) of the Act, 21 U.S.C. section 360bbb-3(b)(1), unless the authorization is terminated or revoked  sooner.     Influenza A by PCR NEGATIVE NEGATIVE Final   Influenza B by PCR NEGATIVE NEGATIVE Final    Comment: (NOTE) The Xpert Xpress SARS-CoV-2/FLU/RSV plus assay is intended as an aid in the diagnosis of influenza from Nasopharyngeal swab specimens and should not be used as a sole basis for treatment.  Nasal washings and aspirates are unacceptable for Xpert Xpress SARS-CoV-2/FLU/RSV testing.  Fact Sheet for Patients: EntrepreneurPulse.com.au  Fact Sheet for Healthcare Providers: IncredibleEmployment.be  This test is not yet approved or cleared by the [place] FDA and has been authorized for detection and/or diagnosis of SARS-CoV-2 by FDA under an Emergency Use Authorization (EUA). This EUA will remain in effect (meaning this test can be used) for the duration of the COVID-19 declaration under Section 564(b)(1) of the Act, 21 U.S.C. section 360bbb-3(b)(1), unless the authorization is terminated or revoked.  Performed at [facility], [place] [address] [place], [place] [address]   Expectorated Sputum Assessment w Gram Stain, Rflx to Resp Cult     Status: None   Collection Time: 01/24/21  6:24 AM   Specimen: Expectorated Sputum  Result Value Ref Range Status   Specimen Description EXPECTORATED SPUTUM  Final   Special Requests NONE  Final   Sputum evaluation   Final    THIS SPECIMEN IS ACCEPTABLE FOR SPUTUM CULTURE Performed at [facility], [place] [address], [place], [place] [address]    Report Status 01/24/2021 FINAL  Final  Culture, Respiratory w Gram Stain     Status: None   Collection Time: 01/24/21  6:24 AM  Result Value Ref Range Status   Specimen Description   Final    EXPECTORATED SPUTUM Performed at [facility], [place] [address], [place], [place] [address]    Special Requests   Final    NONE Reflexed from [id number] Performed at [facility], [place]  [address] [place], [place] [address]    Gram Stain   Final    ABUNDANT WBC PRESENT,BOTH PMN AND MONONUCLEAR FEW GRAM NEGATIVE RODS FEW GRAM POSITIVE COCCI RARE GRAM VARIABLE ROD    Culture   Final    MODERATE Normal respiratory flora-no Staph aureus or Pseudomonas seen Performed at [facility], [address], [place],  [address]    Report Status 01/26/2021 FINAL  Final     Labs: BNP (last 3 results) No results for input(s): BNP in the last 8760 hours. Basic Metabolic Panel: Recent Labs  Lab 01/24/21 0059 01/24/21 0334 01/25/21 0351 01/26/21 0351 01/27/21 0340 01/28/21 0415  NA  --  138 141 138 130* 131*  K  --  3.7 3.8 3.4* 3.9 3.6  CL  --  105 109 106 101 100  CO2  --  23 22 20* 20* 22  GLUCOSE  --  298* 310* 159* 482* 354*  BUN  --  39* 49* 47* 54* 54*  CREATININE  --  1.65* 1.35* 1.15 1.61* 1.50*  CALCIUM  --  7.6* 7.9* 7.9* 7.6* 7.8*  MG 1.9 2.0  --   --   --   --   PHOS 4.7* 3.4  --   --   --   --    Liver Function Tests: Recent Labs  Lab 01/24/21 0334 01/25/21 0351 01/26/21 0351 01/27/21 0340 01/28/21 0415  AST 21 17 22 21 15   ALT 17 14 19 23 20   ALKPHOS 40 36* 38 35* 34*  BILITOT 1.0 1.1 1.5* 1.1 0.8  PROT 6.5 6.4* 6.0* 5.9* 6.0*  ALBUMIN 3.3* 3.0* 2.9* 2.8* 2.8*   No results for input(s): LIPASE, AMYLASE in the last 168 hours. No results for input(s): AMMONIA in the last 168 hours. CBC: Recent Labs  Lab 01/24/21 0334 01/25/21 0351 01/26/21 0351 01/27/21 0340 01/28/21 0415  WBC 9.1 7.2  7.8 3.3* 8.1  NEUTROABS 8.1* 6.2 6.4 2.9 7.0  HGB 11.5* 11.1* 10.1* 9.6* 10.2*  HCT 34.7* 32.3* 30.2* 29.0* 29.6*  MCV 91.6 89.7 91.2 90.1 88.4  PLT 125* 139* 155 145* 173   Cardiac Enzymes: Recent Labs  Lab 01/24/21 0059  CKTOTAL 194   BNP: Invalid input(s): POCBNP CBG: Recent Labs  Lab 01/28/21 1232 01/28/21 1628 01/28/21 2135 01/29/21 0748 01/29/21 1144  GLUCAP 363* 323* 349* 375* 403*   D-Dimer No results for input(s):  DDIMER in the last 72 hours. Hgb A1c No results for input(s): HGBA1C in the last 72 hours. Lipid Profile No results for input(s): CHOL, HDL, LDLCALC, TRIG, CHOLHDL, LDLDIRECT in the last 72 hours. Thyroid function studies No results for input(s): TSH, T4TOTAL, T3FREE, THYROIDAB in the last 72 hours.  Invalid input(s): FREET3 Anemia work up No results for input(s): VITAMINB12, FOLATE, FERRITIN, TIBC, IRON, RETICCTPCT in the last 72 hours. Urinalysis    Component Value Date/Time   COLORURINE YELLOW 08/27/2020 1930   APPEARANCEUR CLEAR 08/27/2020 1930   LABSPEC 1.023 08/27/2020 1930   PHURINE 5.0 08/27/2020 1930   GLUCOSEU 50 (A) 08/27/2020 1930   HGBUR MODERATE (A) 08/27/2020 1930   BILIRUBINUR NEGATIVE 08/27/2020 1930   KETONESUR NEGATIVE 08/27/2020 1930   PROTEINUR >=300 (A) 08/27/2020 1930   UROBILINOGEN 0.2 12/05/2011 1655   NITRITE NEGATIVE 08/27/2020 1930   LEUKOCYTESUR NEGATIVE 08/27/2020 1930   Sepsis Labs Invalid input(s): PROCALCITONIN,  WBC,  LACTICIDVEN Microbiology Recent Results (from the past 240 hour(s))  Blood Culture (routine x 2)     Status: None   Collection Time: 01/23/21  8:39 PM   Specimen: Site Not Specified; Blood  Result Value Ref Range Status   Specimen Description   Final    SITE NOT SPECIFIED BLOOD Performed at [facility], [place] [address] [place], [place] [address]    Special Requests   Final    Blood Culture adequate volume BOTTLES DRAWN AEROBIC AND ANAEROBIC Performed at [facility] [address] [place], [place] [address]    Culture   Final    NO GROWTH 5 DAYS Performed at [facility], [place] [address], [place], [place] [address]    Report Status 01/28/2021 FINAL  Final  Blood Culture (routine x 2)     Status: None   Collection Time: 01/23/21  8:40 PM   Specimen: Site Not Specified; Blood  Result Value Ref Range Status   Specimen Description   Final    SITE NOT SPECIFIED BLOOD Performed at [facility], [place] [address] [place], [place] [address]    Special Requests   Final    BOTTLES DRAWN AEROBIC AND ANAEROBIC Blood Culture adequate volume Performed at [place] [address], [place], [place] [address]    Culture   Final    NO GROWTH 5 DAYS Performed at [facility], [place] [address], [place], [place] [address]    Report Status 01/28/2021 FINAL  Final  Resp Panel by RT-PCR (Flu A&B, Covid) Nasopharyngeal Swab     Status: Abnormal   Collection Time: 01/23/21  8:45 PM   Specimen: Nasopharyngeal Swab; Nasopharyngeal(NP) swabs in vial transport medium  Result Value Ref Range Status   SARS Coronavirus 2 by RT PCR POSITIVE (A) NEGATIVE Final    Comment: RESULT CALLED TO, READ BACK BY AND VERIFIED WITH:  Willeen Cass RN 01/23/21 @ 9924 (NOTE) SARS-CoV-2 target nucleic acids are DETECTED.  The SARS-CoV-2 RNA is generally detectable in upper respiratory specimens during the acute phase of infection. Positive results are indicative of the presence of the identified virus, but do not rule out bacterial infection or co-infection with other pathogens not detected by the test. Clinical correlation with patient history and other diagnostic information is necessary to determine patient infection status. The expected result is Negative.  Fact Sheet for Patients: EntrepreneurPulse.com.au  Fact Sheet for Healthcare Providers: IncredibleEmployment.be  This test is not yet approved or cleared by the [place] FDA and  has been authorized for detection and/or diagnosis of SARS-CoV-2 by FDA under an Emergency Use Authorization (EUA).  This EUA will remain in effect (meaning this test can b e used) for the duration of  the COVID-19 declaration under Section 564(b)(1) of the Act, 21 U.S.C. section 360bbb-3(b)(1), unless the authorization is terminated or revoked sooner.     Influenza A by PCR NEGATIVE NEGATIVE  Final   Influenza B by PCR NEGATIVE NEGATIVE Final    Comment: (NOTE) The Xpert Xpress SARS-CoV-2/FLU/RSV plus assay is intended as an aid in the diagnosis of influenza from Nasopharyngeal swab specimens and should not be used as a sole basis for treatment. Nasal washings and aspirates are unacceptable for Xpert Xpress SARS-CoV-2/FLU/RSV testing.  Fact Sheet for Patients: EntrepreneurPulse.com.au  Fact Sheet for Healthcare Providers: IncredibleEmployment.be  This test is not yet approved or cleared by the [place] FDA and has been authorized for detection and/or diagnosis of SARS-CoV-2 by FDA under an Emergency Use Authorization (EUA). This EUA will remain in effect (meaning this test can be used) for the duration of the COVID-19 declaration under Section 564(b)(1) of the Act, 21 U.S.C. section 360bbb-3(b)(1), unless the authorization is terminated or revoked.  Performed at [facility], [place] [address], [place], [place] [address]   Expectorated Sputum Assessment w Gram Stain, Rflx to Resp Cult     Status: None   Collection Time: 01/24/21  6:24 AM   Specimen: Expectorated Sputum  Result Value Ref Range Status   Specimen Description EXPECTORATED SPUTUM  Final   Special Requests NONE  Final   Sputum evaluation   Final    THIS SPECIMEN IS ACCEPTABLE FOR SPUTUM CULTURE Performed at [facility], [place] [address], [place], [place] [address]    Report Status 01/24/2021 FINAL  Final  Culture, Respiratory w Gram Stain     Status: None   Collection Time: 01/24/21  6:24 AM  Result Value Ref Range Status   Specimen Description   Final    EXPECTORATED SPUTUM Performed at [facility], [place] [address] [place], [place] [address]    Special Requests   Final    NONE Reflexed from [id number] Performed at [facility], [place] [address] [place], [place] [address]    Gram Stain    Final    ABUNDANT WBC PRESENT,BOTH PMN AND MONONUCLEAR FEW GRAM NEGATIVE RODS FEW GRAM POSITIVE COCCI RARE GRAM VARIABLE ROD    Culture   Final    MODERATE Normal respiratory flora-no Staph aureus or Pseudomonas seen Performed at [facility], [address] [place], [place] [address]    Report Status 01/26/2021 FINAL  Final     Time coordinating discharge: Over 30 minutes  SIGNED:   Little Ishikawa, DO [facility] 01/29/2021, 1:05 PM Pager   If 7PM-7AM, please contact night-coverage [email or web address]

## 2021-02-01 ENCOUNTER — Telehealth: Payer: Self-pay | Admitting: Physician Assistant

## 2021-02-01 NOTE — Telephone Encounter (Signed)
Inbound call from pt requesting a call back stating that he has severe diarrhea and would like to know what he can take. Please advise. Thank you.

## 2021-02-01 NOTE — Telephone Encounter (Signed)
Pt returned call. He states that he was recently diagnosed with Covid and double pneumonia. Pt reports that his stools haven't been the same since he did the bowel purge. Pt states that the purge did help and he used Miralax BID for 2 days. Pt states that he was told to discontinue Miralax, I agreed. Pt has not tried anything OTC, advised patient to try Imodium OTC and take 2 tablets if he is having severe diarrhea. Pt will try Imodium for a few days, he has a follow up with Ellouise Newer, PA-C on Friday, 02/10/21. Pt verbalized understanding and had no concerns at the end of the call.

## 2021-02-01 NOTE — Telephone Encounter (Signed)
Lm on vm for patient to return call 

## 2021-02-10 ENCOUNTER — Ambulatory Visit (INDEPENDENT_AMBULATORY_CARE_PROVIDER_SITE_OTHER): Payer: Medicare Other | Admitting: Physician Assistant

## 2021-02-10 ENCOUNTER — Encounter: Payer: Self-pay | Admitting: Physician Assistant

## 2021-02-10 VITALS — BP 124/66 | HR 67 | Ht 71.0 in | Wt 193.0 lb

## 2021-02-10 DIAGNOSIS — K3184 Gastroparesis: Secondary | ICD-10-CM

## 2021-02-10 DIAGNOSIS — R197 Diarrhea, unspecified: Secondary | ICD-10-CM

## 2021-02-10 NOTE — Progress Notes (Signed)
Chief Complaint: Follow-up gastroparesis  HPI:    Jesse Lane is a 65 year old Caucasian male with a past medical history as listed below including diabetes, stroke 10/16/2019, with LVEF 65-70%, stage IV malignant melanoma with lymph node metastasis and gastroparesis, known to Dr. Ardis Hughs, who returns to clinic today for follow-up of his gastroparesis     08/27/2019 CT the chest abdomen pelvis with contrast which showed no evidence of recurrent metastatic disease in the chest, abdomen or pelvis.  Stomach and bowel were normal.    10/16/2019-10/18/2019 patient admitted to hospital for encephalopathy due to AKI and hypertension as well as strokelike symptoms treated with IV TPA.  He was started on aspirin at discharge.  Apparently at that time discussed a history of gastric ulcers, patient was on Carafate, Prevacid and Prilosec at some point.  It was recommended he follow-up with [place].    10/17/2019 hemoglobin A1c 6.4.    12/09/2019 patient seen in clinic by me for epigastric discomfort and nausea with a history of gastric ulcers.  At that time patient discussed that he had not had a stroke recently and just presented as one after kidney failure from IV contrast.  At that time was cancer free for 2 years but continued with some nausea and episodes of vomiting.  He was scheduled for an EGD and started on pantoprazole 40 twice daily.  Also continued sucralfate.    02/15/2020 colonoscopy with diverticulosis in the entire colon, 5 2-6 mm polyps in sigmoid, transverse and descending colon and otherwise normal.  Pathology showed adenomatous polyps and repeat was recommended in 5 years.    02/15/2020 EGD with short segment, nonnodular Barrett's change classified as Barrett's stage C1-M2, gastritis and otherwise normal.  Repeat was recommended in 3 years.    06/07/2020 patient seen in clinic and described for the past couple of years he would have episodes about a week out of the month.  Have no energy no appetite and feels  very full very fast and bloated.  Ordered a gastric emptying study for further evaluation continue pantoprazole and sucralfate.  Explained that if the gastric emptying study was normal then could consider SIBO testing.    07/03/2020 gastric emptying study showed delayed gastric emptying.  He was started on Reglan 5 mg twice daily 20 to 30 minutes before breakfast and then again before dinner.    07/26/2020 patient seen in clinic and described that he had not had any severe episodes of his gastroparesis in the past past month since being on Reglan 5 mg twice daily.  He had not been needing his Carafate or pantoprazole on a regular basis.  At that time patient wanted to try a small increase in frequency of medicine to see if it helped with bloating.  Increased Reglan to 5 mg 3 times daily.    08/27/2020-08/29/2020 patient hospitalized after presenting with intractable nausea and vomiting.  At time of discharge his Reglan was increased to 10 mg 4 times daily.  CT of the abdomen pelvis without contrast during admission showed no renal or ureteral stones, aortic atherosclerosis, sigmoid diverticulosis and no acute findings in the abdomen or pelvis.    09/21/2020 patient was following up and was completely better.  At that time was taking his Metoclopramide 10 mg 3 times daily.  He requested some Zofran to have on hand.  At that time continued his Metoclopramide 10 mg 3 times daily and sent in a prescription for Zofran.    11/28/2020 patient called in  and described feeling shaky and wanted to know if it was due to his metoclopramide.  At that time recommended he discontinue Reglan to see if this is causing his shaking.    12/29/2020 patient called to discuss some diarrhea.  Abdominal x-ray was ordered to check for overflow constipation.    01/03/2021 x-ray showed large amount of stool throughout the colon consistent with constipation.  At that time recommended that he did about MiraLAX bowel purge and then twice daily  MiraLAX to keep things moving.    02/01/2021 patient called in describing severe diarrhea.  At that time had recently been diagnosed with COVID and double pneumonia.  He reported his stools have been the same since he did the bowel purge.  Initially the purge did help.  He was advised to try over-the-counter Imodium.    Today, the patient tells me that in general he is feeling pretty good especially over the past 3 days since he took 1 Imodium and he has not had any further diarrhea.  He has had a few bowel movements but they are "slightly more formed".  Tells me that when he was in the hospital with his double pneumonia and COVID recently they never did any stool studies or did anything.  He was having stools more than 10 times a day.  Again this has seemed to stop over the past 3 days.    Explains that over the past 3 to 4 days he has had no further hand tremors either.  Still believes this is from his Reglan which we stopped back in August.  Tells me that as far as his gastroparesis symptoms he will still feel "full up" at times especially after certain foods.  Relays an instance of eating a steak and cheese sandwich the other day and feeling full 4 days.  Tells me these are symptoms he can live with though.  Does ask if there are alternatives to Reglan.    He is slowly regaining his strength after having COVID.    Denies fever, chills, blood in his stool or symptoms that awaken him from sleep.  Past Medical History:  Diagnosis Date   Arthritis    Diabetes mellitus type 2, uncontrolled    Diabetic neuropathy (HCC)    GERD (gastroesophageal reflux disease)    History of stomach ulcers    Hypertension    Melanoma ([place])    mets to lymph node and intestines    Past Surgical History:  Procedure Laterality Date   ABCESS DRAINAGE     gluteal area   COLONOSCOPY     INGUINAL HERNIA REPAIR N/A 08/15/2015   Procedure: HERNIA REPAIR INGUINAL INCARCERATED;  Surgeon: Alphonsa Overall, MD;  Location: [facility];   Service: General;  Laterality: N/A;  with MESH   pyloric stenosis     TONSILLECTOMY      Current Outpatient Medications  Medication Sig Dispense Refill   amLODipine (NORVASC) 10 MG tablet Take 10 mg by mouth daily.     Continuous Blood Gluc Receiver ([place]) DEVI by Does not apply route.     Continuous Blood Gluc Sensor (DEXCOM G6 SENSOR) MISC by Does not apply route.     Continuous Blood Gluc Transmit (DEXCOM G6 TRANSMITTER) MISC by Does not apply route.     diphenhydramine-acetaminophen (TYLENOL PM) 25-500 MG TABS tablet Take 2 tablets by mouth at bedtime as needed (sleep).     hydrochlorothiazide (HYDRODIURIL) 25 MG tablet Take 25 mg by mouth at bedtime.  insulin degludec (TRESIBA FLEXTOUCH) 100 UNIT/ML FlexTouch Pen Inject 24 Units into the skin daily before breakfast.     insulin lispro (HUMALOG) 100 UNIT/ML KwikPen Inject 0-20 Units into the skin 3 (three) times daily. Per sliding scale     Insulin Pen Needle (BD PEN NEEDLE MICRO U/F) 32G X 6 MM MISC 1 each by Other route See admin instructions. Use one pen needle to inject insulin 4 times daily. **must have appointment for additional refills.** 150 each 0   Insulin Pen Needle (BD PEN NEEDLE MICRO U/F) 32G X 6 MM MISC USE 1 PEN NEEDLE SIX TIMES DAILY (Patient taking differently: 1 each by Other route See admin instructions. USE 1 PEN NEEDLE SIX TIMES DAILY) 200 each 2   lisinopril (PRINIVIL,ZESTRIL) 40 MG tablet Take 40 mg by mouth at bedtime.   0   montelukast (SINGULAIR) 10 MG tablet Take 10 mg by mouth at bedtime.     Multiple Vitamin (MULTIVITAMIN WITH MINERALS) TABS tablet Take 1 tablet by mouth daily.     Nebivolol HCl (BYSTOLIC) 20 MG TABS Take 20 mg by mouth at bedtime.     ondansetron (ZOFRAN) 4 MG tablet Take 1 tablet (4 mg total) by mouth every 6 (six) hours as needed for nausea. 30 tablet 5   pantoprazole (PROTONIX) 40 MG tablet Take 1 tablet by mouth twice daily (Patient taking differently: Take 40 mg by mouth  daily.) 60 tablet 5   pravastatin (PRAVACHOL) 20 MG tablet Take 20 mg by mouth daily.     predniSONE (DELTASONE) 10 MG tablet Take 4 tablets (40 mg total) by mouth daily for 3 days, THEN 3 tablets (30 mg total) daily for 3 days, THEN 2 tablets (20 mg total) daily for 3 days, THEN 1 tablet (10 mg total) daily for 3 days. 30 tablet 0   pregabalin (LYRICA) 100 MG capsule Take 100-300 mg by mouth See admin instructions. Take 2 capsule (200 mg) by mouth every morning and take three capsules (300 mg) at bedtime     Probiotic Product (PROBIOTIC PO) Take 1 tablet by mouth daily. gummies     traZODone (DESYREL) 100 MG tablet Take 100 mg by mouth at bedtime.  0   No current facility-administered medications for this visit.    Allergies as of 02/10/2021 - Review Complete 01/24/2021  Allergen Reaction Noted   Contrast media [iodinated diagnostic agents] Itching 10/12/2019    Family History  Problem Relation Age of Onset   Breast cancer Mother    Bone cancer Mother    Melanoma Mother    Prostate cancer Father    Pancreatic cancer Maternal Grandfather    Diabetes Paternal Aunt    Colon cancer Neg Hx    Colon polyps Neg Hx    Esophageal cancer Neg Hx    Stomach cancer Neg Hx     Social History   Socioeconomic History   Marital status: Single    Spouse name: Not on file   Number of children: 1   Years of education: Not on file   Highest education level: Not on file  Occupational History   Occupation: retired  Tobacco Use   Smoking status: Former    Packs/day: 1.00    Years: 5.00    Pack years: 5.00    Types: Cigarettes   Smokeless tobacco: Never  Vaping Use   Vaping Use: Never used  Substance and Sexual Activity   Alcohol use: Yes    Alcohol/week: 2.0 standard drinks  Types: 2 Glasses of wine per week    Comment: occasionally   Drug use: Not Currently    Types: Marijuana    Comment: daily   Sexual activity: Never  Other Topics Concern   Not on file  Social History  Narrative   Not on file   Social Determinants of Health   Financial Resource Strain: Not on file  Food Insecurity: Not on file  Transportation Needs: Not on file  Physical Activity: Not on file  Stress: Not on file  Social Connections: Not on file  Intimate Partner Violence: Not on file    Review of Systems:    Constitutional: No weight loss, fever or chills Cardiovascular: No chest pain Respiratory: No SOB  Gastrointestinal: See HPI and otherwise negative   Physical Exam:  Vital signs: BP 124/66   Pulse 67   Ht 5\' 11"  (1.803 m)   Wt 193 lb (87.5 kg)   BMI 26.92 kg/m    Constitutional:   Very Pleasant Caucasian male appears to be in NAD, Well developed, Well nourished, alert and cooperative Respiratory: Respirations even and unlabored. Lungs clear to auscultation bilaterally.   No wheezes, crackles, or rhonchi.  Cardiovascular: Normal S1, S2. No MRG. Regular rate and rhythm. No peripheral edema, cyanosis or pallor.  Gastrointestinal:  Soft, nondistended, nontender. No rebound or guarding. Normal bowel sounds. No appreciable masses or hepatomegaly. Rectal:  Not performed.  Psychiatric:  Demonstrates good judgement and reason without abnormal affect or behaviors.  RELEVANT LABS AND IMAGING: CBC    Component Value Date/Time   WBC 8.1 01/28/2021 0415   RBC 3.35 (L) 01/28/2021 0415   HGB 10.2 (L) 01/28/2021 0415   HCT 29.6 (L) 01/28/2021 0415   PLT 173 01/28/2021 0415   MCV 88.4 01/28/2021 0415   MCH 30.4 01/28/2021 0415   MCHC 34.5 01/28/2021 0415   RDW 13.6 01/28/2021 0415   LYMPHSABS 0.5 (L) 01/28/2021 0415   MONOABS 0.4 01/28/2021 0415   EOSABS 0.0 01/28/2021 0415   BASOSABS 0.0 01/28/2021 0415    CMP     Component Value Date/Time   NA 131 (L) 01/28/2021 0415   K 3.6 01/28/2021 0415   CL 100 01/28/2021 0415   CO2 22 01/28/2021 0415   GLUCOSE 354 (H) 01/28/2021 0415   BUN 54 (H) 01/28/2021 0415   CREATININE 1.50 (H) 01/28/2021 0415   CALCIUM 7.8 (L)  01/28/2021 0415   PROT 6.0 (L) 01/28/2021 0415   ALBUMIN 2.8 (L) 01/28/2021 0415   AST 15 01/28/2021 0415   ALT 20 01/28/2021 0415   ALKPHOS 34 (L) 01/28/2021 0415   BILITOT 0.8 01/28/2021 0415   GFRNONAA 51 (L) 01/28/2021 0415   GFRAA >60 10/18/2019 0339    Assessment: 1.  Gastroparesis: Symptoms seem to be fairly under control at the moment unless he eats the wrong thing, previously Reglan gave him hand tremors so we stopped it 2.  Diarrhea: Better after using 1 Imodium, previously greater than 10 stools a day; consider postinfectious IBS with recent COVID infection versus infectious cause versus other  Plan: 1.  Discussed with patient that some of his diarrhea could have been worsened from [place].  He is feeling better after using his Imodium.  He has not taken any in 3 days and has had more formed stools once or twice a day since then.  Discussed that if diarrhea comes back would recommend that he have stool studies including a GI pathogen panel and C. difficile.  He can  continue to use Imodium as needed.  Discussed possibility of post-COVID IBS 2.  Discussed gastroparesis.  Patient seems to be doing fairly well without Reglan which gave him tremors.  We discussed that there are not any other great medicines for this.  We could use Erythromycin for short periods of time if he has severe symptoms.  Also discussed Domperidone which is only available through [place].  At this time revisited gastroparesis diet recommendations and gave him handout.  Discussed that if he abides by these he will likely do fairly well. 3.  Patient will call if he has any concerns in the near future.  He will follow-up as needed.  Ellouise Newer, PA-C [facility] 02/10/2021, 2:42 PM  Cc: [name], MD

## 2021-02-10 NOTE — Patient Instructions (Signed)
Gastroparesis diet handout provided.   If you are age 66 or older, your body mass index should be between 23-30. Your Body mass index is 26.92 kg/m. If this is out of the aforementioned range listed, please consider follow up with your Primary Care Provider.  If you are age 8 or younger, your body mass index should be between 19-25. Your Body mass index is 26.92 kg/m. If this is out of the aformentioned range listed, please consider follow up with your Primary Care Provider.   ________________________________________________________  The [facility] providers would like to encourage you to use [facility] to communicate with providers for non-urgent requests or questions.  Due to long hold times on the telephone, sending your provider a message by [facility] may be a faster and more efficient way to get a response.  Please allow 48 business hours for a response.  Please remember that this is for non-urgent requests.  _______________________________________________________

## 2021-02-11 NOTE — Progress Notes (Signed)
I agree with the above note, plan 

## 2021-03-08 ENCOUNTER — Inpatient Hospital Stay (HOSPITAL_COMMUNITY)
Admission: EM | Admit: 2021-03-08 | Discharge: 2021-03-10 | DRG: 291 | Disposition: A | Discharge status: Home / Self Care | Payer: Medicare Other | Attending: Family Medicine | Admitting: Family Medicine

## 2021-03-08 ENCOUNTER — Emergency Department (HOSPITAL_COMMUNITY): Payer: Medicare Other

## 2021-03-08 ENCOUNTER — Other Ambulatory Visit: Payer: Self-pay

## 2021-03-08 ENCOUNTER — Encounter (HOSPITAL_COMMUNITY): Payer: Self-pay | Admitting: Emergency Medicine

## 2021-03-08 DIAGNOSIS — I440 Atrioventricular block, first degree: Secondary | ICD-10-CM | POA: Diagnosis present

## 2021-03-08 DIAGNOSIS — J9601 Acute respiratory failure with hypoxia: Secondary | ICD-10-CM | POA: Diagnosis present

## 2021-03-08 DIAGNOSIS — I130 Hypertensive heart and chronic kidney disease with heart failure and stage 1 through stage 4 chronic kidney disease, or unspecified chronic kidney disease: Secondary | ICD-10-CM | POA: Diagnosis present

## 2021-03-08 DIAGNOSIS — E119 Type 2 diabetes mellitus without complications: Secondary | ICD-10-CM | POA: Diagnosis not present

## 2021-03-08 DIAGNOSIS — Z8711 Personal history of peptic ulcer disease: Secondary | ICD-10-CM | POA: Diagnosis not present

## 2021-03-08 DIAGNOSIS — Z803 Family history of malignant neoplasm of breast: Secondary | ICD-10-CM | POA: Diagnosis not present

## 2021-03-08 DIAGNOSIS — E1140 Type 2 diabetes mellitus with diabetic neuropathy, unspecified: Secondary | ICD-10-CM | POA: Diagnosis present

## 2021-03-08 DIAGNOSIS — C779 Secondary and unspecified malignant neoplasm of lymph node, unspecified: Secondary | ICD-10-CM | POA: Diagnosis present

## 2021-03-08 DIAGNOSIS — Z87891 Personal history of nicotine dependence: Secondary | ICD-10-CM | POA: Diagnosis not present

## 2021-03-08 DIAGNOSIS — N182 Chronic kidney disease, stage 2 (mild): Secondary | ICD-10-CM | POA: Diagnosis present

## 2021-03-08 DIAGNOSIS — Z800 Family history of malignant neoplasm of digestive organs: Secondary | ICD-10-CM

## 2021-03-08 DIAGNOSIS — M1990 Unspecified osteoarthritis, unspecified site: Secondary | ICD-10-CM | POA: Diagnosis present

## 2021-03-08 DIAGNOSIS — I2699 Other pulmonary embolism without acute cor pulmonale: Secondary | ICD-10-CM

## 2021-03-08 DIAGNOSIS — Z91041 Radiographic dye allergy status: Secondary | ICD-10-CM

## 2021-03-08 DIAGNOSIS — Z833 Family history of diabetes mellitus: Secondary | ICD-10-CM | POA: Diagnosis not present

## 2021-03-08 DIAGNOSIS — R0902 Hypoxemia: Secondary | ICD-10-CM

## 2021-03-08 DIAGNOSIS — Z8042 Family history of malignant neoplasm of prostate: Secondary | ICD-10-CM

## 2021-03-08 DIAGNOSIS — R609 Edema, unspecified: Secondary | ICD-10-CM | POA: Diagnosis not present

## 2021-03-08 DIAGNOSIS — E1122 Type 2 diabetes mellitus with diabetic chronic kidney disease: Secondary | ICD-10-CM | POA: Diagnosis present

## 2021-03-08 DIAGNOSIS — Z808 Family history of malignant neoplasm of other organs or systems: Secondary | ICD-10-CM

## 2021-03-08 DIAGNOSIS — Z794 Long term (current) use of insulin: Secondary | ICD-10-CM

## 2021-03-08 DIAGNOSIS — Z8616 Personal history of COVID-19: Secondary | ICD-10-CM

## 2021-03-08 DIAGNOSIS — Z79899 Other long term (current) drug therapy: Secondary | ICD-10-CM | POA: Diagnosis not present

## 2021-03-08 DIAGNOSIS — K219 Gastro-esophageal reflux disease without esophagitis: Secondary | ICD-10-CM

## 2021-03-08 DIAGNOSIS — I5033 Acute on chronic diastolic (congestive) heart failure: Secondary | ICD-10-CM | POA: Diagnosis present

## 2021-03-08 DIAGNOSIS — Z20822 Contact with and (suspected) exposure to covid-19: Secondary | ICD-10-CM | POA: Diagnosis present

## 2021-03-08 DIAGNOSIS — Z85820 Personal history of malignant melanoma of skin: Secondary | ICD-10-CM

## 2021-03-08 DIAGNOSIS — I10 Essential (primary) hypertension: Secondary | ICD-10-CM | POA: Diagnosis not present

## 2021-03-08 DIAGNOSIS — N1832 Chronic kidney disease, stage 3b: Secondary | ICD-10-CM | POA: Diagnosis present

## 2021-03-08 DIAGNOSIS — I509 Heart failure, unspecified: Secondary | ICD-10-CM

## 2021-03-08 LAB — RESP PANEL BY RT-PCR (FLU A&B, COVID) ARPGX2
Influenza A by PCR: NEGATIVE
Influenza B by PCR: NEGATIVE
SARS Coronavirus 2 by RT PCR: NEGATIVE

## 2021-03-08 LAB — CBC WITH DIFFERENTIAL/PLATELET
Abs Immature Granulocytes: 0.01 10*3/uL (ref 0.00–0.07)
Basophils Absolute: 0 10*3/uL (ref 0.0–0.1)
Basophils Relative: 0 %
Eosinophils Absolute: 0 10*3/uL (ref 0.0–0.5)
Eosinophils Relative: 0 %
HCT: 33 % — ABNORMAL LOW (ref 39.0–52.0)
Hemoglobin: 10.7 g/dL — ABNORMAL LOW (ref 13.0–17.0)
Immature Granulocytes: 0 %
Lymphocytes Relative: 29 %
Lymphs Abs: 1.5 10*3/uL (ref 0.7–4.0)
MCH: 29.8 pg (ref 26.0–34.0)
MCHC: 32.4 g/dL (ref 30.0–36.0)
MCV: 91.9 fL (ref 80.0–100.0)
Monocytes Absolute: 0.3 10*3/uL (ref 0.1–1.0)
Monocytes Relative: 6 %
Neutro Abs: 3.5 10*3/uL (ref 1.7–7.7)
Neutrophils Relative %: 65 %
Platelets: 207 10*3/uL (ref 150–400)
RBC: 3.59 MIL/uL — ABNORMAL LOW (ref 4.22–5.81)
RDW: 15.1 % (ref 11.5–15.5)
WBC: 5.4 10*3/uL (ref 4.0–10.5)
nRBC: 0 % (ref 0.0–0.2)

## 2021-03-08 LAB — BLOOD GAS, VENOUS
Acid-Base Excess: 0.9 mmol/L (ref 0.0–2.0)
Bicarbonate: 25.4 mmol/L (ref 20.0–28.0)
O2 Saturation: 78.7 %
Patient temperature: 98.6
pCO2, Ven: 42.7 mmHg — ABNORMAL LOW (ref 44.0–60.0)
pH, Ven: 7.392 (ref 7.250–7.430)
pO2, Ven: 47.7 mmHg — ABNORMAL HIGH (ref 32.0–45.0)

## 2021-03-08 LAB — COMPREHENSIVE METABOLIC PANEL
ALT: 12 U/L (ref 0–44)
AST: 15 U/L (ref 15–41)
Albumin: 3.8 g/dL (ref 3.5–5.0)
Alkaline Phosphatase: 56 U/L (ref 38–126)
Anion gap: 6 (ref 5–15)
BUN: 18 mg/dL (ref 8–23)
CO2: 26 mmol/L (ref 22–32)
Calcium: 8.4 mg/dL — ABNORMAL LOW (ref 8.9–10.3)
Chloride: 108 mmol/L (ref 98–111)
Creatinine, Ser: 1.3 mg/dL — ABNORMAL HIGH (ref 0.61–1.24)
GFR, Estimated: >60 mL/min (ref >60)
Glucose, Bld: 121 mg/dL — ABNORMAL HIGH (ref 70–99)
Potassium: 3.7 mmol/L (ref 3.5–5.1)
Sodium: 140 mmol/L (ref 135–145)
Total Bilirubin: 1.2 mg/dL (ref 0.3–1.2)
Total Protein: 7.1 g/dL (ref 6.5–8.1)

## 2021-03-08 LAB — CBG MONITORING, ED: Glucose-Capillary: 116 mg/dL — ABNORMAL HIGH (ref 70–99)

## 2021-03-08 LAB — TROPONIN I (HIGH SENSITIVITY): Troponin I (High Sensitivity): 13 ng/L (ref <18)

## 2021-03-08 LAB — BRAIN NATRIURETIC PEPTIDE: B Natriuretic Peptide: 683.9 pg/mL — ABNORMAL HIGH (ref 0.0–100.0)

## 2021-03-08 MED ORDER — MONTELUKAST SODIUM 10 MG PO TABS
10.0000 mg | ORAL_TABLET | Freq: Every day | ORAL | Status: DC
  Administered 2021-03-09 (×2): 10 mg via ORAL
  Filled 2021-03-08 (×2): qty 1

## 2021-03-08 MED ORDER — ALBUTEROL SULFATE HFA 108 (90 BASE) MCG/ACT IN AERS
2.0000 | INHALATION_SPRAY | Freq: Once | RESPIRATORY_TRACT | Status: AC
  Administered 2021-03-08: 2 via RESPIRATORY_TRACT
  Filled 2021-03-08: qty 6.7

## 2021-03-08 MED ORDER — TRAZODONE HCL 100 MG PO TABS
100.0000 mg | ORAL_TABLET | Freq: Every day | ORAL | Status: DC
  Administered 2021-03-09 (×2): 100 mg via ORAL
  Filled 2021-03-08 (×2): qty 1

## 2021-03-08 MED ORDER — HYDRALAZINE HCL 20 MG/ML IJ SOLN
10.0000 mg | Freq: Once | INTRAMUSCULAR | Status: AC
  Administered 2021-03-08: 10 mg via INTRAVENOUS
  Filled 2021-03-08: qty 1

## 2021-03-08 MED ORDER — INSULIN GLARGINE-YFGN 100 UNIT/ML ~~LOC~~ SOLN
10.0000 [IU] | Freq: Every day | SUBCUTANEOUS | Status: DC
  Administered 2021-03-09: 10 [IU] via SUBCUTANEOUS
  Filled 2021-03-08: qty 0.1

## 2021-03-08 MED ORDER — ONDANSETRON HCL 4 MG/2ML IJ SOLN
4.0000 mg | Freq: Four times a day (QID) | INTRAMUSCULAR | Status: DC | PRN
  Administered 2021-03-09: 4 mg via INTRAVENOUS
  Filled 2021-03-08: qty 2

## 2021-03-08 MED ORDER — SODIUM CHLORIDE 0.9 % IV SOLN: 250.0000 mL | INTRAVENOUS | Status: DC | PRN

## 2021-03-08 MED ORDER — LISINOPRIL 10 MG PO TABS
40.0000 mg | ORAL_TABLET | Freq: Every day | ORAL | Status: DC
  Filled 2021-03-08: qty 4

## 2021-03-08 MED ORDER — MELATONIN 5 MG PO TABS
5.0000 mg | ORAL_TABLET | Freq: Every day | ORAL | Status: DC
  Administered 2021-03-09 (×2): 5 mg via ORAL
  Filled 2021-03-08 (×2): qty 1

## 2021-03-08 MED ORDER — ENOXAPARIN SODIUM 40 MG/0.4ML IJ SOSY
40.0000 mg | PREFILLED_SYRINGE | INTRAMUSCULAR | Status: DC
  Administered 2021-03-09 – 2021-03-10 (×2): 40 mg via SUBCUTANEOUS
  Filled 2021-03-08 (×2): qty 0.4

## 2021-03-08 MED ORDER — FUROSEMIDE 10 MG/ML IJ SOLN
40.0000 mg | Freq: Two times a day (BID) | INTRAMUSCULAR | Status: DC
  Administered 2021-03-09: 40 mg via INTRAVENOUS
  Filled 2021-03-08: qty 4

## 2021-03-08 MED ORDER — FUROSEMIDE 10 MG/ML IJ SOLN
40.0000 mg | Freq: Once | INTRAMUSCULAR | Status: AC
  Administered 2021-03-08: 40 mg via INTRAVENOUS
  Filled 2021-03-08: qty 4

## 2021-03-08 MED ORDER — AMLODIPINE BESYLATE 10 MG PO TABS
10.0000 mg | ORAL_TABLET | Freq: Every day | ORAL | Status: DC
  Administered 2021-03-09 – 2021-03-10 (×2): 10 mg via ORAL
  Filled 2021-03-08: qty 1
  Filled 2021-03-08: qty 2

## 2021-03-08 MED ORDER — NEBIVOLOL HCL 10 MG PO TABS
20.0000 mg | ORAL_TABLET | Freq: Every day | ORAL | Status: DC
  Administered 2021-03-09 (×2): 20 mg via ORAL
  Filled 2021-03-08 (×3): qty 2

## 2021-03-08 MED ORDER — METHYLPREDNISOLONE SODIUM SUCC 125 MG IJ SOLR
125.0000 mg | Freq: Once | INTRAMUSCULAR | Status: AC
  Administered 2021-03-08: 125 mg via INTRAVENOUS
  Filled 2021-03-08: qty 2

## 2021-03-08 MED ORDER — PREGABALIN 100 MG PO CAPS
300.0000 mg | ORAL_CAPSULE | Freq: Every day | ORAL | Status: DC
  Administered 2021-03-09 (×2): 300 mg via ORAL
  Filled 2021-03-08 (×2): qty 6
  Filled 2021-03-08: qty 3

## 2021-03-08 MED ORDER — PANTOPRAZOLE SODIUM 40 MG PO TBEC
40.0000 mg | DELAYED_RELEASE_TABLET | Freq: Every day | ORAL | Status: DC
  Administered 2021-03-09 – 2021-03-10 (×2): 40 mg via ORAL
  Filled 2021-03-08 (×2): qty 1

## 2021-03-08 MED ORDER — ACETAMINOPHEN 325 MG PO TABS
650.0000 mg | ORAL_TABLET | ORAL | Status: DC | PRN
  Administered 2021-03-09: 650 mg via ORAL
  Filled 2021-03-08: qty 2

## 2021-03-08 MED ORDER — SODIUM CHLORIDE 0.9% FLUSH: 3.0000 mL | INTRAVENOUS | Status: DC | PRN

## 2021-03-08 MED ORDER — SODIUM CHLORIDE 0.9% FLUSH
3.0000 mL | Freq: Two times a day (BID) | INTRAVENOUS | Status: DC
  Administered 2021-03-09 – 2021-03-10 (×4): 3 mL via INTRAVENOUS

## 2021-03-08 MED ORDER — PREGABALIN 100 MG PO CAPS
200.0000 mg | ORAL_CAPSULE | Freq: Every day | ORAL | Status: DC
  Administered 2021-03-09 – 2021-03-10 (×2): 200 mg via ORAL
  Filled 2021-03-08: qty 2
  Filled 2021-03-08: qty 4

## 2021-03-08 MED ORDER — INSULIN ASPART 100 UNIT/ML IJ SOLN
0.0000 [IU] | Freq: Three times a day (TID) | INTRAMUSCULAR | Status: DC
  Administered 2021-03-09: 3 [IU] via SUBCUTANEOUS
  Administered 2021-03-09: 7 [IU] via SUBCUTANEOUS
  Filled 2021-03-08: qty 0.09

## 2021-03-08 MED ORDER — INSULIN ASPART 100 UNIT/ML IJ SOLN
0.0000 [IU] | Freq: Every day | INTRAMUSCULAR | Status: DC
Start: 2021-03-08 — End: 2021-03-09
  Administered 2021-03-09: 3 [IU] via SUBCUTANEOUS
  Filled 2021-03-08: qty 0.05

## 2021-03-08 MED ORDER — PRAVASTATIN SODIUM 20 MG PO TABS
20.0000 mg | ORAL_TABLET | Freq: Every day | ORAL | Status: DC
  Administered 2021-03-09 – 2021-03-10 (×2): 20 mg via ORAL
  Filled 2021-03-08 (×2): qty 1

## 2021-03-08 NOTE — ED Notes (Signed)
Hospital bed delivered

## 2021-03-08 NOTE — ED Provider Notes (Signed)
[facility] DEPT Provider Note   CSN: [id number] Arrival date & time: 03/08/21  1750     History Chief Complaint  Patient presents with   Shortness of Breath   Weakness    Jesse Lane is a 65 y.o. male hx of DM, GERD, HTN, here presenting with shortness of breath.  Patient had COVID infection about 6 weeks ago.  Patient was hospitalized at that time.  Patient was not sent home with any oxygen.  Patient states that for the last several days, patient has been having worsening shortness of breath.  He was noted to be hypoxic 82% in triage.  Patient denies any fevers.  He states that his legs are swollen.  The history is provided by the patient.      Past Medical History:  Diagnosis Date   Arthritis    Diabetes mellitus type 2, uncontrolled    Diabetic neuropathy (HCC)    GERD (gastroesophageal reflux disease)    History of stomach ulcers    Hypertension    Melanoma ([place])    mets to lymph node and intestines    Patient Active Problem List   Diagnosis Date Noted   Acute on chronic diastolic CHF (congestive heart failure) ([place]) 03/08/2021   Hypokalemia 01/24/2021   Acute respiratory failure due to COVID-19 ([place]) 01/24/2021   Pneumonia due to COVID-19 virus 01/23/2021   AKI (acute kidney injury) ([place]) 08/27/2020   Hypertensive urgency 08/27/2020   Gastroparesis due to secondary diabetes ([place]) 08/27/2020   Stroke ([place]) 10/16/2019   Multinodular goiter 12/27/2016   Diabetes ([place]) 11/26/2016   Protein-calorie malnutrition, severe 09/17/2016   DKA (diabetic ketoacidoses) 09/17/2016   Diabetic ketoacidosis without coma associated with type 2 diabetes mellitus ([place])    Fecal impaction in rectum ([place])    Hyperglycemia 09/16/2016   Hyperglycemic hyperosmolar nonketotic coma ([place]) 09/16/2016   High anion gap metabolic acidosis 40/98/1191   Lactic acidosis 09/16/2016   Anemia of chronic disease 09/16/2016   Diabetic neuropathy ([place]) 09/06/2016    Essential hypertension 09/06/2016   Malignant melanoma of axilla ([place]) 08/12/2016    Past Surgical History:  Procedure Laterality Date   ABCESS DRAINAGE     gluteal area   COLONOSCOPY     INGUINAL HERNIA REPAIR N/A 08/15/2015   Procedure: HERNIA REPAIR INGUINAL INCARCERATED;  Surgeon: Alphonsa Overall, MD;  Location: [facility] OR[facility];  Service: General;  Laterality: N/A;  with MESH   pyloric stenosis     TONSILLECTOMY         Family History  Problem Relation Age of Onset   Breast cancer Mother    Bone cancer Mother    Melanoma Mother    Prostate cancer Father    Pancreatic cancer Maternal Grandfather    Diabetes Paternal Aunt    Colon cancer Neg Hx    Colon polyps Neg Hx    Esophageal cancer Neg Hx    Stomach cancer Neg Hx     Social History   Tobacco Use   Smoking status: Former    Packs/day: 1.00    Years: 5.00    Pack years: 5.00    Types: Cigarettes   Smokeless tobacco: Never  Vaping Use   Vaping Use: Never used  Substance Use Topics   Alcohol use: Yes    Alcohol/week: 2.0 standard drinks    Types: 2 Glasses of wine per week    Comment: occasionally   Drug use: Not Currently    Types: Marijuana  Comment: daily    Home Medications Prior to Admission medications   Medication Sig Start Date End Date Taking? Authorizing Provider  amLODipine (NORVASC) 10 MG tablet Take 10 mg by mouth daily. 11/28/19   [provider]  Continuous Blood Gluc Receiver ([place]) [place] by Does not apply route.    [provider]  Continuous Blood Gluc Sensor (DEXCOM G6 SENSOR) MISC by Does not apply route.    [provider]  Continuous Blood Gluc Transmit (DEXCOM G6 TRANSMITTER) MISC by Does not apply route.    [provider]  diphenhydramine-acetaminophen (TYLENOL PM) 25-500 MG TABS tablet Take 2 tablets by mouth at bedtime as needed (sleep).    [provider]  hydrochlorothiazide (HYDRODIURIL) 25 MG tablet Take 25 mg by mouth at  bedtime.    [provider]  insulin degludec (TRESIBA FLEXTOUCH) 100 UNIT/ML FlexTouch Pen Inject 24 Units into the skin daily before breakfast.    [provider]  insulin lispro (HUMALOG) 100 UNIT/ML KwikPen Inject 0-20 Units into the skin 3 (three) times daily. Per sliding scale    [provider]  Insulin Pen Needle (BD PEN NEEDLE MICRO U/F) 32G X 6 MM MISC 1 each by Other route See admin instructions. Use one pen needle to inject insulin 4 times daily. **must have appointment for additional refills.** 06/30/18   Renato Shin, MD  Insulin Pen Needle (BD PEN NEEDLE MICRO U/F) 32G X 6 MM MISC USE 1 PEN NEEDLE SIX TIMES DAILY Patient taking differently: 1 each by Other route See admin instructions. USE 1 PEN NEEDLE SIX TIMES DAILY 07/02/18   Renato Shin, MD  lisinopril (PRINIVIL,ZESTRIL) 40 MG tablet Take 40 mg by mouth at bedtime.  07/24/16   [provider]  montelukast (SINGULAIR) 10 MG tablet Take 10 mg by mouth at bedtime. 10/13/19   [provider]  Multiple Vitamin (MULTIVITAMIN WITH MINERALS) TABS tablet Take 1 tablet by mouth daily.    [provider]  Nebivolol HCl (BYSTOLIC) 20 MG TABS Take 20 mg by mouth at bedtime.    [provider]  ondansetron (ZOFRAN) 4 MG tablet Take 1 tablet (4 mg total) by mouth every 6 (six) hours as needed for nausea. 09/21/20   Levin Erp, PA  pantoprazole (PROTONIX) 40 MG tablet Take 1 tablet by mouth twice daily Patient taking differently: Take 40 mg by mouth daily. 05/04/20   Milus Banister, MD  pravastatin (PRAVACHOL) 20 MG tablet Take 20 mg by mouth daily. 11/05/20   [provider]  pregabalin (LYRICA) 100 MG capsule Take 100-300 mg by mouth See admin instructions. Take 2 capsule (200 mg) by mouth every morning and take three capsules (300 mg) at bedtime 10/15/19   [provider]  Probiotic Product (PROBIOTIC PO) Take 1 tablet by mouth daily. gummies    [provider]  traZODone (DESYREL) 100 MG tablet Take 100 mg by mouth at bedtime. 07/24/16   [provider]    Allergies    Contrast media [iodinated diagnostic agents]  Review of Systems   Review of Systems  Respiratory:  Positive for shortness of breath.   Neurological:  Positive for weakness.  All other systems reviewed and are negative.  Physical Exam Updated Vital Signs BP (!) 179/94   Pulse 65   Resp (!) 26   Ht 5\' 11"  (1.803 m)   Wt 88.5 kg   SpO2 93%   BMI 27.20 kg/m   Physical Exam  Vitals and nursing note reviewed.  Constitutional:      Comments: Tachypneic  HENT:     Head: Normocephalic.     Mouth/Throat:     Mouth: Mucous membranes are moist.  Eyes:     Extraocular Movements: Extraocular movements intact.     Pupils: Pupils are equal, round, and reactive to light.  Cardiovascular:     Rate and Rhythm: Normal rate and regular rhythm.  Pulmonary:     Comments: Crackles bilateral bases. Musculoskeletal:     Cervical back: Normal range of motion and neck supple.     Comments: 1+ edema bilaterally  Skin:    General: Skin is warm.     Capillary Refill: Capillary refill takes less than 2 seconds.  Neurological:     General: No focal deficit present.     Mental Status: He is oriented to person, place, and time.  Psychiatric:        Mood and Affect: Mood normal.        Behavior: Behavior normal.    ED Results / Procedures / Treatments   Labs (all labs ordered are listed, but only abnormal results are displayed) Labs Reviewed  CBC WITH DIFFERENTIAL/PLATELET - Abnormal; Notable for the following components:      Result Value   RBC 3.59 (*)    Hemoglobin 10.7 (*)    HCT 33.0 (*)    All other components within normal limits  COMPREHENSIVE METABOLIC PANEL - Abnormal; Notable for the following components:   Glucose, Bld 121 (*)    Creatinine, Ser 1.30 (*)    Calcium 8.4 (*)    All other components within normal limits  BRAIN NATRIURETIC  PEPTIDE - Abnormal; Notable for the following components:   B Natriuretic Peptide 683.9 (*)    All other components within normal limits  BLOOD GAS, VENOUS - Abnormal; Notable for the following components:   pCO2, Ven 42.7 (*)    pO2, Ven 47.7 (*)    All other components within normal limits  RESP PANEL BY RT-PCR (FLU A&B, COVID) [id number]  TROPONIN I (HIGH SENSITIVITY)  TROPONIN I (HIGH SENSITIVITY)    EKG EKG Interpretation  Date/Time:  Wednesday March 08 2021 20:00:31 EST Ventricular Rate:  64 PR Interval:  245 QRS Duration: 100 QT Interval:  440 QTC Calculation: 454 R Axis:   104 Text Interpretation: Sinus or ectopic atrial rhythm Supraventricular bigeminy Prolonged PR interval Right axis deviation Baseline wander in lead(s) V2 V3 No significant change since last tracing Confirmed by Wandra Arthurs [phone]) on 03/08/2021 8:53:09 PM  Radiology DG Chest Port 1 View  Result Date: 03/08/2021 CLINICAL DATA:  Shortness of breath EXAM: PORTABLE CHEST 1 VIEW COMPARISON:  01/26/2021 FINDINGS: Moderate cardiomegaly with mild interstitial pulmonary edema. No pneumothorax. Suspect small pleural effusions. IMPRESSION: Cardiomegaly and mild interstitial pulmonary edema. Electronically Signed   By: Ulyses Jarred M.D.   On: 03/08/2021 21:15    Procedures Procedures   CRITICAL CARE Performed by: Wandra Arthurs   Total critical care time: 30 minutes  Critical care time was exclusive of separately billable procedures and treating other patients.  Critical care was necessary to treat or prevent imminent or life-threatening deterioration.  Critical care was time spent personally by me on the following activities: development of treatment plan with patient and/or surrogate as well as nursing, discussions with consultants, evaluation of patient's response to treatment, examination of patient, obtaining history from patient or surrogate, ordering and performing treatments and interventions, ordering  and review of laboratory studies, ordering and review of radiographic studies, pulse oximetry and re-evaluation of patient's condition.   Medications Ordered in ED Medications  furosemide (LASIX) injection 40 mg (has no administration in time range)  albuterol (VENTOLIN HFA) 108 (90 Base) MCG/ACT inhaler 2 puff (2 puffs Inhalation Given 03/08/21 2001)  methylPREDNISolone sodium succinate (SOLU-MEDROL) 125 mg/2 mL injection 125 mg (125 mg Intravenous Given 03/08/21 2002)  hydrALAZINE (APRESOLINE) injection 10 mg (10 mg Intravenous Given 03/08/21 2107)    ED Course  I have reviewed the triage vital signs and the nursing notes.  Pertinent labs & imaging results that were available during my care of the patient were reviewed by me and considered in my medical decision making (see chart for details).    MDM Rules/Calculators/A&P                           KAYEN GRABEL is a 65 y.o. male here with SOB.  Patient has recent COVID infection.  Patient appears volume overloaded.  Patient is hypoxic to 82% on room air.  Concern for possible CHF versus pneumonia.  Plan to get CBC and CMP and BNP, CXR   10:20 PM Chest x-ray showed pulmonary edema.  Patient's BNP is elevated to about 680.  Patient is on 5 L nasal cannula oxygen level is around 90%.  I wonder if he has new onset CHF.  Given Lasix in the ED.  Of note, patient has a contrast dye allergy so unable to get a CT PE but history and physical consistent with CHF.  At this point patient will be admitted for hypoxia from CHF   Final Clinical Impression(s) / ED Diagnoses Final diagnoses:  None    Rx / DC Orders ED Discharge Orders     None        Drenda Freeze, MD 03/08/21 2222

## 2021-03-08 NOTE — H&P (Signed)
History and Physical    Jesse Lane OZD:[id number] DOB: 06-14-1955 DOA: 03/08/2021  PCP: Jesse Rasmussen, MD   Patient coming from: Home   Chief Complaint: SOB   HPI: Jesse Lane is a pleasant 65 y.o. male with medical history significant for hypertension, insulin-dependent diabetes mellitus, and admission in October for complications of [id number], now presenting to the emergency department with worsening shortness of breath.  Patient reports that he had been doing fairly well at home until he became noticeably short of breath 2 or 3 days ago.  He has become progressively more dyspneic since then and was even short of breath at rest this evening.  He denies any chest pain or cough associated with this and has not noticed any leg swelling.  He denies fevers or chills.  ED Course: Upon arrival to the ED, patient is found to be saturating in the 80s on room air, tachypneic, and hypertensive.  EKG features sinus or ectopic atrial rhythm with PVCs and first-degree AV nodal block.  Chest x-ray with cardiomegaly and interstitial pulmonary edema.  Troponin is normal and BNP 684.  Chemistry panel with creatinine 1.30.  Patient was given 40 mg IV Lasix, IV hydralazine, IV Solu-Medrol, and albuterol in the ED.  Review of Systems:  All other systems reviewed and apart from HPI, are negative.  Past Medical History:  Diagnosis Date   Arthritis    Diabetes mellitus type 2, uncontrolled    Diabetic neuropathy (HCC)    GERD (gastroesophageal reflux disease)    History of stomach ulcers    Hypertension    Melanoma ([place])    mets to lymph node and intestines    Past Surgical History:  Procedure Laterality Date   ABCESS DRAINAGE     gluteal area   COLONOSCOPY     INGUINAL HERNIA REPAIR N/A 08/15/2015   Procedure: HERNIA REPAIR INGUINAL INCARCERATED;  Surgeon: Alphonsa Overall, MD;  Location: [facility]S;  Service: General;  Laterality: N/A;  with MESH   pyloric stenosis     TONSILLECTOMY       Social History:   reports that he has quit smoking. His smoking use included cigarettes. He has a 5.00 pack-year smoking history. He has never used smokeless tobacco. He reports current alcohol use of about 2.0 standard drinks per week. He reports that he does not currently use drugs after having used the following drugs: Marijuana.  Allergies  Allergen Reactions   Contrast Media [Iodinated Diagnostic Agents] Itching    Family History  Problem Relation Age of Onset   Breast cancer Mother    Bone cancer Mother    Melanoma Mother    Prostate cancer Father    Pancreatic cancer Maternal Grandfather    Diabetes Paternal Aunt    Colon cancer Neg Hx    Colon polyps Neg Hx    Esophageal cancer Neg Hx    Stomach cancer Neg Hx      Prior to Admission medications   Medication Sig Start Date End Date Taking? Authorizing Provider  amLODipine (NORVASC) 10 MG tablet Take 10 mg by mouth daily. 11/28/19  Yes [provider]  diphenhydramine-acetaminophen (TYLENOL PM) 25-500 MG TABS tablet Take 2 tablets by mouth at bedtime as needed (sleep).   Yes [provider]  hydrochlorothiazide (HYDRODIURIL) 25 MG tablet Take 25 mg by mouth at bedtime.   Yes [provider]  insulin degludec (TRESIBA FLEXTOUCH) 100 UNIT/ML FlexTouch Pen Inject 24 Units into the skin daily before breakfast.  Yes [provider]  insulin lispro (HUMALOG) 100 UNIT/ML KwikPen Inject 0-20 Units into the skin 3 (three) times daily. Per sliding scale   Yes [provider]  lisinopril (PRINIVIL,ZESTRIL) 40 MG tablet Take 40 mg by mouth at bedtime.  07/24/16  Yes [provider]  montelukast (SINGULAIR) 10 MG tablet Take 10 mg by mouth at bedtime. 10/13/19  Yes [provider]  Multiple Vitamin (MULTIVITAMIN WITH MINERALS) TABS tablet Take 1 tablet by mouth daily.   Yes [provider]  Nebivolol HCl (BYSTOLIC) 20 MG TABS Take 20 mg by mouth at bedtime.   Yes  [provider]  ondansetron (ZOFRAN) 4 MG tablet Take 1 tablet (4 mg total) by mouth every 6 (six) hours as needed for nausea. 09/21/20  Yes Levin Erp, PA  pantoprazole (PROTONIX) 40 MG tablet Take 1 tablet by mouth twice daily Patient taking differently: Take 40 mg by mouth daily. 05/04/20  Yes Milus Banister, MD  pravastatin (PRAVACHOL) 20 MG tablet Take 20 mg by mouth daily. 11/05/20  Yes [provider]  pregabalin (LYRICA) 100 MG capsule Take 100-300 mg by mouth See admin instructions. Take 2 capsule (200 mg) by mouth every morning and take three capsules (300 mg) at bedtime 10/15/19  Yes [provider]  Probiotic Product (PROBIOTIC PO) Take 1 tablet by mouth daily. gummies   Yes [provider]  traZODone (DESYREL) 100 MG tablet Take 100 mg by mouth at bedtime. 07/24/16  Yes [provider]  Continuous Blood Gluc Receiver ([place]) [place] by Does not apply route.    [provider]  Continuous Blood Gluc Sensor (DEXCOM G6 SENSOR) MISC by Does not apply route.    [provider]  Continuous Blood Gluc Transmit (DEXCOM G6 TRANSMITTER) MISC by Does not apply route.    [provider]  Insulin Pen Needle (BD PEN NEEDLE MICRO U/F) 32G X 6 MM MISC 1 each by Other route See admin instructions. Use one pen needle to inject insulin 4 times daily. **must have appointment for additional refills.** 06/30/18   Renato Shin, MD  Insulin Pen Needle (BD PEN NEEDLE MICRO U/F) 32G X 6 MM MISC USE 1 PEN NEEDLE SIX TIMES DAILY Patient taking differently: 1 each by Other route See admin instructions. USE 1 PEN NEEDLE SIX TIMES DAILY 07/02/18   Renato Shin, MD    Physical Exam: Vitals:   03/08/21 2015 03/08/21 2100 03/08/21 2130 03/08/21 2300  BP:  (!) 201/93 (!) 179/94 (!) 177/81  Pulse: 64 64 65 64  Resp: (!) 25 18 (!) 26 (!) 24  SpO2: 92% 93% 93% 93%  Weight:      Height:        Constitutional: NAD, calm   Eyes: PERTLA, lids and conjunctivae normal ENMT: Mucous membranes are moist. Posterior pharynx clear of any exudate or lesions.   Neck: supple, no masses  Respiratory: Fine rales b/l. No wheezing. No accessory muscle use.  Cardiovascular: S1 & S2 heard, regular rate and rhythm.  No significant JVD. Abdomen: No distension, no tenderness, soft. Bowel sounds active.  Musculoskeletal: no clubbing / cyanosis. No joint deformity upper and lower extremities.   Skin: no significant rashes, lesions, ulcers. Warm, dry, well-perfused. Neurologic: CN 2-12 grossly intact. Moving all extremities. Alert and oriented.  Psychiatric: Pleasant. Cooperative.    Labs and Imaging on Admission: I have personally reviewed following labs and imaging studies  CBC: Recent Labs  Lab 03/08/21 2033  WBC  5.4  NEUTROABS 3.5  HGB 10.7*  HCT 33.0*  MCV 91.9  PLT 093   Basic Metabolic Panel: Recent Labs  Lab 03/08/21 2033  NA 140  K 3.7  CL 108  CO2 26  GLUCOSE 121*  BUN 18  CREATININE 1.30*  CALCIUM 8.4*   GFR: Estimated Creatinine Clearance: 59.5 mL/min (A) (by C-G formula based on SCr of 1.3 mg/dL (H)). Liver Function Tests: Recent Labs  Lab 03/08/21 2033  AST 15  ALT 12  ALKPHOS 56  BILITOT 1.2  PROT 7.1  ALBUMIN 3.8   No results for input(s): LIPASE, AMYLASE in the last 168 hours. No results for input(s): AMMONIA in the last 168 hours. Coagulation Profile: No results for input(s): INR, PROTIME in the last 168 hours. Cardiac Enzymes: No results for input(s): CKTOTAL, CKMB, CKMBINDEX, TROPONINI in the last 168 hours. BNP (last 3 results) No results for input(s): PROBNP in the last 8760 hours. HbA1C: No results for input(s): HGBA1C in the last 72 hours. CBG: Recent Labs  Lab 03/08/21 2345  GLUCAP 116*   Lipid Profile: No results for input(s): CHOL, HDL, LDLCALC, TRIG, CHOLHDL, LDLDIRECT in the last 72 hours. Thyroid Function Tests: No results for input(s): TSH, T4TOTAL, FREET4,  T3FREE, THYROIDAB in the last 72 hours. Anemia Panel: No results for input(s): VITAMINB12, FOLATE, FERRITIN, TIBC, IRON, RETICCTPCT in the last 72 hours. Urine analysis:    Component Value Date/Time   COLORURINE YELLOW 08/27/2020 1930   APPEARANCEUR CLEAR 08/27/2020 1930   LABSPEC 1.023 08/27/2020 1930   PHURINE 5.0 08/27/2020 1930   GLUCOSEU 50 (A) 08/27/2020 1930   HGBUR MODERATE (A) 08/27/2020 1930   BILIRUBINUR NEGATIVE 08/27/2020 1930   KETONESUR NEGATIVE 08/27/2020 1930   PROTEINUR >=300 (A) 08/27/2020 1930   UROBILINOGEN 0.2 12/05/2011 1655   NITRITE NEGATIVE 08/27/2020 1930   LEUKOCYTESUR NEGATIVE 08/27/2020 1930   Sepsis Labs: @LABRCNTIP (procalcitonin:4,lacticidven:4) ) Recent Results (from the past 240 hour(s))  Resp Panel by RT-PCR (Flu A&B, Covid) Nasopharyngeal Swab     Status: None   Collection Time: 03/08/21  8:00 PM   Specimen: Nasopharyngeal Swab; Nasopharyngeal(NP) swabs in vial transport medium  Result Value Ref Range Status   SARS Coronavirus 2 by RT PCR NEGATIVE NEGATIVE Final    Comment: (NOTE) SARS-CoV-2 target nucleic acids are NOT DETECTED.  The SARS-CoV-2 RNA is generally detectable in upper respiratory specimens during the acute phase of infection. The lowest concentration of SARS-CoV-2 viral copies this assay can detect is 138 copies/mL. A negative result does not preclude SARS-Cov-2 infection and should not be used as the sole basis for treatment or other patient management decisions. A negative result may occur with  improper specimen collection/handling, submission of specimen other than nasopharyngeal swab, presence of viral mutation(s) within the areas targeted by this assay, and inadequate number of viral copies(<138 copies/mL). A negative result must be combined with clinical observations, patient history, and epidemiological information. The expected result is Negative.  Fact Sheet for Patients:   EntrepreneurPulse.com.au  Fact Sheet for Healthcare Providers:  IncredibleEmployment.be  This test is no t yet approved or cleared by the [place] FDA and  has been authorized for detection and/or diagnosis of SARS-CoV-2 by FDA under an Emergency Use Authorization (EUA). This EUA will remain  in effect (meaning this test can be used) for the duration of the COVID-19 declaration under Section 564(b)(1) of the Act, 21 U.S.C.section 360bbb-3(b)(1), unless the authorization is terminated  or revoked sooner.  Influenza A by PCR NEGATIVE NEGATIVE Final   Influenza B by PCR NEGATIVE NEGATIVE Final    Comment: (NOTE) The Xpert Xpress SARS-CoV-2/FLU/RSV plus assay is intended as an aid in the diagnosis of influenza from Nasopharyngeal swab specimens and should not be used as a sole basis for treatment. Nasal washings and aspirates are unacceptable for Xpert Xpress SARS-CoV-2/FLU/RSV testing.  Fact Sheet for Patients: EntrepreneurPulse.com.au  Fact Sheet for Healthcare Providers: IncredibleEmployment.be  This test is not yet approved or cleared by the [place] FDA and has been authorized for detection and/or diagnosis of SARS-CoV-2 by FDA under an Emergency Use Authorization (EUA). This EUA will remain in effect (meaning this test can be used) for the duration of the COVID-19 declaration under Section 564(b)(1) of the Act, 21 U.S.C. section 360bbb-3(b)(1), unless the authorization is terminated or revoked.  Performed at [facility], [place] [address] [place], [place] [address]      Radiological Exams on Admission: DG Chest Port 1 View  Result Date: 03/08/2021 CLINICAL DATA:  Shortness of breath EXAM: PORTABLE CHEST 1 VIEW COMPARISON:  01/26/2021 FINDINGS: Moderate cardiomegaly with mild interstitial pulmonary edema. No pneumothorax. Suspect small pleural effusions. IMPRESSION:  Cardiomegaly and mild interstitial pulmonary edema. Electronically Signed   By: Ulyses Jarred M.D.   On: 03/08/2021 21:15    EKG: Independently reviewed. Sinus or ectopic atrial rhythm, PVCs, 1st degree AV block.   Assessment/Plan   1. Acute on chronic diastolic CHF; acute hypoxic respiratory failure  - Presents with progressive SOB and found to have O2 saturations in 80s, BNP 684, and pulmonary edema on CXR  - He was given 40 mg IV Lasix in ED  - EF was 65-70% on TTE from July 2021  - Continue diuresis with IV Lasix, monitor wt and I/Os, monitor renal function and electrolytes, and update echocardiogram    2. Insulin-dependent DM  - A1c was 5.9% in October 2022  - Continue CBGs and insulin    3. CKD II  - SCr is 1.30 on admission, similar to priors  - Monitor closely while diuresing    4. Hypertension  - BP elevated in ED, improving with diuresis, continue Norvasc, lisinopril, and nebivolol    DVT prophylaxis: Lovenox  Code Status: Full  Level of Care: Level of care: Progressive Family Communication: none present  Disposition Plan:  Patient is from: home  Anticipated d/c is to: home  Anticipated d/c date is: 03/11/21 Patient currently: Pending improvement in respiratory status, echo  Consults called: none  Admission status: Inpatient     Vianne Bulls, MD [facility]  03/09/2021, 12:36 AM

## 2021-03-08 NOTE — ED Notes (Signed)
Call placed for transportation they will bring a hospital bed per RN request

## 2021-03-08 NOTE — ED Notes (Signed)
Pt was found by other staff member to be desatting into the 25s. Dr. Darl Householder was called to pt's bedside to evaluate and orders were placed for medications and labwork. Oxygen was increased to 6 liters on humidified [place] and sats are now low 90%s. Discussed with pt the need for nonrebreather and pt refuses that at this time as he used it in the past and it was uncomfortable. Will continue to monitor.

## 2021-03-08 NOTE — ED Triage Notes (Signed)
Pt BIB EMS from home, c/o SOB with exertion and weakness for past 2 days. Lungs clear, s/p covid 6+ weeks ago. Sats 82%, placed on O2 at 4 liters.

## 2021-03-09 ENCOUNTER — Other Ambulatory Visit (HOSPITAL_COMMUNITY): Payer: Medicare Other

## 2021-03-09 ENCOUNTER — Inpatient Hospital Stay (HOSPITAL_COMMUNITY): Payer: Medicare Other

## 2021-03-09 ENCOUNTER — Encounter (HOSPITAL_COMMUNITY): Payer: Self-pay | Admitting: Family Medicine

## 2021-03-09 DIAGNOSIS — I5033 Acute on chronic diastolic (congestive) heart failure: Secondary | ICD-10-CM

## 2021-03-09 DIAGNOSIS — K219 Gastro-esophageal reflux disease without esophagitis: Secondary | ICD-10-CM

## 2021-03-09 LAB — BASIC METABOLIC PANEL
Anion gap: 9 (ref 5–15)
BUN: 19 mg/dL (ref 8–23)
CO2: 24 mmol/L (ref 22–32)
Calcium: 8.4 mg/dL — ABNORMAL LOW (ref 8.9–10.3)
Chloride: 105 mmol/L (ref 98–111)
Creatinine, Ser: 1.28 mg/dL — ABNORMAL HIGH (ref 0.61–1.24)
GFR, Estimated: >60 mL/min (ref >60)
Glucose, Bld: 203 mg/dL — ABNORMAL HIGH (ref 70–99)
Potassium: 4 mmol/L (ref 3.5–5.1)
Sodium: 138 mmol/L (ref 135–145)

## 2021-03-09 LAB — ECHOCARDIOGRAM COMPLETE
AR max vel: 2.93 cm2
AV Area VTI: 3.28 cm2
AV Area mean vel: 3.06 cm2
AV Mean grad: 6 mmHg
AV Peak grad: 12.7 mmHg
Ao pk vel: 1.78 m/s
Area-P 1/2: 3.31 cm2
Height: 71 in
S' Lateral: 3.4 cm
Weight: 3120 oz

## 2021-03-09 LAB — D-DIMER, QUANTITATIVE: D-Dimer, Quant: 0.87 ug/mL-FEU — ABNORMAL HIGH (ref 0.00–0.50)

## 2021-03-09 LAB — GLUCOSE, RANDOM: Glucose, Bld: 480 mg/dL — ABNORMAL HIGH (ref 70–99)

## 2021-03-09 LAB — CBG MONITORING, ED
Glucose-Capillary: 218 mg/dL — ABNORMAL HIGH (ref 70–99)
Glucose-Capillary: 321 mg/dL — ABNORMAL HIGH (ref 70–99)

## 2021-03-09 LAB — GLUCOSE, CAPILLARY
Glucose-Capillary: 449 mg/dL — ABNORMAL HIGH (ref 70–99)
Glucose-Capillary: 325 mg/dL — ABNORMAL HIGH (ref 70–99)

## 2021-03-09 MED ORDER — INSULIN GLARGINE-YFGN 100 UNIT/ML ~~LOC~~ SOLN
20.0000 [IU] | Freq: Every day | SUBCUTANEOUS | Status: DC
  Administered 2021-03-10: 20 [IU] via SUBCUTANEOUS
  Filled 2021-03-09: qty 0.2

## 2021-03-09 MED ORDER — INSULIN GLARGINE-YFGN 100 UNIT/ML ~~LOC~~ SOLN
10.0000 [IU] | Freq: Once | SUBCUTANEOUS | Status: AC
  Administered 2021-03-09: 10 [IU] via SUBCUTANEOUS
  Filled 2021-03-09: qty 0.1

## 2021-03-09 MED ORDER — INSULIN ASPART 100 UNIT/ML IJ SOLN
0.0000 [IU] | Freq: Every day | INTRAMUSCULAR | Status: DC
  Administered 2021-03-09: 4 [IU] via SUBCUTANEOUS

## 2021-03-09 MED ORDER — INSULIN ASPART 100 UNIT/ML IJ SOLN
20.0000 [IU] | Freq: Once | INTRAMUSCULAR | Status: AC
  Administered 2021-03-09: 20 [IU] via SUBCUTANEOUS

## 2021-03-09 MED ORDER — FUROSEMIDE 10 MG/ML IJ SOLN
40.0000 mg | Freq: Two times a day (BID) | INTRAMUSCULAR | Status: DC
  Administered 2021-03-09 – 2021-03-10 (×3): 40 mg via INTRAVENOUS
  Filled 2021-03-09 (×3): qty 4

## 2021-03-09 MED ORDER — INSULIN ASPART 100 UNIT/ML IJ SOLN
0.0000 [IU] | Freq: Three times a day (TID) | INTRAMUSCULAR | Status: DC
  Administered 2021-03-10 (×3): 7 [IU] via SUBCUTANEOUS

## 2021-03-09 MED ORDER — LISINOPRIL 20 MG PO TABS
40.0000 mg | ORAL_TABLET | Freq: Every day | ORAL | Status: DC
  Administered 2021-03-09 (×2): 40 mg via ORAL
  Filled 2021-03-09: qty 2

## 2021-03-09 NOTE — Progress Notes (Incomplete)
°  Echocardiogram 2D Echocardiogram has been performed.  Jesse Lane F 03/09/2021, 5:10 PM

## 2021-03-09 NOTE — Assessment & Plan Note (Addendum)
Weaned to RA In setting of heart failure D dimer mildly elevated VQ scan, LE [place] negative

## 2021-03-09 NOTE — Assessment & Plan Note (Addendum)
CXR with cardiomegaly and mild interstitial pulm edema Repeat CXR with mild pulm vascular congestion, small bilateral effusions Not grossly overloaded on exam, but bibasilar crackles, CXR findings, bnp Echo 10/2019 with EF 95-79%, grade 1 diastolic dysfunction Repeat echo with grade II diastolic dysfunction, LVH, echo 60-65% (see report) Elevated BNP, normal trop Strict I/O, daily weights Discharge on oral lasix - outpatient PCP follow up

## 2021-03-09 NOTE — Assessment & Plan Note (Signed)
Basal, SSI

## 2021-03-09 NOTE — Assessment & Plan Note (Addendum)
Creatinine at baseline, follow with diuresis outpatient

## 2021-03-09 NOTE — Assessment & Plan Note (Signed)
PPI ?

## 2021-03-09 NOTE — ED Notes (Signed)
Pt provided 3 units of insulin at 800.

## 2021-03-09 NOTE — Progress Notes (Addendum)
PROGRESS NOTE    ANSON PEDDIE  LYY:[id number] DOB: 03/21/1956 DOA: 03/08/2021 PCP: [name], MD   Chief Complaint  Patient presents with   Shortness of Breath   Weakness    Brief Narrative:  65 yo with hx HTN, T2DM, recent covid 19 infection who is presenting with worsening shortness of breath with findings c/w heart failure exacerbation.  Assessment & Plan:   Principal Problem:   Acute on chronic diastolic CHF (congestive heart failure) (HCC) Active Problems:   Acute respiratory failure with hypoxia (HCC)   Essential hypertension   Insulin-requiring or dependent type II diabetes mellitus (HCC)   CKD (chronic kidney disease), stage II   GERD (gastroesophageal reflux disease)  * Acute on chronic diastolic CHF (congestive heart failure) (HCC) CXR with cardiomegaly and mild interstitial pulm edema Not grossly overloaded on exam, but bibasilar crackles, CXR findings, bnp Echo 10/2019 with EF 12-75%, grade 1 diastolic dysfunction Elevated BNP, normal trop Strict I/O, daily weights Lasix 40 IV BID Repeat echo pending  Acute respiratory failure with hypoxia (HCC) Currently requiring 5 L In setting of heart failure Follow d dimer with recent covid 19 infection  Essential hypertension Continue lasix, amlodipine, lasix, nebivolol May need to add additional agents if continued hypertension   Insulin-requiring or dependent type II diabetes mellitus ([place]) Basal, SSI  CKD (chronic kidney disease), stage II Creatinine at baseline, follow with diuresis  GERD (gastroesophageal reflux disease) PPI   DVT prophylaxis: lovenox Code Status: full Family Communication: none  Status is: Inpatient  Remains inpatient appropriate because: need for IV diuresis       Consultants:  none  Procedures:  none  Antimicrobials:  Anti-infectives (From admission, onward)    None          Subjective: C/o SOB  Objective: Vitals:   03/09/21 0830 03/09/21  0840 03/09/21 0843 03/09/21 0845  BP:    (!) 198/135  Pulse: 74 72  75  Resp: 18 (!) 22  16  Temp:    98.2 F (36.8 C)  TempSrc:    Oral  SpO2: 96% 97% 96% 96%  Weight:      Height:        Intake/Output Summary (Last 24 hours) at 03/09/2021 1005 Last data filed at 03/09/2021 0002 Gross per 24 hour  Intake 3 ml  Output --  Net 3 ml   Filed Weights   03/08/21 1804  Weight: 88.5 kg    Examination:  General exam: Appears calm and comfortable  Respiratory system: bibasilar crackles, difficult time for him sitting up, difficult exam in this way Cardiovascular system: RRR Gastrointestinal system: Abdomen is nondistended, soft and nontender.  Central nervous system: Alert and oriented. No focal neurological deficits. Extremities: no LEE Skin: No rashes, lesions or ulcers Psychiatry: Judgement and insight appear normal. Mood & affect appropriate.     Data Reviewed: I have personally reviewed following labs and imaging studies  CBC: Recent Labs  Lab 03/08/21 2033  WBC 5.4  NEUTROABS 3.5  HGB 10.7*  HCT 33.0*  MCV 91.9  PLT 170    Basic Metabolic Panel: Recent Labs  Lab 03/08/21 2033 03/09/21 0646  NA 140 138  K 3.7 4.0  CL 108 105  CO2 26 24  GLUCOSE 121* 203*  BUN 18 19  CREATININE 1.30* 1.28*  CALCIUM 8.4* 8.4*    GFR: Estimated Creatinine Clearance: 60.5 mL/min ([place]) (by C-G formula based on SCr of 1.28 mg/dL (H)).  Liver Function Tests: Recent Labs  Lab 03/08/21 2033  AST 15  ALT 12  ALKPHOS 56  BILITOT 1.2  PROT 7.1  ALBUMIN 3.8    CBG: Recent Labs  Lab 03/08/21 2345 03/09/21 0750  GLUCAP 116* 218*     Recent Results (from the past 240 hour(s))  Resp Panel by RT-PCR (Flu Rayli Wiederhold&B, Covid) Nasopharyngeal Swab     Status: None   Collection Time: 03/08/21  8:00 PM   Specimen: Nasopharyngeal Swab; Nasopharyngeal(NP) swabs in vial transport medium  Result Value Ref Range Status   SARS Coronavirus 2 by RT PCR NEGATIVE NEGATIVE Final     Comment: (NOTE) SARS-CoV-2 target nucleic acids are NOT DETECTED.  The SARS-CoV-2 RNA is generally detectable in upper respiratory specimens during the acute phase of infection. The lowest concentration of SARS-CoV-2 viral copies this assay can detect is 138 copies/mL. Jamey Harman negative result does not preclude SARS-Cov-2 infection and should not be used as the sole basis for treatment or other patient management decisions. Natascha Edmonds negative result may occur with  improper specimen collection/handling, submission of specimen other than nasopharyngeal swab, presence of viral mutation(s) within the areas targeted by this assay, and inadequate number of viral copies(<138 copies/mL). Fredonia Casalino negative result must be combined with clinical observations, patient history, and epidemiological information. The expected result is Negative.  Fact Sheet for Patients:  EntrepreneurPulse.com.au  Fact Sheet for Healthcare Providers:  IncredibleEmployment.be  This test is no t yet approved or cleared by the [place] FDA and  has been authorized for detection and/or diagnosis of SARS-CoV-2 by FDA under an Emergency Use Authorization (EUA). This EUA will remain  in effect (meaning this test can be used) for the duration of the COVID-19 declaration under Section 564(b)(1) of the Act, 21 U.S.C.section 360bbb-3(b)(1), unless the authorization is terminated  or revoked sooner.       Influenza Nayelly Laughman by PCR NEGATIVE NEGATIVE Final   Influenza B by PCR NEGATIVE NEGATIVE Final    Comment: (NOTE) The Xpert Xpress SARS-CoV-2/FLU/RSV plus assay is intended as an aid in the diagnosis of influenza from Nasopharyngeal swab specimens and should not be used as Naveah Brave sole basis for treatment. Nasal washings and aspirates are unacceptable for Xpert Xpress SARS-CoV-2/FLU/RSV testing.  Fact Sheet for Patients: EntrepreneurPulse.com.au  Fact Sheet for Healthcare  Providers: IncredibleEmployment.be  This test is not yet approved or cleared by the [place] FDA and has been authorized for detection and/or diagnosis of SARS-CoV-2 by FDA under an Emergency Use Authorization (EUA). This EUA will remain in effect (meaning this test can be used) for the duration of the COVID-19 declaration under Section 564(b)(1) of the Act, 21 U.S.C. section 360bbb-3(b)(1), unless the authorization is terminated or revoked.  Performed at [facility], [place] [address] [place], [place] [address]          Radiology Studies: [facility] Chest Port 1 View  Result Date: 03/08/2021 CLINICAL DATA:  Shortness of breath EXAM: PORTABLE CHEST 1 VIEW COMPARISON:  01/26/2021 FINDINGS: Moderate cardiomegaly with mild interstitial pulmonary edema. No pneumothorax. Suspect small pleural effusions. IMPRESSION: Cardiomegaly and mild interstitial pulmonary edema. Electronically Signed   By: Ulyses Jarred M.D.   On: 03/08/2021 21:15        Scheduled Meds:  amLODipine  10 mg Oral Daily   enoxaparin (LOVENOX) injection  40 mg Subcutaneous Q24H   furosemide  40 mg Intravenous BID   insulin aspart  0-5 Units Subcutaneous QHS   insulin aspart  0-9 Units Subcutaneous TID WC   insulin glargine-yfgn  10 Units Subcutaneous Daily   lisinopril  40 mg Oral QHS   melatonin  5 mg Oral QHS   montelukast  10 mg Oral QHS   nebivolol  20 mg Oral QHS   pantoprazole  40 mg Oral Daily   pravastatin  20 mg Oral Daily   pregabalin  200 mg Oral Daily   pregabalin  300 mg Oral QHS   sodium chloride flush  3 mL Intravenous Q12H   traZODone  100 mg Oral QHS   Continuous Infusions:  sodium chloride       LOS: 1 day    Time spent: over 30 min    Fayrene Helper, MD [facility]   To contact the attending provider between 7A-7P or the covering provider during after hours 7P-7A, please log into the web site [email or web address] and access using universal  [place] password for that web site. If you do not have the password, please call the hospital operator.  03/09/2021, 10:05 AM

## 2021-03-09 NOTE — Assessment & Plan Note (Addendum)
Continue lasix, amlodipine, lasix, nebivolol May need to add additional agents if continued hypertension - follow outpatient

## 2021-03-10 ENCOUNTER — Inpatient Hospital Stay (HOSPITAL_COMMUNITY): Payer: Medicare Other

## 2021-03-10 DIAGNOSIS — R609 Edema, unspecified: Secondary | ICD-10-CM

## 2021-03-10 DIAGNOSIS — I5033 Acute on chronic diastolic (congestive) heart failure: Secondary | ICD-10-CM | POA: Diagnosis not present

## 2021-03-10 LAB — CBC WITH DIFFERENTIAL/PLATELET
Abs Immature Granulocytes: 0.02 10*3/uL (ref 0.00–0.07)
Basophils Absolute: 0 10*3/uL (ref 0.0–0.1)
Basophils Relative: 0 %
Eosinophils Absolute: 0 10*3/uL (ref 0.0–0.5)
Eosinophils Relative: 0 %
HCT: 30.9 % — ABNORMAL LOW (ref 39.0–52.0)
Hemoglobin: 10.2 g/dL — ABNORMAL LOW (ref 13.0–17.0)
Immature Granulocytes: 0 %
Lymphocytes Relative: 21 %
Lymphs Abs: 1 10*3/uL (ref 0.7–4.0)
MCH: 30 pg (ref 26.0–34.0)
MCHC: 33 g/dL (ref 30.0–36.0)
MCV: 90.9 fL (ref 80.0–100.0)
Monocytes Absolute: 0.3 10*3/uL (ref 0.1–1.0)
Monocytes Relative: 7 %
Neutro Abs: 3.3 10*3/uL (ref 1.7–7.7)
Neutrophils Relative %: 72 %
Platelets: 194 10*3/uL (ref 150–400)
RBC: 3.4 MIL/uL — ABNORMAL LOW (ref 4.22–5.81)
RDW: 15.3 % (ref 11.5–15.5)
WBC: 4.7 10*3/uL (ref 4.0–10.5)
nRBC: 0 % (ref 0.0–0.2)

## 2021-03-10 LAB — COMPREHENSIVE METABOLIC PANEL
ALT: 10 U/L (ref 0–44)
AST: 11 U/L — ABNORMAL LOW (ref 15–41)
Albumin: 3.3 g/dL — ABNORMAL LOW (ref 3.5–5.0)
Alkaline Phosphatase: 48 U/L (ref 38–126)
Anion gap: 4 — ABNORMAL LOW (ref 5–15)
BUN: 23 mg/dL (ref 8–23)
CO2: 30 mmol/L (ref 22–32)
Calcium: 8.1 mg/dL — ABNORMAL LOW (ref 8.9–10.3)
Chloride: 100 mmol/L (ref 98–111)
Creatinine, Ser: 1.35 mg/dL — ABNORMAL HIGH (ref 0.61–1.24)
GFR, Estimated: 58 mL/min — ABNORMAL LOW (ref >60)
Glucose, Bld: 250 mg/dL — ABNORMAL HIGH (ref 70–99)
Potassium: 3.7 mmol/L (ref 3.5–5.1)
Sodium: 134 mmol/L — ABNORMAL LOW (ref 135–145)
Total Bilirubin: 1.4 mg/dL — ABNORMAL HIGH (ref 0.3–1.2)
Total Protein: 6.2 g/dL — ABNORMAL LOW (ref 6.5–8.1)

## 2021-03-10 LAB — GLUCOSE, CAPILLARY
Glucose-Capillary: 250 mg/dL — ABNORMAL HIGH (ref 70–99)
Glucose-Capillary: 238 mg/dL — ABNORMAL HIGH (ref 70–99)
Glucose-Capillary: 204 mg/dL — ABNORMAL HIGH (ref 70–99)

## 2021-03-10 LAB — MAGNESIUM: Magnesium: 2 mg/dL (ref 1.7–2.4)

## 2021-03-10 LAB — PHOSPHORUS: Phosphorus: 4.2 mg/dL (ref 2.5–4.6)

## 2021-03-10 MED ORDER — TECHNETIUM TO 99M ALBUMIN AGGREGATED
3.8000 | Freq: Once | INTRAVENOUS | Status: AC
  Administered 2021-03-10: 3.8 via INTRAVENOUS

## 2021-03-10 MED ORDER — POTASSIUM CHLORIDE CRYS ER 20 MEQ PO TBCR: EXTENDED_RELEASE_TABLET | ORAL | 0 refills | Status: DC

## 2021-03-10 MED ORDER — FUROSEMIDE 40 MG PO TABS: ORAL_TABLET | ORAL | 0 refills | Status: DC

## 2021-03-10 NOTE — Progress Notes (Signed)
D/C instructions reviewed w/ pt, pt verbalizes understanding. All questions answered, no further questions. Pt s/o unable to pick him up tonight. Transport arranged via [employer] with pick up by [facility] within the hour. Contact number [phone]. Pt aware and agreeable.

## 2021-03-10 NOTE — Discharge Summary (Signed)
Physician Discharge Summary  Jesse Lane GBT:[id number] DOB: [birth year] DOA: 03/08/2021  PCP: Jesse Rasmussen, MD  Admit date: 03/08/2021 Discharge date: 03/10/2021  Time spent: 40 minutes  Recommendations for Outpatient Follow-up:  Follow outpatient CBC/CMP Follow volume status, lytes, creatinine on lasix/potassium Follow blood pressure outpatient    Discharge Diagnoses:  Principal Problem:   Acute on chronic diastolic CHF (congestive heart failure) ([place]) Active Problems:   Acute respiratory failure with hypoxia (H[facility])   Essential hypertension   Insulin-requiring or dependent type II diabetes mellitus ([place])   CKD (chronic kidney disease), stage II   GERD (gastroesophageal reflux disease)   Discharge Condition: stable  Diet recommendation: heart healthy  Filed Weights   03/08/21 1804 03/09/21 1137 03/10/21 0418  Weight: 88.5 kg 88.5 kg 93.9 kg    History of present illness:  65 yo with hx HTN, T2DM, recent covid 19 infection who is presenting with worsening shortness of breath with findings c/w heart failure exacerbation.  Improved after diuresis.  See below for additional details  Hospital Course:  * Acute on chronic diastolic CHF (congestive heart failure) ([place]) CXR with cardiomegaly and mild interstitial pulm edema Repeat CXR with mild pulm vascular congestion, small bilateral effusions Not grossly overloaded on exam, but bibasilar crackles, CXR findings, bnp Echo 10/2019 with EF 71-06%, grade 1 diastolic dysfunction Repeat echo with grade II diastolic dysfunction, LVH, echo 60-65% (see report) Elevated BNP, normal trop Strict I/O, daily weights Discharge on oral lasix - outpatient PCP follow up  Acute respiratory failure with hypoxia (HCC) Weaned to RA In setting of heart failure D dimer mildly elevated VQ scan, LE [place] negative  Essential hypertension Continue lasix, amlodipine, lasix, nebivolol May need to add additional agents if continued  hypertension - follow outpatient   Insulin-requiring or dependent type II diabetes mellitus ([place]) Basal, SSI  CKD (chronic kidney disease), stage II Creatinine at baseline, follow with diuresis outpatient   GERD (gastroesophageal reflux disease) PPI    Procedures: LE [place] [email or web address]  Echo IMPRESSIONS     1. Left ventricular ejection fraction, by estimation, is 60 to 65%. The  left ventricle has normal function. The left ventricle has no regional  wall motion abnormalities. There is moderate left ventricular hypertrophy.  Left ventricular diastolic  parameters are consistent with Grade II diastolic dysfunction  (pseudonormalization). Elevated left atrial pressure.   2. Right ventricular systolic function is normal. The right ventricular  size is mildly enlarged.   3. Left atrial size was moderately dilated.   4. Right atrial size was mildly dilated.   5. The mitral valve is normal in structure. Trivial mitral valve  regurgitation. No evidence of mitral stenosis.   6. The aortic valve is tricuspid. Aortic valve regurgitation is not  visualized. No aortic stenosis is present.   7. The inferior vena cava is normal in size with greater than 50%  respiratory variability, suggesting right atrial pressure of 3 mmHg.   Consultations: none  Discharge Exam: Vitals:   03/10/21 1159 03/10/21 1200  BP: (!) 159/79   Pulse: (!) 53   Resp: 16   Temp: (!) 97.3 F (36.3 C)   SpO2: 96% 91%   Feels 98%   General: No acute distress. Cardiovascular: RRR Lungs: unabored Abdomen: Soft, nontender, nondistended  Neurological: Alert and oriented 3. Moves all extremities 4 . Cranial nerves II through XII grossly intact. Skin: Warm and dry. No rashes or lesions. Extremities: No clubbing or cyanosis. No edema.   Discharge Instructions  Discharge Instructions     (HEART FAILURE PATIENTS) Call MD:   Anytime you have any of the following symptoms: 1) 3 pound weight gain in 24 hours or 5 pounds in 1 week 2) shortness of breath, with or without Jesse Lane dry hacking cough 3) swelling in the hands, feet or stomach 4) if you have to sleep on extra pillows at night in order to breathe.   Complete by: As directed    Call MD for:  difficulty breathing, headache or visual disturbances   Complete by: As directed    Call MD for:  extreme fatigue   Complete by: As directed    Call MD for:  hives   Complete by: As directed    Call MD for:  persistant dizziness or light-headedness   Complete by: As directed    Call MD for:  persistant nausea and vomiting   Complete by: As directed    Call MD for:  redness, tenderness, or signs of infection (pain, swelling, redness, odor or green/yellow discharge around incision site)   Complete by: As directed    Call MD for:  severe uncontrolled pain   Complete by: As directed    Call MD for:  temperature >100.4   Complete by: As directed    Diet - low sodium heart healthy   Complete by: As directed    Discharge instructions   Complete by: As directed    You were seen for Jesse Lane heart failure exacerbation.  You've improved with lasix.  You did not have any evidence of blood clots in your imaging.  Your ultrasound of your heart showed diastolic dysfunction (stiffness) which is likely related to long term high blood pressure.    We'll send you out with lasix twice daily for Jesse Lane few days, then transition to once daily (with potassium).   Check your weight daily, if your weight increases by 3 lbs in Jesse Lane day or by 5 lbs in Jesse Lane week, take an extra dose of lasix and call you PCP for follow up instructions.  Follow repeat labs in about 1 week with your PCP (sometimes lasix can cause changes to your electrolytes and kidney function).  Return for new, recurrent, or worsening symptoms.  Please ask your PCP to request records from this hospitalization so they know what was done and  what the next steps will be.   Increase activity slowly   Complete by: As directed       Allergies as of 03/10/2021       Reactions   Contrast Media [iodinated Diagnostic Agents] Itching        Medication List     STOP taking these medications    hydrochlorothiazide 25 MG tablet Commonly known as: HYDRODIURIL       TAKE these medications    amLODipine 10 MG tablet Commonly known as: NORVASC Take 10 mg by mouth daily.   Bystolic 20 MG Tabs Generic drug: Nebivolol HCl Take 20 mg by mouth at bedtime.   Dexcom G6 Receiver Devi by Does not apply route.   Dexcom G6 Sensor Misc by Does not apply route.   Dexcom G6 Transmitter Misc by Does not apply route.   diphenhydramine-acetaminophen 25-500 MG Tabs tablet Commonly known as: TYLENOL PM Take 2 tablets by mouth at bedtime as needed (sleep).   furosemide 40 MG tablet Commonly known as: Lasix Take 1 tablet (40 mg total) by mouth 2 (two) times daily for 3 days, THEN 1 tablet (40 mg total) daily.  Start taking on: March 10, 2021   insulin lispro 100 UNIT/ML KwikPen Commonly known as: HUMALOG Inject 0-20 Units into the skin 3 (three) times daily. Per sliding scale   Insulin Pen Needle 32G X 6 MM Misc Commonly known as: BD Pen Needle Micro U/F 1 each by Other route See admin instructions. Use one pen needle to inject insulin 4 times daily. **must have appointment for additional refills.** What changed: Another medication with the same name was changed. Make sure you understand how and when to take each.   Insulin Pen Needle 32G X 6 MM Misc Commonly known as: BD Pen Needle Micro U/F USE 1 PEN NEEDLE SIX TIMES DAILY What changed:  how much to take how to take this when to take this   lisinopril 40 MG tablet Commonly known as: ZESTRIL Take 40 mg by mouth at bedtime.   montelukast 10 MG tablet Commonly known as: SINGULAIR Take 10 mg by mouth at bedtime.   multivitamin with minerals Tabs tablet Take 1  tablet by mouth daily.   ondansetron 4 MG tablet Commonly known as: ZOFRAN Take 1 tablet (4 mg total) by mouth every 6 (six) hours as needed for nausea.   pantoprazole 40 MG tablet Commonly known as: PROTONIX Take 1 tablet by mouth twice daily What changed: when to take this   potassium chloride SA 20 MEQ tablet Commonly known as: KLOR-CON M Take 1 tablet (20 mEq total) by mouth 2 (two) times daily for 3 days, THEN 1 tablet (20 mEq total) daily. Start taking on: March 10, 2021   pravastatin 20 MG tablet Commonly known as: PRAVACHOL Take 20 mg by mouth daily.   pregabalin 100 MG capsule Commonly known as: LYRICA Take 100-300 mg by mouth See admin instructions. Take 2 capsule (200 mg) by mouth every morning and take three capsules (300 mg) at bedtime   PROBIOTIC PO Take 1 tablet by mouth daily. gummies   traZODone 100 MG tablet Commonly known as: DESYREL Take 100 mg by mouth at bedtime.   Tyler Aas FlexTouch 100 UNIT/ML FlexTouch Pen Generic drug: insulin degludec Inject 24 Units into the skin daily before breakfast.       Allergies  Allergen Reactions   Contrast Media [Iodinated Diagnostic Agents] Itching      The results of significant diagnostics from this hospitalization (including imaging, microbiology, ancillary and laboratory) are listed below for reference.    Significant Diagnostic Studies: DG Chest 2 View  Result Date: 03/10/2021 CLINICAL DATA:  Shortness of breath, PE suspected. EXAM: CHEST - 2 VIEW COMPARISON:  None. FINDINGS: The heart is enlarged. Prominence of the central pulmonary vessels. Small bilateral pleural effusions. No focal consolidation. IMPRESSION: 1.  Cardiomegaly with mild pulmonary vascular congestion. 2.  Small bilateral pleural effusions. 3.  No focal consolidation. Electronically Signed   By: Keane Police D.O.   On: 03/10/2021 13:36   NM Pulmonary Perfusion  Result Date: 03/10/2021 CLINICAL DATA:  Shortness of breath EXAM: NUCLEAR  MEDICINE PERFUSION LUNG SCAN TECHNIQUE: Perfusion images were obtained in multiple projections after intravenous injection of radiopharmaceutical. Ventilation scans intentionally deferred if perfusion scan and chest x-ray adequate for interpretation during COVID 19 epidemic. RADIOPHARMACEUTICALS:  3.8 mCi Tc-47m MAA IV COMPARISON:  None. FINDINGS: No suspicious perfusion defects visualized. IMPRESSION: As above Electronically Signed   By: Ofilia Neas M.D.   On: 03/10/2021 13:36   DG Chest Port 1 View  Result Date: 03/08/2021 CLINICAL DATA:  Shortness of breath EXAM: PORTABLE CHEST 1  VIEW COMPARISON:  01/26/2021 FINDINGS: Moderate cardiomegaly with mild interstitial pulmonary edema. No pneumothorax. Suspect small pleural effusions. IMPRESSION: Cardiomegaly and mild interstitial pulmonary edema. Electronically Signed   By: Ulyses Jarred M.D.   On: 03/08/2021 21:15   ECHOCARDIOGRAM COMPLETE  Result Date: 03/09/2021    ECHOCARDIOGRAM REPORT   Patient Name:   Jesse Lane Date of Exam: 03/09/2021 Medical Rec #:  [id number]        Height:       71.0 in Accession #:    [id number]       Weight:       195.0 lb Date of Birth:  06/17/1955        BSA:          2.086 m Patient Age:    64 years         BP:           169/81 mmHg Patient Gender: M                HR:           72 bpm. Exam Location:  Inpatient Procedure: 2D Echo, Cardiac Doppler and Color Doppler Indications:    CHF  History:        Patient has prior history of Echocardiogram examinations, most                 recent 10/17/2019. Stroke; Risk Factors:Hypertension,                 Dyslipidemia and Former Smoker.  Sonographer:    Merrie Roof RDCS Referring Phys: [id number] Victor  1. Left ventricular ejection fraction, by estimation, is 60 to 65%. The left ventricle has normal function. The left ventricle has no regional wall motion abnormalities. There is moderate left ventricular hypertrophy. Left ventricular diastolic parameters are  consistent with Grade II diastolic dysfunction (pseudonormalization). Elevated left atrial pressure.  2. Right ventricular systolic function is normal. The right ventricular size is mildly enlarged.  3. Left atrial size was moderately dilated.  4. Right atrial size was mildly dilated.  5. The mitral valve is normal in structure. Trivial mitral valve regurgitation. No evidence of mitral stenosis.  6. The aortic valve is tricuspid. Aortic valve regurgitation is not visualized. No aortic stenosis is present.  7. The inferior vena cava is normal in size with greater than 50% respiratory variability, suggesting right atrial pressure of 3 mmHg. FINDINGS  Left Ventricle: Left ventricular ejection fraction, by estimation, is 60 to 65%. The left ventricle has normal function. The left ventricle has no regional wall motion abnormalities. The left ventricular internal cavity size was normal in size. There is  moderate left ventricular hypertrophy. Left ventricular diastolic parameters are consistent with Grade II diastolic dysfunction (pseudonormalization). Elevated left atrial pressure. Right Ventricle: The right ventricular size is mildly enlarged. Right ventricular systolic function is normal. Left Atrium: Left atrial size was moderately dilated. Right Atrium: Right atrial size was mildly dilated. Pericardium: There is no evidence of pericardial effusion. Mitral Valve: The mitral valve is normal in structure. Trivial mitral valve regurgitation. No evidence of mitral valve stenosis. Tricuspid Valve: The tricuspid valve is normal in structure. Tricuspid valve regurgitation is trivial. No evidence of tricuspid stenosis. Aortic Valve: The aortic valve is tricuspid. Aortic valve regurgitation is not visualized. No aortic stenosis is present. Aortic valve mean gradient measures 6.0 mmHg. Aortic valve peak gradient measures 12.7 mmHg. Aortic valve area, by VTI measures 3.28  cm.  Pulmonic Valve: The pulmonic valve was normal in  structure. Pulmonic valve regurgitation is not visualized. No evidence of pulmonic stenosis. Aorta: The aortic root is normal in size and structure. Venous: The inferior vena cava is normal in size with greater than 50% respiratory variability, suggesting right atrial pressure of 3 mmHg. IAS/Shunts: No atrial level shunt detected by color flow Doppler.  LEFT VENTRICLE PLAX 2D LVIDd:         4.80 cm   Diastology LVIDs:         3.40 cm   LV e' medial:    5.98 cm/s LV PW:         1.50 cm   LV E/e' medial:  19.1 LV IVS:        1.50 cm   LV e' lateral:   5.55 cm/s LVOT diam:     2.20 cm   LV E/e' lateral: 20.5 LV SV:         116 LV SV Index:   56 LVOT Area:     3.80 cm                           3D Volume EF:                          3D EF:        63 %                          LV EDV:       147 ml                          LV ESV:       55 ml                          LV SV:        92 ml RIGHT VENTRICLE RV Basal diam:  4.50 cm RV Mid diam:    4.20 cm LEFT ATRIUM              Index        RIGHT ATRIUM           Index LA diam:        5.40 cm  2.59 cm/m   RA Area:     26.70 cm LA Vol (A2C):   173.0 ml 82.93 ml/m  RA Volume:   86.30 ml  41.37 ml/m LA Vol (A4C):   116.0 ml 55.61 ml/m LA Biplane Vol: 144.0 ml 69.03 ml/m  AORTIC VALVE AV Area (Vmax):    2.93 cm AV Area (Vmean):   3.06 cm AV Area (VTI):     3.28 cm AV Vmax:           178.00 cm/s AV Vmean:          108.000 cm/s AV VTI:            0.353 m AV Peak Grad:      12.7 mmHg AV Mean Grad:      6.0 mmHg LVOT Vmax:         137.00 cm/s LVOT Vmean:        87.000 cm/s LVOT VTI:          0.305 m LVOT/AV VTI ratio: 0.86  AORTA Ao Root diam: 3.70 cm Ao  Asc diam:  3.30 cm MITRAL VALVE MV Area (PHT): 3.31 cm     SHUNTS MV Decel Time: 229 msec     Systemic VTI:  0.30 m MV E velocity: 114.00 cm/s  Systemic Diam: 2.20 cm MV Lealon Vanputten velocity: 65.60 cm/s MV E/Albertia Carvin ratio:  1.74 Kirk Ruths MD Electronically signed by Kirk Ruths MD Signature Date/Time: 03/09/2021/6:19:43 PM    Final     VAS [place] LOWER EXTREMITY VENOUS (DVT)  Result Date: 03/10/2021  Lower Venous DVT Study Patient Name:  Jesse Lane [facility]  Date of Exam:   03/10/2021 Medical Rec #: [id number]         Accession #:    [id number] Date of Birth: [birth year]         Patient Gender: M Patient Age:   2 years Exam Location:  [facility] Procedure:      VAS [place] LOWER EXTREMITY VENOUS (DVT) Referring Phys: Dezzie Badilla POWELL JR --------------------------------------------------------------------------------  Indications: Edema.  Risk Factors: None identified. Comparison Study: No prior studies. Performing Technologist: Oliver Hum RVT  Examination Guidelines: Dorella Laster complete evaluation includes B-mode imaging, spectral Doppler, color Doppler, and power Doppler as needed of all accessible portions of each vessel. Bilateral testing is considered an integral part of Bayley Hurn complete examination. Limited examinations for reoccurring indications may be performed as noted. The reflux portion of the exam is performed with the patient in reverse Trendelenburg.  +---------+---------------+---------+-----------+----------+--------------+ RIGHT    CompressibilityPhasicitySpontaneityPropertiesThrombus Aging +---------+---------------+---------+-----------+----------+--------------+ CFV      Full           Yes      Yes                                 +---------+---------------+---------+-----------+----------+--------------+ SFJ      Full                                                        +---------+---------------+---------+-----------+----------+--------------+ FV Prox  Full                                                        +---------+---------------+---------+-----------+----------+--------------+ FV Mid   Full                                                        +---------+---------------+---------+-----------+----------+--------------+ FV DistalFull                                                         +---------+---------------+---------+-----------+----------+--------------+ PFV      Full                                                        +---------+---------------+---------+-----------+----------+--------------+  POP      Full           Yes      Yes                                 +---------+---------------+---------+-----------+----------+--------------+ PTV      Full                                                        +---------+---------------+---------+-----------+----------+--------------+ PERO     Full                                                        +---------+---------------+---------+-----------+----------+--------------+   +---------+---------------+---------+-----------+----------+--------------+ LEFT     CompressibilityPhasicitySpontaneityPropertiesThrombus Aging +---------+---------------+---------+-----------+----------+--------------+ CFV      Full           Yes      Yes                                 +---------+---------------+---------+-----------+----------+--------------+ SFJ      Full                                                        +---------+---------------+---------+-----------+----------+--------------+ FV Prox  Full                                                        +---------+---------------+---------+-----------+----------+--------------+ FV Mid   Full                                                        +---------+---------------+---------+-----------+----------+--------------+ FV DistalFull                                                        +---------+---------------+---------+-----------+----------+--------------+ PFV      Full                                                        +---------+---------------+---------+-----------+----------+--------------+ POP      Full           Yes      Yes                                  +---------+---------------+---------+-----------+----------+--------------+  PTV      Full                                                        +---------+---------------+---------+-----------+----------+--------------+ PERO     Full                                                        +---------+---------------+---------+-----------+----------+--------------+     Summary: RIGHT: - There is no evidence of deep vein thrombosis in the lower extremity.  - No cystic structure found in the popliteal fossa.  LEFT: - There is no evidence of deep vein thrombosis in the lower extremity.  - No cystic structure found in the popliteal fossa.  *See table(s) above for measurements and observations. Electronically signed by Deitra Mayo MD on 03/10/2021 at 10:17:32 AM.    Final     Microbiology: Recent Results (from the past 240 hour(s))  Resp Panel by RT-PCR (Flu Ari Engelbrecht&B, Covid) Nasopharyngeal Swab     Status: None   Collection Time: 03/08/21  8:00 PM   Specimen: Nasopharyngeal Swab; Nasopharyngeal(NP) swabs in vial transport medium  Result Value Ref Range Status   SARS Coronavirus 2 by RT PCR NEGATIVE NEGATIVE Final    Comment: (NOTE) SARS-CoV-2 target nucleic acids are NOT DETECTED.  The SARS-CoV-2 RNA is generally detectable in upper respiratory specimens during the acute phase of infection. The lowest concentration of SARS-CoV-2 viral copies this assay can detect is 138 copies/mL. Cana Mignano negative result does not preclude SARS-Cov-2 infection and should not be used as the sole basis for treatment or other patient management decisions. Asir Bingley negative result may occur with  improper specimen collection/handling, submission of specimen other than nasopharyngeal swab, presence of viral mutation(s) within the areas targeted by this assay, and inadequate number of viral copies(<138 copies/mL). Sequoia Mincey negative result must be combined with clinical observations, patient history, and  epidemiological information. The expected result is Negative.  Fact Sheet for Patients:  EntrepreneurPulse.com.au  Fact Sheet for Healthcare Providers:  IncredibleEmployment.be  This test is no t yet approved or cleared by the [place] FDA and  has been authorized for detection and/or diagnosis of SARS-CoV-2 by FDA under an Emergency Use Authorization (EUA). This EUA will remain  in effect (meaning this test can be used) for the duration of the COVID-19 declaration under Section 564(b)(1) of the Act, 21 U.S.C.section 360bbb-3(b)(1), unless the authorization is terminated  or revoked sooner.       Influenza Novali Vollman by PCR NEGATIVE NEGATIVE Final   Influenza B by PCR NEGATIVE NEGATIVE Final    Comment: (NOTE) The Xpert Xpress SARS-CoV-2/FLU/RSV plus assay is intended as an aid in the diagnosis of influenza from Nasopharyngeal swab specimens and should not be used as Keiden Deskin sole basis for treatment. Nasal washings and aspirates are unacceptable for Xpert Xpress SARS-CoV-2/FLU/RSV testing.  Fact Sheet for Patients: EntrepreneurPulse.com.au  Fact Sheet for Healthcare Providers: IncredibleEmployment.be  This test is not yet approved or cleared by the [place] FDA and has been authorized for detection and/or diagnosis of SARS-CoV-2 by FDA under an Emergency Use Authorization (EUA). This EUA will remain in effect (meaning this test can be  used) for the duration of the COVID-19 declaration under Section 564(b)(1) of the Act, 21 U.S.C. section 360bbb-3(b)(1), unless the authorization is terminated or revoked.  Performed at [facility], [place] [address], [place], [place] [address]      Labs: Basic Metabolic Panel: Recent Labs  Lab 03/08/21 2033 03/09/21 0646 03/09/21 1815 03/10/21 0519  NA 140 138  --  134*  K 3.7 4.0  --  3.7  CL 108 105  --  100  CO2 26 24  --  30  GLUCOSE 121*  203* 480* 250*  BUN 18 19  --  23  CREATININE 1.30* 1.28*  --  1.35*  CALCIUM 8.4* 8.4*  --  8.1*  MG  --   --   --  2.0  PHOS  --   --   --  4.2   Liver Function Tests: Recent Labs  Lab 03/08/21 2033 03/10/21 0519  AST 15 11*  ALT 12 10  ALKPHOS 56 48  BILITOT 1.2 1.4*  PROT 7.1 6.2*  ALBUMIN 3.8 3.3*   No results for input(s): LIPASE, AMYLASE in the last 168 hours. No results for input(s): AMMONIA in the last 168 hours. CBC: Recent Labs  Lab 03/08/21 2033 03/10/21 0519  WBC 5.4 4.7  NEUTROABS 3.5 3.3  HGB 10.7* 10.2*  HCT 33.0* 30.9*  MCV 91.9 90.9  PLT 207 194   Cardiac Enzymes: No results for input(s): CKTOTAL, CKMB, CKMBINDEX, TROPONINI in the last 168 hours. BNP: BNP (last 3 results) Recent Labs    03/08/21 2033  BNP 683.9*    ProBNP (last 3 results) No results for input(s): PROBNP in the last 8760 hours.  CBG: Recent Labs  Lab 03/09/21 1717 03/09/21 2110 03/10/21 0740 03/10/21 1125 03/10/21 1633  GLUCAP 449* 325* 250* 238* 204*       Signed:  Fayrene Helper MD.  [facility] 03/10/2021, 10:30 PM

## 2021-03-10 NOTE — Progress Notes (Signed)
Inpatient Diabetes Program Recommendations  AACE/ADA: New Consensus Statement on Inpatient Glycemic Control (2015)  Target Ranges:  Prepandial:   less than 140 mg/dL      Peak postprandial:   less than 180 mg/dL (1-2 hours)      Critically ill patients:  140 - 180 mg/dL   Lab Results  Component Value Date   GLUCAP 325 (H) 03/09/2021   HGBA1C 5.9 (H) 01/24/2021    Review of Glycemic Control  Latest Reference Range & Units 03/09/21 07:50 03/09/21 11:45 03/09/21 17:17 03/09/21 21:10 03/10/21 07:40  Glucose-Capillary 70 - 99 mg/dL 218 (H) 321 (H) 449 (H) 325 (H) 250 (H)   Diabetes history: DM 2 Outpatient Diabetes medications: Tresiba 24, Nov 0-20 units tid  Current orders for Inpatient glycemic control:  Semglee 20 units Daily Novolog 0-20 units tid + hs  Inpatient Diabetes Program Recommendations:   Pt received 125 mg solumedrol on 11/30  -  May consider adding Novolog 3 units tid meal coverage if eating >50% of meals  Thanks,  Tama Headings RN, MSN, BC-ADM Inpatient Diabetes Coordinator Team Pager [phone] (8a-5p)

## 2021-03-10 NOTE — Plan of Care (Signed)
  Problem: Education: Goal: Knowledge of General Education information will improve Description: Including pain rating scale, medication(s)/side effects and non-pharmacologic comfort measures Outcome: Progressing   Problem: Health Behavior/Discharge Planning: Goal: Ability to manage health-related needs will improve Outcome: Progressing   Problem: Clinical Measurements: Goal: Ability to maintain clinical measurements within normal limits will improve Outcome: Progressing Goal: Will remain free from infection Outcome: Progressing Goal: Diagnostic test results will improve Outcome: Progressing Goal: Respiratory complications will improve Outcome: Progressing Note: Weaned from 5L [place] to RA.  Goal: Cardiovascular complication will be avoided Outcome: Progressing   Problem: Activity: Goal: Risk for activity intolerance will decrease Outcome: Progressing   Problem: Elimination: Goal: Will not experience complications related to bowel motility Outcome: Progressing   Problem: Pain Managment: Goal: General experience of comfort will improve Outcome: Progressing   Problem: Safety: Goal: Ability to remain free from injury will improve Outcome: Progressing

## 2021-03-10 NOTE — Plan of Care (Signed)
  Problem: Education: Goal: Knowledge of General Education information will improve Description: Including pain rating scale, medication(s)/side effects and non-pharmacologic comfort measures 03/10/2021 1734 by Kerrin Mo, RN Outcome: Adequate for Discharge 03/10/2021 1353 by Kerrin Mo, RN Outcome: Progressing   Problem: Health Behavior/Discharge Planning: Goal: Ability to manage health-related needs will improve 03/10/2021 1734 by Kerrin Mo, RN Outcome: Adequate for Discharge 03/10/2021 1353 by Kerrin Mo, RN Outcome: Progressing   Problem: Clinical Measurements: Goal: Ability to maintain clinical measurements within normal limits will improve 03/10/2021 1734 by Kerrin Mo, RN Outcome: Adequate for Discharge 03/10/2021 1353 by Kerrin Mo, RN Outcome: Progressing Goal: Will remain free from infection 03/10/2021 1734 by Kerrin Mo, RN Outcome: Adequate for Discharge 03/10/2021 1353 by Kerrin Mo, RN Outcome: Progressing Goal: Diagnostic test results will improve 03/10/2021 1734 by Kerrin Mo, RN Outcome: Adequate for Discharge 03/10/2021 1353 by Kerrin Mo, RN Outcome: Progressing Goal: Respiratory complications will improve 03/10/2021 1734 by Kerrin Mo, RN Outcome: Adequate for Discharge 03/10/2021 1353 by Kerrin Mo, RN Outcome: Progressing Note: Weaned from 5L [place] to RA.  Goal: Cardiovascular complication will be avoided 03/10/2021 1734 by Kerrin Mo, RN Outcome: Adequate for Discharge 03/10/2021 1353 by Kerrin Mo, RN Outcome: Progressing   Problem: Activity: Goal: Risk for activity intolerance will decrease 03/10/2021 1734 by Kerrin Mo, RN Outcome: Adequate for Discharge 03/10/2021 1353 by Kerrin Mo, RN Outcome: Progressing   Problem: Elimination: Goal: Will not experience complications related to bowel motility 03/10/2021 1734 by  Kerrin Mo, RN Outcome: Adequate for Discharge 03/10/2021 1353 by Kerrin Mo, RN Outcome: Progressing   Problem: Pain Managment: Goal: General experience of comfort will improve 03/10/2021 1734 by Kerrin Mo, RN Outcome: Adequate for Discharge 03/10/2021 1353 by Kerrin Mo, RN Outcome: Progressing   Problem: Safety: Goal: Ability to remain free from injury will improve 03/10/2021 1734 by Kerrin Mo, RN Outcome: Adequate for Discharge 03/10/2021 1353 by Kerrin Mo, RN Outcome: Progressing

## 2021-03-10 NOTE — Progress Notes (Signed)
   03/10/21 1155  Oxygen Therapy  SpO2 90 %  O2 Device Room Air  Patient Activity (if Appropriate) Ambulating  Mobility  Activity Ambulated in hall  Level of Assistance Contact guard assist, steadying assist  Assistive Device None;Other (Comment) (Handheld assist)  Distance Ambulated (ft) 200 ft  Mobility Ambulated with assistance in hallway  Mobility Response Tolerated well  Mobility performed by Mobility specialist  $Mobility charge 1 Mobility   Pt agreeable to mobilize this afternoon. Prior to beginning session, RN removed pt from O2. Pt ambulated about 23ft and tolerated well. No complaints. See below for O2 sat range during session. Left pt in chair with call bell at side and alarm on. RN notified of session.  Mobility Specialist - Progress Note Pre-mobility (2L O2): SpO2 98% During mobility (no O2): SpO2 89-94% Post-mobility(no O2):  SPO2 96%   [place] Specialist Acute Rehab Services Office: [phone]

## 2021-03-10 NOTE — Progress Notes (Signed)
Bilateral lower extremity venous duplex has been completed. Preliminary results can be found in CV Proc through chart review.   03/10/21 9:04 AM Jesse Lane RVT

## 2021-03-10 NOTE — TOC Initial Note (Signed)
Transition of Care [facility]) - Initial/Assessment Note    Patient Details  Name: Jesse Lane MRN: [id number] Date of Birth: Aug 20, 1955  [facility]) CM/SW Contact:    Dessa Phi, RN Phone Number: 03/10/2021, 10:02 AM  Clinical Narrative: CHF referral-not appropriate-only 2 adm/56months. Monitor for d/c needs.                  Expected Discharge Plan: Home/Self Care Barriers to Discharge: Continued Medical Work up   Patient Goals and CMS Choice Patient states their goals for this hospitalization and ongoing recovery are:: Go home CMS Medicare.gov Compare Post Acute Care list provided to:: Patient Choice offered to / list presented to : Patient  Expected Discharge Plan and Services Expected Discharge Plan: Home/Self Care   Discharge Planning Services: CM Consult   Living arrangements for the past 2 months: Single Family Home                                      Prior Living Arrangements/Services Living arrangements for the past 2 months: Single Family Home Lives with:: Spouse Patient language and need for interpreter reviewed:: Yes Do you feel safe going back to the place where you live?: Yes      Need for Family Participation in Patient Care: No (Comment) Care giver support system in place?: Yes (comment)   Criminal Activity/Legal Involvement Pertinent to Current Situation/Hospitalization: No - Comment as needed  Activities of Daily Living Home Assistive Devices/Equipment: Eyeglasses, CBG Meter ADL Screening (condition at time of admission) Patient's cognitive ability adequate to safely complete daily activities?: Yes Is the patient deaf or have difficulty hearing?: No Does the patient have difficulty seeing, even when wearing glasses/contacts?: No Does the patient have difficulty concentrating, remembering, or making decisions?: No Patient able to express need for assistance with ADLs?: Yes Does the patient have difficulty dressing or  bathing?: Yes Independently performs ADLs?: No Communication: Independent Dressing (OT): Needs assistance Is this a change from baseline?: Change from baseline, expected to last >3 days Grooming: Independent Feeding: Independent Bathing: Needs assistance Is this a change from baseline?: Change from baseline, expected to last >3 days Toileting: Needs assistance Is this a change from baseline?: Change from baseline, expected to last >3days In/Out Bed: Needs assistance Is this a change from baseline?: Change from baseline, expected to last >3 days Walks in Home: Needs assistance Is this a change from baseline?: Change from baseline, expected to last >3 days Does the patient have difficulty walking or climbing stairs?: Yes Weakness of Legs: Both Weakness of Arms/Hands: None  Permission Sought/Granted Permission sought to share information with : Case Manager Permission granted to share information with : Yes, Verbal Permission Granted  Share Information with NAME: Case manager           Emotional Assessment Appearance:: Appears stated age Attitude/Demeanor/Rapport: Gracious Affect (typically observed): Accepting Orientation: : Oriented to Self, Oriented to Place, Oriented to  Time, Oriented to Situation Alcohol / Substance Use: Not Applicable Psych Involvement: No (comment)  Admission diagnosis:  Hypoxia [R09.02] Acute on chronic diastolic CHF (congestive heart failure) (HCC) [I50.33] Acute on chronic congestive heart failure, unspecified heart failure type ([place]) [I50.9] Patient Active Problem List   Diagnosis Date Noted   GERD (gastroesophageal reflux disease) 03/09/2021   Acute on chronic diastolic CHF (congestive heart failure) ([place]) 03/08/2021   Hypokalemia 01/24/2021   Acute respiratory failure with hypoxia (  [place]) 01/24/2021   Pneumonia due to COVID-19 virus 01/23/2021   CKD (chronic kidney disease), stage II 08/27/2020   Hypertensive urgency 08/27/2020   Gastroparesis  due to secondary diabetes ([place]) 08/27/2020   Stroke ([place]) 10/16/2019   Multinodular goiter 12/27/2016   Insulin-requiring or dependent type II diabetes mellitus ([place]) 11/26/2016   Protein-calorie malnutrition, severe 09/17/2016   DKA (diabetic ketoacidoses) 09/17/2016   Diabetic ketoacidosis without coma associated with type 2 diabetes mellitus ([place])    Fecal impaction in rectum ([place])    Hyperglycemia 09/16/2016   Hyperglycemic hyperosmolar nonketotic coma ([place]) 09/16/2016   High anion gap metabolic acidosis 16/01/9603   Lactic acidosis 09/16/2016   Anemia of chronic disease 09/16/2016   Diabetic neuropathy ([place]) 09/06/2016   Essential hypertension 09/06/2016   Malignant melanoma of axilla ([place]) 08/12/2016   PCP:  [name], MD Pharmacy:   [facility], [place] [address] Phone: [phone] Fax: [phone]     Social Determinants of Health (SDOH) Interventions    Readmission Risk Interventions Readmission Risk Prevention Plan 03/10/2021  Transportation Screening Complete  HRI or [place] Complete  Social Work Consult for [place] Planning/Counseling Complete  Palliative Care Screening Complete  Medication Review Press photographer) Complete  Some recent data might be hidden

## 2021-04-09 DIAGNOSIS — I219 Acute myocardial infarction, unspecified: Secondary | ICD-10-CM | POA: Insufficient documentation

## 2021-04-12 ENCOUNTER — Other Ambulatory Visit: Payer: Self-pay

## 2021-04-12 ENCOUNTER — Emergency Department (HOSPITAL_COMMUNITY): Payer: Medicare Other

## 2021-04-12 ENCOUNTER — Inpatient Hospital Stay (HOSPITAL_COMMUNITY)
Admission: EM | Admit: 2021-04-12 | Discharge: 2021-04-15 | DRG: 291 | Disposition: A | Discharge status: Home / Self Care | Payer: Medicare Other | Attending: Family Medicine | Admitting: Family Medicine

## 2021-04-12 ENCOUNTER — Encounter (HOSPITAL_COMMUNITY): Payer: Self-pay

## 2021-04-12 DIAGNOSIS — J9811 Atelectasis: Secondary | ICD-10-CM | POA: Diagnosis present

## 2021-04-12 DIAGNOSIS — I130 Hypertensive heart and chronic kidney disease with heart failure and stage 1 through stage 4 chronic kidney disease, or unspecified chronic kidney disease: Secondary | ICD-10-CM | POA: Diagnosis present

## 2021-04-12 DIAGNOSIS — Z794 Long term (current) use of insulin: Secondary | ICD-10-CM

## 2021-04-12 DIAGNOSIS — I10 Essential (primary) hypertension: Secondary | ICD-10-CM | POA: Diagnosis present

## 2021-04-12 DIAGNOSIS — E1122 Type 2 diabetes mellitus with diabetic chronic kidney disease: Secondary | ICD-10-CM | POA: Diagnosis present

## 2021-04-12 DIAGNOSIS — N179 Acute kidney failure, unspecified: Secondary | ICD-10-CM

## 2021-04-12 DIAGNOSIS — Z79899 Other long term (current) drug therapy: Secondary | ICD-10-CM | POA: Diagnosis not present

## 2021-04-12 DIAGNOSIS — E119 Type 2 diabetes mellitus without complications: Secondary | ICD-10-CM

## 2021-04-12 DIAGNOSIS — E785 Hyperlipidemia, unspecified: Secondary | ICD-10-CM | POA: Diagnosis present

## 2021-04-12 DIAGNOSIS — I5033 Acute on chronic diastolic (congestive) heart failure: Secondary | ICD-10-CM | POA: Diagnosis present

## 2021-04-12 DIAGNOSIS — J9601 Acute respiratory failure with hypoxia: Secondary | ICD-10-CM | POA: Diagnosis present

## 2021-04-12 DIAGNOSIS — D631 Anemia in chronic kidney disease: Secondary | ICD-10-CM | POA: Diagnosis present

## 2021-04-12 DIAGNOSIS — I509 Heart failure, unspecified: Secondary | ICD-10-CM | POA: Diagnosis present

## 2021-04-12 DIAGNOSIS — N1831 Chronic kidney disease, stage 3a: Secondary | ICD-10-CM | POA: Diagnosis present

## 2021-04-12 DIAGNOSIS — K219 Gastro-esophageal reflux disease without esophagitis: Secondary | ICD-10-CM | POA: Diagnosis present

## 2021-04-12 DIAGNOSIS — E1142 Type 2 diabetes mellitus with diabetic polyneuropathy: Secondary | ICD-10-CM | POA: Diagnosis present

## 2021-04-12 DIAGNOSIS — Z20822 Contact with and (suspected) exposure to covid-19: Secondary | ICD-10-CM | POA: Diagnosis present

## 2021-04-12 DIAGNOSIS — Z9114 Patient's other noncompliance with medication regimen: Secondary | ICD-10-CM

## 2021-04-12 DIAGNOSIS — Z87891 Personal history of nicotine dependence: Secondary | ICD-10-CM

## 2021-04-12 DIAGNOSIS — E11649 Type 2 diabetes mellitus with hypoglycemia without coma: Secondary | ICD-10-CM | POA: Diagnosis not present

## 2021-04-12 DIAGNOSIS — Z91041 Radiographic dye allergy status: Secondary | ICD-10-CM | POA: Diagnosis not present

## 2021-04-12 DIAGNOSIS — Z833 Family history of diabetes mellitus: Secondary | ICD-10-CM | POA: Diagnosis not present

## 2021-04-12 LAB — CBC WITH DIFFERENTIAL/PLATELET
Abs Immature Granulocytes: 0.01 10*3/uL (ref 0.00–0.07)
Basophils Absolute: 0 10*3/uL (ref 0.0–0.1)
Basophils Relative: 0 %
Eosinophils Absolute: 0 10*3/uL (ref 0.0–0.5)
Eosinophils Relative: 0 %
HCT: 31.6 % — ABNORMAL LOW (ref 39.0–52.0)
Hemoglobin: 10.4 g/dL — ABNORMAL LOW (ref 13.0–17.0)
Immature Granulocytes: 0 %
Lymphocytes Relative: 19 %
Lymphs Abs: 1 10*3/uL (ref 0.7–4.0)
MCH: 30.4 pg (ref 26.0–34.0)
MCHC: 32.9 g/dL (ref 30.0–36.0)
MCV: 92.4 fL (ref 80.0–100.0)
Monocytes Absolute: 0.4 10*3/uL (ref 0.1–1.0)
Monocytes Relative: 7 %
Neutro Abs: 3.9 10*3/uL (ref 1.7–7.7)
Neutrophils Relative %: 74 %
Platelets: 176 10*3/uL (ref 150–400)
RBC: 3.42 MIL/uL — ABNORMAL LOW (ref 4.22–5.81)
RDW: 14.5 % (ref 11.5–15.5)
WBC: 5.4 10*3/uL (ref 4.0–10.5)
nRBC: 0 % (ref 0.0–0.2)

## 2021-04-12 LAB — COMPREHENSIVE METABOLIC PANEL
ALT: 11 U/L (ref 0–44)
AST: 13 U/L — ABNORMAL LOW (ref 15–41)
Albumin: 3.7 g/dL (ref 3.5–5.0)
Alkaline Phosphatase: 62 U/L (ref 38–126)
Anion gap: 3 — ABNORMAL LOW (ref 5–15)
BUN: 30 mg/dL — ABNORMAL HIGH (ref 8–23)
CO2: 24 mmol/L (ref 22–32)
Calcium: 8.1 mg/dL — ABNORMAL LOW (ref 8.9–10.3)
Chloride: 109 mmol/L (ref 98–111)
Creatinine, Ser: 1.64 mg/dL — ABNORMAL HIGH (ref 0.61–1.24)
GFR, Estimated: 46 mL/min — ABNORMAL LOW (ref >60)
Glucose, Bld: 136 mg/dL — ABNORMAL HIGH (ref 70–99)
Potassium: 4.4 mmol/L (ref 3.5–5.1)
Sodium: 136 mmol/L (ref 135–145)
Total Bilirubin: 1.2 mg/dL (ref 0.3–1.2)
Total Protein: 6.7 g/dL (ref 6.5–8.1)

## 2021-04-12 LAB — BRAIN NATRIURETIC PEPTIDE: B Natriuretic Peptide: 741.4 pg/mL — ABNORMAL HIGH (ref 0.0–100.0)

## 2021-04-12 LAB — RESP PANEL BY RT-PCR (FLU A&B, COVID) ARPGX2
Influenza A by PCR: NEGATIVE
Influenza B by PCR: NEGATIVE
SARS Coronavirus 2 by RT PCR: NEGATIVE

## 2021-04-12 MED ORDER — METHYLPREDNISOLONE SODIUM SUCC 125 MG IJ SOLR
125.0000 mg | Freq: Once | INTRAMUSCULAR | Status: AC
  Administered 2021-04-12: 125 mg via INTRAVENOUS
  Filled 2021-04-12: qty 2

## 2021-04-12 MED ORDER — ALBUTEROL SULFATE HFA 108 (90 BASE) MCG/ACT IN AERS
2.0000 | INHALATION_SPRAY | RESPIRATORY_TRACT | Status: DC | PRN
  Administered 2021-04-12: 08:00:00 2 via RESPIRATORY_TRACT
  Filled 2021-04-12: qty 6.7

## 2021-04-12 MED ORDER — FUROSEMIDE 10 MG/ML IJ SOLN
40.0000 mg | Freq: Once | INTRAMUSCULAR | Status: AC
  Administered 2021-04-12: 40 mg via INTRAVENOUS
  Filled 2021-04-12: qty 4

## 2021-04-12 NOTE — TOC Initial Note (Signed)
Transition of Care [facility]) - Initial/Assessment Note    Patient Details  Name: Jesse Lane MRN: [id number] Date of Birth: July 18, 1955  Transition of Care [facility]) CM/SW Contact:    Joanne Chars, LCSW Phone Number: 04/12/2021, 6:35 PM  Clinical Narrative:    CSW met with pt to complete initial assessment.  Pt lives with his girlfriend Jesse Lane and another roommate.  Permission given to speak with Jesse Lane and also with pt sister, Jesse Lane. No current services at home.  Pt reports this is his 4th hospitalization at [facility] in the past 6 months and he "knows how this works."  Pt is not on home oxygen.  Pt has not needed any DME from his past admissions.  PCP in place.  Pt reports he is vaccinated for covid with one booster.  CSW informed pt that [facility] staff will follow up with him on the floor for any discharge needs.                 Expected Discharge Plan:  (TBD) Barriers to Discharge: Continued Medical Work up   Patient Goals and CMS Choice Patient states their goals for this hospitalization and ongoing recovery are:: "get around better"      Expected Discharge Plan and Services Expected Discharge Plan:  (TBD) In-house Referral: Clinical Social Work   Post Acute Care Choice:  (TBD) Living arrangements for the past 2 months: Single Family Home                                      Prior Living Arrangements/Services Living arrangements for the past 2 months: Single Family Home Lives with:: Significant Other, Friends Patient language and need for interpreter reviewed:: Yes Do you feel safe going back to the place where you live?: Yes      Need for Family Participation in Patient Care: No (Comment) Care giver support system in place?: Yes (comment) Current home services: Other (comment) (none) Criminal Activity/Legal Involvement Pertinent to Current Situation/Hospitalization: No - Comment as needed  Activities of Daily Living      Permission Sought/Granted Permission  sought to share information with : Family Supports Permission granted to share information with : Yes, Verbal Permission Granted  Share Information with NAME: girlfriend Jesse Lane, sister Jesse Lane           Emotional Assessment Appearance:: Appears stated age Attitude/Demeanor/Rapport: Engaged Affect (typically observed): Appropriate, Pleasant Orientation: :  (not recorded, appears oriented) Alcohol / Substance Use: Not Applicable Psych Involvement: No (comment)  Admission diagnosis:  Acute on chronic heart failure ([place]) [I50.9] Patient Active Problem List   Diagnosis Date Noted   Acute on chronic heart failure ([place]) 04/12/2021   GERD (gastroesophageal reflux disease) 03/09/2021   Acute on chronic diastolic CHF (congestive heart failure) ([place]) 03/08/2021   Hypokalemia 01/24/2021   Acute respiratory failure with hypoxia ([place]) 01/24/2021   Pneumonia due to COVID-19 virus 01/23/2021   CKD (chronic kidney disease), stage II 08/27/2020   Hypertensive urgency 08/27/2020   Gastroparesis due to secondary diabetes ([place]) 08/27/2020   Stroke ([place]) 10/16/2019   Multinodular goiter 12/27/2016   Insulin-requiring or dependent type II diabetes mellitus ([place]) 11/26/2016   Protein-calorie malnutrition, severe 09/17/2016   DKA (diabetic ketoacidoses) 09/17/2016   Diabetic ketoacidosis without coma associated with type 2 diabetes mellitus ([place])    Fecal impaction in rectum ([place])    Hyperglycemia 09/16/2016   Hyperglycemic hyperosmolar nonketotic coma ([place])  09/16/2016   High anion gap metabolic acidosis 81/18/8677   Lactic acidosis 09/16/2016   Anemia of chronic disease 09/16/2016   Diabetic neuropathy ([place]) 09/06/2016   Essential hypertension 09/06/2016   Malignant melanoma of axilla ([place]) 08/12/2016   PCP:  [name], MD Pharmacy:   [facility], [place] [address] Phone: [phone] Fax:  [phone]     Social Determinants of Health (SDOH) Interventions    Readmission Risk Interventions Readmission Risk Prevention Plan 03/10/2021  Transportation Screening Complete  HRI or Home Care Consult Complete  Social Work Consult for [place] Planning/Counseling Complete  Palliative Care Screening Complete  Medication Review Press photographer) Complete  Some recent data might be hidden

## 2021-04-12 NOTE — H&P (Signed)
History and Physical    Jesse Lane DOB: 01-18-1956 DOA: 04/12/2021  PCP: [name], MD  Patient coming from: Home  Chief Complaint: shortness of breath and wheeze  HPI: Jesse Lane is a 66 y.o. male with medical history significant of HFpEF, HTN, HLD, DM2. Presenting with dyspnea. Symptoms started last night with cough. He states that family noted his face was red. He was able to go to sleep in relative comfort. However, he woke this morning around 4a with acute shortness of breath and wheeze. He became concerned and called for EMS. He denies any other aggravating or alleviating factors.    ED Course: He was noted to have and elevate BNP. He was hypoxic on RA. He was placed on 4L [place]. Clinical concern for acute HF. Started on lasix. [facility] was called for admission.   Review of Systems:  Denies CP, palpitations, abdominal pain, lightheadedness, dizziness, N/V/D, fever, sick contacts. Review of systems is otherwise negative for all not mentioned in HPI.   PMHx Past Medical History:  Diagnosis Date   Arthritis    Diabetes mellitus type 2, uncontrolled    Diabetic neuropathy (HCC)    GERD (gastroesophageal reflux disease)    History of stomach ulcers    Hypertension    Melanoma ([place])    mets to lymph node and intestines    PSHx Past Surgical History:  Procedure Laterality Date   ABCESS DRAINAGE     gluteal area   COLONOSCOPY     INGUINAL HERNIA REPAIR N/A 08/15/2015   Procedure: HERNIA REPAIR INGUINAL INCARCERATED;  Surgeon: Alphonsa Overall, MD;  Location: [facility];  Service: General;  Laterality: N/A;  with MESH   pyloric stenosis     TONSILLECTOMY      SocHx  reports that he has quit smoking. His smoking use included cigarettes. He has a 5.00 pack-year smoking history. He has never used smokeless tobacco. He reports current alcohol use of about 2.0 standard drinks per week. He reports that he does not currently use drugs after having used the following  drugs: Marijuana.  Allergies  Allergen Reactions   Contrast Media [Iodinated Contrast Media] Itching    FamHx Family History  Problem Relation Age of Onset   Breast cancer Mother    Bone cancer Mother    Melanoma Mother    Prostate cancer Father    Pancreatic cancer Maternal Grandfather    Diabetes Paternal Aunt    Colon cancer Neg Hx    Colon polyps Neg Hx    Esophageal cancer Neg Hx    Stomach cancer Neg Hx     Prior to Admission medications   Medication Sig Start Date End Date Taking? Authorizing Provider  alendronate (FOSAMAX) 70 MG tablet Take 70 mg by mouth once a week. Wednesday's 04/09/21  Yes [provider]  amLODipine (NORVASC) 10 MG tablet Take 10 mg by mouth daily. 11/28/19  Yes [provider]  Brimonidine Tartrate (LUMIFY) 0.025 % SOLN Place 1 drop into both eyes 2 (two) times daily as needed (irritated/dry eyes).   Yes [provider]  furosemide (LASIX) 40 MG tablet Take 1 tablet (40 mg total) by mouth 2 (two) times daily for 3 days, THEN 1 tablet (40 mg total) daily. Patient taking differently: 40 mg daily 03/10/21 04/12/21 Yes Elodia Florence., MD  insulin degludec (TRESIBA FLEXTOUCH) 100 UNIT/ML FlexTouch Pen Inject 25 Units into the skin daily before breakfast.   Yes [provider]  insulin lispro (HUMALOG) 100 UNIT/ML KwikPen Inject 7-15 Units into the skin See admin instructions. 7 units in the morning, 7 units at lunch, and 8 units in the evening. May take additional units as needed, max of 50 units per day   Yes [provider]  lisinopril (PRINIVIL,ZESTRIL) 40 MG tablet Take 40 mg by mouth at bedtime.  07/24/16  Yes [provider]  montelukast (SINGULAIR) 10 MG tablet Take 10 mg by mouth at bedtime. 10/13/19  Yes [provider]  Multiple Vitamin (MULTIVITAMIN WITH MINERALS) TABS tablet Take 1 tablet by mouth daily.   Yes [provider]  Nebivolol HCl (BYSTOLIC) 20 MG TABS Take 20 mg by  mouth at bedtime.   Yes [provider]  ondansetron (ZOFRAN) 4 MG tablet Take 1 tablet (4 mg total) by mouth every 6 (six) hours as needed for nausea. 09/21/20  Yes Levin Erp, PA  pantoprazole (PROTONIX) 40 MG tablet Take 1 tablet by mouth twice daily Patient taking differently: Take 40 mg by mouth daily as needed (heartburn). 05/04/20  Yes Milus Banister, MD  potassium chloride SA (KLOR-CON M) 20 MEQ tablet Take 1 tablet (20 mEq total) by mouth 2 (two) times daily for 3 days, THEN 1 tablet (20 mEq total) daily. Patient taking differently: 20 mEq daily 03/10/21 04/12/21 Yes Elodia Florence., MD  pravastatin (PRAVACHOL) 20 MG tablet Take 20 mg by mouth daily. 11/05/20  Yes [provider]  pregabalin (LYRICA) 100 MG capsule Take 200-300 mg by mouth See admin instructions. Take 2 capsule (200 mg) by mouth every morning and take three capsules (300 mg) at bedtime 10/15/19  Yes [provider]  traZODone (DESYREL) 100 MG tablet Take 100 mg by mouth at bedtime. 07/24/16  Yes [provider]  Insulin Pen Needle (BD PEN NEEDLE MICRO U/F) 32G X 6 MM MISC 1 each by Other route See admin instructions. Use one pen needle to inject insulin 4 times daily. **must have appointment for additional refills.** 06/30/18   Renato Shin, MD  Insulin Pen Needle (BD PEN NEEDLE MICRO U/F) 32G X 6 MM MISC USE 1 PEN NEEDLE SIX TIMES DAILY Patient taking differently: 1 each by Other route See admin instructions. USE 1 PEN NEEDLE SIX TIMES DAILY 07/02/18   Renato Shin, MD    Physical Exam: Vitals:   04/12/21 1030 04/12/21 1100 04/12/21 1115 04/12/21 1130  BP: (!) 147/80 (!) 157/80  (!) 153/76  Pulse: (!) 59 (!) 59 (!) 58 (!) 58  Resp: 19 18  19   Temp:      SpO2: 91% 93% 90% 91%  Weight:      Height:        General: 66 y.o. male resting in bed in NAD Eyes: PERRL, normal sclera ENMT: Nares patent w/o discharge, orophaynx clear, dentition normal, ears w/o  discharge/lesions/ulcers Neck: Supple, trachea midline Cardiovascular: RRR, +S1, S2, no m/g/r, equal pulses throughout Respiratory: decreased at bases, slightly increased WOB on 3L [place] GI: BS+, NDNT, no masses noted, no organomegaly noted MSK: No c/c; BLE 2+ edema Neuro: A&O x 3, no focal deficits Psyc: Appropriate interaction and affect, calm/cooperative  Labs on Admission: I have personally reviewed following labs and imaging studies  CBC: Recent Labs  Lab 04/12/21 0700  WBC 5.4  NEUTROABS 3.9  HGB 10.4*  HCT 31.6*  MCV 92.4  PLT 256   Basic Metabolic Panel: Recent Labs  Lab 04/12/21 0700  NA 136  K 4.4  CL  109  CO2 24  GLUCOSE 136*  BUN 30*  CREATININE 1.64*  CALCIUM 8.1*   GFR: Estimated Creatinine Clearance: 51.1 mL/min (A) (by C-G formula based on SCr of 1.64 mg/dL (H)). Liver Function Tests: Recent Labs  Lab 04/12/21 0700  AST 13*  ALT 11  ALKPHOS 62  BILITOT 1.2  PROT 6.7  ALBUMIN 3.7   No results for input(s): LIPASE, AMYLASE in the last 168 hours. No results for input(s): AMMONIA in the last 168 hours. Coagulation Profile: No results for input(s): INR, PROTIME in the last 168 hours. Cardiac Enzymes: No results for input(s): CKTOTAL, CKMB, CKMBINDEX, TROPONINI in the last 168 hours. BNP (last 3 results) No results for input(s): PROBNP in the last 8760 hours. HbA1C: No results for input(s): HGBA1C in the last 72 hours. CBG: No results for input(s): GLUCAP in the last 168 hours. Lipid Profile: No results for input(s): CHOL, HDL, LDLCALC, TRIG, CHOLHDL, LDLDIRECT in the last 72 hours. Thyroid Function Tests: No results for input(s): TSH, T4TOTAL, FREET4, T3FREE, THYROIDAB in the last 72 hours. Anemia Panel: No results for input(s): VITAMINB12, FOLATE, FERRITIN, TIBC, IRON, RETICCTPCT in the last 72 hours. Urine analysis:    Component Value Date/Time   COLORURINE YELLOW 08/27/2020 1930   APPEARANCEUR CLEAR 08/27/2020 1930   LABSPEC 1.023  08/27/2020 1930   PHURINE 5.0 08/27/2020 1930   GLUCOSEU 50 (A) 08/27/2020 1930   HGBUR MODERATE (A) 08/27/2020 1930   BILIRUBINUR NEGATIVE 08/27/2020 1930   KETONESUR NEGATIVE 08/27/2020 1930   PROTEINUR >=300 (A) 08/27/2020 1930   UROBILINOGEN 0.2 12/05/2011 1655   NITRITE NEGATIVE 08/27/2020 1930   LEUKOCYTESUR NEGATIVE 08/27/2020 1930    Radiological Exams on Admission: CT Chest Wo Contrast  Result Date: 04/12/2021 CLINICAL DATA:  Dyspnea.  Evaluate for pneumonia. EXAM: CT CHEST WITHOUT CONTRAST TECHNIQUE: Multidetector CT imaging of the chest was performed following the standard protocol without IV contrast. COMPARISON:  10/06/2020 FINDINGS: Cardiovascular: Heart size upper normal to mildly enlarged. No substantial pericardial effusion. Coronary artery calcification is evident. Mild atherosclerotic calcification is noted in the wall of the thoracic aorta. Enlargement of the pulmonary outflow tract and main pulmonary arteries suggests pulmonary arterial hypertension. Mediastinum/Nodes: Upper normal lymph nodes are seen scattered in the mediastinum. No evidence for gross hilar lymphadenopathy although assessment is limited by the lack of intravenous contrast on the current study. The esophagus has normal imaging features. Similar upper normal right axillary lymph nodes measuring up to 8-9 mm short axis. Lungs/Pleura: Bilateral lower lobe collapse/consolidation evident with small to moderate bilateral pleural effusions. No suspicious pulmonary nodule or mass within the aerated lung. Upper Abdomen: Unremarkable. Musculoskeletal: No worrisome lytic or sclerotic osseous abnormality. IMPRESSION: 1. Bilateral lower lobe collapse/consolidation with small to moderate bilateral pleural effusions. 2. Enlargement of the pulmonary outflow tract and main pulmonary arteries suggests pulmonary arterial hypertension. 3. Aortic Atherosclerosis (ICD10-I70.0). Electronically Signed   By: Misty Stanley M.D.   On:  04/12/2021 09:21   DG Chest Portable 1 View  Result Date: 04/12/2021 CLINICAL DATA:  Hypertension, shortness of breath, former smoker EXAM: PORTABLE CHEST 1 VIEW COMPARISON:  03/10/2021 FINDINGS: Heart is enlarged with mid and lower lung asymmetric airspace opacities, worse on the right favored to represent edema over pneumonia. There is associated bibasilar atelectasis. No large effusion or pneumothorax. Trachea midline. Degenerative changes of the spine. IMPRESSION: Cardiomegaly with mid and lower lung asymmetric edema pattern versus pneumonia. Bibasilar atelectasis. Electronically Signed   By: Jerilynn Mages.  Shick M.D.   On:  04/12/2021 07:48    EKG: Independently reviewed. Sinus, no st elevations  Assessment/Plan Acute on chronic diastolic HF Acute hypoxic respiratory failure     - admit to inpt, tele     - lasix 60mg  IV BID     - daily wt, I&O, fluid restriction (1500cc)     - trend trp; EKG is w/o st elevations     - wean O2 as able  AKI     - hold ACEi d/t AKI     - check renal US     - watch nephrotoxins  HTN     - resume home regimen accept for ACEi  DM2     - A1c, SS1, glucose checks, DM diet  Normocytic anemia     - at baseline, no evidence of bleed, follow  HLD     - resume home statin  DVT prophylaxis: lovenox Code Status: FULL  Family Communication: None at bedside  Consults called: None   Status is: Inpatient  Remains inpatient appropriate because: severity of illness  Jonnie Finner DO [facility]  If 7PM-7AM, please contact night-coverage [email or web address]  04/12/2021, 12:59 PM

## 2021-04-12 NOTE — ED Provider Notes (Signed)
[facility] DEPT Provider Note   CSN: [id number] Arrival date & time: 04/12/21  3151     History  Chief Complaint  Patient presents with   Shortness of Breath    Jesse Lane is a 66 y.o. male.  Patient with history of CHF presents today with chief complaint of shortness of breath.  He states that 2 days ago he went out drinking with his friends, woke up yesterday morning feeling poorly, however attributed this to significant alcohol intake the day before.  Began to develop cough and some shortness of breath last night, hoped that he would feel better in the morning, however awoke this morning significantly more short of breath with dry cough. Was found to be in the 84s on room air by EMS. He is not on oxygen at home. Of note, patient was admitted 3 months ago for COVID and then subsequently 1 month ago with new CHF exacerbation sent home on Lasix. He is unsure if he is taking his Lasix, however states that he has been urinating much less in the past few days from when he first started on Lasix.  The history is provided by the patient. No language interpreter was used.  Shortness of Breath Associated symptoms: cough and wheezing   Associated symptoms: no abdominal pain, no chest pain, no fever and no vomiting       Home Medications Prior to Admission medications   Medication Sig Start Date End Date Taking? Authorizing Provider  amLODipine (NORVASC) 10 MG tablet Take 10 mg by mouth daily. 11/28/19   [provider]  Continuous Blood Gluc Receiver ([place]) [place] by Does not apply route.    [provider]  Continuous Blood Gluc Sensor (DEXCOM G6 SENSOR) MISC by Does not apply route.    [provider]  Continuous Blood Gluc Transmit (DEXCOM G6 TRANSMITTER) MISC by Does not apply route.    [provider]  diphenhydramine-acetaminophen (TYLENOL PM) 25-500 MG TABS tablet Take 2 tablets by mouth at bedtime as  needed (sleep).    [provider]  furosemide (LASIX) 40 MG tablet Take 1 tablet (40 mg total) by mouth 2 (two) times daily for 3 days, THEN 1 tablet (40 mg total) daily. 03/10/21 04/12/21  Elodia Florence., MD  insulin degludec (TRESIBA FLEXTOUCH) 100 UNIT/ML FlexTouch Pen Inject 24 Units into the skin daily before breakfast.    [provider]  insulin lispro (HUMALOG) 100 UNIT/ML KwikPen Inject 0-20 Units into the skin 3 (three) times daily. Per sliding scale    [provider]  Insulin Pen Needle (BD PEN NEEDLE MICRO U/F) 32G X 6 MM MISC 1 each by Other route See admin instructions. Use one pen needle to inject insulin 4 times daily. **must have appointment for additional refills.** 06/30/18   Renato Shin, MD  Insulin Pen Needle (BD PEN NEEDLE MICRO U/F) 32G X 6 MM MISC USE 1 PEN NEEDLE SIX TIMES DAILY Patient taking differently: 1 each by Other route See admin instructions. USE 1 PEN NEEDLE SIX TIMES DAILY 07/02/18   Renato Shin, MD  lisinopril (PRINIVIL,ZESTRIL) 40 MG tablet Take 40 mg by mouth at bedtime.  07/24/16   [provider]  montelukast (SINGULAIR) 10 MG tablet Take 10 mg by mouth at bedtime. 10/13/19   [provider]  Multiple Vitamin (MULTIVITAMIN WITH MINERALS) TABS tablet Take 1 tablet by mouth daily.    [provider]  Nebivolol HCl (BYSTOLIC) 20 MG  TABS Take 20 mg by mouth at bedtime.    [provider]  ondansetron (ZOFRAN) 4 MG tablet Take 1 tablet (4 mg total) by mouth every 6 (six) hours as needed for nausea. 09/21/20   Levin Erp, PA  pantoprazole (PROTONIX) 40 MG tablet Take 1 tablet by mouth twice daily Patient taking differently: Take 40 mg by mouth daily. 05/04/20   Milus Banister, MD  potassium chloride SA (KLOR-CON M) 20 MEQ tablet Take 1 tablet (20 mEq total) by mouth 2 (two) times daily for 3 days, THEN 1 tablet (20 mEq total) daily. 03/10/21 04/12/21  Elodia Florence., MD  pravastatin  (PRAVACHOL) 20 MG tablet Take 20 mg by mouth daily. 11/05/20   [provider]  pregabalin (LYRICA) 100 MG capsule Take 100-300 mg by mouth See admin instructions. Take 2 capsule (200 mg) by mouth every morning and take three capsules (300 mg) at bedtime 10/15/19   [provider]  Probiotic Product (PROBIOTIC PO) Take 1 tablet by mouth daily. gummies    [provider]  traZODone (DESYREL) 100 MG tablet Take 100 mg by mouth at bedtime. 07/24/16   [provider]      Allergies    Contrast media [iodinated contrast media]    Review of Systems   Review of Systems  Constitutional:  Negative for chills and fever.  HENT:  Positive for congestion.   Respiratory:  Positive for cough, shortness of breath and wheezing. Negative for apnea, choking, chest tightness and stridor.   Cardiovascular:  Positive for leg swelling. Negative for chest pain and palpitations.  Gastrointestinal:  Negative for abdominal pain, diarrhea, nausea and vomiting.  All other systems reviewed and are negative.  Physical Exam Updated Vital Signs BP (!) 148/71    Pulse 62    Temp 98 F (36.7 C)    Resp 19    Ht 5\' 11"  (1.803 m)    Wt 90.7 kg    SpO2 93%    BMI 27.89 kg/m  Physical Exam Vitals and nursing note reviewed.  Constitutional:      Comments: Patient sitting upright in bed somewhat ill appearing in no acute distress  HENT:     Head: Normocephalic and atraumatic.  Cardiovascular:     Rate and Rhythm: Normal rate and regular rhythm.  Pulmonary:     Effort: Pulmonary effort is normal.     Breath sounds: Examination of the right-lower field reveals wheezing. Examination of the left-lower field reveals wheezing. Wheezing present.     Comments: Assessed on 5L [place] Abdominal:     Palpations: Abdomen is soft.  Musculoskeletal:     Right lower leg: No tenderness.     Left lower leg: No tenderness.     Comments: 2+ pitting edema in bilateral lower extremities  Skin:    General:  Skin is warm and dry.  Neurological:     General: No focal deficit present.  Psychiatric:        Mood and Affect: Mood normal.        Behavior: Behavior normal.    ED Results / Procedures / Treatments   Labs (all labs ordered are listed, but only abnormal results are displayed) Labs Reviewed  RESP PANEL BY RT-PCR (FLU A&B, COVID) [id number]  CBC WITH DIFFERENTIAL/PLATELET  COMPREHENSIVE METABOLIC PANEL  BRAIN NATRIURETIC PEPTIDE    EKG None  Radiology No results found.  Procedures Procedures    Medications Ordered in ED Medications  albuterol (VENTOLIN  HFA) 108 (90 Base) MCG/ACT inhaler 2 puff (has no administration in time range)  methylPREDNISolone sodium succinate (SOLU-MEDROL) 125 mg/2 mL injection 125 mg (has no administration in time range)    ED Course/ Medical Decision Making/ A&P                           Medical Decision Making  This patient presents to the ED for concern of respiratory distress, this involves an extensive number of treatment options, and is a complaint that carries with it a high risk of complications and morbidity.  The differential diagnosis includes CHF, pneumonia, URI   Co morbidities that complicate the patient evaluation  CHF, htn, diabetes    Lab Tests:  I Ordered, and personally interpreted labs.  The pertinent results include:  no leukocytosis, anemia 10.4 per baseline. Creatinine elevated from 1.35 to 1.64. BNP elevated at 741.4. COVID and flu negative.   Imaging Studies ordered:  I ordered imaging studies including CXR and CT chest  I independently visualized and interpreted imaging which showed Cardiomegaly with mid and lower lung asymmetric edema pattern versus pneumonia. CT chest: 1. Bilateral lower lobe collapse/consolidation with small to moderate bilateral pleural effusions. 2. Enlargement of the pulmonary outflow tract and main pulmonary arteries suggests pulmonary arterial hypertension. I agree with the  radiologist interpretation   Cardiac Monitoring:  The patient was maintained on a cardiac monitor.  I personally viewed and interpreted the cardiac monitored which showed an underlying rhythm of: Sinus rhythm   Medicines ordered and prescription drug management:  I ordered medication including nebulizer and solu-medrol  for wheezing, Lasix for likely CHF exacerbation  Reevaluation of the patient after these medicines showed that the patient improved I have reviewed the patients home medicines and have made adjustments as needed    Dispostion:  After consideration of the diagnostic results and the patients response to treatment, I feel that the patent would benefit from inpatient management of CHF exacerbation in the setting of new oxygen requirement. He is currently satting in the 90s on 5L [place], desats into the 70s on room air. No signs of infection such as pneumonia. Low suspicion for PE given recent negative V/Q scan with low risk factors.  Hospitalist called for admission   Final Clinical Impression(s) / ED Diagnoses Final diagnoses:  Acute respiratory failure with hypoxia [facility])    Rx / DC Orders ED Discharge Orders     None         Nestor Lewandowsky 04/12/21 [place], [place], MD 04/14/21 [phone]

## 2021-04-12 NOTE — ED Triage Notes (Signed)
PER EMS: pt from home, he woke up around 0600 with SOB, worse when laying down. RA sats 88%. Denies chest pain.  BP 164/76, HR-62, 4L [place] 94%. CBG-180  77% room air sat on arrival, then placed on 4L [place]

## 2021-04-13 ENCOUNTER — Encounter (HOSPITAL_COMMUNITY): Payer: Self-pay | Admitting: Internal Medicine

## 2021-04-13 ENCOUNTER — Inpatient Hospital Stay (HOSPITAL_COMMUNITY): Payer: Medicare Other

## 2021-04-13 DIAGNOSIS — I5033 Acute on chronic diastolic (congestive) heart failure: Secondary | ICD-10-CM

## 2021-04-13 DIAGNOSIS — J9601 Acute respiratory failure with hypoxia: Secondary | ICD-10-CM

## 2021-04-13 LAB — ECHOCARDIOGRAM COMPLETE
AR max vel: 1.91 cm2
AV Area VTI: 1.79 cm2
AV Area mean vel: 1.83 cm2
AV Mean grad: 4 mmHg
AV Peak grad: 6.9 mmHg
Ao pk vel: 1.31 m/s
Area-P 1/2: 4.15 cm2
Height: 71 in
S' Lateral: 3.2 cm
Weight: 3200 oz

## 2021-04-13 LAB — COMPREHENSIVE METABOLIC PANEL
ALT: 12 U/L (ref 0–44)
AST: 15 U/L (ref 15–41)
Albumin: 3.6 g/dL (ref 3.5–5.0)
Alkaline Phosphatase: 57 U/L (ref 38–126)
Anion gap: 10 (ref 5–15)
BUN: 30 mg/dL — ABNORMAL HIGH (ref 8–23)
CO2: 24 mmol/L (ref 22–32)
Calcium: 8.3 mg/dL — ABNORMAL LOW (ref 8.9–10.3)
Chloride: 103 mmol/L (ref 98–111)
Creatinine, Ser: 1.56 mg/dL — ABNORMAL HIGH (ref 0.61–1.24)
GFR, Estimated: 49 mL/min — ABNORMAL LOW (ref >60)
Glucose, Bld: 258 mg/dL — ABNORMAL HIGH (ref 70–99)
Potassium: 4.3 mmol/L (ref 3.5–5.1)
Sodium: 137 mmol/L (ref 135–145)
Total Bilirubin: 1.6 mg/dL — ABNORMAL HIGH (ref 0.3–1.2)
Total Protein: 6.6 g/dL (ref 6.5–8.1)

## 2021-04-13 LAB — CREATININE, SERUM
Creatinine, Ser: 1.63 mg/dL — ABNORMAL HIGH (ref 0.61–1.24)
GFR, Estimated: 46 mL/min — ABNORMAL LOW (ref >60)

## 2021-04-13 LAB — CBC
HCT: 31 % — ABNORMAL LOW (ref 39.0–52.0)
Hemoglobin: 10.4 g/dL — ABNORMAL LOW (ref 13.0–17.0)
MCH: 30.8 pg (ref 26.0–34.0)
MCHC: 33.5 g/dL (ref 30.0–36.0)
MCV: 91.7 fL (ref 80.0–100.0)
Platelets: 169 10*3/uL (ref 150–400)
RBC: 3.38 MIL/uL — ABNORMAL LOW (ref 4.22–5.81)
RDW: 14.7 % (ref 11.5–15.5)
WBC: 6.5 10*3/uL (ref 4.0–10.5)
nRBC: 0 % (ref 0.0–0.2)

## 2021-04-13 LAB — URINALYSIS, ROUTINE W REFLEX MICROSCOPIC
Bacteria, UA: NONE SEEN
Bilirubin Urine: NEGATIVE
Glucose, UA: NEGATIVE mg/dL
Ketones, ur: NEGATIVE mg/dL
Leukocytes,Ua: NEGATIVE
Nitrite: NEGATIVE
Protein, ur: 30 mg/dL — AB
Specific Gravity, Urine: 1.009 (ref 1.005–1.030)
pH: 5 (ref 5.0–8.0)

## 2021-04-13 LAB — PROCALCITONIN: Procalcitonin: <0.1 ng/mL

## 2021-04-13 LAB — CBG MONITORING, ED
Glucose-Capillary: 187 mg/dL — ABNORMAL HIGH (ref 70–99)
Glucose-Capillary: 189 mg/dL — ABNORMAL HIGH (ref 70–99)
Glucose-Capillary: 262 mg/dL — ABNORMAL HIGH (ref 70–99)

## 2021-04-13 LAB — GLUCOSE, CAPILLARY
Glucose-Capillary: 104 mg/dL — ABNORMAL HIGH (ref 70–99)
Glucose-Capillary: 119 mg/dL — ABNORMAL HIGH (ref 70–99)

## 2021-04-13 LAB — HEMOGLOBIN A1C
Hgb A1c MFr Bld: 5.4 % (ref 4.8–5.6)
Mean Plasma Glucose: 108.28 mg/dL

## 2021-04-13 MED ORDER — PRAVASTATIN SODIUM 20 MG PO TABS
20.0000 mg | ORAL_TABLET | Freq: Every day | ORAL | Status: DC
  Administered 2021-04-13 – 2021-04-15 (×3): 20 mg via ORAL
  Filled 2021-04-13 (×3): qty 1

## 2021-04-13 MED ORDER — INSULIN DEGLUDEC 100 UNIT/ML ~~LOC~~ SOPN: 25.0000 [IU] | PEN_INJECTOR | Freq: Every day | SUBCUTANEOUS | Status: DC

## 2021-04-13 MED ORDER — AMLODIPINE BESYLATE 10 MG PO TABS
10.0000 mg | ORAL_TABLET | Freq: Every day | ORAL | Status: DC
  Administered 2021-04-13 – 2021-04-15 (×3): 10 mg via ORAL
  Filled 2021-04-13: qty 1
  Filled 2021-04-13: qty 2
  Filled 2021-04-13: qty 1

## 2021-04-13 MED ORDER — INSULIN GLARGINE-YFGN 100 UNIT/ML ~~LOC~~ SOLN
25.0000 [IU] | Freq: Every day | SUBCUTANEOUS | Status: DC
  Administered 2021-04-13: 25 [IU] via SUBCUTANEOUS
  Filled 2021-04-13 (×2): qty 0.25

## 2021-04-13 MED ORDER — TRAZODONE HCL 100 MG PO TABS
100.0000 mg | ORAL_TABLET | Freq: Every day | ORAL | Status: DC
Start: 2021-04-13 — End: 2021-04-15
  Administered 2021-04-13 – 2021-04-14 (×2): 100 mg via ORAL
  Filled 2021-04-13 (×2): qty 1

## 2021-04-13 MED ORDER — INSULIN ASPART 100 UNIT/ML IJ SOLN
0.0000 [IU] | Freq: Every day | INTRAMUSCULAR | Status: DC
  Filled 2021-04-13: qty 0.05

## 2021-04-13 MED ORDER — FUROSEMIDE 10 MG/ML IJ SOLN
60.0000 mg | Freq: Two times a day (BID) | INTRAMUSCULAR | Status: DC
  Administered 2021-04-13 – 2021-04-14 (×3): 60 mg via INTRAVENOUS
  Filled 2021-04-13: qty 6
  Filled 2021-04-13: qty 8
  Filled 2021-04-13: qty 6

## 2021-04-13 MED ORDER — ENOXAPARIN SODIUM 40 MG/0.4ML IJ SOSY
40.0000 mg | PREFILLED_SYRINGE | INTRAMUSCULAR | Status: DC
  Administered 2021-04-13 – 2021-04-15 (×3): 40 mg via SUBCUTANEOUS
  Filled 2021-04-13 (×3): qty 0.4

## 2021-04-13 MED ORDER — ACETAMINOPHEN 325 MG PO TABS: 650.0000 mg | ORAL_TABLET | Freq: Four times a day (QID) | ORAL | Status: DC | PRN

## 2021-04-13 MED ORDER — POLYVINYL ALCOHOL 1.4 % OP SOLN: 1.0000 [drp] | Freq: Two times a day (BID) | OPHTHALMIC | Status: DC | PRN

## 2021-04-13 MED ORDER — ADULT MULTIVITAMIN W/MINERALS CH
1.0000 | ORAL_TABLET | Freq: Every day | ORAL | Status: DC
  Administered 2021-04-13 – 2021-04-15 (×3): 1 via ORAL
  Filled 2021-04-13 (×3): qty 1

## 2021-04-13 MED ORDER — ONDANSETRON HCL 4 MG PO TABS: 4.0000 mg | ORAL_TABLET | Freq: Four times a day (QID) | ORAL | Status: DC | PRN

## 2021-04-13 MED ORDER — BRIMONIDINE TARTRATE 0.025 % OP SOLN: 1.0000 [drp] | Freq: Two times a day (BID) | OPHTHALMIC | Status: DC | PRN

## 2021-04-13 MED ORDER — PREGABALIN 75 MG PO CAPS
200.0000 mg | ORAL_CAPSULE | Freq: Every day | ORAL | Status: DC
  Administered 2021-04-13 – 2021-04-15 (×3): 200 mg via ORAL
  Filled 2021-04-13 (×2): qty 1
  Filled 2021-04-13: qty 4

## 2021-04-13 MED ORDER — PREGABALIN 75 MG PO CAPS
300.0000 mg | ORAL_CAPSULE | Freq: Every day | ORAL | Status: DC
  Administered 2021-04-13 – 2021-04-14 (×2): 300 mg via ORAL
  Filled 2021-04-13 (×2): qty 4

## 2021-04-13 MED ORDER — NEBIVOLOL HCL 10 MG PO TABS
20.0000 mg | ORAL_TABLET | Freq: Every day | ORAL | Status: DC
  Administered 2021-04-13 – 2021-04-14 (×2): 20 mg via ORAL
  Filled 2021-04-13 (×3): qty 2

## 2021-04-13 MED ORDER — MONTELUKAST SODIUM 10 MG PO TABS
10.0000 mg | ORAL_TABLET | Freq: Every day | ORAL | Status: DC
  Administered 2021-04-13 – 2021-04-14 (×2): 10 mg via ORAL
  Filled 2021-04-13 (×3): qty 1

## 2021-04-13 MED ORDER — ACETAMINOPHEN 650 MG RE SUPP: 650.0000 mg | Freq: Four times a day (QID) | RECTAL | Status: DC | PRN

## 2021-04-13 MED ORDER — PREGABALIN 50 MG PO CAPS: 200.0000 mg | ORAL_CAPSULE | ORAL | Status: DC

## 2021-04-13 MED ORDER — PANTOPRAZOLE SODIUM 40 MG PO TBEC: 40.0000 mg | DELAYED_RELEASE_TABLET | Freq: Every day | ORAL | Status: DC | PRN

## 2021-04-13 MED ORDER — HYDRALAZINE HCL 20 MG/ML IJ SOLN
10.0000 mg | Freq: Three times a day (TID) | INTRAMUSCULAR | Status: DC | PRN
  Administered 2021-04-13 – 2021-04-14 (×2): 10 mg via INTRAVENOUS
  Filled 2021-04-13 (×3): qty 1

## 2021-04-13 MED ORDER — INSULIN ASPART 100 UNIT/ML IJ SOLN
0.0000 [IU] | Freq: Three times a day (TID) | INTRAMUSCULAR | Status: DC
  Administered 2021-04-13: 8 [IU] via SUBCUTANEOUS
  Administered 2021-04-13: 3 [IU] via SUBCUTANEOUS
  Filled 2021-04-13: qty 0.15

## 2021-04-13 NOTE — Progress Notes (Addendum)
PROGRESS NOTE   Jesse Lane  KNL:[id number]    DOB: Nov 24, 1955    DOA: 04/12/2021  PCP: [name], MD   I have briefly reviewed patients previous medical records in [facility].  Chief Complaint  Patient presents with   Shortness of Breath    Brief Narrative:  66 year old male, medical history significant for chronic diastolic CHF, HTN, HLD, DM2 with peripheral neuropathy and nephropathy, GERD, presented to the ED with 1 week history of progressive dyspnea, leg swelling, weight gain, cough and wheezing.  Acute dyspnea and wheezing woke him up at around 4 AM on day of admission.  In ED, room air saturations 77% on arrival, placed on 4 L/min [place] oxygen.  Admitted for suspected acute on chronic diastolic CHF acute respiratory failure with hypoxia due to acute on chronic diastolic CHF.   Assessment & Plan:  Principal Problem:   Acute on chronic heart failure (HCC)   Acute respiratory failure with hypoxia due to acute on chronic diastolic CHF, and bilateral lower lobe collapse/consolidation and bilateral pleural effusions: Presented with acute dyspnea, hypoxic at 77% on room air on arrival. Denies knowing history of CHF. Reports HCTZ was changed to Lasix but may have ran out about a week ago. CT chest without contrast: Bilateral lower lobe collapse/consolidation with small-to-moderate bilateral pleural effusions and findings suggestive of pulmonary artery hypertension. Etiology of collapse and consolidation unclear.  Low index of suspicion for infectious etiology i.e. pneumonia.  Procalcitonin <0.1.  Hold off antibiotics. BNP 741.4.  Still with significant leg edema/volume overload.  Continue IV Lasix 60 mg every 12 hours. 2D echo 03/09/2021: LVEF 60 to 65% and grade 2 diastolic dysfunction. Aggressive incentive spirometry. Wean off oxygen as tolerated. Likely precipitated by noncompliance with diuretics and uncontrolled hypertension. Counseled regarding compliance with  diet, salt restriction, medication compliance. Applied lower extremity compression stockings for volume mobilization. Follow-up chest x-ray two-view in a.m. to ensure the collapse/consolidation is improving and will need follow-up as outpatient as well  Stage IIIa chronic kidney disease Baseline creatinine probably in the 1.2-1.6 range. Creatinine stable compared to admission.   Follow BMP closely while on IV Lasix. Holding ACE. Renal ultrasound: Normal.   HTN Uncontrolled. Currently on amlodipine and nebivolol. Added as needed hydralazine. Resume ACEI as soon as able.   DM2 with peripheral neuropathy and chronic kidney disease Mildly uncontrolled and fluctuating.  Worsened likely due to a dose of IV Solu-Medrol 125 Mg x1 in ED. A1c 5.9 on 01/14/2021. Continue SSI, Semglee and monitor. Had significant proteinuria on prior urine microscopy, could be even nephrotic range.  Repeat urine microscopy.   Normocytic anemia Likely related to anemia of CKD. Stable.   HLD Continue home dose of statins.  Body mass index is 27.89 kg/m.   DVT prophylaxis: enoxaparin (LOVENOX) injection 40 mg Start: 04/13/21 1000     Code Status: Full Code Family Communication: None at bedside. Disposition:  Status is: Inpatient  Remains inpatient appropriate because: Severity of illness including decompensated CHF and need for IV Lasix.        Consultants:   None  Procedures:   None  Antimicrobials:   None   Subjective:  Seen in ED.  No history of home oxygen or smoking.  Reports that sometime in the past, HCTZ was switched to Lasix by his PCP.  Does not know that he had a history of CHF.  Feels like Lasix was for his leg swelling.  Appears to have ran out of Lasix  about 1 week ago coinciding with onset of symptoms.  Feels much better.  Dyspnea improved and breathing may be close to baseline.  No chest pain.  Has nasal congestion.  Leg swelling persist.  Objective:   Vitals:   04/13/21  0608 04/13/21 0615 04/13/21 0630 04/13/21 0633  BP: (!) 179/143  (!) 182/85 (!) 182/85  Pulse:  73 71 (!) 2  Resp:    20  Temp:      SpO2:  90% 95% 95%  Weight:      Height:        General exam: Middle-age male, moderately built and overweight lying comfortably propped up in bed without distress.  On [place] oxygen. Respiratory system: Reduced breath sounds in the bases with occasional basal crackles.  Rest of lung fields clear to auscultation without wheezing, rhonchi or crackles.  No increased work of breathing. Cardiovascular system: S1 & S2 heard, RRR. No JVD, murmurs, rubs, gallops or clicks.  2+ pitting leg edema going up to lower thighs.  Currently not on telemetry. Gastrointestinal system: Abdomen is nondistended, soft and nontender. No organomegaly or masses felt. Normal bowel sounds heard. Central nervous system: Alert and oriented. No focal neurological deficits. Extremities: Symmetric 5 x 5 power. Skin: No rashes, lesions or ulcers Psychiatry: Judgement and insight appear impaired. Mood & affect appropriate.     Data Reviewed:   I have personally reviewed following labs and imaging studies   CBC: Recent Labs  Lab 04/12/21 0700 04/13/21 0616  WBC 5.4 6.5  NEUTROABS 3.9  --   HGB 10.4* 10.4*  HCT 31.6* 31.0*  MCV 92.4 91.7  PLT 176 478    Basic Metabolic Panel: Recent Labs  Lab 04/12/21 0700 04/13/21 0616  NA 136 137  K 4.4 4.3  CL 109 103  CO2 24 24  GLUCOSE 136* 258*  BUN 30* 30*  CREATININE 1.64* 1.56*   1.63*  CALCIUM 8.1* 8.3*    Liver Function Tests: Recent Labs  Lab 04/12/21 0700 04/13/21 0616  AST 13* 15  ALT 11 12  ALKPHOS 62 57  BILITOT 1.2 1.6*  PROT 6.7 6.6  ALBUMIN 3.7 3.6    CBG: Recent Labs  Lab 04/13/21 0025 04/13/21 0749  GLUCAP 189* 262*    Microbiology Studies:   Recent Results (from the past 240 hour(s))  Resp Panel by RT-PCR (Flu A&B, Covid) Nasopharyngeal Swab     Status: None   Collection Time: 04/12/21  7:42 AM    Specimen: Nasopharyngeal Swab; Nasopharyngeal(NP) swabs in vial transport medium  Result Value Ref Range Status   SARS Coronavirus 2 by RT PCR NEGATIVE NEGATIVE Final    Comment: (NOTE) SARS-CoV-2 target nucleic acids are NOT DETECTED.  The SARS-CoV-2 RNA is generally detectable in upper respiratory specimens during the acute phase of infection. The lowest concentration of SARS-CoV-2 viral copies this assay can detect is 138 copies/mL. A negative result does not preclude SARS-Cov-2 infection and should not be used as the sole basis for treatment or other patient management decisions. A negative result may occur with  improper specimen collection/handling, submission of specimen other than nasopharyngeal swab, presence of viral mutation(s) within the areas targeted by this assay, and inadequate number of viral copies(<138 copies/mL). A negative result must be combined with clinical observations, patient history, and epidemiological information. The expected result is Negative.  Fact Sheet for Patients:  EntrepreneurPulse.com.au  Fact Sheet for Healthcare Providers:  IncredibleEmployment.be  This test is no t yet approved or cleared by  the [employer] and  has been authorized for detection and/or diagnosis of SARS-CoV-2 by FDA under an Emergency Use Authorization (EUA). This EUA will remain  in effect (meaning this test can be used) for the duration of the COVID-19 declaration under Section 564(b)(1) of the Act, 21 U.S.C.section 360bbb-3(b)(1), unless the authorization is terminated  or revoked sooner.       Influenza A by PCR NEGATIVE NEGATIVE Final   Influenza B by PCR NEGATIVE NEGATIVE Final    Comment: (NOTE) The Xpert Xpress SARS-CoV-2/FLU/RSV plus assay is intended as an aid in the diagnosis of influenza from Nasopharyngeal swab specimens and should not be used as a sole basis for treatment. Nasal washings and aspirates are  unacceptable for Xpert Xpress SARS-CoV-2/FLU/RSV testing.  Fact Sheet for Patients: EntrepreneurPulse.com.au  Fact Sheet for Healthcare Providers: IncredibleEmployment.be  This test is not yet approved or cleared by the [place] FDA and has been authorized for detection and/or diagnosis of SARS-CoV-2 by FDA under an Emergency Use Authorization (EUA). This EUA will remain in effect (meaning this test can be used) for the duration of the COVID-19 declaration under Section 564(b)(1) of the Act, 21 U.S.C. section 360bbb-3(b)(1), unless the authorization is terminated or revoked.  Performed at [facility], [place] [address], [place], [place] [address]     Radiology Studies:  CT Chest Wo Contrast  Result Date: 04/12/2021 CLINICAL DATA:  Dyspnea.  Evaluate for pneumonia. EXAM: CT CHEST WITHOUT CONTRAST TECHNIQUE: Multidetector CT imaging of the chest was performed following the standard protocol without IV contrast. COMPARISON:  10/06/2020 FINDINGS: Cardiovascular: Heart size upper normal to mildly enlarged. No substantial pericardial effusion. Coronary artery calcification is evident. Mild atherosclerotic calcification is noted in the wall of the thoracic aorta. Enlargement of the pulmonary outflow tract and main pulmonary arteries suggests pulmonary arterial hypertension. Mediastinum/Nodes: Upper normal lymph nodes are seen scattered in the mediastinum. No evidence for gross hilar lymphadenopathy although assessment is limited by the lack of intravenous contrast on the current study. The esophagus has normal imaging features. Similar upper normal right axillary lymph nodes measuring up to 8-9 mm short axis. Lungs/Pleura: Bilateral lower lobe collapse/consolidation evident with small to moderate bilateral pleural effusions. No suspicious pulmonary nodule or mass within the aerated lung. Upper Abdomen: Unremarkable. Musculoskeletal: No  worrisome lytic or sclerotic osseous abnormality. IMPRESSION: 1. Bilateral lower lobe collapse/consolidation with small to moderate bilateral pleural effusions. 2. Enlargement of the pulmonary outflow tract and main pulmonary arteries suggests pulmonary arterial hypertension. 3. Aortic Atherosclerosis (ICD10-I70.0). Electronically Signed   By: Misty Stanley M.D.   On: 04/12/2021 09:21   US RENAL  Result Date: 04/13/2021 CLINICAL DATA:  Acute renal insufficiency EXAM: RENAL / URINARY TRACT ULTRASOUND COMPLETE COMPARISON:  CT 10/06/2020 FINDINGS: Right Kidney: Renal measurements: 11.5 x 5.8 x 5.7 cm = volume: 198 mL. Echogenicity within normal limits. No mass or hydronephrosis visualized. Left Kidney: Renal measurements: 11.8 x 6.3 x 5.1 cm = volume: 198 mL. Echogenicity within normal limits. No mass or hydronephrosis visualized. Bladder: Appears normal for degree of bladder distention. Bilateral ureteral jets identified. Other: None. IMPRESSION: Normal renal sonogram Electronically Signed   By: Fidela Salisbury M.D.   On: 04/13/2021 03:49   DG Chest Portable 1 View  Result Date: 04/12/2021 CLINICAL DATA:  Hypertension, shortness of breath, former smoker EXAM: PORTABLE CHEST 1 VIEW COMPARISON:  03/10/2021 FINDINGS: Heart is enlarged with mid and lower lung asymmetric airspace opacities, worse on the right favored to represent  edema over pneumonia. There is associated bibasilar atelectasis. No large effusion or pneumothorax. Trachea midline. Degenerative changes of the spine. IMPRESSION: Cardiomegaly with mid and lower lung asymmetric edema pattern versus pneumonia. Bibasilar atelectasis. Electronically Signed   By: Jerilynn Mages.  Shick M.D.   On: 04/12/2021 07:48    Scheduled Meds:    amLODipine  10 mg Oral Daily   enoxaparin (LOVENOX) injection  40 mg Subcutaneous Q24H   furosemide  60 mg Intravenous Q12H   insulin aspart  0-15 Units Subcutaneous TID WC   insulin aspart  0-5 Units Subcutaneous QHS   insulin  glargine-yfgn  25 Units Subcutaneous Daily   montelukast  10 mg Oral QHS   multivitamin with minerals  1 tablet Oral Daily   nebivolol  20 mg Oral QHS   pravastatin  20 mg Oral Daily   pregabalin  200 mg Oral Daily   And   pregabalin  300 mg Oral QHS   traZODone  100 mg Oral QHS    Continuous Infusions:     LOS: 1 day     Vernell Leep, MD,  FACP, [facility], [facility], [facility] (Care Management Physician Certified) [place]  To contact the attending provider between 7A-7P or the covering provider during after hours 7P-7A, please log into the web site [email or web address] and access using universal [place] password for that web site. If you do not have the password, please call the hospital operator.  04/13/2021, 8:30 AM

## 2021-04-13 NOTE — ED Notes (Signed)
CBG 189. 

## 2021-04-14 ENCOUNTER — Inpatient Hospital Stay (HOSPITAL_COMMUNITY): Payer: Medicare Other

## 2021-04-14 LAB — BASIC METABOLIC PANEL
Anion gap: 9 (ref 5–15)
BUN: 29 mg/dL — ABNORMAL HIGH (ref 8–23)
CO2: 27 mmol/L (ref 22–32)
Calcium: 8.4 mg/dL — ABNORMAL LOW (ref 8.9–10.3)
Chloride: 103 mmol/L (ref 98–111)
Creatinine, Ser: 1.32 mg/dL — ABNORMAL HIGH (ref 0.61–1.24)
GFR, Estimated: 59 mL/min — ABNORMAL LOW (ref >60)
Glucose, Bld: 93 mg/dL (ref 70–99)
Potassium: 3.7 mmol/L (ref 3.5–5.1)
Sodium: 139 mmol/L (ref 135–145)

## 2021-04-14 LAB — GLUCOSE, CAPILLARY
Glucose-Capillary: 101 mg/dL — ABNORMAL HIGH (ref 70–99)
Glucose-Capillary: 55 mg/dL — ABNORMAL LOW (ref 70–99)
Glucose-Capillary: 65 mg/dL — ABNORMAL LOW (ref 70–99)
Glucose-Capillary: 67 mg/dL — ABNORMAL LOW (ref 70–99)
Glucose-Capillary: 70 mg/dL (ref 70–99)
Glucose-Capillary: 96 mg/dL (ref 70–99)
Glucose-Capillary: 160 mg/dL — ABNORMAL HIGH (ref 70–99)
Glucose-Capillary: 229 mg/dL — ABNORMAL HIGH (ref 70–99)
Glucose-Capillary: 327 mg/dL — ABNORMAL HIGH (ref 70–99)

## 2021-04-14 LAB — PROCALCITONIN: Procalcitonin: <0.1 ng/mL

## 2021-04-14 MED ORDER — INSULIN ASPART 100 UNIT/ML IJ SOLN
0.0000 [IU] | Freq: Three times a day (TID) | INTRAMUSCULAR | Status: DC
  Administered 2021-04-14: 3 [IU] via SUBCUTANEOUS
  Administered 2021-04-14: 2 [IU] via SUBCUTANEOUS
  Administered 2021-04-15: 9 [IU] via SUBCUTANEOUS
  Administered 2021-04-15: 5 [IU] via SUBCUTANEOUS

## 2021-04-14 MED ORDER — INSULIN GLARGINE-YFGN 100 UNIT/ML ~~LOC~~ SOLN
20.0000 [IU] | Freq: Every day | SUBCUTANEOUS | Status: DC
  Administered 2021-04-14 – 2021-04-15 (×2): 20 [IU] via SUBCUTANEOUS
  Filled 2021-04-14 (×2): qty 0.2

## 2021-04-14 MED ORDER — FUROSEMIDE 10 MG/ML IJ SOLN
40.0000 mg | Freq: Two times a day (BID) | INTRAMUSCULAR | Status: DC
  Administered 2021-04-14 – 2021-04-15 (×2): 40 mg via INTRAVENOUS
  Filled 2021-04-14: qty 4

## 2021-04-14 MED ORDER — GLUCOSE 40 % PO GEL
1.0000 | ORAL | Status: AC
  Administered 2021-04-14: 31 g via ORAL
  Filled 2021-04-14: qty 1

## 2021-04-14 NOTE — Progress Notes (Addendum)
Hypoglycemic Event  CBG: 55  Treatment: 8 oz juice/soda  Symptoms: None  Follow-up CBG: Time: 7:49 CBG Result: 67  Possible Reasons for Event: Inadequate meal intake overnight  Comments/MD notified:   After re-check, administered dextrose 40% oral gel (31 g).   Follow-up CBG: Time: 8:22 CBG Result: 37  MD notified and patient is currently eating breakfast. Will re-check CBG after patient is finished eating breakfast. Will continue to monitor.  Layla Maw, RN

## 2021-04-14 NOTE — TOC Progression Note (Signed)
Transition of Care [facility]) - Progression Note    Patient Details  Name: DEAVEN URWIN MRN: [id number] Date of Birth: 1955-05-06  Transition of Care [facility]) CM/SW Contact  Leeroy Cha, RN Phone Number: 04/14/2021, 7:53 AM  Clinical Narrative:    Home heart failure screening sent to [facility] for review.   Expected Discharge Plan: [place] Barriers to Discharge: Continued Medical Work up  Expected Discharge Plan and Services Expected Discharge Plan: [place] In-house Referral: Clinical Social Work Discharge Planning Services: CM Consult Post Acute Care Choice:  (TBD) Living arrangements for the past 2 months: Single Family Home                           HH Arranged: Disease Management [place] Agency: [place] Date [place]: 04/14/21 Time [place]: [phone] Representative spoke with at [place]: [place] ([place]) Interventions    Readmission Risk Interventions Readmission Risk Prevention Plan 03/10/2021  Transportation Screening Complete  HRI or [place] Complete  Social Work Consult for [place] Planning/Counseling Complete  Palliative Care Screening Complete  Medication Review Press photographer) Complete  Some recent data might be hidden

## 2021-04-14 NOTE — Progress Notes (Signed)
After breakfast has been eaten, CBG taken at 0916 is 96 mg/dL. Will continue to monitor.  Layla Maw, RN

## 2021-04-14 NOTE — Progress Notes (Addendum)
°   04/14/21 1916  Provider Notification  Provider Name/Title Dr. Algis Liming  Date Provider Notified 04/14/21  Time Provider Notified [phone]  Notification Type  (Secure chat)  Notification Reason Critical result  Test performed and critical result CBG, 55  Date Critical Result Received 04/14/21  Time Critical Result Received 0734  Provider response See new orders  Date of Provider Response 04/14/21  Time of Provider Response 0829   MD adjusted insulin orders due to CBG results.  Layla Maw, RN

## 2021-04-14 NOTE — Progress Notes (Addendum)
PROGRESS NOTE   Jesse Lane  CNO:[id number]    DOB: 12/19/1955    DOA: 04/12/2021  PCP: [name], MD   I have briefly reviewed patients previous medical records in [facility].  Chief Complaint  Patient presents with   Shortness of Breath    Brief Narrative:  66 year old male, medical history significant for chronic diastolic CHF, HTN, HLD, DM2 with peripheral neuropathy and nephropathy, GERD, presented to the ED with 1 week history of progressive dyspnea, leg swelling, weight gain, cough and wheezing.  Acute dyspnea and wheezing woke him up at around 4 AM on day of admission.  In ED, room air saturations 77% on arrival, placed on 4 L/min [place] oxygen.  Admitted for acute respiratory failure with hypoxia due to acute on chronic diastolic CHF.  Improving with IV diuresis.  Possible DC home 1/7.   Assessment & Plan:  Principal Problem:   Acute on chronic heart failure (HCC)   Acute respiratory failure with hypoxia due to acute on chronic diastolic CHF, and bilateral lower lobe collapse/consolidation and bilateral pleural effusions: Presented with acute dyspnea, hypoxic at 77% on room air on arrival. Denies knowing history of CHF. Reports HCTZ was changed to Lasix but may have ran out about a week PTA. CT chest without contrast: Bilateral lower lobe collapse/consolidation with small-to-moderate bilateral pleural effusions and findings suggestive of pulmonary artery hypertension. Etiology of collapse and consolidation unclear.  Low index of suspicion for infectious etiology i.e. pneumonia.  Procalcitonin <0.1.  Hold off antibiotics. BNP 741.4. Treated with IV Lasix 60 mg every 12 hours.  Volume status significantly improved but still with some volume overload.  Weight recording inaccurate.  -2.6 L thus far. 2D echo 1/5: LVEF 29-47%, LV diastolic parameters were indeterminate. Wean off oxygen as tolerated.  Saturating in the low 90s on 4 L/min [place] oxygen. Likely precipitated  by noncompliance with diuretics and uncontrolled hypertension. Counseled regarding compliance with diet, salt restriction, medication compliance. Applied lower extremity compression stockings for volume mobilization. Reduced Lasix to IV Lasix 40 mg every 12 hours. Chest x-ray personally reviewed: Still has some pulmonary edema but improved compared to prior imagings. Reviewed admission CTA chest with pulmonologist on-call.  Feels that this is due to compression atelectasis from bilateral pleural effusion.  Likely transudative effusions.  Stage IIIa chronic kidney disease Baseline creatinine probably in the 1.2-1.6 range. Follow BMP closely while on IV Lasix. Holding ACE. Renal ultrasound: Normal. Creatinine has improved to 1.32.   HTN Currently on amlodipine and nebivolol. Added as needed hydralazine. Resume ACEI as soon as able. Improved control.   DM2 with peripheral neuropathy and chronic kidney disease, complicated by hypoglycemia Mildly uncontrolled and fluctuating.  Worsened likely due to a dose of IV Solu-Medrol 125 Mg x1 in ED. A1c 5.4 on 04/13/2021 suggests very tight control.  Insulins will have to be reduced at DC. Had significant proteinuria (>300 mg per DL) on prior urine microscopy in May 2022, however repeat urine microscopy shows 30 mg per DL proteinuria Had hypoglycemia with CBGs up to 55 this morning.  Reduced Semglee, changed SSI to sensitive without bedtime scale.  Monitor closely and treat per hypoglycemia protocol.   Normocytic anemia Likely related to anemia of CKD. Stable.   HLD Continue home dose of statins.  Body mass index is 28.63 kg/m.   DVT prophylaxis: enoxaparin (LOVENOX) injection 40 mg Start: 04/13/21 1000     Code Status: Full Code Family Communication: None at bedside. Disposition:  Status  is: Inpatient  Remains inpatient appropriate because: Severity of illness including decompensated CHF and need for IV Lasix.        Consultants:    None  Procedures:   None  Antimicrobials:   None   Subjective:  Feels better.  Dyspnea improved but breathing not yet at baseline.  Still reports dyspnea on exertion such as walking from his hospital bed to the bathroom.  No chest pain, dizziness or lightheadedness.  Leg swelling significantly improved.  Reports nasal stuffiness.  Objective:   Vitals:   04/14/21 0036 04/14/21 0527 04/14/21 0703 04/14/21 1310  BP: (!) 147/85 (!) 172/82 (!) 152/79 (!) 130/57  Pulse: 69 64 63 (!) 57  Resp: 20 18  18   Temp: 98.8 F (37.1 C) 97.8 F (36.6 C)  98.3 F (36.8 C)  TempSrc: Oral Oral  Axillary  SpO2: 90% 91%  95%  Weight:   93.1 kg   Height:        General exam: Middle-age male, moderately built and overweight lying comfortably propped up in bed without distress.  On [place] oxygen. Respiratory system: Slightly diminished breath sounds in the bases with occasional basal crackles.  But otherwise clear to auscultation without wheezing or rhonchi.  No increased work of breathing. Cardiovascular system: S1 and S2 heard, RRR.  No JVD or murmurs.  Trace bilateral leg edema. Gastrointestinal system: Abdomen is nondistended, soft and nontender. No organomegaly or masses felt. Normal bowel sounds heard. Central nervous system: Alert and oriented. No focal neurological deficits. Extremities: Symmetric 5 x 5 power. Skin: No rashes, lesions or ulcers Psychiatry: Judgement and insight appear impaired. Mood & affect appropriate.     Data Reviewed:   I have personally reviewed following labs and imaging studies   CBC: Recent Labs  Lab 04/12/21 0700 04/13/21 0616  WBC 5.4 6.5  NEUTROABS 3.9  --   HGB 10.4* 10.4*  HCT 31.6* 31.0*  MCV 92.4 91.7  PLT 176 332    Basic Metabolic Panel: Recent Labs  Lab 04/12/21 0700 04/13/21 0616 04/14/21 0353  NA 136 137 139  K 4.4 4.3 3.7  CL 109 103 103  CO2 24 24 27   GLUCOSE 136* 258* 93  BUN 30* 30* 29*  CREATININE 1.64* 1.56*   1.63* 1.32*   CALCIUM 8.1* 8.3* 8.4*    Liver Function Tests: Recent Labs  Lab 04/12/21 0700 04/13/21 0616  AST 13* 15  ALT 11 12  ALKPHOS 62 57  BILITOT 1.2 1.6*  PROT 6.7 6.6  ALBUMIN 3.7 3.6    CBG: Recent Labs  Lab 04/14/21 0822 04/14/21 0916 04/14/21 1112  GLUCAP 70 96 160*    Microbiology Studies:   Recent Results (from the past 240 hour(s))  Resp Panel by RT-PCR (Flu A&B, Covid) Nasopharyngeal Swab     Status: None   Collection Time: 04/12/21  7:42 AM   Specimen: Nasopharyngeal Swab; Nasopharyngeal(NP) swabs in vial transport medium  Result Value Ref Range Status   SARS Coronavirus 2 by RT PCR NEGATIVE NEGATIVE Final    Comment: (NOTE) SARS-CoV-2 target nucleic acids are NOT DETECTED.  The SARS-CoV-2 RNA is generally detectable in upper respiratory specimens during the acute phase of infection. The lowest concentration of SARS-CoV-2 viral copies this assay can detect is 138 copies/mL. A negative result does not preclude SARS-Cov-2 infection and should not be used as the sole basis for treatment or other patient management decisions. A negative result may occur with  improper specimen collection/handling, submission of specimen  other than nasopharyngeal swab, presence of viral mutation(s) within the areas targeted by this assay, and inadequate number of viral copies(<138 copies/mL). A negative result must be combined with clinical observations, patient history, and epidemiological information. The expected result is Negative.  Fact Sheet for Patients:  EntrepreneurPulse.com.au  Fact Sheet for Healthcare Providers:  IncredibleEmployment.be  This test is no t yet approved or cleared by the [place] FDA and  has been authorized for detection and/or diagnosis of SARS-CoV-2 by FDA under an Emergency Use Authorization (EUA). This EUA will remain  in effect (meaning this test can be used) for the duration of the COVID-19  declaration under Section 564(b)(1) of the Act, 21 U.S.C.section 360bbb-3(b)(1), unless the authorization is terminated  or revoked sooner.       Influenza A by PCR NEGATIVE NEGATIVE Final   Influenza B by PCR NEGATIVE NEGATIVE Final    Comment: (NOTE) The Xpert Xpress SARS-CoV-2/FLU/RSV plus assay is intended as an aid in the diagnosis of influenza from Nasopharyngeal swab specimens and should not be used as a sole basis for treatment. Nasal washings and aspirates are unacceptable for Xpert Xpress SARS-CoV-2/FLU/RSV testing.  Fact Sheet for Patients: EntrepreneurPulse.com.au  Fact Sheet for Healthcare Providers: IncredibleEmployment.be  This test is not yet approved or cleared by the [place] FDA and has been authorized for detection and/or diagnosis of SARS-CoV-2 by FDA under an Emergency Use Authorization (EUA). This EUA will remain in effect (meaning this test can be used) for the duration of the COVID-19 declaration under Section 564(b)(1) of the Act, 21 U.S.C. section 360bbb-3(b)(1), unless the authorization is terminated or revoked.  Performed at [facility], [place] [address], [place], [place] [address]     Radiology Studies:  US RENAL  Result Date: 04-19-2021 CLINICAL DATA:  Acute renal insufficiency EXAM: RENAL / URINARY TRACT ULTRASOUND COMPLETE COMPARISON:  CT 10/06/2020 FINDINGS: Right Kidney: Renal measurements: 11.5 x 5.8 x 5.7 cm = volume: 198 mL. Echogenicity within normal limits. No mass or hydronephrosis visualized. Left Kidney: Renal measurements: 11.8 x 6.3 x 5.1 cm = volume: 198 mL. Echogenicity within normal limits. No mass or hydronephrosis visualized. Bladder: Appears normal for degree of bladder distention. Bilateral ureteral jets identified. Other: None. IMPRESSION: Normal renal sonogram Electronically Signed   By: Fidela Salisbury M.D.   On: April 19, 2021 03:49   ECHOCARDIOGRAM COMPLETE  Result  Date: 04-19-2021    ECHOCARDIOGRAM REPORT   Patient Name:   Jesse Lane Date of Exam: 2021-04-19 Medical Rec #:  [id number]        Height:       71.0 in Accession #:    [id number]       Weight:       200.0 lb Date of Birth:  11-24-1955        BSA:          2.109 m Patient Age:    74 years         BP:           163/84 mmHg Patient Gender: M                HR:           65 bpm. Exam Location:  Inpatient Procedure: 2D Echo, Cardiac Doppler and Color Doppler Indications:    CHF  History:        Patient has prior history of Echocardiogram examinations, most  recent 03/09/2021. Stroke; Risk Factors:Diabetes and                 Hypertension.  Sonographer:    Glo Herring Referring Phys: [id number] [place]  1. Left ventricular ejection fraction, by estimation, is 60 to 65%. The left ventricle has normal function. The left ventricle has no regional wall motion abnormalities. There is mild left ventricular hypertrophy. Left ventricular diastolic parameters are indeterminate.  2. Right ventricular systolic function is normal. The right ventricular size is normal.  3. The mitral valve is normal in structure. Trivial mitral valve regurgitation.  4. The aortic valve is normal in structure. Aortic valve regurgitation is not visualized.  5. The inferior vena cava is dilated in size with <50% respiratory variability, suggesting right atrial pressure of 15 mmHg. Comparison(s): The left ventricular function is unchanged. FINDINGS  Left Ventricle: Left ventricular ejection fraction, by estimation, is 60 to 65%. The left ventricle has normal function. The left ventricle has no regional wall motion abnormalities. The left ventricular internal cavity size was normal in size. There is  mild left ventricular hypertrophy. Left ventricular diastolic parameters are indeterminate. Right Ventricle: The right ventricular size is normal. Right vetricular wall thickness was not assessed. Right ventricular systolic  function is normal. Left Atrium: Left atrial size was normal in size. Right Atrium: Right atrial size was normal in size. Pericardium: There is no evidence of pericardial effusion. Mitral Valve: The mitral valve is normal in structure. Trivial mitral valve regurgitation. Tricuspid Valve: The tricuspid valve is normal in structure. Tricuspid valve regurgitation is trivial. Aortic Valve: The aortic valve is normal in structure. Aortic valve regurgitation is not visualized. Aortic valve mean gradient measures 4.0 mmHg. Aortic valve peak gradient measures 6.9 mmHg. Aortic valve area, by VTI measures 1.79 cm. Pulmonic Valve: The pulmonic valve was not well visualized. Pulmonic valve regurgitation is trivial. Aorta: The aortic root and ascending aorta are structurally normal, with no evidence of dilitation. Venous: The inferior vena cava is dilated in size with less than 50% respiratory variability, suggesting right atrial pressure of 15 mmHg. IAS/Shunts: No atrial level shunt detected by color flow Doppler.  LEFT VENTRICLE PLAX 2D LVIDd:         5.00 cm   Diastology LVIDs:         3.20 cm   LV e' medial:    6.53 cm/s LV PW:         1.30 cm   LV E/e' medial:  12.8 LV IVS:        1.30 cm   LV e' lateral:   6.96 cm/s LVOT diam:     2.00 cm   LV E/e' lateral: 12.0 LV SV:         61 LV SV Index:   29 LVOT Area:     3.14 cm  RIGHT VENTRICLE             IVC RV Basal diam:  5.30 cm     IVC diam: 2.60 cm RV Mid diam:    3.90 cm RV S prime:     12.50 cm/s LEFT ATRIUM             Index        RIGHT ATRIUM           Index LA diam:        4.70 cm 2.23 cm/m   RA Area:     23.90 cm LA Vol (A2C):   74.2  ml 35.19 ml/m  RA Volume:   69.80 ml  33.10 ml/m LA Vol (A4C):   60.5 ml 28.69 ml/m LA Biplane Vol: 67.9 ml 32.20 ml/m  AORTIC VALVE AV Area (Vmax):    1.91 cm AV Area (Vmean):   1.83 cm AV Area (VTI):     1.79 cm AV Vmax:           131.00 cm/s AV Vmean:          90.400 cm/s AV VTI:            0.343 m AV Peak Grad:      6.9  mmHg AV Mean Grad:      4.0 mmHg LVOT Vmax:         79.60 cm/s LVOT Vmean:        52.800 cm/s LVOT VTI:          0.195 m LVOT/AV VTI ratio: 0.57  AORTA Ao Root diam: 3.40 cm Ao Asc diam:  3.50 cm MITRAL VALVE MV Area (PHT): 4.15 cm    SHUNTS MV Decel Time: 183 msec    Systemic VTI:  0.20 m MV E velocity: 83.60 cm/s  Systemic Diam: 2.00 cm MV A velocity: 42.40 cm/s MV E/A ratio:  1.97 Dorris Carnes MD Electronically signed by Dorris Carnes MD Signature Date/Time: 04/13/2021/3:59:00 PM    Final     Scheduled Meds:    amLODipine  10 mg Oral Daily   enoxaparin (LOVENOX) injection  40 mg Subcutaneous Q24H   furosemide  60 mg Intravenous Q12H   insulin aspart  0-9 Units Subcutaneous TID WC   insulin glargine-yfgn  20 Units Subcutaneous Daily   montelukast  10 mg Oral QHS   multivitamin with minerals  1 tablet Oral Daily   nebivolol  20 mg Oral QHS   pravastatin  20 mg Oral Daily   pregabalin  200 mg Oral Daily   And   pregabalin  300 mg Oral QHS   traZODone  100 mg Oral QHS    Continuous Infusions:     LOS: 2 days     Vernell Leep, MD,  FACP, [facility], [facility], [facility] (Care Management Physician Certified) [facility]  To contact the attending provider between 7A-7P or the covering provider during after hours 7P-7A, please log into the web site [email or web address] and access using universal [place] password for that web site. If you do not have the password, please call the hospital operator.  04/14/2021, 1:54 PM

## 2021-04-15 LAB — GLUCOSE, CAPILLARY
Glucose-Capillary: 283 mg/dL — ABNORMAL HIGH (ref 70–99)
Glucose-Capillary: 355 mg/dL — ABNORMAL HIGH (ref 70–99)

## 2021-04-15 LAB — BASIC METABOLIC PANEL
Anion gap: 9 (ref 5–15)
BUN: 34 mg/dL — ABNORMAL HIGH (ref 8–23)
CO2: 29 mmol/L (ref 22–32)
Calcium: 8.1 mg/dL — ABNORMAL LOW (ref 8.9–10.3)
Chloride: 98 mmol/L (ref 98–111)
Creatinine, Ser: 1.47 mg/dL — ABNORMAL HIGH (ref 0.61–1.24)
GFR, Estimated: 52 mL/min — ABNORMAL LOW (ref >60)
Glucose, Bld: 304 mg/dL — ABNORMAL HIGH (ref 70–99)
Potassium: 3.6 mmol/L (ref 3.5–5.1)
Sodium: 136 mmol/L (ref 135–145)

## 2021-04-15 LAB — PROCALCITONIN: Procalcitonin: <0.1 ng/mL

## 2021-04-15 LAB — MAGNESIUM: Magnesium: 1.9 mg/dL (ref 1.7–2.4)

## 2021-04-15 MED ORDER — POTASSIUM CHLORIDE CRYS ER 20 MEQ PO TBCR: EXTENDED_RELEASE_TABLET | ORAL | 0 refills | Status: DC

## 2021-04-15 MED ORDER — FUROSEMIDE 40 MG PO TABS: ORAL_TABLET | ORAL | 0 refills | Status: DC

## 2021-04-15 NOTE — Discharge Summary (Signed)
Physician Discharge Summary  Jesse Lane CLE:[id number] DOB: 25-Sep-1955 DOA: 04/12/2021  PCP: Jesse Rasmussen, MD  Admit date: 04/12/2021 Discharge date: 04/15/2021  Admitted From: Home Disposition:  Home  Recommendations for Outpatient Follow-up:  Follow up with PCP in 1 week Please obtain BMP/CBC in 1 week to ensure stability of renal function and electrolytes while on Lasix and anemia respectively.  Home Health: None  Equipment/Devices: None   Discharge Condition: Good CODE STATUS: Full  Diet recommendation: Heart Healthy  Brief/Interim Summary: From H&P: From Dr. Cherylann Lane: "HPI: Jesse Lane is a 66 y.o. male with medical history significant of HFpEF, HTN, HLD, DM2. Presenting with dyspnea. Symptoms started last night with cough. He states that family noted his face was red. He was able to go to sleep in relative comfort. However, he woke this morning around 4a with acute shortness of breath and wheeze. He became concerned and called for EMS. He denies any other aggravating or alleviating factors.     ED Course: He was noted to have and elevate BNP. He was hypoxic on RA. He was placed on 4L [place]. Clinical concern for acute HF. Started on lasix. TRH was called for admission."  Interim: Pt diuresed well and Cr improved. Breathing improved.   Subjective on day of discharge: Immediately asked when he could go home. I removed him from O2 and asked his nurse to walk him around. He maintained saturation in mid 90's without becoming excessively short of breath. He will maintain his use of Lasix.   Discharge Diagnoses:  Principal Problem:   Acute on chronic heart failure (HCC) Active Problems:   Essential hypertension   Insulin-requiring or dependent type II diabetes mellitus (HCC)   GERD (gastroesophageal reflux disease)  Pt diuresed well. D/c'd on Lasix 40 mg twice daily. Will need to ensure he doe snot have hypokalemia on outpatient basis.   Other chronic conditions were  non-contributory to his admission.   Discharge Instructions  Discharge Instructions     Call MD for:  difficulty breathing, headache or visual disturbances   Complete by: As directed    Diet - low sodium heart healthy   Complete by: As directed    Increase activity slowly   Complete by: As directed       Allergies as of 04/15/2021       Reactions   Contrast Media [iodinated Contrast Media] Itching        Medication List     TAKE these medications    alendronate 70 MG tablet Commonly known as: FOSAMAX Take 70 mg by mouth once a week. Wednesday's   amLODipine 10 MG tablet Commonly known as: NORVASC Take 10 mg by mouth daily.   Bystolic 20 MG Tabs Generic drug: Nebivolol HCl Take 20 mg by mouth at bedtime.   furosemide 40 MG tablet Commonly known as: Lasix 40 mg daily   insulin lispro 100 UNIT/ML KwikPen Commonly known as: HUMALOG Inject 7-15 Units into the skin See admin instructions. 7 units in the morning, 7 units at lunch, and 8 units in the evening. May take additional units as needed, max of 50 units per day   Insulin Pen Needle 32G X 6 MM Misc Commonly known as: BD Pen Needle Micro U/F 1 each by Other route See admin instructions. Use one pen needle to inject insulin 4 times daily. **must have appointment for additional refills.** What changed: Another medication with the same name was changed. Make sure you understand how and  when to take each.   Insulin Pen Needle 32G X 6 MM Misc Commonly known as: BD Pen Needle Micro U/F USE 1 PEN NEEDLE SIX TIMES DAILY What changed:  how much to take how to take this when to take this   lisinopril 40 MG tablet Commonly known as: ZESTRIL Take 40 mg by mouth at bedtime.   Lumify 0.025 % Soln Generic drug: Brimonidine Tartrate Place 1 drop into both eyes 2 (two) times daily as needed (irritated/dry eyes).   montelukast 10 MG tablet Commonly known as: SINGULAIR Take 10 mg by mouth at bedtime.   multivitamin  with minerals Tabs tablet Take 1 tablet by mouth daily.   ondansetron 4 MG tablet Commonly known as: ZOFRAN Take 1 tablet (4 mg total) by mouth every 6 (six) hours as needed for nausea.   pantoprazole 40 MG tablet Commonly known as: PROTONIX Take 1 tablet by mouth twice daily What changed:  when to take this reasons to take this   potassium chloride SA 20 MEQ tablet Commonly known as: KLOR-CON M 20 mEq daily   pravastatin 20 MG tablet Commonly known as: PRAVACHOL Take 20 mg by mouth daily.   pregabalin 100 MG capsule Commonly known as: LYRICA Take 200-300 mg by mouth See admin instructions. Take 2 capsule (200 mg) by mouth every morning and take three capsules (300 mg) at bedtime   traZODone 100 MG tablet Commonly known as: DESYREL Take 100 mg by mouth at bedtime.   Jesse Lane FlexTouch 100 UNIT/ML FlexTouch Pen Generic drug: insulin degludec Inject 25 Units into the skin daily before breakfast.        Allergies  Allergen Reactions   Contrast Media [Iodinated Contrast Media] Itching    Consultations: None   Procedures/Studies: DG Chest 2 View  Result Date: 04/14/2021 CLINICAL DATA:  CHF EXAM: CHEST - 2 VIEW COMPARISON:  04/12/2021 FINDINGS: Frontal and lateral views of the chest demonstrate an enlarged cardiac silhouette. Continue central vascular congestion, with bibasilar consolidation and small effusions unchanged. No pneumothorax. No acute bony abnormalities. IMPRESSION: 1. Stable congestive heart failure. Electronically Signed   By: Randa Ngo M.D.   On: 04/14/2021 14:59   CT Chest Wo Contrast  Result Date: 04/12/2021 CLINICAL DATA:  Dyspnea.  Evaluate for pneumonia. EXAM: CT CHEST WITHOUT CONTRAST TECHNIQUE: Multidetector CT imaging of the chest was performed following the standard protocol without IV contrast. COMPARISON:  10/06/2020 FINDINGS: Cardiovascular: Heart size upper normal to mildly enlarged. No substantial pericardial effusion. Coronary artery  calcification is evident. Mild atherosclerotic calcification is noted in the wall of the thoracic aorta. Enlargement of the pulmonary outflow tract and main pulmonary arteries suggests pulmonary arterial hypertension. Mediastinum/Nodes: Upper normal lymph nodes are seen scattered in the mediastinum. No evidence for gross hilar lymphadenopathy although assessment is limited by the lack of intravenous contrast on the current study. The esophagus has normal imaging features. Similar upper normal right axillary lymph nodes measuring up to 8-9 mm short axis. Lungs/Pleura: Bilateral lower lobe collapse/consolidation evident with small to moderate bilateral pleural effusions. No suspicious pulmonary nodule or mass within the aerated lung. Upper Abdomen: Unremarkable. Musculoskeletal: No worrisome lytic or sclerotic osseous abnormality. IMPRESSION: 1. Bilateral lower lobe collapse/consolidation with small to moderate bilateral pleural effusions. 2. Enlargement of the pulmonary outflow tract and main pulmonary arteries suggests pulmonary arterial hypertension. 3. Aortic Atherosclerosis (ICD10-I70.0). Electronically Signed   By: Misty Stanley M.D.   On: 04/12/2021 09:21   US RENAL  Result Date: 04/13/2021 CLINICAL DATA:  Acute renal insufficiency EXAM: RENAL / URINARY TRACT ULTRASOUND COMPLETE COMPARISON:  CT 10/06/2020 FINDINGS: Right Kidney: Renal measurements: 11.5 x 5.8 x 5.7 cm = volume: 198 mL. Echogenicity within normal limits. No mass or hydronephrosis visualized. Left Kidney: Renal measurements: 11.8 x 6.3 x 5.1 cm = volume: 198 mL. Echogenicity within normal limits. No mass or hydronephrosis visualized. Bladder: Appears normal for degree of bladder distention. Bilateral ureteral jets identified. Other: None. IMPRESSION: Normal renal sonogram Electronically Signed   By: Fidela Salisbury M.D.   On: 04/13/2021 03:49   DG Chest Portable 1 View  Result Date: 04/12/2021 CLINICAL DATA:  Hypertension, shortness of  breath, former smoker EXAM: PORTABLE CHEST 1 VIEW COMPARISON:  03/10/2021 FINDINGS: Heart is enlarged with mid and lower lung asymmetric airspace opacities, worse on the right favored to represent edema over pneumonia. There is associated bibasilar atelectasis. No large effusion or pneumothorax. Trachea midline. Degenerative changes of the spine. IMPRESSION: Cardiomegaly with mid and lower lung asymmetric edema pattern versus pneumonia. Bibasilar atelectasis. Electronically Signed   By: Jerilynn Mages.  Shick M.D.   On: 04/12/2021 07:48   ECHOCARDIOGRAM COMPLETE  Result Date: 04/13/2021    ECHOCARDIOGRAM REPORT   Patient Name:   JERSEY RAVENSCROFT Date of Exam: 04/13/2021 Medical Rec #:  [id number]        Height:       71.0 in Accession #:    [id number]       Weight:       200.0 lb Date of Birth:  [birth year]        BSA:          2.109 m Patient Age:    65 years         BP:           163/84 mmHg Patient Gender: M                HR:           65 bpm. Exam Location:  Inpatient Procedure: 2D Echo, Cardiac Doppler and Color Doppler Indications:    CHF  History:        Patient has prior history of Echocardiogram examinations, most                 recent 03/09/2021. Stroke; Risk Factors:Diabetes and                 Hypertension.  Sonographer:    Glo Herring Referring Phys: [id number] [place]  1. Left ventricular ejection fraction, by estimation, is 60 to 65%. The left ventricle has normal function. The left ventricle has no regional wall motion abnormalities. There is mild left ventricular hypertrophy. Left ventricular diastolic parameters are indeterminate.  2. Right ventricular systolic function is normal. The right ventricular size is normal.  3. The mitral valve is normal in structure. Trivial mitral valve regurgitation.  4. The aortic valve is normal in structure. Aortic valve regurgitation is not visualized.  5. The inferior vena cava is dilated in size with <50% respiratory variability, suggesting right atrial  pressure of 15 mmHg. Comparison(s): The left ventricular function is unchanged. FINDINGS  Left Ventricle: Left ventricular ejection fraction, by estimation, is 60 to 65%. The left ventricle has normal function. The left ventricle has no regional wall motion abnormalities. The left ventricular internal cavity size was normal in size. There is  mild left ventricular hypertrophy. Left ventricular diastolic parameters are indeterminate. Right Ventricle: The right ventricular size is normal. Right vetricular wall  thickness was not assessed. Right ventricular systolic function is normal. Left Atrium: Left atrial size was normal in size. Right Atrium: Right atrial size was normal in size. Pericardium: There is no evidence of pericardial effusion. Mitral Valve: The mitral valve is normal in structure. Trivial mitral valve regurgitation. Tricuspid Valve: The tricuspid valve is normal in structure. Tricuspid valve regurgitation is trivial. Aortic Valve: The aortic valve is normal in structure. Aortic valve regurgitation is not visualized. Aortic valve mean gradient measures 4.0 mmHg. Aortic valve peak gradient measures 6.9 mmHg. Aortic valve area, by VTI measures 1.79 cm. Pulmonic Valve: The pulmonic valve was not well visualized. Pulmonic valve regurgitation is trivial. Aorta: The aortic root and ascending aorta are structurally normal, with no evidence of dilitation. Venous: The inferior vena cava is dilated in size with less than 50% respiratory variability, suggesting right atrial pressure of 15 mmHg. IAS/Shunts: No atrial level shunt detected by color flow Doppler.  LEFT VENTRICLE PLAX 2D LVIDd:         5.00 cm   Diastology LVIDs:         3.20 cm   LV e' medial:    6.53 cm/s LV PW:         1.30 cm   LV E/e' medial:  12.8 LV IVS:        1.30 cm   LV e' lateral:   6.96 cm/s LVOT diam:     2.00 cm   LV E/e' lateral: 12.0 LV SV:         61 LV SV Index:   29 LVOT Area:     3.14 cm  RIGHT VENTRICLE             IVC RV Basal  diam:  5.30 cm     IVC diam: 2.60 cm RV Mid diam:    3.90 cm RV S prime:     12.50 cm/s LEFT ATRIUM             Index        RIGHT ATRIUM           Index LA diam:        4.70 cm 2.23 cm/m   RA Area:     23.90 cm LA Vol (A2C):   74.2 ml 35.19 ml/m  RA Volume:   69.80 ml  33.10 ml/m LA Vol (A4C):   60.5 ml 28.69 ml/m LA Biplane Vol: 67.9 ml 32.20 ml/m  AORTIC VALVE AV Area (Vmax):    1.91 cm AV Area (Vmean):   1.83 cm AV Area (VTI):     1.79 cm AV Vmax:           131.00 cm/s AV Vmean:          90.400 cm/s AV VTI:            0.343 m AV Peak Grad:      6.9 mmHg AV Mean Grad:      4.0 mmHg LVOT Vmax:         79.60 cm/s LVOT Vmean:        52.800 cm/s LVOT VTI:          0.195 m LVOT/AV VTI ratio: 0.57  AORTA Ao Root diam: 3.40 cm Ao Asc diam:  3.50 cm MITRAL VALVE MV Area (PHT): 4.15 cm    SHUNTS MV Decel Time: 183 msec    Systemic VTI:  0.20 m MV E velocity: 83.60 cm/s  Systemic Diam: 2.00 cm MV A velocity: 42.40  cm/s MV E/A ratio:  1.97 Dorris Carnes MD Electronically signed by Dorris Carnes MD Signature Date/Time: 04/13/2021/3:59:00 PM    Final     Discharge Exam: Vitals:   04/15/21 1117 04/15/21 1120  BP:    Pulse: 62 62  Resp:    Temp:    SpO2: 97% 90%     General: Pt is alert, awake, not in acute distress Cardiovascular: RRR, S1/S2 +, no edema Respiratory: CTA bilaterally, no wheezing, no rhonchi, no respiratory distress, no conversational dyspnea  Abdominal: Soft, NT, ND, bowel sounds + Extremities: no edema, no cyanosis Psych: Normal mood and affect, stable judgement and insight     The results of significant diagnostics from this hospitalization (including imaging, microbiology, ancillary and laboratory) are listed below for reference.     Microbiology: Recent Results (from the past 240 hour(s))  Resp Panel by RT-PCR (Flu A&B, Covid) Nasopharyngeal Swab     Status: None   Collection Time: 04/12/21  7:42 AM   Specimen: Nasopharyngeal Swab; Nasopharyngeal(NP) swabs in vial transport  medium  Result Value Ref Range Status   SARS Coronavirus 2 by RT PCR NEGATIVE NEGATIVE Final    Comment: (NOTE) SARS-CoV-2 target nucleic acids are NOT DETECTED.  The SARS-CoV-2 RNA is generally detectable in upper respiratory specimens during the acute phase of infection. The lowest concentration of SARS-CoV-2 viral copies this assay can detect is 138 copies/mL. A negative result does not preclude SARS-Cov-2 infection and should not be used as the sole basis for treatment or other patient management decisions. A negative result may occur with  improper specimen collection/handling, submission of specimen other than nasopharyngeal swab, presence of viral mutation(s) within the areas targeted by this assay, and inadequate number of viral copies(<138 copies/mL). A negative result must be combined with clinical observations, patient history, and epidemiological information. The expected result is Negative.  Fact Sheet for Patients:  EntrepreneurPulse.com.au  Fact Sheet for Healthcare Providers:  IncredibleEmployment.be  This test is no t yet approved or cleared by the [place] FDA and  has been authorized for detection and/or diagnosis of SARS-CoV-2 by FDA under an Emergency Use Authorization (EUA). This EUA will remain  in effect (meaning this test can be used) for the duration of the COVID-19 declaration under Section 564(b)(1) of the Act, 21 U.S.C.section 360bbb-3(b)(1), unless the authorization is terminated  or revoked sooner.       Influenza A by PCR NEGATIVE NEGATIVE Final   Influenza B by PCR NEGATIVE NEGATIVE Final    Comment: (NOTE) The Xpert Xpress SARS-CoV-2/FLU/RSV plus assay is intended as an aid in the diagnosis of influenza from Nasopharyngeal swab specimens and should not be used as a sole basis for treatment. Nasal washings and aspirates are unacceptable for Xpert Xpress SARS-CoV-2/FLU/RSV testing.  Fact Sheet for  Patients: EntrepreneurPulse.com.au  Fact Sheet for Healthcare Providers: IncredibleEmployment.be  This test is not yet approved or cleared by the [place] FDA and has been authorized for detection and/or diagnosis of SARS-CoV-2 by FDA under an Emergency Use Authorization (EUA). This EUA will remain in effect (meaning this test can be used) for the duration of the COVID-19 declaration under Section 564(b)(1) of the Act, 21 U.S.C. section 360bbb-3(b)(1), unless the authorization is terminated or revoked.  Performed at [facility], [place] [address], [place], [place] [address]      Labs: BNP (last 3 results) Recent Labs    03/08/21 2033 04/12/21 0701  BNP 683.9* 536.6*   Basic Metabolic Panel: Recent  Labs  Lab 04/12/21 0700 04/13/21 0616 04/14/21 0353 04/15/21 0341  NA 136 137 139 136  K 4.4 4.3 3.7 3.6  CL 109 103 103 98  CO2 24 24 27 29   GLUCOSE 136* 258* 93 304*  BUN 30* 30* 29* 34*  CREATININE 1.64* 1.56*   1.63* 1.32* 1.47*  CALCIUM 8.1* 8.3* 8.4* 8.1*  MG  --   --   --  1.9   Liver Function Tests: Recent Labs  Lab 04/12/21 0700 04/13/21 0616  AST 13* 15  ALT 11 12  ALKPHOS 62 57  BILITOT 1.2 1.6*  PROT 6.7 6.6  ALBUMIN 3.7 3.6   CBC: Recent Labs  Lab 04/12/21 0700 04/13/21 0616  WBC 5.4 6.5  NEUTROABS 3.9  --   HGB 10.4* 10.4*  HCT 31.6* 31.0*  MCV 92.4 91.7  PLT 176 169   CBG: Recent Labs  Lab 04/14/21 1112 04/14/21 1742 04/14/21 2056 04/15/21 0747 04/15/21 1128  GLUCAP 160* 229* 327* 283* 355*   Hgb A1c Recent Labs    04/13/21 0616  HGBA1C 5.4   Urinalysis    Component Value Date/Time   COLORURINE YELLOW 04/13/2021 1018   APPEARANCEUR CLEAR 04/13/2021 1018   LABSPEC 1.009 04/13/2021 1018   PHURINE 5.0 04/13/2021 1018   GLUCOSEU NEGATIVE 04/13/2021 1018   HGBUR SMALL (A) 04/13/2021 1018   BILIRUBINUR NEGATIVE 04/13/2021 1018   KETONESUR NEGATIVE 04/13/2021 1018    PROTEINUR 30 (A) 04/13/2021 1018   UROBILINOGEN 0.2 12/05/2011 1655   NITRITE NEGATIVE 04/13/2021 1018   [place] 04/13/2021 1018   Microbiology Recent Results (from the past 240 hour(s))  Resp Panel by RT-PCR (Flu A&B, Covid) Nasopharyngeal Swab     Status: None   Collection Time: 04/12/21  7:42 AM   Specimen: Nasopharyngeal Swab; Nasopharyngeal(NP) swabs in vial transport medium  Result Value Ref Range Status   SARS Coronavirus 2 by RT PCR NEGATIVE NEGATIVE Final    Comment: (NOTE) SARS-CoV-2 target nucleic acids are NOT DETECTED.  The SARS-CoV-2 RNA is generally detectable in upper respiratory specimens during the acute phase of infection. The lowest concentration of SARS-CoV-2 viral copies this assay can detect is 138 copies/mL. A negative result does not preclude SARS-Cov-2 infection and should not be used as the sole basis for treatment or other patient management decisions. A negative result may occur with  improper specimen collection/handling, submission of specimen other than nasopharyngeal swab, presence of viral mutation(s) within the areas targeted by this assay, and inadequate number of viral copies(<138 copies/mL). A negative result must be combined with clinical observations, patient history, and epidemiological information. The expected result is Negative.  Fact Sheet for Patients:  EntrepreneurPulse.com.au  Fact Sheet for Healthcare Providers:  IncredibleEmployment.be  This test is no t yet approved or cleared by the [place] FDA and  has been authorized for detection and/or diagnosis of SARS-CoV-2 by FDA under an Emergency Use Authorization (EUA). This EUA will remain  in effect (meaning this test can be used) for the duration of the COVID-19 declaration under Section 564(b)(1) of the Act, 21 U.S.C.section 360bbb-3(b)(1), unless the authorization is terminated  or revoked sooner.       Influenza A by  PCR NEGATIVE NEGATIVE Final   Influenza B by PCR NEGATIVE NEGATIVE Final    Comment: (NOTE) The Xpert Xpress SARS-CoV-2/FLU/RSV plus assay is intended as an aid in the diagnosis of influenza from Nasopharyngeal swab specimens and should not be used as a sole basis for treatment. Nasal  washings and aspirates are unacceptable for Xpert Xpress SARS-CoV-2/FLU/RSV testing.  Fact Sheet for Patients: EntrepreneurPulse.com.au  Fact Sheet for Healthcare Providers: IncredibleEmployment.be  This test is not yet approved or cleared by the [place] FDA and has been authorized for detection and/or diagnosis of SARS-CoV-2 by FDA under an Emergency Use Authorization (EUA). This EUA will remain in effect (meaning this test can be used) for the duration of the COVID-19 declaration under Section 564(b)(1) of the Act, 21 U.S.C. section 360bbb-3(b)(1), unless the authorization is terminated or revoked.  Performed at [facility], [place] [address], [place], [place] [address]    Patient was seen and examined on the day of discharge and was found to be in stable condition. Time coordinating discharge: 25 minutes including assessment and coordination of care, as well as examination of the patient.   SIGNED:  Shelda Pal, DO [facility] 04/15/2021, 3:58 PM

## 2021-04-15 NOTE — Progress Notes (Signed)
Patient ID: Jesse Lane, male   DOB: [birth year], 66 y.o.   MRN: [id number] Patient with 6 beats of nonsustained V. tach, symptomatic, denies any complaints, watching TV, BMP is pending, will add magnesium level. Phillips Climes MD

## 2021-04-15 NOTE — Progress Notes (Signed)
Discharge instructions provided to and reviewed with patient.  Heart failure education reinforced.  PIV and telemetry removed.  Patient escorted to main entrance with belongings via wheelchair for transport home with girlfriend.  Angie Fava, RN

## 2021-04-15 NOTE — Plan of Care (Signed)

## 2021-04-15 NOTE — Progress Notes (Signed)
°   04/15/21 0442  Vitals  Temp 97.8 F (36.6 C)  Temp Source Oral  BP (!) 157/83  MAP (mmHg) 106  BP Location Right Arm  BP Method Automatic  Patient Position (if appropriate) Lying  Pulse Rate (!) 58  Pulse Rate Source Monitor  Resp 18  Level of Consciousness  Level of Consciousness Alert  MEWS COLOR  MEWS Score Color Green  Oxygen Therapy  SpO2 94 %  O2 Device Nasal Cannula  O2 Flow Rate (L/min) 3 L/min  Pain Assessment  Pain Scale 0-10  Pain Score 0  MEWS Score  MEWS Temp 0  MEWS Systolic 0  MEWS Pulse 0  MEWS RR 0  MEWS LOC 0  MEWS Score 0  Provider Notification  Provider Name/Title Dr. Noelle Penner  Date Provider Notified 04/15/21  Time Provider Notified [phone]  Notification Type Page (secure chat)  Notification Reason Other (Comment) (pt has 6 beats non sustain Vtach, pt asymptomatic, no c/o pain.)  Provider response See new orders  Date of Provider Response 04/15/21  Time of Provider Response 0456   Pt alert, resting in bed comfortably, no c/o of pain, no s/s of respiratory distress and will continue plan of care.

## 2021-04-15 NOTE — TOC Transition Note (Signed)
Transition of Care [facility]) - CM/SW Discharge Note   Patient Details  Name: Jesse Lane MRN: [id number] Date of Birth: 1955-08-26  [facility] CM/SW Contact:  Ross Ludwig, LCSW Phone Number: 04/15/2021, 12:15 PM   Clinical Narrative:     Patient will not qualify for home health services.  CSW signing off, no other needs.  Final next level of care: Home/Self Care Barriers to Discharge: Barriers Resolved   Patient Goals and CMS Choice Patient states their goals for this hospitalization and ongoing recovery are:: "get around better"      Discharge Placement                       Discharge Plan and Services In-house Referral: Clinical Social Work Discharge Planning Services: CM Consult Post Acute Care Choice:  (TBD)                    HH Arranged: Disease Management HH Agency: [place] Date [facility] Agency Contacted: 04/14/21 Time [place]: [phone] Representative spoke with at [place]: [email or web address]  Social Determinants of Health ([place]) Interventions     Readmission Risk Interventions Readmission Risk Prevention Plan 03/10/2021  Transportation Screening Complete  HRI or [place] Complete  Social Work Consult for [place] Planning/Counseling Complete  Palliative Care Screening Complete  Medication Review [employer]) Complete  Some recent data might be hidden

## 2021-04-15 NOTE — Discharge Instructions (Signed)

## 2021-04-15 NOTE — Progress Notes (Signed)
SATURATION QUALIFICATIONS: (This note is used to comply with regulatory documentation for home oxygen)  Patient Saturations on Room Air at Rest = 95%  Patient Saturations on Room Air while Ambulating = 92%  Angie Fava, RN

## 2021-05-03 ENCOUNTER — Inpatient Hospital Stay (HOSPITAL_COMMUNITY)
Admission: EM | Admit: 2021-05-03 | Discharge: 2021-05-05 | DRG: 291 | Disposition: A | Discharge status: Home / Self Care | Payer: Medicare Other | Attending: Internal Medicine | Admitting: Internal Medicine

## 2021-05-03 ENCOUNTER — Encounter (HOSPITAL_COMMUNITY): Payer: Self-pay | Admitting: Emergency Medicine

## 2021-05-03 ENCOUNTER — Emergency Department (HOSPITAL_COMMUNITY): Payer: Medicare Other

## 2021-05-03 ENCOUNTER — Other Ambulatory Visit: Payer: Self-pay

## 2021-05-03 DIAGNOSIS — E1165 Type 2 diabetes mellitus with hyperglycemia: Secondary | ICD-10-CM | POA: Diagnosis present

## 2021-05-03 DIAGNOSIS — E876 Hypokalemia: Secondary | ICD-10-CM | POA: Diagnosis present

## 2021-05-03 DIAGNOSIS — Z85820 Personal history of malignant melanoma of skin: Secondary | ICD-10-CM

## 2021-05-03 DIAGNOSIS — N1831 Chronic kidney disease, stage 3a: Secondary | ICD-10-CM | POA: Diagnosis present

## 2021-05-03 DIAGNOSIS — Z87891 Personal history of nicotine dependence: Secondary | ICD-10-CM

## 2021-05-03 DIAGNOSIS — R778 Other specified abnormalities of plasma proteins: Secondary | ICD-10-CM

## 2021-05-03 DIAGNOSIS — Z794 Long term (current) use of insulin: Secondary | ICD-10-CM

## 2021-05-03 DIAGNOSIS — E1122 Type 2 diabetes mellitus with diabetic chronic kidney disease: Secondary | ICD-10-CM | POA: Diagnosis present

## 2021-05-03 DIAGNOSIS — R0902 Hypoxemia: Secondary | ICD-10-CM | POA: Diagnosis present

## 2021-05-03 DIAGNOSIS — C7880 Secondary malignant neoplasm of unspecified digestive organ: Secondary | ICD-10-CM | POA: Diagnosis present

## 2021-05-03 DIAGNOSIS — Z79899 Other long term (current) drug therapy: Secondary | ICD-10-CM

## 2021-05-03 DIAGNOSIS — I5032 Chronic diastolic (congestive) heart failure: Secondary | ICD-10-CM | POA: Diagnosis present

## 2021-05-03 DIAGNOSIS — D631 Anemia in chronic kidney disease: Secondary | ICD-10-CM | POA: Diagnosis present

## 2021-05-03 DIAGNOSIS — M7989 Other specified soft tissue disorders: Secondary | ICD-10-CM | POA: Diagnosis present

## 2021-05-03 DIAGNOSIS — I10 Essential (primary) hypertension: Secondary | ICD-10-CM | POA: Diagnosis not present

## 2021-05-03 DIAGNOSIS — I248 Other forms of acute ischemic heart disease: Secondary | ICD-10-CM | POA: Diagnosis present

## 2021-05-03 DIAGNOSIS — Z8711 Personal history of peptic ulcer disease: Secondary | ICD-10-CM

## 2021-05-03 DIAGNOSIS — R0603 Acute respiratory distress: Secondary | ICD-10-CM | POA: Diagnosis present

## 2021-05-03 DIAGNOSIS — E1149 Type 2 diabetes mellitus with other diabetic neurological complication: Secondary | ICD-10-CM

## 2021-05-03 DIAGNOSIS — I509 Heart failure, unspecified: Secondary | ICD-10-CM

## 2021-05-03 DIAGNOSIS — K219 Gastro-esophageal reflux disease without esophagitis: Secondary | ICD-10-CM | POA: Diagnosis present

## 2021-05-03 DIAGNOSIS — Z91041 Radiographic dye allergy status: Secondary | ICD-10-CM

## 2021-05-03 DIAGNOSIS — R7989 Other specified abnormal findings of blood chemistry: Secondary | ICD-10-CM | POA: Diagnosis present

## 2021-05-03 DIAGNOSIS — I440 Atrioventricular block, first degree: Secondary | ICD-10-CM | POA: Diagnosis present

## 2021-05-03 DIAGNOSIS — Z808 Family history of malignant neoplasm of other organs or systems: Secondary | ICD-10-CM

## 2021-05-03 DIAGNOSIS — C779 Secondary and unspecified malignant neoplasm of lymph node, unspecified: Secondary | ICD-10-CM | POA: Diagnosis present

## 2021-05-03 DIAGNOSIS — I5033 Acute on chronic diastolic (congestive) heart failure: Secondary | ICD-10-CM | POA: Diagnosis present

## 2021-05-03 DIAGNOSIS — I130 Hypertensive heart and chronic kidney disease with heart failure and stage 1 through stage 4 chronic kidney disease, or unspecified chronic kidney disease: Principal | ICD-10-CM | POA: Diagnosis present

## 2021-05-03 DIAGNOSIS — E785 Hyperlipidemia, unspecified: Secondary | ICD-10-CM | POA: Diagnosis present

## 2021-05-03 DIAGNOSIS — E119 Type 2 diabetes mellitus without complications: Secondary | ICD-10-CM | POA: Diagnosis not present

## 2021-05-03 DIAGNOSIS — E1140 Type 2 diabetes mellitus with diabetic neuropathy, unspecified: Secondary | ICD-10-CM | POA: Diagnosis present

## 2021-05-03 DIAGNOSIS — Z7983 Long term (current) use of bisphosphonates: Secondary | ICD-10-CM | POA: Diagnosis not present

## 2021-05-03 DIAGNOSIS — Z20822 Contact with and (suspected) exposure to covid-19: Secondary | ICD-10-CM | POA: Diagnosis present

## 2021-05-03 DIAGNOSIS — Z833 Family history of diabetes mellitus: Secondary | ICD-10-CM

## 2021-05-03 LAB — TROPONIN I (HIGH SENSITIVITY)
Troponin I (High Sensitivity): 21 ng/L — ABNORMAL HIGH (ref <18)
Troponin I (High Sensitivity): 25 ng/L — ABNORMAL HIGH (ref <18)

## 2021-05-03 LAB — COMPREHENSIVE METABOLIC PANEL
ALT: 11 U/L (ref 0–44)
AST: 14 U/L — ABNORMAL LOW (ref 15–41)
Albumin: 3.6 g/dL (ref 3.5–5.0)
Alkaline Phosphatase: 61 U/L (ref 38–126)
Anion gap: 12 (ref 5–15)
BUN: 22 mg/dL (ref 8–23)
CO2: 27 mmol/L (ref 22–32)
Calcium: 8.8 mg/dL — ABNORMAL LOW (ref 8.9–10.3)
Chloride: 99 mmol/L (ref 98–111)
Creatinine, Ser: 1.58 mg/dL — ABNORMAL HIGH (ref 0.61–1.24)
GFR, Estimated: 48 mL/min — ABNORMAL LOW (ref >60)
Glucose, Bld: 313 mg/dL — ABNORMAL HIGH (ref 70–99)
Potassium: 3.4 mmol/L — ABNORMAL LOW (ref 3.5–5.1)
Sodium: 138 mmol/L (ref 135–145)
Total Bilirubin: 1.7 mg/dL — ABNORMAL HIGH (ref 0.3–1.2)
Total Protein: 6.1 g/dL — ABNORMAL LOW (ref 6.5–8.1)

## 2021-05-03 LAB — CBC
HCT: 34.3 % — ABNORMAL LOW (ref 39.0–52.0)
Hemoglobin: 11.7 g/dL — ABNORMAL LOW (ref 13.0–17.0)
MCH: 29.3 pg (ref 26.0–34.0)
MCHC: 34.1 g/dL (ref 30.0–36.0)
MCV: 86 fL (ref 80.0–100.0)
Platelets: 192 10*3/uL (ref 150–400)
RBC: 3.99 MIL/uL — ABNORMAL LOW (ref 4.22–5.81)
RDW: 13.5 % (ref 11.5–15.5)
WBC: 4.6 10*3/uL (ref 4.0–10.5)
nRBC: 0 % (ref 0.0–0.2)

## 2021-05-03 LAB — I-STAT VENOUS BLOOD GAS, ED
Acid-Base Excess: 4 mmol/L — ABNORMAL HIGH (ref 0.0–2.0)
Bicarbonate: 27.9 mmol/L (ref 20.0–28.0)
Calcium, Ion: 1.09 mmol/L — ABNORMAL LOW (ref 1.15–1.40)
HCT: 34 % — ABNORMAL LOW (ref 39.0–52.0)
Hemoglobin: 11.6 g/dL — ABNORMAL LOW (ref 13.0–17.0)
O2 Saturation: 80 %
Potassium: 3.4 mmol/L — ABNORMAL LOW (ref 3.5–5.1)
Sodium: 139 mmol/L (ref 135–145)
TCO2: 29 mmol/L (ref 22–32)
pCO2, Ven: 38.2 mmHg — ABNORMAL LOW (ref 44.0–60.0)
pH, Ven: 7.472 — ABNORMAL HIGH (ref 7.250–7.430)
pO2, Ven: 41 mmHg (ref 32.0–45.0)

## 2021-05-03 LAB — BRAIN NATRIURETIC PEPTIDE: B Natriuretic Peptide: 491.2 pg/mL — ABNORMAL HIGH (ref 0.0–100.0)

## 2021-05-03 MED ORDER — ACETAMINOPHEN 650 MG RE SUPP: 650.0000 mg | Freq: Four times a day (QID) | RECTAL | Status: DC | PRN

## 2021-05-03 MED ORDER — ACETAMINOPHEN 325 MG PO TABS: 650.0000 mg | ORAL_TABLET | Freq: Four times a day (QID) | ORAL | Status: DC | PRN

## 2021-05-03 MED ORDER — FUROSEMIDE 10 MG/ML IJ SOLN
40.0000 mg | Freq: Two times a day (BID) | INTRAMUSCULAR | Status: DC
  Administered 2021-05-04 – 2021-05-05 (×3): 40 mg via INTRAVENOUS
  Filled 2021-05-03 (×3): qty 4

## 2021-05-03 MED ORDER — FUROSEMIDE 10 MG/ML IJ SOLN
40.0000 mg | Freq: Once | INTRAMUSCULAR | Status: AC
  Administered 2021-05-03: 22:00:00 40 mg via INTRAVENOUS
  Filled 2021-05-03: qty 4

## 2021-05-03 NOTE — ED Notes (Signed)
PrimoFit placed, pt tolerated well.

## 2021-05-03 NOTE — ED Provider Notes (Signed)
[facility] EMERGENCY DEPARTMENT Provider Note   CSN: [id number] Arrival date & time: 05/03/21  1943     History  Chief Complaint  Patient presents with   Chest Pain   Leg Swelling    Jesse Lane is a 66 y.o. male history of CHF, here presenting with chest pressure.  Patient states that he woke up this morning and had chest pressure.  He states that he had tightness in his chest.  Denies any chest pain.  Patient does have pitting edema but states that he has been compliant with his Lasix.  In fact his Lasix was increased from 40 mg to 80 mg recently by his PCP. Patient was given aspirin 324 mg by EMS and 1 nitro but felt dizzy so refused a second nitro.  Patient states that he is pain-free now.  He denies any history of CAD but does have history of heart failure and kidney failure.  The history is provided by the patient.      Home Medications Prior to Admission medications   Medication Sig Start Date End Date Taking? Authorizing Provider  alendronate (FOSAMAX) 70 MG tablet Take 70 mg by mouth once a week. Wednesday's 04/09/21   [provider]  amLODipine (NORVASC) 10 MG tablet Take 10 mg by mouth daily. 11/28/19   [provider]  Brimonidine Tartrate (LUMIFY) 0.025 % SOLN Place 1 drop into both eyes 2 (two) times daily as needed (irritated/dry eyes).    [provider]  furosemide (LASIX) 40 MG tablet 40 mg daily 04/15/21   Wendling, Crosby Oyster, DO  insulin degludec (TRESIBA FLEXTOUCH) 100 UNIT/ML FlexTouch Pen Inject 25 Units into the skin daily before breakfast.    [provider]  insulin lispro (HUMALOG) 100 UNIT/ML KwikPen Inject 7-15 Units into the skin See admin instructions. 7 units in the morning, 7 units at lunch, and 8 units in the evening. May take additional units as needed, max of 50 units per day    [provider]  Insulin Pen Needle (BD PEN NEEDLE MICRO U/F) 32G X 6 MM MISC 1 each by Other route See  admin instructions. Use one pen needle to inject insulin 4 times daily. **must have appointment for additional refills.** 06/30/18   Renato Shin, MD  Insulin Pen Needle (BD PEN NEEDLE MICRO U/F) 32G X 6 MM MISC USE 1 PEN NEEDLE SIX TIMES DAILY Patient taking differently: 1 each by Other route See admin instructions. USE 1 PEN NEEDLE SIX TIMES DAILY 07/02/18   Renato Shin, MD  lisinopril (PRINIVIL,ZESTRIL) 40 MG tablet Take 40 mg by mouth at bedtime.  07/24/16   [provider]  montelukast (SINGULAIR) 10 MG tablet Take 10 mg by mouth at bedtime. 10/13/19   [provider]  Multiple Vitamin (MULTIVITAMIN WITH MINERALS) TABS tablet Take 1 tablet by mouth daily.    [provider]  Nebivolol HCl (BYSTOLIC) 20 MG TABS Take 20 mg by mouth at bedtime.    [provider]  ondansetron (ZOFRAN) 4 MG tablet Take 1 tablet (4 mg total) by mouth every 6 (six) hours as needed for nausea. 09/21/20   Levin Erp, PA  pantoprazole (PROTONIX) 40 MG tablet Take 1 tablet by mouth twice daily Patient taking differently: Take 40 mg by mouth daily as needed (heartburn). 05/04/20   Milus Banister, MD  potassium chloride SA (KLOR-CON M) 20 MEQ tablet 20 mEq daily 04/15/21   Shelda Pal, DO  pravastatin (PRAVACHOL)  20 MG tablet Take 20 mg by mouth daily. 11/05/20   [provider]  pregabalin (LYRICA) 100 MG capsule Take 200-300 mg by mouth See admin instructions. Take 2 capsule (200 mg) by mouth every morning and take three capsules (300 mg) at bedtime 10/15/19   [provider]  traZODone (DESYREL) 100 MG tablet Take 100 mg by mouth at bedtime. 07/24/16   [provider]      Allergies    Contrast media [iodinated contrast media]    Review of Systems   Review of Systems  Cardiovascular:  Positive for chest pain.  All other systems reviewed and are negative.  Physical Exam Updated Vital Signs BP (!) 142/70    Pulse 60    Temp 98.5 F  (36.9 C) (Oral)    Resp 20    Ht 5\' 11"  (1.803 m)    Wt 88.5 kg    SpO2 96%    BMI 27.20 kg/m  Physical Exam Vitals and nursing note reviewed.  Constitutional:      Comments: Chronically ill  HENT:     Head: Normocephalic.  Eyes:     Extraocular Movements: Extraocular movements intact.     Pupils: Pupils are equal, round, and reactive to light.  Cardiovascular:     Rate and Rhythm: Normal rate and regular rhythm.     Heart sounds: Normal heart sounds.  Pulmonary:     Effort: Pulmonary effort is normal.     Comments: Diminished bilaterally.  No wheezing Abdominal:     General: Bowel sounds are normal.     Palpations: Abdomen is soft.  Musculoskeletal:     Cervical back: Normal range of motion and neck supple.     Comments: 1+ edema bilaterally  Skin:    General: Skin is warm.  Neurological:     General: No focal deficit present.     Mental Status: He is alert and oriented to person, place, and time.  Psychiatric:        Mood and Affect: Mood normal.        Behavior: Behavior normal.    ED Results / Procedures / Treatments   Labs (all labs ordered are listed, but only abnormal results are displayed) Labs Reviewed  CBC - Abnormal; Notable for the following components:      Result Value   RBC 3.99 (*)    Hemoglobin 11.7 (*)    HCT 34.3 (*)    All other components within normal limits  COMPREHENSIVE METABOLIC PANEL - Abnormal; Notable for the following components:   Potassium 3.4 (*)    Glucose, Bld 313 (*)    Creatinine, Ser 1.58 (*)    Calcium 8.8 (*)    Total Protein 6.1 (*)    AST 14 (*)    Total Bilirubin 1.7 (*)    GFR, Estimated 48 (*)    All other components within normal limits  BRAIN NATRIURETIC PEPTIDE - Abnormal; Notable for the following components:   B Natriuretic Peptide 491.2 (*)    All other components within normal limits  I-STAT VENOUS BLOOD GAS, ED - Abnormal; Notable for the following components:   pH, Ven 7.472 (*)    pCO2, Ven 38.2 (*)     Acid-Base Excess 4.0 (*)    Potassium 3.4 (*)    Calcium, Ion 1.09 (*)    HCT 34.0 (*)    Hemoglobin 11.6 (*)    All other components within normal limits  TROPONIN I (HIGH SENSITIVITY) -  Abnormal; Notable for the following components:   Troponin I (High Sensitivity) 21 (*)    All other components within normal limits  RESP PANEL BY RT-PCR (FLU A&B, COVID) ARPGX2  BLOOD GAS, VENOUS  TROPONIN I (HIGH SENSITIVITY)    EKG EKG Interpretation  Date/Time:  Wednesday May 03 2021 19:55:59 EST Ventricular Rate:  56 PR Interval:  240 QRS Duration: 100 QT Interval:  462 QTC Calculation: 446 R Axis:   110 Text Interpretation: Sinus rhythm Prolonged PR interval Consider right atrial enlargement Right axis deviation No significant change since last tracing Confirmed by Wandra Arthurs [phone]) on 05/03/2021 8:25:21 PM  Radiology DG Chest 2 View  Result Date: 05/03/2021 CLINICAL DATA:  Chest pain EXAM: CHEST - 2 VIEW COMPARISON:  Chest radiograph dated 04/14/2021. FINDINGS: Small bilateral pleural effusions with bibasilar atelectasis or infiltrate. No pneumothorax. Mild cardiomegaly with mild vascular congestion. No acute osseous pathology. IMPRESSION: 1. Small bilateral pleural effusions with bibasilar atelectasis or infiltrate. 2. Mild cardiomegaly with mild vascular congestion. Electronically Signed   By: Anner Crete M.D.   On: 05/03/2021 21:21    Procedures Procedures    CRITICAL CARE Performed by: Wandra Arthurs   Total critical care time: 30 minutes  Critical care time was exclusive of separately billable procedures and treating other patients.  Critical care was necessary to treat or prevent imminent or life-threatening deterioration.  Critical care was time spent personally by me on the following activities: development of treatment plan with patient and/or surrogate as well as nursing, discussions with consultants, evaluation of patient's response to treatment, examination of  patient, obtaining history from patient or surrogate, ordering and performing treatments and interventions, ordering and review of laboratory studies, ordering and review of radiographic studies, pulse oximetry and re-evaluation of patient's condition.   Medications Ordered in ED Medications  furosemide (LASIX) injection 40 mg (40 mg Intravenous Given 05/03/21 2214)    ED Course/ Medical Decision Making/ A&P                           Medical Decision Making TULIO FACUNDO is a 66 y.o. male here presenting with chest pressure.  Patient does have history of heart failure but no known CAD or stents.  Patient does have mild pitting edema.  Considered CHF exacerbation versus ACS.  I doubt PE or dissection.  Plan to get CBC and CMP and troponin x2 and BNP.  We will also get chest x-ray.  Patient received aspirin prior to arrival.  11:10 PM Patient's labs reviewed.  His BNP is elevated at 500.  Patient's chest x-ray showed vascular congestion.  Patient was taken off of oxygen and desatted to 84%.  Patient was given Lasix for CHF exacerbation.  At this point will admit for hypoxia secondary to CHF exacerbation.  Problems Addressed: Acute on chronic congestive heart failure, unspecified heart failure type [facility]): acute illness or injury Hypoxia: acute illness or injury  Amount and/or Complexity of Data Reviewed Independent Historian: EMS External Data Reviewed: labs, ECG and notes. Labs: ordered. Decision-making details documented in ED Course. Radiology: ordered and independent interpretation performed. Decision-making details documented in ED Course. ECG/medicine tests: ordered and independent interpretation performed. Decision-making details documented in ED Course.  Risk Prescription drug management. Decision regarding hospitalization.  Final Clinical Impression(s) / ED Diagnoses Final diagnoses:  Acute on chronic congestive heart failure, unspecified heart failure type ([place])  Hypoxia     Rx / DC Orders  ED Discharge Orders     None         Drenda Freeze, MD 05/03/21 2311

## 2021-05-03 NOTE — ED Triage Notes (Addendum)
According to EMS, pt from home called out tonight for "chest tightness" that started at 4am and bilateral lower 3+ pitting edema.  Pt reports being compliant w/ medications.    Was given 324 ASA in route.  Also given 1 nitro which dropped pressure "30 points" resulting in no change in pain so pt refused second nitro.  20 G L AC CBG 397

## 2021-05-03 NOTE — H&P (Signed)
History and Physical    PLEASE NOTE THAT DRAGON DICTATION SOFTWARE WAS USED IN THE CONSTRUCTION OF THIS NOTE.   Jesse Lane WJX:[id number] DOB: April 02, 1956 DOA: 05/03/2021  PCP: [name], MD  Patient coming from: home   I have personally briefly reviewed patient's old medical records in [place]  Chief Complaint: Shortness of breath  HPI: Jesse Lane is a 66 y.o. male with medical history significant for chronic diastolic heart failure, type 2 diabetes mellitus, essential pretension, stage IIIa chronic kidney disease associated with baseline creatinine in 1.3-1.6, who is admitted to [facility] on 05/03/2021 with acute on chronic diastolic heart failure after presenting from home to [facility] ED complaining of shortness of breath.   The patient presents with 2 to 3 days of progressive shortness of breath associated with orthopnea as well as worsening of his chronic edema involving the bilateral lower extremities.  Denies any associated chest pain, diaphoresis, palpitations, nausea, vomiting, presyncope, or syncope. Not associated with any recent cough, wheezing, hemoptysis, new lower extremity erythema, or calf tenderness. Denies any recent trauma, travel.   Denies any associated subjective fever, chills, rigors, or generalized myalgias. No recent headache, neck stiffness, rhinitis, rhinorrhea, sore throat, abdominal pain, diarrhea, or rash.   He confirms a history of chronic diastolic heart failure, as well as confirming outstanding compliance with his outpatient diuretic regimen which consists of Lasix 40 mg p.o. twice daily, in the absence of any recently missed doses thereof.  Per chart review, most recent echocardiogram occurred on 04/13/2021 it was notable for LVEF 60 to 65%, no focal wall motion abnormalities, while showing evidence of mild LVH indeterminate diastolic parameters, and trivial mitral regurgitation.  Of note, echocardiogram from December 2022 was  notable for evidence of grade 2 diastolic dysfunction.  Per chart review, medical history is also notable for stage IIIa chronic kidney disease associated with baseline creatinine range 1.3-1.6.  He denies any known chronic underlying pulmonary conditions, and also denies any known chronic supplemental oxygen requirements.     ED Course:  Vital signs in the ED were notable for the following: Afebrile; heart rate 55-60, blood pressure 142/70; respiratory rate 17-19, initial oxygen saturation 84% on room air, which subsequently proved to 94 to 95% on 2 L nasal cannula.  Labs were notable for the following: CMP was notable for the following: Potassium 3.4, bicarbonate 27, creatinine 1.58 relative to most recent prior creatinine data point of 1.47 on 04/15/2021, glucose 313.  BNP 490 compared to 740 on 04/12/2021.  High-sensitivity troponin high initially noted to be 21, with repeat value trending up slightly to 25, which is relative to most recent prior high-sensitivity troponin I value of 13 in November 2022.  CBC notable for white blood cell count 4600.  COVID-19/influenza PCR found to be negative.  Imaging and additional notable ED work-up: EKG shows sinus rhythm with heart rate 56, and no evidence of T wave or ST changes, including no evidence of ST elevation.  Chest x-ray, in comparison to most recent prior plain films of the chest from 04/14/2021, showed interval development of pulmonary vascular congestion as well as interval development of small bilateral pleural effusions with bibasilar atelectasis versus infiltrate in the absence of evidence of pneumothorax.  While in the ED, the following were administered: Lasix 40 mg IV x1.     Review of Systems: As per HPI otherwise 10 point review of systems negative.   Past Medical History:  Diagnosis Date  Arthritis    Diabetes mellitus type 2, uncontrolled    Diabetic neuropathy (HCC)    GERD (gastroesophageal reflux disease)    History of  stomach ulcers    Hypertension    Melanoma ([place])    mets to lymph node and intestines    Past Surgical History:  Procedure Laterality Date   ABCESS DRAINAGE     gluteal area   COLONOSCOPY     INGUINAL HERNIA REPAIR N/A 08/15/2015   Procedure: HERNIA REPAIR INGUINAL INCARCERATED;  Surgeon: Alphonsa Overall, MD;  Location: [facility] ORS;  Service: General;  Laterality: N/A;  with MESH   pyloric stenosis     TONSILLECTOMY      Social History:  reports that he has quit smoking. His smoking use included cigarettes. He has a 5.00 pack-year smoking history. He has never used smokeless tobacco. He reports current alcohol use of about 2.0 standard drinks per week. He reports that he does not currently use drugs after having used the following drugs: Marijuana.   Allergies  Allergen Reactions   Contrast Media [Iodinated Contrast Media] Itching    Family History  Problem Relation Age of Onset   Breast cancer Mother    Bone cancer Mother    Melanoma Mother    Prostate cancer Father    Pancreatic cancer Maternal Grandfather    Diabetes Paternal Aunt    Colon cancer Neg Hx    Colon polyps Neg Hx    Esophageal cancer Neg Hx    Stomach cancer Neg Hx     Family history reviewed and not pertinent    Prior to Admission medications   Medication Sig Start Date End Date Taking? Authorizing Provider  alendronate (FOSAMAX) 70 MG tablet Take 70 mg by mouth once a week. Wednesday's 04/09/21   [provider]  amLODipine (NORVASC) 10 MG tablet Take 10 mg by mouth daily. 11/28/19   [provider]  Brimonidine Tartrate (LUMIFY) 0.025 % SOLN Place 1 drop into both eyes 2 (two) times daily as needed (irritated/dry eyes).    [provider]  furosemide (LASIX) 40 MG tablet 40 mg daily 04/15/21   Wendling, Crosby Oyster, DO  insulin degludec (TRESIBA FLEXTOUCH) 100 UNIT/ML FlexTouch Pen Inject 25 Units into the skin daily before breakfast.    [provider]  insulin lispro  (HUMALOG) 100 UNIT/ML KwikPen Inject 7-15 Units into the skin See admin instructions. 7 units in the morning, 7 units at lunch, and 8 units in the evening. May take additional units as needed, max of 50 units per day    [provider]  Insulin Pen Needle (BD PEN NEEDLE MICRO U/F) 32G X 6 MM MISC 1 each by Other route See admin instructions. Use one pen needle to inject insulin 4 times daily. **must have appointment for additional refills.** 06/30/18   Renato Shin, MD  Insulin Pen Needle (BD PEN NEEDLE MICRO U/F) 32G X 6 MM MISC USE 1 PEN NEEDLE SIX TIMES DAILY Patient taking differently: 1 each by Other route See admin instructions. USE 1 PEN NEEDLE SIX TIMES DAILY 07/02/18   Renato Shin, MD  lisinopril (PRINIVIL,ZESTRIL) 40 MG tablet Take 40 mg by mouth at bedtime.  07/24/16   [provider]  montelukast (SINGULAIR) 10 MG tablet Take 10 mg by mouth at bedtime. 10/13/19   [provider]  Multiple Vitamin (MULTIVITAMIN WITH MINERALS) TABS tablet Take 1 tablet by mouth daily.    [provider]  Nebivolol HCl (BYSTOLIC) 20 MG  TABS Take 20 mg by mouth at bedtime.    [provider]  ondansetron (ZOFRAN) 4 MG tablet Take 1 tablet (4 mg total) by mouth every 6 (six) hours as needed for nausea. 09/21/20   Levin Erp, PA  pantoprazole (PROTONIX) 40 MG tablet Take 1 tablet by mouth twice daily Patient taking differently: Take 40 mg by mouth daily as needed (heartburn). 05/04/20   Milus Banister, MD  potassium chloride SA (KLOR-CON M) 20 MEQ tablet 20 mEq daily 04/15/21   Shelda Pal, DO  pravastatin (PRAVACHOL) 20 MG tablet Take 20 mg by mouth daily. 11/05/20   [provider]  pregabalin (LYRICA) 100 MG capsule Take 200-300 mg by mouth See admin instructions. Take 2 capsule (200 mg) by mouth every morning and take three capsules (300 mg) at bedtime 10/15/19   [provider]  traZODone (DESYREL) 100 MG tablet Take 100 mg by  mouth at bedtime. 07/24/16   [provider]     Objective    Physical Exam: Vitals:   05/03/21 2230 05/03/21 2245 05/03/21 2300 05/03/21 2304  BP: (!) 151/81 (!) 151/75 (!) 142/70   Pulse: (!) 58 (!) 58 60   Resp: 13 14 20    Temp:    98.5 F (36.9 C)  TempSrc:    Oral  SpO2: (!) 84% (!) 84% 96%   Weight:      Height:        General: appears to be stated age; alert, oriented; mildly increased work of breathing noted Skin: warm, dry, no rash Head:  AT/[place] Mouth:  Oral mucosa membranes appear moist, normal dentition Neck: supple; trachea midline Heart:  RRR; did not appreciate any M/R/G Lungs: CTAB, did not appreciate any wheezes, rales, or rhonchi Abdomen: + BS; soft, ND, NT Vascular: 2+ pedal pulses b/l; 2+ radial pulses b/l Extremities: 1-2+ edema in the bilateral lower extremities, no muscle wasting Neuro: strength and sensation intact in upper and lower extremities b/l    Labs on Admission: I have personally reviewed following labs and imaging studies  CBC: Recent Labs  Lab 05/03/21 2000 05/03/21 2013  WBC 4.6  --   HGB 11.7* 11.6*  HCT 34.3* 34.0*  MCV 86.0  --   PLT 192  --    Basic Metabolic Panel: Recent Labs  Lab 05/03/21 2000 05/03/21 2013  NA 138 139  K 3.4* 3.4*  CL 99  --   CO2 27  --   GLUCOSE 313*  --   BUN 22  --   CREATININE 1.58*  --   CALCIUM 8.8*  --    GFR: Estimated Creatinine Clearance: 49 mL/min (A) (by C-G formula based on SCr of 1.58 mg/dL (H)). Liver Function Tests: Recent Labs  Lab 05/03/21 2000  AST 14*  ALT 11  ALKPHOS 61  BILITOT 1.7*  PROT 6.1*  ALBUMIN 3.6   No results for input(s): LIPASE, AMYLASE in the last 168 hours. No results for input(s): AMMONIA in the last 168 hours. Coagulation Profile: No results for input(s): INR, PROTIME in the last 168 hours. Cardiac Enzymes: No results for input(s): CKTOTAL, CKMB, CKMBINDEX, TROPONINI in the last 168 hours. BNP (last 3 results) No results for  input(s): PROBNP in the last 8760 hours. HbA1C: No results for input(s): HGBA1C in the last 72 hours. CBG: No results for input(s): GLUCAP in the last 168 hours. Lipid Profile: No results for input(s): CHOL, HDL, LDLCALC, TRIG, CHOLHDL, LDLDIRECT in the last 72 hours.  Thyroid Function Tests: No results for input(s): TSH, T4TOTAL, FREET4, T3FREE, THYROIDAB in the last 72 hours. Anemia Panel: No results for input(s): VITAMINB12, FOLATE, FERRITIN, TIBC, IRON, RETICCTPCT in the last 72 hours. Urine analysis:    Component Value Date/Time   COLORURINE YELLOW 04/13/2021 1018   APPEARANCEUR CLEAR 04/13/2021 1018   LABSPEC 1.009 04/13/2021 1018   PHURINE 5.0 04/13/2021 1018   GLUCOSEU NEGATIVE 04/13/2021 1018   HGBUR SMALL (A) 04/13/2021 1018   BILIRUBINUR NEGATIVE 04/13/2021 1018   KETONESUR NEGATIVE 04/13/2021 1018   PROTEINUR 30 (A) 04/13/2021 1018   UROBILINOGEN 0.2 12/05/2011 1655   NITRITE NEGATIVE 04/13/2021 1018   LEUKOCYTESUR NEGATIVE 04/13/2021 1018    Radiological Exams on Admission: DG Chest 2 View  Result Date: 05/03/2021 CLINICAL DATA:  Chest pain EXAM: CHEST - 2 VIEW COMPARISON:  Chest radiograph dated 04/14/2021. FINDINGS: Small bilateral pleural effusions with bibasilar atelectasis or infiltrate. No pneumothorax. Mild cardiomegaly with mild vascular congestion. No acute osseous pathology. IMPRESSION: 1. Small bilateral pleural effusions with bibasilar atelectasis or infiltrate. 2. Mild cardiomegaly with mild vascular congestion. Electronically Signed   By: Anner Crete M.D.   On: 05/03/2021 21:21     EKG: Independently reviewed, with result as described above.    Assessment/Plan   Principal Problem:   Acute on chronic diastolic heart failure (HCC) Active Problems:   Essential hypertension   Insulin-requiring or dependent type II diabetes mellitus (HCC)   Hypokalemia   Elevated troponin   Stage 3a chronic kidney disease (CKD) (HCC)    #) Acute on chronic  diastolic heart failure: dx of acute decompensation on the basis of presenting 2 to 3 days of progressive shortness of breath associated with orthopnea and worsening of chronic edema involving the bilateral lower extremities, with chest x-ray showing interval development of pulmonary vascular congestion as well as interval development of small bilateral pleural effusions, and with presentation associated with acute hypoxic respiratory distress, as further detailed below. This is in the context of a known history of chronic diastolic heart failure, with most recent echocardiogram performed on 04/13/2021 notable for LVEF 60 to 65% as well as indeterminate diastolic parameters, with echocardiogram in December 2022 also notable for grade 2 diastolic dysfunction.   Etiology leading to presenting acutely decompensated heart failure unclear, particularly given patient's report of good compliance with home diuretic regimen consisting of Lasix 40 mg p.o. twice daily. I suspect that mildly elevated initial troponin is a consequence of underlying acutely decompensated heart failure as opposed to representing ACS causing presenting acute heart failure exacerbation, particularly in the absence of any recent CP, and with presenting EKG showing no evidence of acute ischemic changes.  There may be an additional contribution to mildly elevated troponin as a consequence of type II supply demand mismatch as a result of relative decline in tissue perfusion as a consequence of decrease in oxygen delivery capacity as a result of concomitant presenting acute hypoxic respiratory distress. however, will continue to evaluate with further trending of troponin and close monitoring on tele.  Additional outpatient cardiac medications notable for nebivolol. Will reduce home beta-blocker dose by half for now in the setting of presenting acutely decompensated heart failure.  Of note, patient received Lasix 40 mg IV x 1 while in the ED today.  Presentation warrants additional IV diuresis, as further detailed below, with close monitoring of ensuing renal function, electrolytes, and volume status. Will refrain from repeating echocardiogram, as it appears the patient most recently underwent echo approximately 3  weeks ago.     Plan: monitor strict I's & O's and daily weights. Monitor on telemetry. Monitor continuous pulse oximetry. Repeat BMP in the morning, including for monitoring trend of potassium, bicarbonate, and renal function in response to interval diuresis efforts. Add-on serum magnesium level, and repeat this level in the AM. Close monitoring of ensuing blood pressure response to diuresis efforts, including to help guide need for improvement in afterload reduction in order to optimize cardiac output. Trend troponin. Lasix 40 mg IV twice daily.  Continue beta-blocker, but at half outpatient dose, as further detailed above.  Potassium chloride 40 mEq p.o. x1 dose now followed by potassium chloride 20 mEq p.o. daily.       #) Acute hypoxic respiratory distress: in the context of acute respiratory symptoms and no known baseline supplemental O2 requirements, presenting O2 sat noted to be in the mid 80s, subsequent improving into the mid 90s on 2 L nasal cannula, thereby meeting criteria for acute hypoxic respiratory distress as opposed to acute hypoxic respiratory failure at this time. Appears to be on basis of acute on chronic diastolic heart failure as further detailed above. No known chronic underlying pulmonary conditions.  As noted above, presenting CXR also shows evidence of bibasilar atelectasis versus infiltrate.  Suspect these radiographic findings are consistent with aforementioned acutely decompensated heart failure, will also check procalcitonin to further evaluate for any element of contributory pneumonia.  In terms of other considered etiologies, ACS appears less likely at this time in the absence of any recent CP ,presenting  EKG showing no e/o acute ischemic process, and with mildly elevated high-sensitivity troponin I felt to be on the basis of type II supply demand mismatch as a consequence of acute on chronic diastolic heart failure, as above. Clinically, presentation is less suggestive of acute PE at this time. COVID-19/Influenza PCR were negative when checked today.   Plan: further evaluation/management of presenting acute on chronic diastolic heart failure, as above. Monitor continuous pulse ox with prn supplemental O2 to maintain O2 sats greater than or equal to 92%. monitor on telemetry. CMP/CBC in the AM. Check serum Mg and Phos levels. Check blood gas.  Check procalcitonin.      #) Hypokalemia: Mildly depressed serum potassium level is above.  In the setting of plan for additional IV diuresis, will warrant additional potassium supplementation.   Plan: Potassium chloride 40 mEq p.o. x1 dose now followed by potassium chloride 20 mEq p.o. daily.  Repeat BMP tomorrow.  Add on serum magnesium level.  Monitor on telemetry.         #) Type 2 Diabetes Mellitus: documented history of such. Home insulin regimen: Tresiba 25 units subcu every morning as well as sliding scale Humalog 3 times daily with meals. Home oral hypoglycemic agents: None. presenting blood sugar: 313 in the absence of any anion gap metabolic acidosis to suggest DKA.  Will resume a portion of outpatient basal insulin, as further quantified below   Plan: Lantus 10 units subcu every morning . accuchecks QAC and HS with low dose SSI.       #) Essential Hypertension: documented h/o such, with outpatient antihypertensive regimen including nebivolol, Norvasc, lisinopril, in addition to home Lasix regimen, as above.  SBP's in the ED today: In the 140s mmHg.  We will continue home lisinopril, beta-blocker at half home dose in the setting of acutely consider failure and closely monitor ensuing blood pressure, particularly in response to plan for IV  diuresis efforts, as outlined above.  Plan: Close monitoring of subsequent BP via routine VS. continuing lisinopril, half dose beta-blocker.  Hold home amlodipine for now while monitoring response to interval IV diuresis efforts.       #) CKD 3A: Documented history of such, associated with baseline creatinine range 1.3-1.6, presenting serum creatinine found to be consistent with this range.  Plan: Monitor strict I's and O's and daily weights.  Tempt avoid nephrotoxic agents.  Repeat BMP in the morning.      DVT prophylaxis: SCD's   Code Status: Full code Family Communication: none Disposition Plan: Per Rounding Team Consults called: none;  Admission status: Inpatient; cardiac telemetry  Warrants inpatient status on basis of need for further evaluation management of acute on chronic diastolic heart failure, including need for additional IV diuresis given that presenting acutely decompensated heart failure occurred in spite of good compliance with home oral Lasix regimen, as above.   PLEASE NOTE THAT DRAGON DICTATION SOFTWARE WAS USED IN THE CONSTRUCTION OF THIS NOTE.   Dickinson DO [facility]  From [place]   05/03/2021, 11:43 PM

## 2021-05-03 NOTE — ED Notes (Signed)
Pt O2 dropped several times to 80s, placed on 2 L [place],which returned it to WNL.  Provider aware

## 2021-05-04 ENCOUNTER — Encounter (HOSPITAL_COMMUNITY): Payer: Self-pay | Admitting: Internal Medicine

## 2021-05-04 DIAGNOSIS — I10 Essential (primary) hypertension: Secondary | ICD-10-CM

## 2021-05-04 DIAGNOSIS — E119 Type 2 diabetes mellitus without complications: Secondary | ICD-10-CM

## 2021-05-04 DIAGNOSIS — E876 Hypokalemia: Secondary | ICD-10-CM

## 2021-05-04 DIAGNOSIS — R778 Other specified abnormalities of plasma proteins: Secondary | ICD-10-CM | POA: Diagnosis present

## 2021-05-04 DIAGNOSIS — I5033 Acute on chronic diastolic (congestive) heart failure: Secondary | ICD-10-CM

## 2021-05-04 DIAGNOSIS — Z794 Long term (current) use of insulin: Secondary | ICD-10-CM

## 2021-05-04 DIAGNOSIS — N1831 Chronic kidney disease, stage 3a: Secondary | ICD-10-CM

## 2021-05-04 LAB — CBG MONITORING, ED
Glucose-Capillary: 171 mg/dL — ABNORMAL HIGH (ref 70–99)
Glucose-Capillary: 228 mg/dL — ABNORMAL HIGH (ref 70–99)
Glucose-Capillary: 314 mg/dL — ABNORMAL HIGH (ref 70–99)

## 2021-05-04 LAB — GLUCOSE, CAPILLARY
Glucose-Capillary: 351 mg/dL — ABNORMAL HIGH (ref 70–99)
Glucose-Capillary: 273 mg/dL — ABNORMAL HIGH (ref 70–99)

## 2021-05-04 LAB — CBC WITH DIFFERENTIAL/PLATELET
Abs Immature Granulocytes: 0.01 10*3/uL (ref 0.00–0.07)
Basophils Absolute: 0 10*3/uL (ref 0.0–0.1)
Basophils Relative: 1 %
Eosinophils Absolute: 0 10*3/uL (ref 0.0–0.5)
Eosinophils Relative: 1 %
HCT: 32.4 % — ABNORMAL LOW (ref 39.0–52.0)
Hemoglobin: 11.3 g/dL — ABNORMAL LOW (ref 13.0–17.0)
Immature Granulocytes: 0 %
Lymphocytes Relative: 31 %
Lymphs Abs: 1.1 10*3/uL (ref 0.7–4.0)
MCH: 29.9 pg (ref 26.0–34.0)
MCHC: 34.9 g/dL (ref 30.0–36.0)
MCV: 85.7 fL (ref 80.0–100.0)
Monocytes Absolute: 0.3 10*3/uL (ref 0.1–1.0)
Monocytes Relative: 8 %
Neutro Abs: 2.2 10*3/uL (ref 1.7–7.7)
Neutrophils Relative %: 59 %
Platelets: 170 10*3/uL (ref 150–400)
RBC: 3.78 MIL/uL — ABNORMAL LOW (ref 4.22–5.81)
RDW: 13.6 % (ref 11.5–15.5)
WBC: 3.7 10*3/uL — ABNORMAL LOW (ref 4.0–10.5)
nRBC: 0 % (ref 0.0–0.2)

## 2021-05-04 LAB — COMPREHENSIVE METABOLIC PANEL
ALT: 11 U/L (ref 0–44)
AST: 12 U/L — ABNORMAL LOW (ref 15–41)
Albumin: 3.3 g/dL — ABNORMAL LOW (ref 3.5–5.0)
Alkaline Phosphatase: 56 U/L (ref 38–126)
Anion gap: 8 (ref 5–15)
BUN: 19 mg/dL (ref 8–23)
CO2: 30 mmol/L (ref 22–32)
Calcium: 8.6 mg/dL — ABNORMAL LOW (ref 8.9–10.3)
Chloride: 103 mmol/L (ref 98–111)
Creatinine, Ser: 1.48 mg/dL — ABNORMAL HIGH (ref 0.61–1.24)
GFR, Estimated: 52 mL/min — ABNORMAL LOW (ref >60)
Glucose, Bld: 187 mg/dL — ABNORMAL HIGH (ref 70–99)
Potassium: 3.2 mmol/L — ABNORMAL LOW (ref 3.5–5.1)
Sodium: 141 mmol/L (ref 135–145)
Total Bilirubin: 1.7 mg/dL — ABNORMAL HIGH (ref 0.3–1.2)
Total Protein: 6.2 g/dL — ABNORMAL LOW (ref 6.5–8.1)

## 2021-05-04 LAB — RESP PANEL BY RT-PCR (FLU A&B, COVID) ARPGX2
Influenza A by PCR: NEGATIVE
Influenza B by PCR: NEGATIVE
SARS Coronavirus 2 by RT PCR: NEGATIVE

## 2021-05-04 LAB — MAGNESIUM: Magnesium: 1.7 mg/dL (ref 1.7–2.4)

## 2021-05-04 LAB — PROCALCITONIN: Procalcitonin: <0.1 ng/mL

## 2021-05-04 LAB — TROPONIN I (HIGH SENSITIVITY): Troponin I (High Sensitivity): 31 ng/L — ABNORMAL HIGH (ref <18)

## 2021-05-04 MED ORDER — LISINOPRIL 20 MG PO TABS
40.0000 mg | ORAL_TABLET | Freq: Every day | ORAL | Status: DC
  Administered 2021-05-04: 40 mg via ORAL
  Filled 2021-05-04: qty 2

## 2021-05-04 MED ORDER — MONTELUKAST SODIUM 10 MG PO TABS
10.0000 mg | ORAL_TABLET | Freq: Every day | ORAL | Status: DC
  Administered 2021-05-04: 10 mg via ORAL
  Filled 2021-05-04 (×2): qty 1

## 2021-05-04 MED ORDER — PRAVASTATIN SODIUM 10 MG PO TABS
20.0000 mg | ORAL_TABLET | Freq: Every day | ORAL | Status: DC
  Administered 2021-05-04 – 2021-05-05 (×2): 20 mg via ORAL
  Filled 2021-05-04 (×2): qty 2

## 2021-05-04 MED ORDER — DIPHENHYDRAMINE HCL 50 MG/ML IJ SOLN: 50.0000 mg | Freq: Once | INTRAMUSCULAR | Status: DC

## 2021-05-04 MED ORDER — DIPHENHYDRAMINE HCL 25 MG PO CAPS: 50.0000 mg | ORAL_CAPSULE | Freq: Once | ORAL | Status: DC

## 2021-05-04 MED ORDER — AMLODIPINE BESYLATE 5 MG PO TABS
5.0000 mg | ORAL_TABLET | Freq: Every day | ORAL | Status: DC
  Administered 2021-05-04 – 2021-05-05 (×2): 5 mg via ORAL
  Filled 2021-05-04 (×2): qty 1

## 2021-05-04 MED ORDER — POTASSIUM CHLORIDE CRYS ER 20 MEQ PO TBCR
20.0000 meq | EXTENDED_RELEASE_TABLET | Freq: Every day | ORAL | Status: DC
  Administered 2021-05-04 – 2021-05-05 (×2): 20 meq via ORAL
  Filled 2021-05-04 (×2): qty 1

## 2021-05-04 MED ORDER — PREDNISONE 50 MG PO TABS
50.0000 mg | ORAL_TABLET | Freq: Four times a day (QID) | ORAL | Status: DC
  Administered 2021-05-04 – 2021-05-05 (×2): 50 mg via ORAL
  Filled 2021-05-04 (×2): qty 1

## 2021-05-04 MED ORDER — POTASSIUM CHLORIDE CRYS ER 20 MEQ PO TBCR
40.0000 meq | EXTENDED_RELEASE_TABLET | Freq: Once | ORAL | Status: AC
  Administered 2021-05-04: 40 meq via ORAL
  Filled 2021-05-04: qty 2

## 2021-05-04 MED ORDER — INSULIN GLARGINE-YFGN 100 UNIT/ML ~~LOC~~ SOLN
10.0000 [IU] | Freq: Every day | SUBCUTANEOUS | Status: DC
  Administered 2021-05-04 – 2021-05-05 (×2): 10 [IU] via SUBCUTANEOUS
  Filled 2021-05-04 (×3): qty 0.1

## 2021-05-04 MED ORDER — TRAZODONE HCL 50 MG PO TABS
50.0000 mg | ORAL_TABLET | Freq: Every evening | ORAL | Status: DC | PRN
  Administered 2021-05-04: 50 mg via ORAL
  Filled 2021-05-04: qty 1

## 2021-05-04 MED ORDER — NEBIVOLOL HCL 5 MG PO TABS
5.0000 mg | ORAL_TABLET | Freq: Every day | ORAL | Status: DC
  Administered 2021-05-04: 5 mg via ORAL
  Filled 2021-05-04: qty 1

## 2021-05-04 MED ORDER — PREDNISONE 20 MG PO TABS
50.0000 mg | ORAL_TABLET | Freq: Four times a day (QID) | ORAL | Status: DC
  Filled 2021-05-04: qty 3

## 2021-05-04 MED ORDER — NEBIVOLOL HCL 10 MG PO TABS: 10.0000 mg | ORAL_TABLET | Freq: Every day | ORAL | Status: DC

## 2021-05-04 MED ORDER — INSULIN ASPART 100 UNIT/ML IJ SOLN
0.0000 [IU] | Freq: Three times a day (TID) | INTRAMUSCULAR | Status: DC
  Administered 2021-05-04: 5 [IU] via SUBCUTANEOUS
  Administered 2021-05-05: 7 [IU] via SUBCUTANEOUS

## 2021-05-04 MED ORDER — DAPAGLIFLOZIN PROPANEDIOL 10 MG PO TABS
10.0000 mg | ORAL_TABLET | Freq: Every day | ORAL | Status: DC
  Administered 2021-05-05: 10 mg via ORAL
  Filled 2021-05-04 (×2): qty 1

## 2021-05-04 MED ORDER — INSULIN ASPART 100 UNIT/ML IJ SOLN
0.0000 [IU] | Freq: Three times a day (TID) | INTRAMUSCULAR | Status: DC
  Administered 2021-05-04: 7 [IU] via SUBCUTANEOUS
  Administered 2021-05-04: 9 [IU] via SUBCUTANEOUS

## 2021-05-04 NOTE — ED Notes (Signed)
Up to chair to eat lunch. No complaints. No signs of distress

## 2021-05-04 NOTE — Plan of Care (Signed)

## 2021-05-04 NOTE — ED Notes (Signed)
Pt O2 maintained at 82%. Pt was sat up with no improvement. O2 increased to 3L [place]. O2 at 94%.

## 2021-05-04 NOTE — Progress Notes (Signed)
Heart Failure Nurse Navigator Progress Note  PCP: [name], MD PCP-Cardiologist: none (previously [place] in [place]) Admission Diagnosis: A/C CHF Admitted from: home  Presentation:   Jesse Lane presented 1/25 with increased SOB. Pt recently discharged from [facility] hospital 1/4-1/7. Pt resting in bed on room air, HOB 30. Patient interactive with interview process. Pt lives in a rental house with significant other and another roommate. Pt endorses increased fluid intake and high sodium intake with lunch meat sandwiches, sausage/bacon. Drinks around 80oz fluid daily. Pt voices concerns regarding CTA and reaction to contrast dye-- explained plan for pre-medication. Pt states he does not follow with cardiology care at [facility], therefore needs to get established with cardiology follow-up. Pt states he and s/o share car, she works floating hours. Agreeable to enrollment in [employer]. Pt had previously scheduled HV TOC clinic appt scheduled for 2/3 @ 9AM, plan to keep this appt.   ECHO/ LVEF: 60-65%, G2DD  Clinical Course:  Past Medical History:  Diagnosis Date   Arthritis    Diabetes mellitus type 2, uncontrolled    Diabetic neuropathy (HCC)    GERD (gastroesophageal reflux disease)    History of stomach ulcers    Hypertension    Melanoma ([place])    mets to lymph node and intestines     Social History   Socioeconomic History   Marital status: Significant Other    Spouse name: Tula Nakayama- S/O   Number of children: 1   Years of education: Not on file   Highest education level: Some college, no degree  Occupational History   Occupation: retired  Tobacco Use   Smoking status: Former    Packs/day: 1.00    Years: 8.00    Pack years: 8.00    Types: Cigarettes    Quit date: 1998    Years since quitting: 25.0   Smokeless tobacco: Never  Vaping Use   Vaping Use: Never used  Substance and Sexual Activity   Alcohol use: Yes    Alcohol/week: 4.0  standard drinks    Types: 4 Glasses of wine per week    Comment: occasionally   Drug use: Not Currently    Frequency: 7.0 times per week    Types: Marijuana    Comment: daily   Sexual activity: Not on file  Other Topics Concern   Not on file  Social History Narrative   Not on file   Social Determinants of Health   Financial Resource Strain: High Risk   Difficulty of Paying Living Expenses: Hard  Food Insecurity: No Food Insecurity   Worried About Running Out of Food in the Last Year: Never true   Ran Out of Food in the Last Year: Never true  Transportation Needs: No Transportation Needs   Lack of Transportation (Medical): No   Lack of Transportation (Non-Medical): No  Physical Activity: Not on file  Stress: Not on file  Social Connections: Not on file    High Risk Criteria for Readmission and/or Poor Patient Outcomes: Heart failure hospital admissions (last 6 months): 3  No Show rate: 8% Difficult social situation: yes Demonstrates medication adherence: yes Primary Language: English Literacy level: Able to read/write and comprehend. Wears reading glasses.  Education Assessment and Provision:  Detailed education and instructions provided on heart failure disease management including the following:  Signs and symptoms of Heart Failure When to call the physician Importance of daily weights Low sodium diet Fluid restriction Medication management Anticipated future follow-up appointments  Patient education given on each of the above topics.  Patient acknowledges understanding via teach back method and acceptance of all instructions.  Education Materials:  "Living Better With Heart Failure" Booklet, HF zone tool, & Daily Weight Tracker Tool.  Patient has scale at home: yes Patient has pill box at home: no   Barriers of Care:   -dietary indiscretions -optimization cardiac care  Considerations/Referrals:   Referral made to Heart Failure Pharmacist Stewardship:  yes, appreciated Referral made to Heart Failure CSW/NCM TOC: yes, appreciated Referral made to Heart & Vascular TOC clinic: yes, Friday 2/3 @ 9AM  Items for Follow-up on DC/TOC: -optimize -cont. HF education -medication compliance?   Pricilla Holm, MSN, RN Heart Failure Nurse Navigator [phone]

## 2021-05-04 NOTE — Progress Notes (Signed)
Patient ID: Jesse Lane, male   DOB: 11-Sep-1955, 66 y.o.   MRN: [id number]  PROGRESS NOTE    Jesse Lane  XBM:[id number] DOB: 1956/03/01 DOA: 05/03/2021 PCP: [name], MD   Brief Narrative:  66 year old male with history of chronic diastolic heart failure, diabetes mellitus type 2, essential hypertension, chronic renal disease stage IIIa presented with worsening shortness of breath.  On presentation, he was hypoxic on room air with saturations of 84% requiring supplemental oxygen.  BNP was 490.  COVID-19/influenza testing were negative.  Chest x-ray showed possible pulmonary vascular congestion.  He was treated with IV Lasix  Assessment & Plan:   Acute on chronic diastolic heart failure -Echo on 04/13/2021 showed EF of 60 to 65% with indeterminate diastolic parameters -Presented with worsening shortness of breath and chest x-ray showed possible pulmonary vascular congestion -Currently on IV Lasix 40 mg twice a day.  Strict input and output.  Daily weights.  Fluid restriction. -I have requested cardiology evaluation.  Continue lisinopril and beta-blockers  Hypoxia -Required 2 L oxygen via nasal cannula on presentation.  Currently on room air. -Possibly from above  Hypertension -Blood pressure currently stable.  Continue Lasix, lisinopril and nebivolol  Hyperlipidemia -Continue statin  Diabetes mellitus type 2 with hyperglycemia -Continue long-acting insulin along with CBGs with SSI  Hypokalemia -Replace.  Repeat a.m. labs  CKD stage IIIa -Creatinine currently stable.  Monitor  Anemia of chronic disease -Possibly from kidney disease.  Hemoglobin stable  DVT prophylaxis: Start Lovenox Code Status: Full Family Communication: None at bedside Disposition Plan: Status is: Inpatient  Remains inpatient appropriate because: Of need for IV diuretics.  Cardiology evaluation   Consultants: Cardiology  Procedures: None  Antimicrobials:  None   Subjective: Patient seen and examined at bedside.  Feels slightly better but still short of breath with exertion.  No overnight fever, chest pain, nausea or vomiting reported.  Objective: Vitals:   05/04/21 0045 05/04/21 0215 05/04/21 0521 05/04/21 0920  BP: (!) 144/71 (!) 144/70 (!) 135/57 (!) 142/73  Pulse: (!) 56 (!) 55 (!) 53 (!) 57  Resp: 16 18 20 15   Temp:   98.4 F (36.9 C)   TempSrc:   Oral   SpO2: 94% 96% 96% 96%  Weight:      Height:        Intake/Output Summary (Last 24 hours) at 05/04/2021 1029 Last data filed at 05/04/2021 0647 Gross per 24 hour  Intake --  Output 2600 ml  Net -2600 ml   Filed Weights   05/03/21 2005  Weight: 88.5 kg    Examination:  General exam: Appears calm and comfortable.  Currently on room air. Respiratory system: Bilateral decreased breath sounds at bases with basilar crackles Cardiovascular system: S1 & S2 heard, Rate controlled Gastrointestinal system: Abdomen is nondistended, soft and nontender. Normal bowel sounds heard. Extremities: No cyanosis, clubbing; lower extremity edema present Central nervous system: Alert and oriented. No focal neurological deficits. Moving extremities Skin: No rashes, lesions or ulcers Psychiatry: Judgement and insight appear normal. Mood & affect appropriate.     Data Reviewed: I have personally reviewed following labs and imaging studies  CBC: Recent Labs  Lab 05/03/21 2000 05/03/21 2013 05/04/21 0415  WBC 4.6  --  3.7*  NEUTROABS  --   --  2.2  HGB 11.7* 11.6* 11.3*  HCT 34.3* 34.0* 32.4*  MCV 86.0  --  85.7  PLT 192  --  027   Basic Metabolic Panel: Recent Labs  Lab 05/03/21 2000 05/03/21 2013 05/04/21 0415  NA 138 139 141  K 3.4* 3.4* 3.2*  CL 99  --  103  CO2 27  --  30  GLUCOSE 313*  --  187*  BUN 22  --  19  CREATININE 1.58*  --  1.48*  CALCIUM 8.8*  --  8.6*  MG  --   --  1.7   GFR: Estimated Creatinine Clearance: 52.3 mL/min (A) (by C-G formula based on SCr  of 1.48 mg/dL (H)). Liver Function Tests: Recent Labs  Lab 05/03/21 2000 05/04/21 0415  AST 14* 12*  ALT 11 11  ALKPHOS 61 56  BILITOT 1.7* 1.7*  PROT 6.1* 6.2*  ALBUMIN 3.6 3.3*   No results for input(s): LIPASE, AMYLASE in the last 168 hours. No results for input(s): AMMONIA in the last 168 hours. Coagulation Profile: No results for input(s): INR, PROTIME in the last 168 hours. Cardiac Enzymes: No results for input(s): CKTOTAL, CKMB, CKMBINDEX, TROPONINI in the last 168 hours. BNP (last 3 results) No results for input(s): PROBNP in the last 8760 hours. HbA1C: No results for input(s): HGBA1C in the last 72 hours. CBG: Recent Labs  Lab 05/04/21 0522 05/04/21 0926  GLUCAP 171* 228*   Lipid Profile: No results for input(s): CHOL, HDL, LDLCALC, TRIG, CHOLHDL, LDLDIRECT in the last 72 hours. Thyroid Function Tests: No results for input(s): TSH, T4TOTAL, FREET4, T3FREE, THYROIDAB in the last 72 hours. Anemia Panel: No results for input(s): VITAMINB12, FOLATE, FERRITIN, TIBC, IRON, RETICCTPCT in the last 72 hours. Sepsis Labs: Recent Labs  Lab 05/04/21 0415  PROCALCITON <0.10    Recent Results (from the past 240 hour(s))  Resp Panel by RT-PCR (Flu A&B, Covid) Nasopharyngeal Swab     Status: None   Collection Time: 05/03/21 11:10 PM   Specimen: Nasopharyngeal Swab; Nasopharyngeal(NP) swabs in vial transport medium  Result Value Ref Range Status   SARS Coronavirus 2 by RT PCR NEGATIVE NEGATIVE Final    Comment: (NOTE) SARS-CoV-2 target nucleic acids are NOT DETECTED.  The SARS-CoV-2 RNA is generally detectable in upper respiratory specimens during the acute phase of infection. The lowest concentration of SARS-CoV-2 viral copies this assay can detect is 138 copies/mL. A negative result does not preclude SARS-Cov-2 infection and should not be used as the sole basis for treatment or other patient management decisions. A negative result may occur with  improper specimen  collection/handling, submission of specimen other than nasopharyngeal swab, presence of viral mutation(s) within the areas targeted by this assay, and inadequate number of viral copies(<138 copies/mL). A negative result must be combined with clinical observations, patient history, and epidemiological information. The expected result is Negative.  Fact Sheet for Patients:  EntrepreneurPulse.com.au  Fact Sheet for Healthcare Providers:  IncredibleEmployment.be  This test is no t yet approved or cleared by the [place] FDA and  has been authorized for detection and/or diagnosis of SARS-CoV-2 by FDA under an Emergency Use Authorization (EUA). This EUA will remain  in effect (meaning this test can be used) for the duration of the COVID-19 declaration under Section 564(b)(1) of the Act, 21 U.S.C.section 360bbb-3(b)(1), unless the authorization is terminated  or revoked sooner.       Influenza A by PCR NEGATIVE NEGATIVE Final   Influenza B by PCR NEGATIVE NEGATIVE Final    Comment: (NOTE) The Xpert Xpress SARS-CoV-2/FLU/RSV plus assay is intended as an aid in the diagnosis of influenza from Nasopharyngeal swab specimens and should not be used as a  sole basis for treatment. Nasal washings and aspirates are unacceptable for Xpert Xpress SARS-CoV-2/FLU/RSV testing.  Fact Sheet for Patients: EntrepreneurPulse.com.au  Fact Sheet for Healthcare Providers: IncredibleEmployment.be  This test is not yet approved or cleared by the [place] FDA and has been authorized for detection and/or diagnosis of SARS-CoV-2 by FDA under an Emergency Use Authorization (EUA). This EUA will remain in effect (meaning this test can be used) for the duration of the COVID-19 declaration under Section 564(b)(1) of the Act, 21 U.S.C. section 360bbb-3(b)(1), unless the authorization is terminated or revoked.  Performed at [facility], [place] [address], [place], [place] [address]          Radiology Studies: DG Chest 2 View  Result Date: 05/03/2021 CLINICAL DATA:  Chest pain EXAM: CHEST - 2 VIEW COMPARISON:  Chest radiograph dated 04/14/2021. FINDINGS: Small bilateral pleural effusions with bibasilar atelectasis or infiltrate. No pneumothorax. Mild cardiomegaly with mild vascular congestion. No acute osseous pathology. IMPRESSION: 1. Small bilateral pleural effusions with bibasilar atelectasis or infiltrate. 2. Mild cardiomegaly with mild vascular congestion. Electronically Signed   By: Anner Crete M.D.   On: 05/03/2021 21:21        Scheduled Meds:  furosemide  40 mg Intravenous BID   insulin aspart  0-9 Units Subcutaneous TID WC   insulin glargine-yfgn  10 Units Subcutaneous Q breakfast   lisinopril  40 mg Oral QHS   montelukast  10 mg Oral QHS   nebivolol  10 mg Oral QHS   potassium chloride  20 mEq Oral Daily   pravastatin  20 mg Oral Daily   Continuous Infusions:        Aline August, MD [facility] 05/04/2021, 10:29 AM

## 2021-05-04 NOTE — Progress Notes (Signed)
Pt pending cardiac CTA tomorrow 05/05/21 at 12:30pm. Pt is allergic to contrast media and is prescribed 13 hr prep with prednisone x 3 doses and benadryl x 1 dose.  Please administer meds as follows:  50mg  prednisone 11:30p 05/04/21 (13 hr prior to scan) 50mg  prednisone 5:30a 05/05/21 (7 hr prior to scan) 50mg  prednisone + 50mg  benadryl 11:30a 05/05/21 (1 hr prior to scan)   Please maintain 18g PIV in the [facility] for contrast administration for scan Pt should remain NPO for 4 hr prior to scan (8:30a-12:30p) having ice and water in the meantime.   Marchia Bond RN Navigator Cardiac Imaging [facility] [phone] Office  [phone] Cell

## 2021-05-04 NOTE — Consult Note (Addendum)
Cardiology Consultation:   Patient ID: Jesse Lane MRN: [id number]; DOB: Aug 12, 1955  Admit date: 05/03/2021 Date of Consult: 05/04/2021  PCP:  [name], MD   [facility] Providers Cardiologist:  None   Followed by [facility] ([facility])  Patient Profile:   Jesse Lane is a 66 y.o. male with a hx of chronic diastolic heart failure, type 2 diabetes mellitus, prehypertension, stage IIIa chronic kidney disease associated with baseline creatinine 1.3-1.6 Who is being seen 05/04/2021 for the evaluation of congestive heart failure at the request of Dr. Starla Link.  History of Present Illness:   [name] has a known history of diastolic heart failure, first diagnosed in 10/2019 when an echocardiogram showed grade I diastolic dysfunction. Patient was later found to have grade II diastolic dysfunction on echocardiogram on 03/09/2021.  Patient has never had any ischemic evaluations, no known history of CAD.  Patient was recently admitted to [facility] from 04/12/2021 - 04/15/2021 for treatment of acute on chronic heart failure.  Patient was diuresed and sent home on Lasix 40 mg twice daily.  Echo at that admission showed LVEF 60-65%, normal LV/RV function, no regional wall motion abnormalities, indeterminate diastolic parameters.  Patient presented to the ED on 1/25 complaining of chest tightness, lower extremity edema.  Patient was hypoxic with sats in the 80s and started on 2 L of oxygen via nasal cannula with improvement.  Labs in the ED showed Na 138, K 3.4, creatinine 1.58 (similar to baseline), hemoglobin 117, hematocrit 34.3, WBC 4.6. HSTN 21>> 25>>31. BNP elevated to 491.2 (was 741.4 on 1/4).  Viral COVID/flue negative. Chest x-ray showed small bilateral pleural effusions with bibasilar atelectasis, mild cardiomegaly with mild vascular congestion. EKG showed sinus rhythm, prolonged PR (240 ms), RAD (present since 2018).  On interview, patient reports that he  has been more short of breath than usual for the past 2 to 3 days and has noticed increased ankle swelling.  Denies chest pain, palpitations, dizziness or lightheadedness.  Shortness of breath has improved since coming to ER.  Has been taking Lasix 80 mg at home, reports good compliance.  Past Medical History:  Diagnosis Date   Arthritis    Diabetes mellitus type 2, uncontrolled    Diabetic neuropathy (HCC)    GERD (gastroesophageal reflux disease)    History of stomach ulcers    Hypertension    Melanoma ([place])    mets to lymph node and intestines    Past Surgical History:  Procedure Laterality Date   ABCESS DRAINAGE     gluteal area   COLONOSCOPY     INGUINAL HERNIA REPAIR N/A 08/15/2015   Procedure: HERNIA REPAIR INGUINAL INCARCERATED;  Surgeon: Alphonsa Overall, MD;  Location: [facility];  Service: General;  Laterality: N/A;  with MESH   pyloric stenosis     TONSILLECTOMY       Home Medications:  Prior to Admission medications   Medication Sig Start Date End Date Taking? Authorizing Provider  acetaminophen (TYLENOL) 500 MG tablet Take 1,000 mg by mouth every 6 (six) hours as needed for mild pain.   Yes [provider]  alendronate (FOSAMAX) 70 MG tablet Take 70 mg by mouth once a week. Wednesday's 04/09/21  Yes [provider]  amLODipine (NORVASC) 10 MG tablet Take 10 mg by mouth daily. 11/28/19  Yes [provider]  furosemide (LASIX) 40 MG tablet 40 mg daily Patient taking differently: Take 40 mg by mouth 2 (two) times daily. 04/15/21  Yes Shelda Pal, DO  insulin degludec (TRESIBA FLEXTOUCH) 100 UNIT/ML FlexTouch Pen Inject 25 Units into the skin daily before breakfast.   Yes [provider]  insulin lispro (HUMALOG) 100 UNIT/ML KwikPen Inject 7-15 Units into the skin See admin instructions. 7 units in the morning, 7 units at lunch, and 8 units in the evening. May take additional units as needed, max of 50 units per day   Yes [provider]  lisinopril (PRINIVIL,ZESTRIL) 40 MG tablet Take 40 mg by mouth at bedtime.  07/24/16  Yes [provider]  MELATONIN PO Take 1 tablet by mouth at bedtime as needed (sleep).   Yes [provider]  montelukast (SINGULAIR) 10 MG tablet Take 10 mg by mouth at bedtime. 10/13/19  Yes [provider]  Multiple Vitamin (MULTIVITAMIN WITH MINERALS) TABS tablet Take 1 tablet by mouth daily.   Yes [provider]  Nebivolol HCl (BYSTOLIC) 20 MG TABS Take 20 mg by mouth at bedtime.   Yes [provider]  ondansetron (ZOFRAN) 4 MG tablet Take 1 tablet (4 mg total) by mouth every 6 (six) hours as needed for nausea. 09/21/20  Yes Levin Erp, PA  pantoprazole (PROTONIX) 40 MG tablet Take 1 tablet by mouth twice daily Patient taking differently: Take 40 mg by mouth daily as needed (heartburn). 05/04/20  Yes Milus Banister, MD  potassium chloride SA (KLOR-CON M) 20 MEQ tablet 20 mEq daily Patient taking differently: Take 20 mEq by mouth daily. 04/15/21  Yes Shelda Pal, DO  pravastatin (PRAVACHOL) 20 MG tablet Take 20 mg by mouth daily. 11/05/20  Yes [provider]  pregabalin (LYRICA) 100 MG capsule Take 200-300 mg by mouth See admin instructions. Take 2 capsule (200 mg) by mouth every morning and take three capsules (300 mg) at bedtime 10/15/19  Yes [provider]  traZODone (DESYREL) 100 MG tablet Take 100 mg by mouth at bedtime. 07/24/16  Yes [provider]  Brimonidine Tartrate (LUMIFY) 0.025 % SOLN Place 1 drop into both eyes 2 (two) times daily as needed (irritated/dry eyes).    [provider]  Insulin Pen Needle (BD PEN NEEDLE MICRO U/F) 32G X 6 MM MISC 1 each by Other route See admin instructions. Use one pen needle to inject insulin 4 times daily. **must have appointment for additional refills.** 06/30/18   Renato Shin, MD  Insulin Pen Needle (BD PEN NEEDLE MICRO U/F) 32G X 6 MM MISC USE 1 PEN  NEEDLE SIX TIMES DAILY Patient taking differently: 1 each by Other route See admin instructions. USE 1 PEN NEEDLE SIX TIMES DAILY 07/02/18   Renato Shin, MD    Inpatient Medications: Scheduled Meds:  furosemide  40 mg Intravenous BID   insulin aspart  0-9 Units Subcutaneous TID WC   insulin glargine-yfgn  10 Units Subcutaneous Q breakfast   lisinopril  40 mg Oral QHS   montelukast  10 mg Oral QHS   nebivolol  10 mg Oral QHS   potassium chloride  20 mEq Oral Daily   pravastatin  20 mg Oral Daily   Continuous Infusions:  PRN Meds: acetaminophen **OR** acetaminophen, traZODone  Allergies:    Allergies  Allergen Reactions   Contrast Media [Iodinated Contrast Media] Itching    Social History:   Social History   Socioeconomic History   Marital status: Single    Spouse name: Not on file   Number of children: 1   Years of education: Not on file  Highest education level: Not on file  Occupational History   Occupation: retired  Tobacco Use   Smoking status: Former    Packs/day: 1.00    Years: 5.00    Pack years: 5.00    Types: Cigarettes   Smokeless tobacco: Never  Vaping Use   Vaping Use: Never used  Substance and Sexual Activity   Alcohol use: Yes    Alcohol/week: 2.0 standard drinks    Types: 2 Glasses of wine per week    Comment: occasionally   Drug use: Not Currently    Types: Marijuana    Comment: daily   Sexual activity: Never  Other Topics Concern   Not on file  Social History Narrative   Not on file   Social Determinants of Health   Financial Resource Strain: Not on file  Food Insecurity: Not on file  Transportation Needs: Not on file  Physical Activity: Not on file  Stress: Not on file  Social Connections: Not on file  Intimate Partner Violence: Not on file    Family History:    Family History  Problem Relation Age of Onset   Breast cancer Mother    Bone cancer Mother    Melanoma Mother    Prostate cancer Father    Pancreatic cancer  Maternal Grandfather    Diabetes Paternal Aunt    Colon cancer Neg Hx    Colon polyps Neg Hx    Esophageal cancer Neg Hx    Stomach cancer Neg Hx      ROS:  Please see the history of present illness.   All other ROS reviewed and negative.     Physical Exam/Data:   Vitals:   05/04/21 0045 05/04/21 0215 05/04/21 0521 05/04/21 0920  BP: (!) 144/71 (!) 144/70 (!) 135/57 (!) 142/73  Pulse: (!) 56 (!) 55 (!) 53 (!) 57  Resp: 16 18 20 15   Temp:   98.4 F (36.9 C)   TempSrc:   Oral   SpO2: 94% 96% 96% 96%  Weight:      Height:        Intake/Output Summary (Last 24 hours) at 05/04/2021 0959 Last data filed at 05/04/2021 0647 Gross per 24 hour  Intake --  Output 2600 ml  Net -2600 ml   Last 3 Weights 05/03/2021 04/15/2021 04/14/2021  Weight (lbs) 195 lb 204 lb 12.9 oz 205 lb 4 oz  Weight (kg) 88.451 kg 92.9 kg 93.1 kg     Body mass index is 27.2 kg/m.  General:  Well nourished, well developed, in no acute distress HEENT: normal Neck: no JVD Vascular: Radial pulses 2+ bilaterally Cardiac:  normal S1, S2; bradycardic; no murmur  Lungs:  clear to auscultation bilaterally, no wheezing, rhonchi or rales  Abd: soft, nontender, no hepatomegaly  Ext: no edema in BLE Musculoskeletal:  No deformities Skin: warm and dry  Neuro:  CNs 2-12 intact, no focal abnormalities noted Psych:  Normal affect   EKG:  The EKG was personally reviewed and demonstrates:  sinus rhythm, prolonged PR (240 ms), RAD (present since 2018).  Telemetry:  Telemetry was personally reviewed and demonstrates:  sinus rhythm, occasional missed beats without non conducted p waves, prolonged PR, HR in 50s  Relevant CV Studies:  Echo 04/13/2021  1. Left ventricular ejection fraction, by estimation, is 60 to 65%. The  left ventricle has normal function. The left ventricle has no regional  wall motion abnormalities. There is mild left ventricular hypertrophy.  Left ventricular diastolic parameters  are  indeterminate.    2. Right ventricular systolic function is normal. The right ventricular  size is normal.   3. The mitral valve is normal in structure. Trivial mitral valve  regurgitation.   4. The aortic valve is normal in structure. Aortic valve regurgitation is  not visualized.   5. The inferior vena cava is dilated in size with <50% respiratory  variability, suggesting right atrial pressure of 15 mmHg.   Comparison(s): The left ventricular function is unchanged.   Laboratory Data:  High Sensitivity Troponin:   Recent Labs  Lab 05/03/21 2000 05/03/21 2213 05/04/21 0415  TROPONINIHS 21* 25* 31*     Chemistry Recent Labs  Lab 05/03/21 2000 05/03/21 2013 05/04/21 0415  NA 138 139 141  K 3.4* 3.4* 3.2*  CL 99  --  103  CO2 27  --  30  GLUCOSE 313*  --  187*  BUN 22  --  19  CREATININE 1.58*  --  1.48*  CALCIUM 8.8*  --  8.6*  MG  --   --  1.7  GFRNONAA 48*  --  52*  ANIONGAP 12  --  8    Recent Labs  Lab 05/03/21 2000 05/04/21 0415  PROT 6.1* 6.2*  ALBUMIN 3.6 3.3*  AST 14* 12*  ALT 11 11  ALKPHOS 61 56  BILITOT 1.7* 1.7*   Lipids No results for input(s): CHOL, TRIG, HDL, LABVLDL, LDLCALC, CHOLHDL in the last 168 hours.  Hematology Recent Labs  Lab 05/03/21 2000 05/03/21 2013 05/04/21 0415  WBC 4.6  --  3.7*  RBC 3.99*  --  3.78*  HGB 11.7* 11.6* 11.3*  HCT 34.3* 34.0* 32.4*  MCV 86.0  --  85.7  MCH 29.3  --  29.9  MCHC 34.1  --  34.9  RDW 13.5  --  13.6  PLT 192  --  170   Thyroid No results for input(s): TSH, FREET4 in the last 168 hours.  BNP Recent Labs  Lab 05/03/21 2000  BNP 491.2*    DDimer No results for input(s): DDIMER in the last 168 hours.   Radiology/Studies:  DG Chest 2 View  Result Date: 05/03/2021 CLINICAL DATA:  Chest pain EXAM: CHEST - 2 VIEW COMPARISON:  Chest radiograph dated 04/14/2021. FINDINGS: Small bilateral pleural effusions with bibasilar atelectasis or infiltrate. No pneumothorax. Mild cardiomegaly with mild vascular  congestion. No acute osseous pathology. IMPRESSION: 1. Small bilateral pleural effusions with bibasilar atelectasis or infiltrate. 2. Mild cardiomegaly with mild vascular congestion. Electronically Signed   By: Anner Crete M.D.   On: 05/03/2021 21:21     Assessment and Plan:   Acute on chronic diastolic heart failure  - Patient reported 2 to 3 days of progressive shortness of breath with orthopnea and worsening bilateral lower extremity edema - Chest x-ray showed pulmonary vascular congestion and small bilateral pleural effusions - Most recent echo on 1/5 showed LVEF 60-65%, indeterminate diastolic parameters.  Echocardiogram in December 2022 showed grade 2 diastolic dysfunction, moderate LVH - HSTN 21>>25>>31. Patient denies any chest pain.  Likely demand ischemia in the setting of fluid overload, related to decreased kidney function.  EKG does not show signs of ischemia. - Patient appeared euvolemic on exam today.  Reports continuing to have some shortness of breath.  - On lasix 40mg  IV BID, given 2 doses so far. Currently net -1.6 L (no recorded input, however).  Current weight 195 lbs, appears to be lower than usual weight (usually around 200 lbs). No increase  in creatinine. Will continue to diurese   - CT chest on 1/4 showed calcification of the coronary arteries. Recommend CT coronary to better assess, can be completed outpatient if not able to schedule in next two days - Add SGLT2i as patient also had DM  HTN  - On lisinopril, nebivolol, lasix - Due to prolonged PR interval, bradycardia (rate in 50s), will decrease nebivolol to 5 mg daily - Add amlodipine 5mg .  - BP slightly elevated this AM, but he was not given his BP medications last night before bed   HLD  - Continue statin   CKD stage IIIa - Creatinine stable (1.48, similar to baseline)  - Continue to monitor as patient is diuresed    Risk Assessment/Risk Scores:      New York Heart Association (NYHA) Functional  Class NYHA Class II        For questions or updates, please contact [facility] Please consult [email or web address] for contact info under    Signed, Margie Billet, PA-C  05/04/2021 9:59 AM  I have seen and examined the patient along with Margie Billet, PA-C .  I have reviewed the chart, notes and new data.  I agree with PA/NP's note.  Key new complaints: able to lie flat in bed, has not ambulated Key examination changes: clear lungs, no JVD, mild residual edema in pretibial area, R>L. He had chest pressure with the initial hospitalization in early January, this time just dyspnea. Key new findings / data: echo reviewed, mild LVH present, moderate LA dilation, Doppler highly consistent with decompensated diastolic HF. These findings were already present on ECG performed in early December 2021 for COVID-19. Echo in 2021 also showed similar findings, less severe elevation in LA pressure. BNP and CXR confirm acute HF. Creatinine is moderately abnormal, but stable w diuresis. Mild bradycardia and 1st degree AVB on ECG, without ischemic repol changes. Chest CT Apr 12, 2021 shows moderate atherosclerotic calcification in all three major coronaries and aortic atherosclerosis.   PLAN: - Needs additional diuresis. Most likely reason for readmission was incomplete diuresis. Suspect "dry weight" may be around 190 lb. - Excellent candidate for SGLT2 inh for DM mgmt, prevention of CKD progression and diastolic HF. Wilder Glade started. He has Medicaid. Reviewed increased risk of urinary/groin/pelvic infections that need to be very promptly reported and addressed. - Reduce nebivolol dose, continue ACEi and restart amlodipine that appears to have been stopped inadvertently. Target BP <130/80. - I think we need to evaluate for CAD which may be contributing to HF. Recommend coronary CT angio. This is not urgent, but maybe we can get by the time he is ready for DC - Lipid profile from 2021 showed very low  HDL 23, acceptable LDL in 90s - if we identify substantial coronary plaque or true coronary stenoses, will need more aggressive lipid lowering to LDL<70  Sanda Klein, MD, [facility] [phone] 05/04/2021, 12:44 PM

## 2021-05-04 NOTE — TOC Progression Note (Signed)
Transition of Care [facility]) - Progression Note    Patient Details  Name: MOXON MESSLER MRN: [id number] Date of Birth: 12/31/1955  Transition of Care [facility]) CM/SW Contact  Zenon Mayo, RN Phone Number: 05/04/2021, 2:58 PM  Clinical Narrative:     Transition of Care [facility]) Screening Note   Patient Details  Name: SIE FORMISANO Date of Birth: 1955/11/09   Transition of Care [facility]) CM/SW Contact:    Zenon Mayo, RN Phone Number: 05/04/2021, 2:58 PM    Transition of Care Department [facility], [facility], [facility]) has reviewed patient and no TOC needs have been identified at this time. We will continue to monitor patient advancement through interdisciplinary progression rounds. If new patient transition needs arise, please place a TOC consult.          Expected Discharge Plan and Services                                                 Social Determinants of Health (SDOH) Interventions    Readmission Risk Interventions Readmission Risk Prevention Plan 03/10/2021  Transportation Screening Complete  HRI or Home Care Consult Complete  Social Work Consult for [place] Planning/Counseling Complete  Palliative Care Screening Complete  Medication Review [employer]) Complete  Some recent data might be hidden

## 2021-05-04 NOTE — Progress Notes (Signed)
Patient arrived onto the unit from ED. Patient tele monitor applied and CCMD notified. VS obtained and stable. Patient's standing weight obtained. Bed in lowest position and call bell within reach.

## 2021-05-05 ENCOUNTER — Other Ambulatory Visit: Payer: Self-pay | Admitting: Student

## 2021-05-05 DIAGNOSIS — I2510 Atherosclerotic heart disease of native coronary artery without angina pectoris: Secondary | ICD-10-CM

## 2021-05-05 DIAGNOSIS — Z91041 Radiographic dye allergy status: Secondary | ICD-10-CM

## 2021-05-05 DIAGNOSIS — I5033 Acute on chronic diastolic (congestive) heart failure: Secondary | ICD-10-CM

## 2021-05-05 LAB — MAGNESIUM: Magnesium: 1.7 mg/dL (ref 1.7–2.4)

## 2021-05-05 LAB — GLUCOSE, CAPILLARY: Glucose-Capillary: 330 mg/dL — ABNORMAL HIGH (ref 70–99)

## 2021-05-05 LAB — BASIC METABOLIC PANEL
Anion gap: 9 (ref 5–15)
BUN: 16 mg/dL (ref 8–23)
CO2: 27 mmol/L (ref 22–32)
Calcium: 8.8 mg/dL — ABNORMAL LOW (ref 8.9–10.3)
Chloride: 101 mmol/L (ref 98–111)
Creatinine, Ser: 1.57 mg/dL — ABNORMAL HIGH (ref 0.61–1.24)
GFR, Estimated: 48 mL/min — ABNORMAL LOW (ref >60)
Glucose, Bld: 266 mg/dL — ABNORMAL HIGH (ref 70–99)
Potassium: 4 mmol/L (ref 3.5–5.1)
Sodium: 137 mmol/L (ref 135–145)

## 2021-05-05 MED ORDER — DIPHENHYDRAMINE HCL 50 MG PO CAPS: ORAL_CAPSULE | ORAL | 0 refills | Status: DC

## 2021-05-05 MED ORDER — ENOXAPARIN SODIUM 40 MG/0.4ML IJ SOSY
40.0000 mg | PREFILLED_SYRINGE | INTRAMUSCULAR | Status: DC
  Administered 2021-05-05: 40 mg via SUBCUTANEOUS
  Filled 2021-05-05: qty 0.4

## 2021-05-05 MED ORDER — PANTOPRAZOLE SODIUM 40 MG PO TBEC: 40.0000 mg | DELAYED_RELEASE_TABLET | Freq: Every day | ORAL | Status: DC | PRN

## 2021-05-05 MED ORDER — FUROSEMIDE 80 MG PO TABS: 80.0000 mg | ORAL_TABLET | Freq: Every day | ORAL | 0 refills | Status: DC

## 2021-05-05 MED ORDER — NEBIVOLOL HCL 5 MG PO TABS: 5.0000 mg | ORAL_TABLET | Freq: Every day | ORAL | 0 refills | Status: DC

## 2021-05-05 MED ORDER — PREDNISONE 50 MG PO TABS: ORAL_TABLET | ORAL | 0 refills | Status: DC

## 2021-05-05 MED ORDER — DAPAGLIFLOZIN PROPANEDIOL 10 MG PO TABS: 10.0000 mg | ORAL_TABLET | Freq: Every day | ORAL | 0 refills | Status: DC

## 2021-05-05 MED ORDER — AMLODIPINE BESYLATE 5 MG PO TABS: 5.0000 mg | ORAL_TABLET | Freq: Every day | ORAL | 0 refills | Status: DC

## 2021-05-05 NOTE — Plan of Care (Signed)
Nutrition Education Note:   Pt is a 66 y.o. male with medical hx significant for T2DM, diabetic neuropathy, GERD, HTN, melanoma who presented to the ED for SOB and was admitted to [facility] with acute on chronic diastolic heart failure.    Per documentation, meal completion of 100% for breakfast today.   Met with pt at bedside. Pt lying in bed, with eyes closed for most of visit, but willing to answer a few questions. Per pt, pt has a good appetite today and reports that he ate all of his breakfast today. Per pt, his appetite PTA is great. Pt reports that he typically has yogurt and fruit for breakfast and that his lunch meal is dependent on the day. Per pt, lunch is typically some type of home-cooked meal such as a sandwich/soup or sandwich/salad. 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 For dinner, pt reports that they cook a variety of foods, such as dishes with chicken. Per pt, he does not cook with salt at home and prefers to use fresh herbs and spices. Per pt, pt reports that "salt is the enemy."   Discussed the importance of consuming a healthful diet for healing and supporting heart health. Provided handout from the Academy of Nutrition and Dietetics, "Heart Healthy Nutrition Therapy," for pt. Discussed the importance of why it's important to be cognizant of sodium intake in regard to heart health. Encouraged pt to continue consuming breakfast foods such as yogurt and fresh fruit. Encouraged pt to read food labels when shopping to keep track of how much sodium is in the foods that he eats. Encouraged pt to continue cooking at home with fresh herbs and spices, versus salt. Discussed the importance of checking weight daily in regard to fluid retention.   Teach back method used. Expect fair compliance.    Per weight encounters, pt has lost ~7 kg of body weight since 04/15/21. Suspect weight documented on 04/15/21 could be due to fluid accumulation.    Medications  reviewed: Lasix, novolog 9 units, semglee 10 units    Labs reviewed: CBGs 228 - 330 x 24 hours, A1C: 5.4 on 04/13/21, Iron: 21

## 2021-05-05 NOTE — Discharge Summary (Signed)
Physician Discharge Summary  Jesse Lane UTM:[id number] DOB: 1956/01/14 DOA: 05/03/2021  PCP: [name], MD  Admit date: 05/03/2021 Discharge date: 05/05/2021  Admitted From: Home Disposition: Home  Recommendations for Outpatient Follow-up:  Follow up with PCP in 1 week with repeat CBC/BMP Outpatient follow-up with cardiology Follow up in ED if symptoms worsen or new appear   Home Health: No Equipment/Devices: None  Discharge Condition: Stable CODE STATUS: Full Diet recommendation: Heart healthy/carb modified/fluid restriction of up to 1500 cc a day  Brief/Interim Summary: 66 year old male with history of chronic diastolic heart failure, diabetes mellitus type 2, essential hypertension, chronic renal disease stage IIIa presented with worsening shortness of breath.  On presentation, he was hypoxic on room air with saturations of 84% requiring supplemental oxygen.  BNP was 490.  COVID-19/influenza testing were negative.  Chest x-ray showed possible pulmonary vascular congestion.  He was treated with IV Lasix.  Cardiology was consulted. During the hospitalization, he has diuresed well.  He feels much better.  Cardiology has cleared the patient for discharge with outpatient follow-up with cardiology.  He will be discharged home today.  Discharge Diagnoses:   Acute on chronic diastolic heart failure -Echo on 04/13/2021 showed EF of 60 to 65% with indeterminate diastolic parameters -Presented with worsening shortness of breath and chest x-ray showed possible pulmonary vascular congestion -Currently on IV Lasix 40 mg twice a day.  Continue diet and fluid restriction.  Negative balance of 6210 cc since admission. -Currently on lisinopril, nebivolol and Farxiga. -Cardiology has cleared the patient for discharge on Lasix 80 mg daily along with Farxiga 10 mg daily, lisinopril 40 mg daily, nebivolol 5 mg daily.  Outpatient follow-up with cardiology.    Hypoxia -Required 2 L oxygen via  nasal cannula on presentation.  Resolved.  Currently on room air.  Hypertension -Blood pressure currently on the higher side.  Continue Lasix, lisinopril, amlodipine and nebivolol  Hyperlipidemia -Continue statin  Diabetes mellitus type 2 with hyperglycemia -Carb modified diet.  Continue home regimen.  Hypokalemia -Resolved.  Continue replacement on discharge  CKD stage IIIa -Creatinine currently stable.  Outpatient follow-up   Anemia of chronic disease -Possibly from kidney disease.  Hemoglobin stable    Discharge Instructions  Discharge Instructions     Ambulatory referral to Cardiology   Complete by: As directed    Diet - low sodium heart healthy   Complete by: As directed    Diet Carb Modified   Complete by: As directed    Increase activity slowly   Complete by: As directed       Allergies as of 05/05/2021       Reactions   Contrast Media [iodinated Contrast Media] Itching        Medication List     TAKE these medications    acetaminophen 500 MG tablet Commonly known as: TYLENOL Take 1,000 mg by mouth every 6 (six) hours as needed for mild pain.   alendronate 70 MG tablet Commonly known as: FOSAMAX Take 70 mg by mouth once a week. Wednesday's   amLODipine 5 MG tablet Commonly known as: NORVASC Take 1 tablet (5 mg total) by mouth daily. Start taking on: May 06, 2021 What changed:  medication strength how much to take   dapagliflozin propanediol 10 MG Tabs tablet Commonly known as: FARXIGA Take 1 tablet (10 mg total) by mouth daily. Start taking on: May 06, 2021   furosemide 80 MG tablet Commonly known as: Lasix Take 1 tablet (80 mg total)  by mouth daily. What changed:  medication strength how much to take how to take this when to take this additional instructions   insulin lispro 100 UNIT/ML KwikPen Commonly known as: HUMALOG Inject 7-15 Units into the skin See admin instructions. 7 units in the morning, 7 units at lunch, and  8 units in the evening. May take additional units as needed, max of 50 units per day   Insulin Pen Needle 32G X 6 MM Misc Commonly known as: BD Pen Needle Micro U/F 1 each by Other route See admin instructions. Use one pen needle to inject insulin 4 times daily. **must have appointment for additional refills.** What changed: Another medication with the same name was changed. Make sure you understand how and when to take each.   Insulin Pen Needle 32G X 6 MM Misc Commonly known as: BD Pen Needle Micro U/F USE 1 PEN NEEDLE SIX TIMES DAILY What changed:  how much to take how to take this when to take this   lisinopril 40 MG tablet Commonly known as: ZESTRIL Take 40 mg by mouth at bedtime.   Lumify 0.025 % Soln Generic drug: Brimonidine Tartrate Place 1 drop into both eyes 2 (two) times daily as needed (irritated/dry eyes).   MELATONIN PO Take 1 tablet by mouth at bedtime as needed (sleep).   montelukast 10 MG tablet Commonly known as: SINGULAIR Take 10 mg by mouth at bedtime.   multivitamin with minerals Tabs tablet Take 1 tablet by mouth daily.   nebivolol 5 MG tablet Commonly known as: BYSTOLIC Take 1 tablet (5 mg total) by mouth at bedtime. What changed:  medication strength how much to take   ondansetron 4 MG tablet Commonly known as: ZOFRAN Take 1 tablet (4 mg total) by mouth every 6 (six) hours as needed for nausea.   pantoprazole 40 MG tablet Commonly known as: PROTONIX Take 1 tablet (40 mg total) by mouth daily as needed (heartburn).   potassium chloride SA 20 MEQ tablet Commonly known as: KLOR-CON M 20 mEq daily What changed:  how much to take how to take this when to take this additional instructions   pravastatin 20 MG tablet Commonly known as: PRAVACHOL Take 20 mg by mouth daily.   pregabalin 100 MG capsule Commonly known as: LYRICA Take 200-300 mg by mouth See admin instructions. Take 2 capsule (200 mg) by mouth every morning and take three  capsules (300 mg) at bedtime   traZODone 100 MG tablet Commonly known as: DESYREL Take 100 mg by mouth at bedtime.   Tyler Aas FlexTouch 100 UNIT/ML FlexTouch Pen Generic drug: insulin degludec Inject 25 Units into the skin daily before breakfast.        Follow-up Information     [facility] Follow up on 05/12/2021.   Specialty: Cardiology Why: Appointment at [facility]. Please arrive at least 15 minute prior to appointment. Parking code is 1202. Contact information: [address] [id number] Danice Goltz [place]        [name], MD. Schedule an appointment as soon as possible for a visit in 1 week(s).   Specialty: Family Medicine Contact information: [place] [address] [phone]                Allergies  Allergen Reactions   Contrast Media [Iodinated Contrast Media] Itching    Consultations: Cardiology   Procedures/Studies: DG Chest 2 View  Result Date: 05/03/2021 CLINICAL  DATA:  Chest pain EXAM: CHEST - 2 VIEW COMPARISON:  Chest radiograph dated 04/14/2021. FINDINGS: Small bilateral pleural effusions with bibasilar atelectasis or infiltrate. No pneumothorax. Mild cardiomegaly with mild vascular congestion. No acute osseous pathology. IMPRESSION: 1. Small bilateral pleural effusions with bibasilar atelectasis or infiltrate. 2. Mild cardiomegaly with mild vascular congestion. Electronically Signed   By: Anner Crete M.D.   On: 05/03/2021 21:21   DG Chest 2 View  Result Date: 04/14/2021 CLINICAL DATA:  CHF EXAM: CHEST - 2 VIEW COMPARISON:  04/12/2021 FINDINGS: Frontal and lateral views of the chest demonstrate an enlarged cardiac silhouette. Continue central vascular congestion, with bibasilar consolidation and small effusions unchanged. No pneumothorax. No acute bony abnormalities. IMPRESSION: 1. Stable congestive heart failure. Electronically Signed    By: Randa Ngo M.D.   On: 04/14/2021 14:59   CT Chest Wo Contrast  Result Date: 04/12/2021 CLINICAL DATA:  Dyspnea.  Evaluate for pneumonia. EXAM: CT CHEST WITHOUT CONTRAST TECHNIQUE: Multidetector CT imaging of the chest was performed following the standard protocol without IV contrast. COMPARISON:  10/06/2020 FINDINGS: Cardiovascular: Heart size upper normal to mildly enlarged. No substantial pericardial effusion. Coronary artery calcification is evident. Mild atherosclerotic calcification is noted in the wall of the thoracic aorta. Enlargement of the pulmonary outflow tract and main pulmonary arteries suggests pulmonary arterial hypertension. Mediastinum/Nodes: Upper normal lymph nodes are seen scattered in the mediastinum. No evidence for gross hilar lymphadenopathy although assessment is limited by the lack of intravenous contrast on the current study. The esophagus has normal imaging features. Similar upper normal right axillary lymph nodes measuring up to 8-9 mm short axis. Lungs/Pleura: Bilateral lower lobe collapse/consolidation evident with small to moderate bilateral pleural effusions. No suspicious pulmonary nodule or mass within the aerated lung. Upper Abdomen: Unremarkable. Musculoskeletal: No worrisome lytic or sclerotic osseous abnormality. IMPRESSION: 1. Bilateral lower lobe collapse/consolidation with small to moderate bilateral pleural effusions. 2. Enlargement of the pulmonary outflow tract and main pulmonary arteries suggests pulmonary arterial hypertension. 3. Aortic Atherosclerosis (ICD10-I70.0). Electronically Signed   By: Misty Stanley M.D.   On: 04/12/2021 09:21   US RENAL  Result Date: 04/13/2021 CLINICAL DATA:  Acute renal insufficiency EXAM: RENAL / URINARY TRACT ULTRASOUND COMPLETE COMPARISON:  CT 10/06/2020 FINDINGS: Right Kidney: Renal measurements: 11.5 x 5.8 x 5.7 cm = volume: 198 mL. Echogenicity within normal limits. No mass or hydronephrosis visualized. Left Kidney:  Renal measurements: 11.8 x 6.3 x 5.1 cm = volume: 198 mL. Echogenicity within normal limits. No mass or hydronephrosis visualized. Bladder: Appears normal for degree of bladder distention. Bilateral ureteral jets identified. Other: None. IMPRESSION: Normal renal sonogram Electronically Signed   By: Fidela Salisbury M.D.   On: 04/13/2021 03:49   DG Chest Portable 1 View  Result Date: 04/12/2021 CLINICAL DATA:  Hypertension, shortness of breath, former smoker EXAM: PORTABLE CHEST 1 VIEW COMPARISON:  03/10/2021 FINDINGS: Heart is enlarged with mid and lower lung asymmetric airspace opacities, worse on the right favored to represent edema over pneumonia. There is associated bibasilar atelectasis. No large effusion or pneumothorax. Trachea midline. Degenerative changes of the spine. IMPRESSION: Cardiomegaly with mid and lower lung asymmetric edema pattern versus pneumonia. Bibasilar atelectasis. Electronically Signed   By: Jerilynn Mages.  Shick M.D.   On: 04/12/2021 07:48   ECHOCARDIOGRAM COMPLETE  Result Date: 04/13/2021    ECHOCARDIOGRAM REPORT   Patient Name:   Jesse Lane Date of Exam: 04/13/2021 Medical Rec #:  [id number]        Height:  71.0 in Accession #:    [id number]       Weight:       200.0 lb Date of Birth:  Apr 24, 1955        BSA:          2.109 m Patient Age:    8 years         BP:           163/84 mmHg Patient Gender: M                HR:           65 bpm. Exam Location:  Inpatient Procedure: 2D Echo, Cardiac Doppler and Color Doppler Indications:    CHF  History:        Patient has prior history of Echocardiogram examinations, most                 recent 03/09/2021. Stroke; Risk Factors:Diabetes and                 Hypertension.  Sonographer:    Glo Herring Referring Phys: [id number] [place]  1. Left ventricular ejection fraction, by estimation, is 60 to 65%. The left ventricle has normal function. The left ventricle has no regional wall motion abnormalities. There is mild left  ventricular hypertrophy. Left ventricular diastolic parameters are indeterminate.  2. Right ventricular systolic function is normal. The right ventricular size is normal.  3. The mitral valve is normal in structure. Trivial mitral valve regurgitation.  4. The aortic valve is normal in structure. Aortic valve regurgitation is not visualized.  5. The inferior vena cava is dilated in size with <50% respiratory variability, suggesting right atrial pressure of 15 mmHg. Comparison(s): The left ventricular function is unchanged. FINDINGS  Left Ventricle: Left ventricular ejection fraction, by estimation, is 60 to 65%. The left ventricle has normal function. The left ventricle has no regional wall motion abnormalities. The left ventricular internal cavity size was normal in size. There is  mild left ventricular hypertrophy. Left ventricular diastolic parameters are indeterminate. Right Ventricle: The right ventricular size is normal. Right vetricular wall thickness was not assessed. Right ventricular systolic function is normal. Left Atrium: Left atrial size was normal in size. Right Atrium: Right atrial size was normal in size. Pericardium: There is no evidence of pericardial effusion. Mitral Valve: The mitral valve is normal in structure. Trivial mitral valve regurgitation. Tricuspid Valve: The tricuspid valve is normal in structure. Tricuspid valve regurgitation is trivial. Aortic Valve: The aortic valve is normal in structure. Aortic valve regurgitation is not visualized. Aortic valve mean gradient measures 4.0 mmHg. Aortic valve peak gradient measures 6.9 mmHg. Aortic valve area, by VTI measures 1.79 cm. Pulmonic Valve: The pulmonic valve was not well visualized. Pulmonic valve regurgitation is trivial. Aorta: The aortic root and ascending aorta are structurally normal, with no evidence of dilitation. Venous: The inferior vena cava is dilated in size with less than 50% respiratory variability, suggesting right atrial  pressure of 15 mmHg. IAS/Shunts: No atrial level shunt detected by color flow Doppler.  LEFT VENTRICLE PLAX 2D LVIDd:         5.00 cm   Diastology LVIDs:         3.20 cm   LV e' medial:    6.53 cm/s LV PW:         1.30 cm   LV E/e' medial:  12.8 LV IVS:        1.30 cm  LV e' lateral:   6.96 cm/s LVOT diam:     2.00 cm   LV E/e' lateral: 12.0 LV SV:         61 LV SV Index:   29 LVOT Area:     3.14 cm  RIGHT VENTRICLE             IVC RV Basal diam:  5.30 cm     IVC diam: 2.60 cm RV Mid diam:    3.90 cm RV S prime:     12.50 cm/s LEFT ATRIUM             Index        RIGHT ATRIUM           Index LA diam:        4.70 cm 2.23 cm/m   RA Area:     23.90 cm LA Vol (A2C):   74.2 ml 35.19 ml/m  RA Volume:   69.80 ml  33.10 ml/m LA Vol (A4C):   60.5 ml 28.69 ml/m LA Biplane Vol: 67.9 ml 32.20 ml/m  AORTIC VALVE AV Area (Vmax):    1.91 cm AV Area (Vmean):   1.83 cm AV Area (VTI):     1.79 cm AV Vmax:           131.00 cm/s AV Vmean:          90.400 cm/s AV VTI:            0.343 m AV Peak Grad:      6.9 mmHg AV Mean Grad:      4.0 mmHg LVOT Vmax:         79.60 cm/s LVOT Vmean:        52.800 cm/s LVOT VTI:          0.195 m LVOT/AV VTI ratio: 0.57  AORTA Ao Root diam: 3.40 cm Ao Asc diam:  3.50 cm MITRAL VALVE MV Area (PHT): 4.15 cm    SHUNTS MV Decel Time: 183 msec    Systemic VTI:  0.20 m MV E velocity: 83.60 cm/s  Systemic Diam: 2.00 cm MV A velocity: 42.40 cm/s MV E/A ratio:  1.97 Dorris Carnes MD Electronically signed by Dorris Carnes MD Signature Date/Time: 04/13/2021/3:59:00 PM    Final       Subjective: Patient seen and examined at bedside.  No fever, vomiting, abdominal pain reported.  Feels much better and wants to go home today.  Discharge Exam: Vitals:   05/05/21 0609 05/05/21 0739  BP:  (!) 145/84  Pulse:  (!) 56  Resp:  16  Temp:  97.6 F (36.4 C)  SpO2: 95% 95%    General: Pt is alert, awake, not in acute distress.  Currently on room air. Cardiovascular: rate controlled, S1/S2 + Respiratory:  bilateral decreased breath sounds at bases Abdominal: Soft, NT, ND, bowel sounds + Extremities: Trace lower extremity edema; no cyanosis    The results of significant diagnostics from this hospitalization (including imaging, microbiology, ancillary and laboratory) are listed below for reference.     Microbiology: Recent Results (from the past 240 hour(s))  Resp Panel by RT-PCR (Flu A&B, Covid) Nasopharyngeal Swab     Status: None   Collection Time: 05/03/21 11:10 PM   Specimen: Nasopharyngeal Swab; Nasopharyngeal(NP) swabs in vial transport medium  Result Value Ref Range Status   SARS Coronavirus 2 by RT PCR NEGATIVE NEGATIVE Final    Comment: (NOTE) SARS-CoV-2 target nucleic acids are NOT DETECTED.  The SARS-CoV-2 RNA is  generally detectable in upper respiratory specimens during the acute phase of infection. The lowest concentration of SARS-CoV-2 viral copies this assay can detect is 138 copies/mL. A negative result does not preclude SARS-Cov-2 infection and should not be used as the sole basis for treatment or other patient management decisions. A negative result may occur with  improper specimen collection/handling, submission of specimen other than nasopharyngeal swab, presence of viral mutation(s) within the areas targeted by this assay, and inadequate number of viral copies(<138 copies/mL). A negative result must be combined with clinical observations, patient history, and epidemiological information. The expected result is Negative.  Fact Sheet for Patients:  EntrepreneurPulse.com.au  Fact Sheet for Healthcare Providers:  IncredibleEmployment.be  This test is no t yet approved or cleared by the [place] FDA and  has been authorized for detection and/or diagnosis of SARS-CoV-2 by FDA under an Emergency Use Authorization (EUA). This EUA will remain  in effect (meaning this test can be used) for the duration of the COVID-19  declaration under Section 564(b)(1) of the Act, 21 U.S.C.section 360bbb-3(b)(1), unless the authorization is terminated  or revoked sooner.       Influenza A by PCR NEGATIVE NEGATIVE Final   Influenza B by PCR NEGATIVE NEGATIVE Final    Comment: (NOTE) The Xpert Xpress SARS-CoV-2/FLU/RSV plus assay is intended as an aid in the diagnosis of influenza from Nasopharyngeal swab specimens and should not be used as a sole basis for treatment. Nasal washings and aspirates are unacceptable for Xpert Xpress SARS-CoV-2/FLU/RSV testing.  Fact Sheet for Patients: EntrepreneurPulse.com.au  Fact Sheet for Healthcare Providers: IncredibleEmployment.be  This test is not yet approved or cleared by the [place] FDA and has been authorized for detection and/or diagnosis of SARS-CoV-2 by FDA under an Emergency Use Authorization (EUA). This EUA will remain in effect (meaning this test can be used) for the duration of the COVID-19 declaration under Section 564(b)(1) of the Act, 21 U.S.C. section 360bbb-3(b)(1), unless the authorization is terminated or revoked.  Performed at [facility], [place] [address] [place], [place] [address]      Labs: BNP (last 3 results) Recent Labs    03/08/21 2033 04/12/21 0701 05/03/21 2000  BNP 683.9* 741.4* 195.0*   Basic Metabolic Panel: Recent Labs  Lab 05/03/21 2000 05/03/21 2013 05/04/21 0415 05/05/21 0323  NA 138 139 141 137  K 3.4* 3.4* 3.2* 4.0  CL 99  --  103 101  CO2 27  --  30 27  GLUCOSE 313*  --  187* 266*  BUN 22  --  19 16  CREATININE 1.58*  --  1.48* 1.57*  CALCIUM 8.8*  --  8.6* 8.8*  MG  --   --  1.7 1.7   Liver Function Tests: Recent Labs  Lab 05/03/21 2000 05/04/21 0415  AST 14* 12*  ALT 11 11  ALKPHOS 61 56  BILITOT 1.7* 1.7*  PROT 6.1* 6.2*  ALBUMIN 3.6 3.3*   No results for input(s): LIPASE, AMYLASE in the last 168 hours. No results for input(s): AMMONIA in the last 168  hours. CBC: Recent Labs  Lab 05/03/21 2000 05/03/21 2013 05/04/21 0415  WBC 4.6  --  3.7*  NEUTROABS  --   --  2.2  HGB 11.7* 11.6* 11.3*  HCT 34.3* 34.0* 32.4*  MCV 86.0  --  85.7  PLT 192  --  170   Cardiac Enzymes: No results for input(s): CKTOTAL, CKMB, CKMBINDEX, TROPONINI in the last 168 hours. BNP: Invalid  input(s): POCBNP CBG: Recent Labs  Lab 05/04/21 0926 05/04/21 1332 05/04/21 1620 05/04/21 2117 05/05/21 0542  GLUCAP 228* 314* 351* 273* 330*   D-Dimer No results for input(s): DDIMER in the last 72 hours. Hgb A1c No results for input(s): HGBA1C in the last 72 hours. Lipid Profile No results for input(s): CHOL, HDL, LDLCALC, TRIG, CHOLHDL, LDLDIRECT in the last 72 hours. Thyroid function studies No results for input(s): TSH, T4TOTAL, T3FREE, THYROIDAB in the last 72 hours.  Invalid input(s): FREET3 Anemia work up No results for input(s): VITAMINB12, FOLATE, FERRITIN, TIBC, IRON, RETICCTPCT in the last 72 hours. Urinalysis    Component Value Date/Time   COLORURINE YELLOW 04/13/2021 1018   APPEARANCEUR CLEAR 04/13/2021 1018   LABSPEC 1.009 04/13/2021 1018   PHURINE 5.0 04/13/2021 1018   GLUCOSEU NEGATIVE 04/13/2021 1018   HGBUR SMALL (A) 04/13/2021 1018   BILIRUBINUR NEGATIVE 04/13/2021 1018   KETONESUR NEGATIVE 04/13/2021 1018   PROTEINUR 30 (A) 04/13/2021 1018   UROBILINOGEN 0.2 12/05/2011 1655   NITRITE NEGATIVE 04/13/2021 1018   LEUKOCYTESUR NEGATIVE 04/13/2021 1018   Sepsis Labs Invalid input(s): PROCALCITONIN,  WBC,  LACTICIDVEN Microbiology Recent Results (from the past 240 hour(s))  Resp Panel by RT-PCR (Flu A&B, Covid) Nasopharyngeal Swab     Status: None   Collection Time: 05/03/21 11:10 PM   Specimen: Nasopharyngeal Swab; Nasopharyngeal(NP) swabs in vial transport medium  Result Value Ref Range Status   SARS Coronavirus 2 by RT PCR NEGATIVE NEGATIVE Final    Comment: (NOTE) SARS-CoV-2 target nucleic acids are NOT DETECTED.  The  SARS-CoV-2 RNA is generally detectable in upper respiratory specimens during the acute phase of infection. The lowest concentration of SARS-CoV-2 viral copies this assay can detect is 138 copies/mL. A negative result does not preclude SARS-Cov-2 infection and should not be used as the sole basis for treatment or other patient management decisions. A negative result may occur with  improper specimen collection/handling, submission of specimen other than nasopharyngeal swab, presence of viral mutation(s) within the areas targeted by this assay, and inadequate number of viral copies(<138 copies/mL). A negative result must be combined with clinical observations, patient history, and epidemiological information. The expected result is Negative.  Fact Sheet for Patients:  EntrepreneurPulse.com.au  Fact Sheet for Healthcare Providers:  IncredibleEmployment.be  This test is no t yet approved or cleared by the [place] FDA and  has been authorized for detection and/or diagnosis of SARS-CoV-2 by FDA under an Emergency Use Authorization (EUA). This EUA will remain  in effect (meaning this test can be used) for the duration of the COVID-19 declaration under Section 564(b)(1) of the Act, 21 U.S.C.section 360bbb-3(b)(1), unless the authorization is terminated  or revoked sooner.       Influenza A by PCR NEGATIVE NEGATIVE Final   Influenza B by PCR NEGATIVE NEGATIVE Final    Comment: (NOTE) The Xpert Xpress SARS-CoV-2/FLU/RSV plus assay is intended as an aid in the diagnosis of influenza from Nasopharyngeal swab specimens and should not be used as a sole basis for treatment. Nasal washings and aspirates are unacceptable for Xpert Xpress SARS-CoV-2/FLU/RSV testing.  Fact Sheet for Patients: EntrepreneurPulse.com.au  Fact Sheet for Healthcare Providers: IncredibleEmployment.be  This test is not yet approved or  cleared by the [place] FDA and has been authorized for detection and/or diagnosis of SARS-CoV-2 by FDA under an Emergency Use Authorization (EUA). This EUA will remain in effect (meaning this test can be used) for the duration of the COVID-19 declaration under Section  564(b)(1) of the Act, 21 U.S.C. section 360bbb-3(b)(1), unless the authorization is terminated or revoked.  Performed at [facility], [place] [address], [place], [place] [address]      Time coordinating discharge: 35 minutes  SIGNED:   Aline August, MD  [facility] 05/05/2021, 10:11 AM

## 2021-05-05 NOTE — TOC Initial Note (Signed)
Transition of Care [facility]) - Initial/Assessment Note    Patient Details  Name: Jesse Lane MRN: [id number] Date of Birth: 1955-07-19  Transition of Care [facility]) CM/SW Contact:    Zenon Mayo, RN Phone Number: 05/05/2021, 10:48 AM  Clinical Narrative:                 Patient is for dc today home with girlfriend, weighs himself daily, trys to eat a low sodium or no salt diet. Indep. Retired. Will need transport home . Girlfriend is at work. He is not homebound ,does not qualify for [facility] services.   Expected Discharge Plan: Home/Self Care Barriers to Discharge: No Barriers Identified   Patient Goals and CMS Choice Patient states their goals for this hospitalization and ongoing recovery are:: return home   Choice offered to / list presented to : Patient  Expected Discharge Plan and Services Expected Discharge Plan: Home/Self Care   Discharge Planning Services: CM Consult Post Acute Care Choice: NA Living arrangements for the past 2 months: Single Family Home Expected Discharge Date: 05/05/21                 DME Agency: NA       HH Arranged: NA          Prior Living Arrangements/Services Living arrangements for the past 2 months: Single Family Home Lives with:: Friends, Significant Other Patient language and need for interpreter reviewed:: Yes Do you feel safe going back to the place where you live?: Yes      Need for Family Participation in Patient Care: Yes (Comment) Care giver support system in place?: Yes (comment)   Criminal Activity/Legal Involvement Pertinent to Current Situation/Hospitalization: No - Comment as needed  Activities of Daily Living      Permission Sought/Granted                  Emotional Assessment Appearance:: Appears stated age Attitude/Demeanor/Rapport: Engaged Affect (typically observed): Appropriate Orientation: : Oriented to Self, Oriented to Place, Oriented to  Time, Oriented to Situation Alcohol / Substance Use:  Not Applicable Psych Involvement: No (comment)  Admission diagnosis:  Acute on chronic diastolic heart failure ([place]) [I50.33] Hypoxia [R09.02] Acute on chronic congestive heart failure, unspecified heart failure type ([place]) [I50.9] Patient Active Problem List   Diagnosis Date Noted   Elevated troponin 05/04/2021   Stage 3a chronic kidney disease (CKD) ([place])    Acute on chronic diastolic heart failure ([place]) 05/03/2021   Acute on chronic heart failure ([place]) 04/12/2021   GERD (gastroesophageal reflux disease) 03/09/2021   Hypokalemia 01/24/2021   CKD (chronic kidney disease), stage II 08/27/2020   Hypertensive urgency 08/27/2020   Gastroparesis due to secondary diabetes ([place]) 08/27/2020   Stroke ([place]) 10/16/2019   Multinodular goiter 12/27/2016   Insulin-requiring or dependent type II diabetes mellitus ([place]) 11/26/2016   Protein-calorie malnutrition, severe 09/17/2016   Fecal impaction in rectum ([place])    Hyperglycemia 09/16/2016   Hyperglycemic hyperosmolar nonketotic coma ([place]) 09/16/2016   High anion gap metabolic acidosis 34/19/6222   Lactic acidosis 09/16/2016   Anemia of chronic disease 09/16/2016   Diabetic neuropathy ([place]) 09/06/2016   Essential hypertension 09/06/2016   Malignant melanoma of axilla ([place]) 08/12/2016   PCP:  [name], MD Pharmacy:   [place], [place] [address] Phone: [phone] Fax: [phone]     Social Determinants of Health (SDOH) Interventions Food Insecurity Interventions: Intervention  Not Indicated Financial Strain Interventions: Intervention Not Indicated Housing Interventions: Intervention Not Indicated Transportation Interventions: [facility]  Readmission Risk Interventions Readmission Risk Prevention Plan 05/05/2021 03/10/2021  Transportation Screening Complete Complete  HRI or [place] - Complete  Social Work  Consult for [place] Planning/Counseling - Complete  Palliative Care Screening - Complete  Medication Review Press photographer) Complete Complete  PCP or Specialist appointment within 3-5 days of discharge Complete -  [place] or Home Care Consult Complete -  SW Recovery Care/Counseling Consult Complete -  Palliative Care Screening Not Applicable -  [place] Not Applicable -  Some recent data might be hidden

## 2021-05-05 NOTE — Progress Notes (Addendum)
Progress Note  Patient Name: Jesse Lane Date of Encounter: 05/05/2021  [facility] Cardiologist: None New (Shemuel Harkleroad)  Subjective   Feels well, able to lie fully supine in bed and walk around room without dyspnea.  Has not walked in hall yet.  Denies angina.  No dizziness or palpitations.  Inpatient Medications    Scheduled Meds:  amLODipine  5 mg Oral Daily   dapagliflozin propanediol  10 mg Oral Daily   diphenhydrAMINE  50 mg Oral Once   Or   diphenhydrAMINE  50 mg Intravenous Once   enoxaparin (LOVENOX) injection  40 mg Subcutaneous Q24H   furosemide  40 mg Intravenous BID   insulin aspart  0-9 Units Subcutaneous TID WC & HS   insulin glargine-yfgn  10 Units Subcutaneous Q breakfast   lisinopril  40 mg Oral QHS   montelukast  10 mg Oral QHS   nebivolol  5 mg Oral QHS   potassium chloride  20 mEq Oral Daily   pravastatin  20 mg Oral Daily   predniSONE  50 mg Oral Q6H   Continuous Infusions:  PRN Meds: acetaminophen **OR** acetaminophen, traZODone   Vital Signs    Vitals:   05/04/21 2342 05/05/21 0444 05/05/21 0609 05/05/21 0739  BP: (!) 159/68 (!) 152/95  (!) 145/84  Pulse: 64 60  (!) 56  Resp: 16 17  16   Temp: 98.7 F (37.1 C) 97.7 F (36.5 C)  97.6 F (36.4 C)  TempSrc: Oral Oral  Oral  SpO2: 96% 95% 95% 95%  Weight: 85.8 kg     Height:        Intake/Output Summary (Last 24 hours) at 05/05/2021 0940 Last data filed at 05/05/2021 0749 Gross per 24 hour  Intake 360 ml  Output 3850 ml  Net -3490 ml   Last 3 Weights 05/04/2021 05/04/2021 05/03/2021  Weight (lbs) 189 lb 3.2 oz 197 lb 5 oz 195 lb  Weight (kg) 85.821 kg 89.5 kg 88.451 kg      Telemetry    Mild sinus bradycardia/normal sinus rhythm, frequency of PACs is markedly reduced- Personally Reviewed  ECG    No new tracing- Personally Reviewed  Physical Exam  Looks very comfortable lying fully supine in bed GEN: No acute distress.   Neck: No JVD Cardiac: RRR, no murmurs, rubs, or  gallops.  Respiratory: Clear to auscultation bilaterally. GI: Soft, nontender, non-distended  MS: No edema; No deformity. Neuro:  Nonfocal  Psych: Normal affect   Labs    High Sensitivity Troponin:   Recent Labs  Lab 05/03/21 2000 05/03/21 2213 05/04/21 0415  TROPONINIHS 21* 25* 31*     Chemistry Recent Labs  Lab 05/03/21 2000 05/03/21 2013 05/04/21 0415 05/05/21 0323  NA 138 139 141 137  K 3.4* 3.4* 3.2* 4.0  CL 99  --  103 101  CO2 27  --  30 27  GLUCOSE 313*  --  187* 266*  BUN 22  --  19 16  CREATININE 1.58*  --  1.48* 1.57*  CALCIUM 8.8*  --  8.6* 8.8*  MG  --   --  1.7 1.7  PROT 6.1*  --  6.2*  --   ALBUMIN 3.6  --  3.3*  --   AST 14*  --  12*  --   ALT 11  --  11  --   ALKPHOS 61  --  56  --   BILITOT 1.7*  --  1.7*  --   GFRNONAA 48*  --  52* 48*  ANIONGAP 12  --  8 9    Lipids No results for input(s): CHOL, TRIG, HDL, LABVLDL, LDLCALC, CHOLHDL in the last 168 hours.  Hematology Recent Labs  Lab 05/03/21 2000 05/03/21 2013 05/04/21 0415  WBC 4.6  --  3.7*  RBC 3.99*  --  3.78*  HGB 11.7* 11.6* 11.3*  HCT 34.3* 34.0* 32.4*  MCV 86.0  --  85.7  MCH 29.3  --  29.9  MCHC 34.1  --  34.9  RDW 13.5  --  13.6  PLT 192  --  170   Thyroid No results for input(s): TSH, FREET4 in the last 168 hours.  BNP Recent Labs  Lab 05/03/21 2000  BNP 491.2*    DDimer No results for input(s): DDIMER in the last 168 hours.   Radiology    DG Chest 2 View  Result Date: 05/03/2021 CLINICAL DATA:  Chest pain EXAM: CHEST - 2 VIEW COMPARISON:  Chest radiograph dated 04/14/2021. FINDINGS: Small bilateral pleural effusions with bibasilar atelectasis or infiltrate. No pneumothorax. Mild cardiomegaly with mild vascular congestion. No acute osseous pathology. IMPRESSION: 1. Small bilateral pleural effusions with bibasilar atelectasis or infiltrate. 2. Mild cardiomegaly with mild vascular congestion. Electronically Signed   By: Anner Crete M.D.   On: 05/03/2021 21:21     Cardiac Studies   Echo 04/13/2021  1. Left ventricular ejection fraction, by estimation, is 60 to 65%. The  left ventricle has normal function. The left ventricle has no regional  wall motion abnormalities. There is mild left ventricular hypertrophy.  Left ventricular diastolic parameters  are indeterminate.   2. Right ventricular systolic function is normal. The right ventricular  size is normal.   3. The mitral valve is normal in structure. Trivial mitral valve  regurgitation.   4. The aortic valve is normal in structure. Aortic valve regurgitation is  not visualized.   5. The inferior vena cava is dilated in size with <50% respiratory  variability, suggesting right atrial pressure of 15 mmHg.   Comparison(s): The left ventricular function is unchanged.     Patient Profile     66 y.o. male with recent onset chronic heart failure with preserved ejection fraction, type 2 diabetes mellitus, stage IIIa chronic kidney disease, admitted for rapid recurrence of heart failure exacerbation, second time this month.  Assessment & Plan    CHF: Appears to be well compensated/euvolemic today.  Diuresis approximately 6 L although input is insufficiently recorded.  Weight down to 189 pounds today, roughly 8 pounds less than admission.  Estimate his "dry weight" to be less than 190 pounds.  Reviewed the importance of dietary sodium restriction, daily weight monitoring, signs and symptoms of heart failure exacerbation.  He should call our office if he gains 3 pounds overnight or 5 pounds in a week or if his weight ever exceeds 190 pounds on his home scale.  Started SGLT2 inhibitor.  On combined alpha beta-blocker and full dose ACE inhibitor.  Will discharge on furosemide 80 mg once daily, but it is quite likely the dose can be reduced in the future as the [place] effect "kicks in".  We will need early follow-up to reassess renal function and clinical response. CKD3a: Renal function has been essentially  unchanged throughout the hospitalization despite aggressive diuresis.  Potassium level normal on current supplement. HTN: BP not quite at target less than 130/80.  Amlodipine just restarted.  The dose may need to be titrated upwards. Sinus bradycardia-first-degree AV block: Nebivolol dose  was decreased. Coronary artery calcification on CT: Cannot exclude contribution of CAD to diastolic heart failure, with a severity that exceeds the relatively mild degree of LVH.  May have a contribution from coronary insufficiency, but does not have angina pectoris or ECG changes at this time.  Outpatient coronary CT angiogram to be scheduled.  Increase pravastatin 40 mg at bedtime daily.  Target LDL preferably less than 70.  [facility] will sign off.   Medication Recommendations:   Furosemide 80 mg once daily Potassium chloride 20 mEq once daily Farxiga 10 mg once daily Lisinopril 40 mg once daily Nebivolol 5 mg once daily Amlodipine 5 mg once daily Pravastatin 40 mg once daily in the evening  Other recommendations (labs, testing, etc): Needs repeat bmet next week, prior to Coronary CTAngio Follow up as an outpatient: We will try to make arrangements for early follow-up in the HF [facility] clinic.  For questions or updates, please contact [place] Please consult [email or web address] for contact info under        Signed, Sanda Klein, MD  05/05/2021, 9:40 AM

## 2021-05-05 NOTE — Progress Notes (Signed)
Ordered outpatient coronary CTA per Dr. Sallyanne Kuster. Patient has a contrast allergy so will need to be pre-medicated. I will go ahead and send in prescription of Prednisone and Benadryl. Baseline heart rates in the 50s to 60s so will not prescribed a beta-blocker. Please see Dr. Victorino December rounding note from today for more information.  Darreld Mclean, PA-C 05/05/2021 10:54 AM

## 2021-05-05 NOTE — TOC Transition Note (Signed)
Transition of Care [facility]) - CM/SW Discharge Note   Patient Details  Name: Jesse Lane MRN: [id number] Date of Birth: 1955/07/09  Transition of Care [facility]) CM/SW Contact:  Zenon Mayo, RN Phone Number: 05/05/2021, 10:51 AM   Clinical Narrative:    Patient is for dc today home with girlfriend, weighs himself daily, trys to eat a low sodium or no salt diet. Indep. Retired. Will need transport home . Girlfriend is at work. He is not homebound ,does not qualify for [facility] services.      Final next level of care: Home/Self Care Barriers to Discharge: No Barriers Identified   Patient Goals and CMS Choice Patient states their goals for this hospitalization and ongoing recovery are:: return home   Choice offered to / list presented to : NA  Discharge Placement                       Discharge Plan and Services   Discharge Planning Services: CM Consult Post Acute Care Choice: NA            DME Agency: NA       HH Arranged: NA          Social Determinants of Health (SDOH) Interventions Food Insecurity Interventions: Intervention Not Indicated Financial Strain Interventions: Intervention Not Indicated Housing Interventions: Intervention Not Indicated Transportation Interventions: [facility]   Readmission Risk Interventions Readmission Risk Prevention Plan 05/05/2021 03/10/2021  Transportation Screening Complete Complete  HRI or [place] - Complete  Social Work Consult for [place] Planning/Counseling - Complete  Palliative Care Screening - Complete  Medication Review Press photographer) Complete Complete  PCP or Specialist appointment within 3-5 days of discharge Complete -  [place] or Home Care Consult Complete -  SW Recovery Care/Counseling Consult Complete -  Palliative Care Screening Not Applicable -  [place] Not Applicable -  Some recent data might be hidden

## 2021-05-05 NOTE — Plan of Care (Signed)
Problem: Skin Integrity: Goal: Risk for impaired skin integrity will decrease Outcome: Completed/Met   Problem: Safety: Goal: Ability to remain free from injury will improve Outcome: Completed/Met   Problem: Pain Managment: Goal: General experience of comfort will improve Outcome: Completed/Met   Problem: Elimination: Goal: Will not experience complications related to urinary retention Outcome: Completed/Met   

## 2021-05-11 NOTE — Progress Notes (Incomplete)
HEART & VASCULAR TRANSITION OF CARE CONSULT NOTE     Referring Physician: Primary Care: Primary Cardiologist:  HPI: Referred to clinic by Dr. Sallyanne Kuster for heart failure consultation.   66 y/o male w/ chronic diastolic heart failure, HTN, HLD, T2DM, HLD, Stage IIIa CKD and Sinus bradycardia-first-degree AV block. Multiple readmits for a/c CHF in the last 2 months (12/22 and again 1/23)  Echo 7/21: EF 60-65%, GIDD, RV normal  Echo 12/22: EF 60-65%, GIIDD, Mod LVH, RV mildly enlarged w/ normal systolic fx  V/Q scan 01/74: No suspicious perfusion defects visualized.  Recently readmitted 1/23 w/ SOB and found to be in a/c CHF. Diuresed w/ IV Lasix. Repeat echo EF 60-65%, Mild LVH. RV normal. Chest CT showed no evidence for gross hilar lymphadenopathy but did show enlargement of the pulmonary outflow tract and main pulmonary arteries suggests pulmonary arterial hypertension. This was done w/o contrast. Thus unable to r/o PE. Coronary artery calcifications noted   He diuresed 8 lb. Estimated dry wt ~190 lb. Transitioned to PO lasix + addition of [place]. Required dose reduction of bisoprolol given bradycardia.   Referred to [facility] clinic for post hospital f/u. Also arranged to get outpatient coronary CTA.    Presents to [facility] clinic today for f/u.    Cardiac Testing    Echo 7/21: EF 60-65%, GIDD, RV normal  Echo 12/22: EF 60-65%, GIIDD, Mod LVH, RV normal Echo 1/23: EF 60-65%, Mild LVH, RV normal   Review of Systems: [y] = yes, [ ]  = no   General: Weight gain [ ] ; Weight loss [ ] ; Anorexia [ ] ; Fatigue [ ] ; Fever [ ] ; Chills [ ] ; Weakness [ ]   Cardiac: Chest pain/pressure [ ] ; Resting SOB [ ] ; Exertional SOB [ ] ; Orthopnea [ ] ; Pedal Edema [ ] ; Palpitations [ ] ; Syncope [ ] ; Presyncope [ ] ; Paroxysmal nocturnal dyspnea[ ]   Pulmonary: Cough [ ] ; Wheezing[ ] ; Hemoptysis[ ] ; Sputum [ ] ; Snoring [ ]   GI: Vomiting[ ] ; Dysphagia[ ] ; Melena[ ] ; Hematochezia [ ] ; Heartburn[ ] ; Abdominal pain  [ ] ; Constipation [ ] ; Diarrhea [ ] ; BRBPR [ ]   GU: Hematuria[ ] ; Dysuria [ ] ; Nocturia[ ]   Vascular: Pain in legs with walking [ ] ; Pain in feet with lying flat [ ] ; Non-healing sores [ ] ; Stroke [ ] ; TIA [ ] ; Slurred speech [ ] ;  Neuro: Headaches[ ] ; Vertigo[ ] ; Seizures[ ] ; Paresthesias[ ] ;Blurred vision [ ] ; Diplopia [ ] ; Vision changes [ ]   Ortho/Skin: Arthritis [ ] ; Joint pain [ ] ; Muscle pain [ ] ; Joint swelling [ ] ; Back Pain [ ] ; Rash [ ]   Psych: Depression[ ] ; Anxiety[ ]   Heme: Bleeding problems [ ] ; Clotting disorders [ ] ; Anemia [ ]   Endocrine: Diabetes [ ] ; Thyroid dysfunction[ ]    Past Medical History:  Diagnosis Date   Arthritis    Diabetes mellitus type 2, uncontrolled    Diabetic neuropathy (HCC)    GERD (gastroesophageal reflux disease)    History of stomach ulcers    Hypertension    Melanoma (HCC)    mets to lymph node and intestines    Current Outpatient Medications  Medication Sig Dispense Refill   acetaminophen (TYLENOL) 500 MG tablet Take 1,000 mg by mouth every 6 (six) hours as needed for mild pain.     alendronate (FOSAMAX) 70 MG tablet Take 70 mg by mouth once a week. Wednesday's     amLODipine (NORVASC) 5 MG tablet Take 1 tablet (5 mg total) by mouth  daily. 30 tablet 0   Brimonidine Tartrate (LUMIFY) 0.025 % SOLN Place 1 drop into both eyes 2 (two) times daily as needed (irritated/dry eyes).     dapagliflozin propanediol (FARXIGA) 10 MG TABS tablet Take 1 tablet (10 mg total) by mouth daily. 30 tablet 0   diphenhydrAMINE (BENADRYL) 50 MG capsule Take one capsule 1 hour prior to scan. 1 capsule 0   furosemide (LASIX) 80 MG tablet Take 1 tablet (80 mg total) by mouth daily. 30 tablet 0   insulin degludec (TRESIBA FLEXTOUCH) 100 UNIT/ML FlexTouch Pen Inject 25 Units into the skin daily before breakfast.     insulin lispro (HUMALOG) 100 UNIT/ML KwikPen Inject 7-15 Units into the skin See admin instructions. 7 units in the morning, 7 units at lunch, and 8 units  in the evening. May take additional units as needed, max of 50 units per day     Insulin Pen Needle (BD PEN NEEDLE MICRO U/F) 32G X 6 MM MISC 1 each by Other route See admin instructions. Use one pen needle to inject insulin 4 times daily. **must have appointment for additional refills.** 150 each 0   Insulin Pen Needle (BD PEN NEEDLE MICRO U/F) 32G X 6 MM MISC USE 1 PEN NEEDLE SIX TIMES DAILY (Patient taking differently: 1 each by Other route See admin instructions. USE 1 PEN NEEDLE SIX TIMES DAILY) 200 each 2   lisinopril (PRINIVIL,ZESTRIL) 40 MG tablet Take 40 mg by mouth at bedtime.   0   MELATONIN PO Take 1 tablet by mouth at bedtime as needed (sleep).     montelukast (SINGULAIR) 10 MG tablet Take 10 mg by mouth at bedtime.     Multiple Vitamin (MULTIVITAMIN WITH MINERALS) TABS tablet Take 1 tablet by mouth daily.     nebivolol (BYSTOLIC) 5 MG tablet Take 1 tablet (5 mg total) by mouth at bedtime. 30 tablet 0   ondansetron (ZOFRAN) 4 MG tablet Take 1 tablet (4 mg total) by mouth every 6 (six) hours as needed for nausea. 30 tablet 5   pantoprazole (PROTONIX) 40 MG tablet Take 1 tablet (40 mg total) by mouth daily as needed (heartburn).     potassium chloride SA (KLOR-CON M) 20 MEQ tablet 20 mEq daily (Patient taking differently: Take 20 mEq by mouth daily.) 36 tablet 0   pravastatin (PRAVACHOL) 20 MG tablet Take 20 mg by mouth daily.     predniSONE (DELTASONE) 50 MG tablet Take one tablet 13 hours, 7 hours, and 1 hour prior to scan. 3 tablet 0   pregabalin (LYRICA) 100 MG capsule Take 200-300 mg by mouth See admin instructions. Take 2 capsule (200 mg) by mouth every morning and take three capsules (300 mg) at bedtime     traZODone (DESYREL) 100 MG tablet Take 100 mg by mouth at bedtime.  0   No current facility-administered medications for this visit.    Allergies  Allergen Reactions   Contrast Media [Iodinated Contrast Media] Itching      Social History   Socioeconomic History    Marital status: Significant Other    Spouse name: Tula Nakayama- S/O   Number of children: 1   Years of education: Not on file   Highest education level: Some college, no degree  Occupational History   Occupation: retired  Tobacco Use   Smoking status: Former    Packs/day: 1.00    Years: 8.00    Pack years: 8.00    Types: Cigarettes    Quit date: 1998  Years since quitting: 25.1   Smokeless tobacco: Never  Vaping Use   Vaping Use: Never used  Substance and Sexual Activity   Alcohol use: Yes    Alcohol/week: 4.0 standard drinks    Types: 4 Glasses of wine per week    Comment: occasionally   Drug use: Not Currently    Frequency: 7.0 times per week    Types: Marijuana    Comment: daily   Sexual activity: Not on file  Other Topics Concern   Not on file  Social History Narrative   Not on file   Social Determinants of Health   Financial Resource Strain: High Risk   Difficulty of Paying Living Expenses: Hard  Food Insecurity: No Food Insecurity   Worried About Running Out of Food in the Last Year: Never true   Ran Out of Food in the Last Year: Never true  Transportation Needs: No Transportation Needs   Lack of Transportation (Medical): No   Lack of Transportation (Non-Medical): No  Physical Activity: Not on file  Stress: Not on file  Social Connections: Not on file  Intimate Partner Violence: Not on file      Family History  Problem Relation Age of Onset   Breast cancer Mother    Bone cancer Mother    Melanoma Mother    Prostate cancer Father    Pancreatic cancer Maternal Grandfather    Diabetes Paternal Aunt    Colon cancer Neg Hx    Colon polyps Neg Hx    Esophageal cancer Neg Hx    Stomach cancer Neg Hx     There were no vitals filed for this visit.  PHYSICAL EXAM: General:  Well appearing. No respiratory difficulty HEENT: normal Neck: supple. no JVD. Carotids 2+ bilat; no bruits. No lymphadenopathy or thryomegaly appreciated. Cor: PMI nondisplaced.  Regular rate & rhythm. No rubs, gallops or murmurs. Lungs: clear Abdomen: soft, nontender, nondistended. No hepatosplenomegaly. No bruits or masses. Good bowel sounds. Extremities: no cyanosis, clubbing, rash, edema Neuro: alert & oriented x 3, cranial nerves grossly intact. moves all 4 extremities w/o difficulty. Affect pleasant.  ECG:   ASSESSMENT & PLAN:  NYHA *** GDMT  Diuretic- BB- Ace/ARB/ARNI MRA SGLT2i  ReDs Clip   Needs formal RHC. Would also benefit from possible Cardiomemes implant. ? Arlyce Harman    Referred to HFSW (PCP, Medications, Transportation, ETOH Abuse, Drug Abuse, Insurance, Financial ): Yes or No Refer to Pharmacy: Yes or No Refer to Home Health: Yes on No Refer to Advanced Heart Failure Clinic: Yes or no  Refer to General Cardiology: Yes or No  Follow up

## 2021-05-12 ENCOUNTER — Encounter (HOSPITAL_COMMUNITY): Payer: Medicare Other

## 2021-05-16 ENCOUNTER — Telehealth (HOSPITAL_COMMUNITY): Payer: Self-pay | Admitting: Emergency Medicine

## 2021-05-16 DIAGNOSIS — I5033 Acute on chronic diastolic (congestive) heart failure: Secondary | ICD-10-CM

## 2021-05-16 DIAGNOSIS — Z91041 Radiographic dye allergy status: Secondary | ICD-10-CM

## 2021-05-16 DIAGNOSIS — I2584 Coronary atherosclerosis due to calcified coronary lesion: Secondary | ICD-10-CM

## 2021-05-16 DIAGNOSIS — I2510 Atherosclerotic heart disease of native coronary artery without angina pectoris: Secondary | ICD-10-CM

## 2021-05-16 MED ORDER — PREDNISONE 50 MG PO TABS: ORAL_TABLET | ORAL | 0 refills | Status: DC

## 2021-05-16 MED ORDER — DIPHENHYDRAMINE HCL 50 MG PO CAPS: ORAL_CAPSULE | ORAL | 0 refills | Status: DC

## 2021-05-16 NOTE — Telephone Encounter (Signed)
Reaching out to patient to offer assistance regarding upcoming cardiac imaging study; pt verbalizes understanding of appt date/time, parking situation and where to check in, pre-test NPO status and medications ordered, and verified current allergies; name and call back number provided for further questions should they arise Marchia Bond RN Navigator Cardiac Imaging [facility] [phone] office [phone] cell  13 hr prep + 5mg  nebivolol  Denies iv issues Holding lasix Arrival 1030

## 2021-05-18 ENCOUNTER — Other Ambulatory Visit: Payer: Self-pay | Admitting: Internal Medicine

## 2021-05-18 ENCOUNTER — Ambulatory Visit (HOSPITAL_COMMUNITY)
Admission: RE | Admit: 2021-05-18 | Discharge: 2021-05-18 | Disposition: A | Discharge status: Home / Self Care | Payer: Medicare Other | Source: Ambulatory Visit | Attending: Student | Admitting: Student

## 2021-05-18 ENCOUNTER — Encounter (HOSPITAL_COMMUNITY): Payer: Self-pay

## 2021-05-18 ENCOUNTER — Other Ambulatory Visit: Payer: Self-pay

## 2021-05-18 DIAGNOSIS — I2584 Coronary atherosclerosis due to calcified coronary lesion: Secondary | ICD-10-CM | POA: Diagnosis present

## 2021-05-18 DIAGNOSIS — I5033 Acute on chronic diastolic (congestive) heart failure: Secondary | ICD-10-CM

## 2021-05-18 DIAGNOSIS — R931 Abnormal findings on diagnostic imaging of heart and coronary circulation: Secondary | ICD-10-CM

## 2021-05-18 DIAGNOSIS — Z91041 Radiographic dye allergy status: Secondary | ICD-10-CM | POA: Diagnosis present

## 2021-05-18 DIAGNOSIS — I2510 Atherosclerotic heart disease of native coronary artery without angina pectoris: Secondary | ICD-10-CM

## 2021-05-18 MED ORDER — NITROGLYCERIN 0.4 MG SL SUBL
0.8000 mg | SUBLINGUAL_TABLET | Freq: Once | SUBLINGUAL | Status: AC
Start: 2021-05-18 — End: 2021-05-18

## 2021-05-18 MED ORDER — NITROGLYCERIN 0.4 MG SL SUBL
SUBLINGUAL_TABLET | SUBLINGUAL | Status: AC
  Administered 2021-05-18: 0.8 mg via SUBLINGUAL
  Filled 2021-05-18: qty 2

## 2021-05-18 MED ORDER — IOHEXOL 350 MG/ML SOLN
95.0000 mL | Freq: Once | INTRAVENOUS | Status: AC | PRN
  Administered 2021-05-18: 95 mL via INTRAVENOUS

## 2021-05-18 NOTE — Progress Notes (Signed)
Please send for FFR - thanks, Dr. Debara Pickett

## 2021-05-19 ENCOUNTER — Ambulatory Visit (HOSPITAL_COMMUNITY)
Admission: RE | Admit: 2021-05-19 | Discharge: 2021-05-19 | Disposition: A | Discharge status: Home / Self Care | Payer: Medicare Other | Source: Ambulatory Visit | Attending: Internal Medicine | Admitting: Internal Medicine

## 2021-05-19 ENCOUNTER — Encounter: Payer: Self-pay | Admitting: Cardiovascular Disease

## 2021-05-19 DIAGNOSIS — I5033 Acute on chronic diastolic (congestive) heart failure: Secondary | ICD-10-CM | POA: Diagnosis not present

## 2021-05-19 DIAGNOSIS — R931 Abnormal findings on diagnostic imaging of heart and coronary circulation: Secondary | ICD-10-CM

## 2021-05-19 NOTE — Progress Notes (Signed)
I discussed the findings of the coronary CT angiogram and CT FFR with Jesse Lane.  He has what appears to be a significant stenosis in the mid right coronary artery, but also has widespread atherosclerotic plaque with multiple areas of moderate disease and a calcium score that places him in the 82nd percentile.  He is not currently experiencing angina pectoris at rest or with activity.  He is not having any active symptoms of congestive heart failure. Unless he develops angina pectoris, I do not think there is any reason to believe he would derive benefit from percutaneous revascularization of the right coronary stenosis.  It will be much more important to focus on aggressive risk factor modification, in particular keeping his LDL less than 70, good glycemic control, healthy diet, regular exercise. He should call if he develops symptoms of chest discomfort.  Otherwise he will follow-up on 06/07/2021 with [name], as scheduled.

## 2021-05-19 NOTE — Progress Notes (Signed)
I called Jesse Lane to discuss the results of his coronary CT angiogram, which showed a significant stenosis in the mid right coronary artery as well as widespread coronary atherosclerosis with multiple areas of mild-moderate disease and a coronary calcium score that places him in the 82nd percentile. He is not currently experiencing angina either at rest or with activity.  His heart failure symptoms are currently well controlled.  He has preserved left ventricular systolic function. The lesion appears to be amenable to percutaneous revascularization, but in the absence of angina pectoris I do not think there would be any benefit to revascularization of the right coronary artery.  Rather, it is more important to focus on risk factor modification (maintaining LDL cholesterol less than 70, good glycemic control, blood pressure in normal range, healthy diet, regular exercise, etc.). He should call if he develops angina pectoris. Otherwise keep scheduled appointment with [name] on 06/07/2021.

## 2021-06-06 NOTE — Progress Notes (Signed)
Cardiology Office Note:    Date:  06/07/2021   ID:  Jesse Lane, DOB May 02, 1955, MRN [id number]  PCP:  [name], MD [place] Cardiologist: Sanda Klein, MD   Reason for visit: 1 month hospital follow-up  History of Present Illness:    GEAROLD Lane is a 66 y.o. male with a hx of chronic diastolic heart failure, diabetes, hypertension and chronic kidney disease who presented with worsening shortness of breath in January 2023.  Treated with IV Lasix and discharged with Lasix 80 mg 1 tablet daily.  With coronary calcification on CT scan, he was scheduled for coronary CTA.  This showed coronary CT angiogram, which showed a significant stenosis in the mid right coronary artery as well as widespread coronary atherosclerosis with multiple areas of mild-moderate disease and a coronary calcium score that places him in the 82nd percentile.  With no angina and preserved EF, he is treated medically with a focus on risk factor modification.  Today, he overall is doing well except for the new dizziness that started last week.  He states he is in physical therapy for diabetic neuropathy and over the past week he has felt dizzy and weak.  His weight is down 10 pounds to 180 pounds.  He denies heart failure symptoms including shortness of breath, PND, orthopnea lower extremity edema.  He denies chest pain.  He mentions he does not always take his potassium supplement.  He also has difficulty having the Bystolic tablet.      Past Medical History:  Diagnosis Date   Arthritis    Diabetes mellitus type 2, uncontrolled    Diabetic neuropathy (HCC)    GERD (gastroesophageal reflux disease)    History of stomach ulcers    Hypertension    Melanoma ([place])    mets to lymph node and intestines    Past Surgical History:  Procedure Laterality Date   ABCESS DRAINAGE     gluteal area   COLONOSCOPY     INGUINAL HERNIA REPAIR N/A 08/15/2015   Procedure: HERNIA REPAIR INGUINAL  INCARCERATED;  Surgeon: Alphonsa Overall, MD;  Location: [facility];  Service: General;  Laterality: N/A;  with MESH   pyloric stenosis     TONSILLECTOMY      Current Medications: Current Meds  Medication Sig   acetaminophen (TYLENOL) 500 MG tablet Take 1,000 mg by mouth every 6 (six) hours as needed for mild pain.   alendronate (FOSAMAX) 70 MG tablet Take 70 mg by mouth once a week. Wednesday's   amLODipine (NORVASC) 5 MG tablet Take 1 tablet (5 mg total) by mouth daily.   Brimonidine Tartrate (LUMIFY) 0.025 % SOLN Place 1 drop into both eyes 2 (two) times daily as needed (irritated/dry eyes).   doxycycline (VIBRAMYCIN) 100 MG capsule Take 100 mg by mouth 2 (two) times daily.   furosemide (LASIX) 80 MG tablet Take 1 tablet (80 mg total) by mouth daily. (Patient taking differently: Take 40 mg by mouth daily. Take 0.5 Tablets Daily.)   insulin degludec (TRESIBA FLEXTOUCH) 100 UNIT/ML FlexTouch Pen Inject 25 Units into the skin daily before breakfast.   insulin lispro (HUMALOG) 100 UNIT/ML KwikPen Inject 7-15 Units into the skin See admin instructions. 7 units in the morning, 7 units at lunch, and 8 units in the evening. May take additional units as needed, max of 50 units per day   Insulin Pen Needle (BD PEN NEEDLE MICRO U/F) 32G X 6 MM MISC 1 each by Other route See  admin instructions. Use one pen needle to inject insulin 4 times daily. **must have appointment for additional refills.**   Insulin Pen Needle (BD PEN NEEDLE MICRO U/F) 32G X 6 MM MISC USE 1 PEN NEEDLE SIX TIMES DAILY (Patient taking differently: 1 each by Other route See admin instructions. USE 1 PEN NEEDLE SIX TIMES DAILY)   lisinopril (PRINIVIL,ZESTRIL) 40 MG tablet Take 40 mg by mouth at bedtime.    MELATONIN PO Take 1 tablet by mouth at bedtime as needed (sleep).   montelukast (SINGULAIR) 10 MG tablet Take 10 mg by mouth at bedtime.   Multiple Vitamin (MULTIVITAMIN WITH MINERALS) TABS tablet Take 1 tablet by mouth daily.   nebivolol  (BYSTOLIC) 2.5 MG tablet Take 1 tablet (2.5 mg total) by mouth daily.   ondansetron (ZOFRAN) 4 MG tablet Take 1 tablet (4 mg total) by mouth every 6 (six) hours as needed for nausea.   pantoprazole (PROTONIX) 40 MG tablet Take 1 tablet (40 mg total) by mouth daily as needed (heartburn).   potassium chloride SA (KLOR-CON M) 20 MEQ tablet 20 mEq daily (Patient taking differently: Take 20 mEq by mouth daily.)   pravastatin (PRAVACHOL) 20 MG tablet Take 20 mg by mouth daily.   predniSONE (DELTASONE) 50 MG tablet Take one tablet 13 hours, 7 hours, and 1 hour prior to scan.   pregabalin (LYRICA) 100 MG capsule Take 200-300 mg by mouth See admin instructions. Take 2 capsule (200 mg) by mouth every morning and take three capsules (300 mg) at bedtime   traZODone (DESYREL) 100 MG tablet Take 100 mg by mouth at bedtime.   [DISCONTINUED] dapagliflozin propanediol (FARXIGA) 10 MG TABS tablet Take 1 tablet (10 mg total) by mouth daily.   [DISCONTINUED] nebivolol (BYSTOLIC) 5 MG tablet Take 1 tablet (5 mg total) by mouth at bedtime.     Allergies:   Contrast media [iodinated contrast media]   Social History   Socioeconomic History   Marital status: Significant Other    Spouse name: Tula Nakayama- S/O   Number of children: 1   Years of education: Not on file   Highest education level: Some college, no degree  Occupational History   Occupation: retired  Tobacco Use   Smoking status: Former    Packs/day: 1.00    Years: 8.00    Pack years: 8.00    Types: Cigarettes    Quit date: 1998    Years since quitting: 25.1   Smokeless tobacco: Never  Vaping Use   Vaping Use: Never used  Substance and Sexual Activity   Alcohol use: Yes    Alcohol/week: 4.0 standard drinks    Types: 4 Glasses of wine per week    Comment: occasionally   Drug use: Not Currently    Frequency: 7.0 times per week    Types: Marijuana    Comment: daily   Sexual activity: Not on file  Other Topics Concern   Not on file   Social History Narrative   Not on file   Social Determinants of Health   Financial Resource Strain: High Risk   Difficulty of Paying Living Expenses: Hard  Food Insecurity: No Food Insecurity   Worried About Running Out of Food in the Last Year: Never true   Ran Out of Food in the Last Year: Never true  Transportation Needs: No Transportation Needs   Lack of Transportation (Medical): No   Lack of Transportation (Non-Medical): No  Physical Activity: Not on file  Stress: Not on file  Social Connections: Not on file     Family History: The patient's family history includes Bone cancer in his mother; Breast cancer in his mother; Diabetes in his paternal aunt; Melanoma in his mother; Pancreatic cancer in his maternal grandfather; Prostate cancer in his father. There is no history of Colon cancer, Colon polyps, Esophageal cancer, or Stomach cancer.  ROS:   Please see the history of present illness.     EKGs/Labs/Other Studies Reviewed:    EKG:  The ekg ordered today demonstrates sinus bradycardia with first-degree AV block, heart 57  Recent Labs: 05/03/2021: B Natriuretic Peptide 491.2 05/04/2021: ALT 11; Hemoglobin 11.3; Platelets 170 05/05/2021: BUN 16; Creatinine, Ser 1.57; Magnesium 1.7; Potassium 4.0; Sodium 137   Recent Lipid Panel Lab Results  Component Value Date/Time   CHOL 150 10/17/2019 02:55 AM   TRIG 73 01/23/2021 08:38 PM   HDL 23 (L) 10/17/2019 02:55 AM   LDLCALC 90 10/17/2019 02:55 AM    Physical Exam:    VS:  BP 120/60    Pulse (!) 57    Ht 5\' 11"  (1.803 m)    Wt 182 lb 3.2 oz (82.6 kg)    SpO2 97%    BMI 25.41 kg/m    No data found.  Wt Readings from Last 3 Encounters:  06/07/21 182 lb 3.2 oz (82.6 kg)  05/04/21 189 lb 3.2 oz (85.8 kg)  04/15/21 204 lb 12.9 oz (92.9 kg)     GEN:  Well nourished, well developed in no acute distress HEENT: Normal NECK: No JVD; No carotid bruits CARDIAC: RRR, no murmurs, rubs, gallops RESPIRATORY:  Clear to  auscultation without rales, wheezing or rhonchi  ABDOMEN: Soft, non-tender, non-distended MUSCULOSKELETAL: No edema; No deformity  SKIN: Warm and dry NEUROLOGIC:  Alert and oriented PSYCHIATRIC:  Normal affect     ASSESSMENT AND PLAN   Chronic diastolic heart failure, euvolemic -Estimated dry weight around 190 pounds. -Started on Farxiga in January 2023 -With his dizziness after the addition of Farxiga and weight loss, we will decrease his Lasix to 40 mg daily. -Continue lisinopril. -Check BMET today.  CAD with no angina -CTA coronaries 04/2021: significant stenosis in the mid right coronary artery as well as widespread coronary atherosclerosis with multiple areas of mild-moderate disease  -With no angina and preserved EF, he is treated medically with a focus on risk factor modification. -Continue statin therapy with goal LDL less than 70.  Continue pravastatin.  Hypertension, well controlled -Now with dizziness.  If his dizziness does not get better with cutting back on Lasix, we may consider cutting back on his amlodipine.  He may be overmedicated now that he is lost 20 pounds since last June. -Goal BP is <130/80.  Recommend DASH diet (high in vegetables, fruits, low-fat dairy products, whole grains, poultry, fish, and nuts and low in sweets, sugar-sweetened beverages, and red meats), salt restriction and increase physical activity.  Hyperlipidemia -LDL 90 in 2021.  Order fasting lipids. -Continue pravastatin. -Discussed cholesterol lowering diets - Mediterranean diet, DASH diet, vegetarian diet, low-carbohydrate diet and avoidance of trans fats.  Discussed healthier choice substitutes.  Nuts, high-fiber foods, and fiber supplements may also improve lipids.     Disposition - Follow-up in 4 to 6 weeks to make sure dizziness has improved and heart failure still controlled with reduced Lasix dose.  Reassess if we need to cut back on his hypertensive meds.        Medication  Adjustments/Labs and Tests Ordered: Current medicines are reviewed  at length with the patient today.  Concerns regarding medicines are outlined above.  Orders Placed This Encounter  Procedures   Basic metabolic panel   Lipid panel   EKG 12-Lead   Meds ordered this encounter  Medications   dapagliflozin propanediol (FARXIGA) 10 MG TABS tablet    Sig: Take 1 tablet (10 mg total) by mouth daily.    Dispense:  30 tablet    Refill:  0   nebivolol (BYSTOLIC) 2.5 MG tablet    Sig: Take 1 tablet (2.5 mg total) by mouth daily.    Dispense:  90 tablet    Refill:  3    Patient Instructions  Medication Instructions:  Decrease Bystolic from 5 mg to 2.5 mg ( 1 Tablet Daily). Decrease Lasix 40 mg ( Take 0.5 80 mg Tablet.) *If you need a refill on your cardiac medications before your next appointment, please call your pharmacy*   Lab Work: BMEt Today. Lipid Panel :  1 Week. If you have labs (blood work) drawn today and your tests are completely normal, you will receive your results only by: [place] (if you have MyChart) OR A paper copy in the mail If you have any lab test that is abnormal or we need to change your treatment, we will call you to review the results.   Testing/Procedures: No Testing   Follow-Up: At [facility], you and your health needs are our priority.  As part of our continuing mission to provide you with exceptional heart care, we have created designated Provider Care Teams.  These Care Teams include your primary Cardiologist (physician) and Advanced Practice Providers (APPs -  Physician Assistants and Nurse Practitioners) who all work together to provide you with the care you need, when you need it.  We recommend signing up for the patient portal called "MyChart".  Sign up information is provided on this After Visit Summary.  MyChart is used to connect with patients for Virtual Visits (Telemedicine).  Patients are able to view lab/test results, encounter  notes, upcoming appointments, etc.  Non-urgent messages can be sent to your provider as well.   To learn more about what you can do with MyChart, go to [email or web address].    Your next appointment:   4-6 week(s)  The format for your next appointment:   In Person  Provider:   [name], PA-C    Then, Sanda Klein, MD will plan to see you again in 3 month(s).       Signed, Warren Lacy, PA-C  06/07/2021 11:08 AM    [facility]

## 2021-06-07 ENCOUNTER — Ambulatory Visit (INDEPENDENT_AMBULATORY_CARE_PROVIDER_SITE_OTHER): Payer: Medicare Other | Admitting: Physician Assistant

## 2021-06-07 ENCOUNTER — Encounter: Payer: Self-pay | Admitting: Physician Assistant

## 2021-06-07 ENCOUNTER — Other Ambulatory Visit: Payer: Self-pay

## 2021-06-07 VITALS — BP 120/60 | HR 57 | Ht 71.0 in | Wt 182.2 lb

## 2021-06-07 DIAGNOSIS — E785 Hyperlipidemia, unspecified: Secondary | ICD-10-CM | POA: Diagnosis not present

## 2021-06-07 DIAGNOSIS — I2584 Coronary atherosclerosis due to calcified coronary lesion: Secondary | ICD-10-CM

## 2021-06-07 DIAGNOSIS — I10 Essential (primary) hypertension: Secondary | ICD-10-CM

## 2021-06-07 DIAGNOSIS — I2510 Atherosclerotic heart disease of native coronary artery without angina pectoris: Secondary | ICD-10-CM | POA: Diagnosis not present

## 2021-06-07 DIAGNOSIS — I5032 Chronic diastolic (congestive) heart failure: Secondary | ICD-10-CM | POA: Diagnosis not present

## 2021-06-07 LAB — BASIC METABOLIC PANEL
BUN/Creatinine Ratio: 21 (ref 10–24)
BUN: 48 mg/dL — ABNORMAL HIGH (ref 8–27)
CO2: 29 mmol/L (ref 20–29)
Calcium: 9.4 mg/dL (ref 8.6–10.2)
Chloride: 93 mmol/L — ABNORMAL LOW (ref 96–106)
Creatinine, Ser: 2.32 mg/dL — ABNORMAL HIGH (ref 0.76–1.27)
Glucose: 131 mg/dL — ABNORMAL HIGH (ref 70–99)
Potassium: 3.5 mmol/L (ref 3.5–5.2)
Sodium: 138 mmol/L (ref 134–144)
eGFR: 30 mL/min/{1.73_m2} — ABNORMAL LOW (ref >59)

## 2021-06-07 MED ORDER — DAPAGLIFLOZIN PROPANEDIOL 10 MG PO TABS: 10.0000 mg | ORAL_TABLET | Freq: Every day | ORAL | 0 refills | Status: DC

## 2021-06-07 MED ORDER — NEBIVOLOL HCL 2.5 MG PO TABS: 2.5000 mg | ORAL_TABLET | Freq: Every day | ORAL | 3 refills | Status: DC

## 2021-06-07 NOTE — Patient Instructions (Signed)
Medication Instructions:  ?Decrease Bystolic from 5 mg to 2.5 mg ( 1 Tablet Daily). ?Decrease Lasix 40 mg ( Take 0.5 80 mg Tablet.) ?*If you need a refill on your cardiac medications before your next appointment, please call your pharmacy* ? ? ?Lab Work: ?BMEt Today. ?Lipid Panel :  1 Week. ?If you have labs (blood work) drawn today and your tests are completely normal, you will receive your results only by: ?MyChart Message (if you have MyChart) OR ?A paper copy in the mail ?If you have any lab test that is abnormal or we need to change your treatment, we will call you to review the results. ? ? ?Testing/Procedures: ?No Testing ? ? ?Follow-Up: ?At [facility], you and your health needs are our priority.  As part of our continuing mission to provide you with exceptional heart care, we have created designated Provider Care Teams.  These Care Teams include your primary Cardiologist (physician) and Advanced Practice Providers (APPs -  Physician Assistants and Nurse Practitioners) who all work together to provide you with the care you need, when you need it. ? ?We recommend signing up for the patient portal called "MyChart".  Sign up information is provided on this After Visit Summary.  MyChart is used to connect with patients for Virtual Visits (Telemedicine).  Patients are able to view lab/test results, encounter notes, upcoming appointments, etc.  Non-urgent messages can be sent to your provider as well.   ?To learn more about what you can do with MyChart, go to [email or web address].   ? ?Your next appointment:   ?4-6 week(s) ? ?The format for your next appointment:   ?In Person ? ?Provider:   ?[name], PA-C    Then, Sanda Klein, MD will plan to see you again in 3 month(s).  ? ?  ?

## 2021-06-08 ENCOUNTER — Other Ambulatory Visit: Payer: Self-pay

## 2021-06-08 ENCOUNTER — Telehealth: Payer: Self-pay

## 2021-06-08 DIAGNOSIS — I10 Essential (primary) hypertension: Secondary | ICD-10-CM

## 2021-06-08 NOTE — Telephone Encounter (Addendum)
Patient called regarding lab results by Ulyess Blossom. Patient advised will need to repeat blood test.----- Message from Warren Lacy, PA-C sent at 06/08/2021  1:26 PM EST ----- ?Patient with acute on chronic kidney failure and dizziness.  Kidney function worsened after started on Farxiga in January 2023, weight loss and higher dose of lasix.   ?-Stop [place].  (Can reassess candidacy at April appt) ?-Hold lasix x 3 days and then resume at 40mg  daily.  Check BMET in 1 week to ensure downtrending.  Monitor for CHF symptoms. ?Colletta [place]- please add BMET to his fasting lipids for next week. ?Already discussed plan with patient on the phone. ? ?

## 2021-06-12 ENCOUNTER — Telehealth: Payer: Self-pay | Admitting: Physician Assistant

## 2021-06-12 DIAGNOSIS — I10 Essential (primary) hypertension: Secondary | ICD-10-CM

## 2021-06-12 NOTE — Telephone Encounter (Signed)
Jesse Lacy, PA-C  ?06/08/2021  1:26 PM EST Back to Top  ?  ?Patient with acute on chronic kidney failure and dizziness.  Kidney function worsened after started on Farxiga in January 2023, weight loss and higher dose of lasix.   ?-Stop [place].  (Can reassess candidacy at April appt) ?-Hold lasix x 3 days and then resume at '40mg'$  daily.  Check BMET in 1 week to ensure downtrending.  Monitor for CHF symptoms. ?Colletta [place]- please add BMET to his fasting lipids for next week. ?Already discussed plan with patient on the phone.  ? ? ?Returned call to patient-patient wanted to confirm how he was suppose to take these medications.  He is aware to continue to hold [place] and restart furosemide at 40 mg daily.  He is planning to have labs repeated on Friday.   Patient verbalized understanding.  ?

## 2021-06-12 NOTE — Telephone Encounter (Signed)
Pt c/o medication issue: ? ?1. Name of Medication:  ?dapagliflozin propanediol (FARXIGA) 10 MG TABS tablet ?furosemide (LASIX) 80 MG tablet  ? ?2. How are you currently taking this medication (dosage and times per day)? Not currently taking ? ?3. Are you having a reaction (difficulty breathing--STAT)? no ? ?4. What is your medication issue? Patient states he was taken off the medications until today due to dehydration. He would like to know what he needs to do now. ?

## 2021-06-16 LAB — LIPID PANEL
Chol/HDL Ratio: 5.4 ratio — ABNORMAL HIGH (ref 0.0–5.0)
Cholesterol, Total: 147 mg/dL (ref 100–199)
HDL: 27 mg/dL — ABNORMAL LOW (ref >39)
LDL Chol Calc (NIH): 73 mg/dL (ref 0–99)
Triglycerides: 288 mg/dL — ABNORMAL HIGH (ref 0–149)
VLDL Cholesterol Cal: 47 mg/dL — ABNORMAL HIGH (ref 5–40)

## 2021-06-21 ENCOUNTER — Telehealth: Payer: Self-pay

## 2021-06-21 ENCOUNTER — Other Ambulatory Visit: Payer: Self-pay

## 2021-06-21 DIAGNOSIS — I2510 Atherosclerotic heart disease of native coronary artery without angina pectoris: Secondary | ICD-10-CM

## 2021-06-21 DIAGNOSIS — E785 Hyperlipidemia, unspecified: Secondary | ICD-10-CM

## 2021-06-21 MED ORDER — ROSUVASTATIN CALCIUM 20 MG PO TABS: 20.0000 mg | ORAL_TABLET | Freq: Every day | ORAL | 3 refills | Status: DC

## 2021-06-21 NOTE — Telephone Encounter (Addendum)
Left voice message for patient to give office a call for lab results and other information. ? ?----- Message from Warren Lacy, PA-C sent at 06/21/2021  9:02 AM EDT ----- ?Bad cholesterol is over 70, good cholesterol is low and triglycerides are high.  With your known heart disease and your young age, we should be more aggressive with your cholesterol control.  I recommend changing to Crestor 20 mg daily.   ?Jesse Lane -- please discuss with pt to send in RX for Crestor '20mg'$  daily and lipid recheck in 3 months.   ? ?To help lower your bad cholesterol:  ?- Mediterranean diet, DASH diet, Vegetarian (or other meat restricted) diet, Low-carbohydrate diet.  Can lower LDL by 15-30%. ?- Avoid trans fats: ex. stick margarine, shortening, fried foods, cakes and pies ?- Nuts, high-fiber foods (oats, barley, lentil, split peas, beans, fruits and vegetables), and fiber supplements may be added to any diet to improve lipids.                                        ?

## 2021-06-22 ENCOUNTER — Telehealth: Payer: Self-pay

## 2021-06-22 MED ORDER — ROSUVASTATIN CALCIUM 20 MG PO TABS: 20.0000 mg | ORAL_TABLET | Freq: Every day | ORAL | 3 refills | Status: DC

## 2021-06-22 NOTE — Telephone Encounter (Addendum)
Patient contacted today  by Melvenia Needles PA-C regarding results and medication changes.----- Message from Jacqulynn Cadet, [place] sent at 06/21/2021  4:37 PM EDT ----- ?The patient has been notified of the result. Patient would like for you to give him a call. He is concerned about his numbers being high/elevated because this is the first time he is hearing about his numbers. Placed order for new Rx and repeat labs  Jacqulynn Cadet, [facility] 06/21/2021 4:30 PM  ? ? ?

## 2021-06-27 ENCOUNTER — Other Ambulatory Visit: Payer: Self-pay | Admitting: Gastroenterology

## 2021-07-12 NOTE — Progress Notes (Signed)
?Cardiology Office Note:   ? ?Date:  07/13/2021  ? ?ID:  [name], DOB 10-23-1955, MRN [id number] ? ?PCP:  [name], MD ?[place] Cardiologist: Sanda Klein, MD  ? ?Reason for visit: Follow-up ? ?History of Present Illness:   ? ?Jesse Lane is a 66 y.o. male with a hx of chronic diastolic heart failure, diabetes, hypertension and chronic kidney disease who presented with worsening shortness of breath in January 2023.  Treated with IV Lasix and discharged with Lasix 80 mg 1 tablet daily. ?  ?With coronary calcification on CT scan, he was scheduled for coronary CTA.  CTA showed a significant stenosis in the mid right coronary artery as well as widespread coronary atherosclerosis with multiple areas of mild-moderate disease and a coronary calcium score that places him in the 82nd percentile.  With no angina and preserved EF, he is treated medically with a focus on risk factor modification. ? ?I last saw pt on 06/07/2021.  Patient complained of new dizziness and weakness.  He was down 10 pounds.  I believe the diuretic effect was compounded with [place].  We decreased his Lasix to 40 mg daily.  With AKI,  [place] Glade was stopped. ? ?Today, he states he feels good.  He has been taking Lasix 40 mg daily.  He is only had 1 episode of lightheadedness 2 days ago.  He has had decreased appetite he thinks may be related to gastroparesis.  He denies PND, orthopnea and lower extremity edema.  He is only had 1 fleeting episode of shortness of breath.  His baseline weight seems to be between 186 and 188 pounds.  If his weight gets above 191 pounds.  He takes extra Lasix 40 mg daily.  He request nutritionist referral. ?  ?  ?Past Medical History:  ?Diagnosis Date  ? Arthritis   ? Diabetes mellitus type 2, uncontrolled   ? Diabetic neuropathy ([place])   ? GERD (gastroesophageal reflux disease)   ? History of stomach ulcers   ? Hypertension   ? Melanoma ([place])   ? mets to lymph node and intestines  ? ? ?Past  Surgical History:  ?Procedure Laterality Date  ? ABCESS DRAINAGE    ? gluteal area  ? COLONOSCOPY    ? INGUINAL HERNIA REPAIR N/A 08/15/2015  ? Procedure: HERNIA REPAIR INGUINAL INCARCERATED;  Surgeon: Alphonsa Overall, MD;  Location: [facility];  Service: General;  Laterality: N/A;  with MESH  ? pyloric stenosis    ? TONSILLECTOMY    ? ? ?Current Medications: ?Current Meds  ?Medication Sig  ? acetaminophen (TYLENOL) 500 MG tablet Take 1,000 mg by mouth every 6 (six) hours as needed for mild pain.  ? alendronate (FOSAMAX) 70 MG tablet Take 70 mg by mouth once a week. Wednesday's  ? amLODipine (NORVASC) 5 MG tablet Take 1 tablet (5 mg total) by mouth daily.  ? Brimonidine Tartrate (LUMIFY) 0.025 % SOLN Place 1 drop into both eyes 2 (two) times daily as needed (irritated/dry eyes).  ? doxycycline (VIBRAMYCIN) 100 MG capsule Take 100 mg by mouth 2 (two) times daily.  ? furosemide (LASIX) 40 MG tablet Take 40 mg by mouth. Take 1 Tablet Daily.  ? insulin degludec (TRESIBA FLEXTOUCH) 100 UNIT/ML FlexTouch Pen Inject 25 Units into the skin daily before breakfast.  ? insulin lispro (HUMALOG) 100 UNIT/ML KwikPen Inject 7-15 Units into the skin See admin instructions. 7 units in the morning, 7 units at lunch, and 8 units in the evening.  May take additional units as needed, max of 50 units per day  ? Insulin Pen Needle (BD PEN NEEDLE MICRO U/F) 32G X 6 MM MISC 1 each by Other route See admin instructions. Use one pen needle to inject insulin 4 times daily. **must have appointment for additional refills.**  ? Insulin Pen Needle (BD PEN NEEDLE MICRO U/F) 32G X 6 MM MISC USE 1 PEN NEEDLE SIX TIMES DAILY (Patient taking differently: 1 each by Other route See admin instructions. USE 1 PEN NEEDLE SIX TIMES DAILY)  ? lisinopril (PRINIVIL,ZESTRIL) 40 MG tablet Take 40 mg by mouth at bedtime.   ? MELATONIN PO Take 1 tablet by mouth at bedtime as needed (sleep).  ? montelukast (SINGULAIR) 10 MG tablet Take 10 mg by mouth at bedtime.  ? Multiple  Vitamin (MULTIVITAMIN WITH MINERALS) TABS tablet Take 1 tablet by mouth daily.  ? nebivolol (BYSTOLIC) 2.5 MG tablet Take 1 tablet (2.5 mg total) by mouth daily.  ? ondansetron (ZOFRAN) 4 MG tablet Take 1 tablet (4 mg total) by mouth every 6 (six) hours as needed for nausea.  ? pantoprazole (PROTONIX) 40 MG tablet Take 1 tablet by mouth twice daily  ? potassium chloride SA (KLOR-CON M) 20 MEQ tablet 20 mEq daily (Patient taking differently: Take 20 mEq by mouth daily.)  ? pregabalin (LYRICA) 100 MG capsule Take 200-300 mg by mouth See admin instructions. Take 2 capsule (200 mg) by mouth every morning and take three capsules (300 mg) at bedtime  ? rosuvastatin (CRESTOR) 20 MG tablet Take 1 tablet (20 mg total) by mouth daily.  ? traZODone (DESYREL) 100 MG tablet Take 100 mg by mouth at bedtime.  ? [DISCONTINUED] furosemide (LASIX) 80 MG tablet Take 1 tablet (80 mg total) by mouth daily. (Patient taking differently: Take 40 mg by mouth daily. Take 0.5 Tablets Daily.)  ?  ? ?Allergies:   Contrast media [iodinated contrast media]  ? ?Social History  ? ?Socioeconomic History  ? Marital status: Significant Other  ?  Spouse name: Tula Nakayama- S/O  ? Number of children: 1  ? Years of education: Not on file  ? Highest education level: Some college, no degree  ?Occupational History  ? Occupation: retired  ?Tobacco Use  ? Smoking status: Former  ?  Packs/day: 1.00  ?  Years: 8.00  ?  Pack years: 8.00  ?  Types: Cigarettes  ?  Quit date: 1998  ?  Years since quitting: 25.2  ? Smokeless tobacco: Never  ?Vaping Use  ? Vaping Use: Never used  ?Substance and Sexual Activity  ? Alcohol use: Yes  ?  Alcohol/week: 4.0 standard drinks  ?  Types: 4 Glasses of wine per week  ?  Comment: occasionally  ? Drug use: Not Currently  ?  Frequency: 7.0 times per week  ?  Types: Marijuana  ?  Comment: daily  ? Sexual activity: Not on file  ?Other Topics Concern  ? Not on file  ?Social History Narrative  ? Not on file  ? ?Social Determinants of  Health  ? ?Financial Resource Strain: High Risk  ? Difficulty of Paying Living Expenses: Hard  ?Food Insecurity: No Food Insecurity  ? Worried About Charity fundraiser in the Last Year: Never true  ? Ran Out of Food in the Last Year: Never true  ?Transportation Needs: No Transportation Needs  ? Lack of Transportation (Medical): No  ? Lack of Transportation (Non-Medical): No  ?Physical Activity: Not on file  ?Stress:  Not on file  ?Social Connections: Not on file  ?  ? ?Family History: ?The patient's family history includes Bone cancer in his mother; Breast cancer in his mother; Diabetes in his paternal aunt; Melanoma in his mother; Pancreatic cancer in his maternal grandfather; Prostate cancer in his father. There is no history of Colon cancer, Colon polyps, Esophageal cancer, or Stomach cancer. ? ?ROS:   ?Please see the history of present illness.    ? ?EKGs/Labs/Other Studies Reviewed:   ? ?Recent Labs: ?05/03/2021: B Natriuretic Peptide 491.2 ?05/04/2021: ALT 11; Hemoglobin 11.3; Platelets 170 ?05/05/2021: Magnesium 1.7 ?06/07/2021: BUN 48; Creatinine, Ser 2.32; Potassium 3.5; Sodium 138  ? ?Recent Lipid Panel ?Lab Results  ?Component Value Date/Time  ? CHOL 147 06/16/2021 08:37 AM  ? TRIG 288 (H) 06/16/2021 08:37 AM  ? HDL 27 (L) 06/16/2021 08:37 AM  ? LDLCALC 73 06/16/2021 08:37 AM  ? ? ?Physical Exam:   ? ?VS:  BP 130/76   Pulse (!) 58   Ht '5\' 11"'$  (1.803 m)   Wt 188 lb 3.2 oz (85.4 kg)   SpO2 98%   BMI 26.25 kg/m?    ?No data found. ? ?Wt Readings from Last 3 Encounters:  ?07/13/21 188 lb 3.2 oz (85.4 kg)  ?06/07/21 182 lb 3.2 oz (82.6 kg)  ?05/04/21 189 lb 3.2 oz (85.8 kg)  ?  ? ?GEN:  Well nourished, well developed in no acute distress ?HEENT: Normal ?NECK: No JVD; No carotid bruits ?CARDIAC: RRR, no murmurs, rubs, gallops ?RESPIRATORY:  Clear to auscultation without rales, wheezing or rhonchi  ?ABDOMEN: Soft, non-tender, non-distended ?MUSCULOSKELETAL: No edema; No deformity  ?SKIN: Warm and  dry ?NEUROLOGIC:  Alert and oriented ?PSYCHIATRIC:  Normal affect  ?  ?ASSESSMENT AND PLAN  ? ?Chronic diastolic heart failure, euvolemic ?-Estimated dry weight around 186-188 pounds. ?-With his dizziness after the addition

## 2021-07-13 ENCOUNTER — Encounter: Payer: Self-pay | Admitting: Physician Assistant

## 2021-07-13 ENCOUNTER — Ambulatory Visit (INDEPENDENT_AMBULATORY_CARE_PROVIDER_SITE_OTHER): Payer: Medicare Other | Admitting: Physician Assistant

## 2021-07-13 VITALS — BP 130/76 | HR 58 | Ht 71.0 in | Wt 188.2 lb

## 2021-07-13 DIAGNOSIS — I10 Essential (primary) hypertension: Secondary | ICD-10-CM

## 2021-07-13 DIAGNOSIS — I2510 Atherosclerotic heart disease of native coronary artery without angina pectoris: Secondary | ICD-10-CM

## 2021-07-13 DIAGNOSIS — I5032 Chronic diastolic (congestive) heart failure: Secondary | ICD-10-CM

## 2021-07-13 DIAGNOSIS — E785 Hyperlipidemia, unspecified: Secondary | ICD-10-CM

## 2021-07-13 DIAGNOSIS — I2584 Coronary atherosclerosis due to calcified coronary lesion: Secondary | ICD-10-CM

## 2021-07-13 NOTE — Patient Instructions (Signed)
Medication Instructions:  ?No Changes ?*If you need a refill on your cardiac medications before your next appointment, please call your pharmacy* ? ? ?Lab Work: ?BMET : Today ?If you have labs (blood work) drawn today and your tests are completely normal, you will receive your results only by: ?MyChart Message (if you have MyChart) OR ?A paper copy in the mail ?If you have any lab test that is abnormal or we need to change your treatment, we will call you to review the results. ? ? ?Testing/Procedures: ?No Testing ? ? ?Follow-Up: ?At [facility], you and your health needs are our priority.  As part of our continuing mission to provide you with exceptional heart care, we have created designated Provider Care Teams.  These Care Teams include your primary Cardiologist (physician) and Advanced Practice Providers (APPs -  Physician Assistants and Nurse Practitioners) who all work together to provide you with the care you need, when you need it. ? ?We recommend signing up for the patient portal called "MyChart".  Sign up information is provided on this After Visit Summary.  MyChart is used to connect with patients for Virtual Visits (Telemedicine).  Patients are able to view lab/test results, encounter notes, upcoming appointments, etc.  Non-urgent messages can be sent to your provider as well.   ?To learn more about what you can do with MyChart, go to [email or web address].   ? ?Your next appointment:   ?4 month(s) ? ?The format for your next appointment:   ?In Person ? ?Provider:   ?Sanda Klein, MD   ?

## 2021-07-17 ENCOUNTER — Telehealth: Payer: Self-pay

## 2021-07-17 NOTE — Telephone Encounter (Addendum)
Called patient regarding results. Patient had understanding of results.----- Message from Warren Lacy, PA-C sent at 07/17/2021 11:18 AM EDT ----- ?Good news, kidney function improved after after decreasing lasix dosing and stopping [place].  Potassium level normal.   ?

## 2021-07-26 LAB — BASIC METABOLIC PANEL
BUN/Creatinine Ratio: 15 (ref 10–24)
BUN: 26 mg/dL (ref 8–27)
Calcium: 9.5 mg/dL (ref 8.6–10.2)
Chloride: 100 mmol/L (ref 96–106)
Creatinine, Ser: 1.73 mg/dL — ABNORMAL HIGH (ref 0.76–1.27)
Glucose: 131 mg/dL — ABNORMAL HIGH (ref 70–99)
Potassium: 3.7 mmol/L (ref 3.5–5.2)
Sodium: 143 mmol/L (ref 134–144)
eGFR: 43 mL/min/{1.73_m2} — ABNORMAL LOW (ref >59)

## 2021-08-11 ENCOUNTER — Telehealth: Payer: Self-pay | Admitting: Physician Assistant

## 2021-08-11 ENCOUNTER — Other Ambulatory Visit: Payer: Self-pay | Admitting: Physician Assistant

## 2021-08-11 MED ORDER — ONDANSETRON HCL 4 MG PO TABS: 4.0000 mg | ORAL_TABLET | Freq: Four times a day (QID) | ORAL | 5 refills | Status: DC | PRN

## 2021-08-11 NOTE — Telephone Encounter (Signed)
Inbound call from patient requesting medication refill for Ondansetron sent to [facility] on [address] ?

## 2021-08-11 NOTE — Telephone Encounter (Signed)
Script sent into patient's pharmacy.  

## 2021-08-19 ENCOUNTER — Emergency Department (HOSPITAL_COMMUNITY): Payer: Medicare Other

## 2021-08-19 ENCOUNTER — Inpatient Hospital Stay (HOSPITAL_COMMUNITY)
Admission: EM | Admit: 2021-08-19 | Discharge: 2021-08-21 | DRG: 683 | Disposition: A | Discharge status: Home / Self Care | Payer: Medicare Other | Attending: Internal Medicine | Admitting: Internal Medicine

## 2021-08-19 ENCOUNTER — Other Ambulatory Visit: Payer: Self-pay

## 2021-08-19 ENCOUNTER — Encounter (HOSPITAL_COMMUNITY): Payer: Self-pay | Admitting: Emergency Medicine

## 2021-08-19 DIAGNOSIS — E860 Dehydration: Secondary | ICD-10-CM | POA: Diagnosis not present

## 2021-08-19 DIAGNOSIS — D638 Anemia in other chronic diseases classified elsewhere: Secondary | ICD-10-CM | POA: Diagnosis present

## 2021-08-19 DIAGNOSIS — E1122 Type 2 diabetes mellitus with diabetic chronic kidney disease: Secondary | ICD-10-CM | POA: Diagnosis present

## 2021-08-19 DIAGNOSIS — Z808 Family history of malignant neoplasm of other organs or systems: Secondary | ICD-10-CM

## 2021-08-19 DIAGNOSIS — E1165 Type 2 diabetes mellitus with hyperglycemia: Secondary | ICD-10-CM | POA: Diagnosis present

## 2021-08-19 DIAGNOSIS — E119 Type 2 diabetes mellitus without complications: Secondary | ICD-10-CM

## 2021-08-19 DIAGNOSIS — E1140 Type 2 diabetes mellitus with diabetic neuropathy, unspecified: Secondary | ICD-10-CM | POA: Diagnosis present

## 2021-08-19 DIAGNOSIS — Z800 Family history of malignant neoplasm of digestive organs: Secondary | ICD-10-CM

## 2021-08-19 DIAGNOSIS — N182 Chronic kidney disease, stage 2 (mild): Secondary | ICD-10-CM

## 2021-08-19 DIAGNOSIS — I951 Orthostatic hypotension: Secondary | ICD-10-CM

## 2021-08-19 DIAGNOSIS — N179 Acute kidney failure, unspecified: Principal | ICD-10-CM | POA: Diagnosis present

## 2021-08-19 DIAGNOSIS — R197 Diarrhea, unspecified: Secondary | ICD-10-CM | POA: Diagnosis present

## 2021-08-19 DIAGNOSIS — I2510 Atherosclerotic heart disease of native coronary artery without angina pectoris: Secondary | ICD-10-CM | POA: Diagnosis not present

## 2021-08-19 DIAGNOSIS — I5042 Chronic combined systolic (congestive) and diastolic (congestive) heart failure: Secondary | ICD-10-CM | POA: Diagnosis present

## 2021-08-19 DIAGNOSIS — C439 Malignant melanoma of skin, unspecified: Secondary | ICD-10-CM | POA: Diagnosis present

## 2021-08-19 DIAGNOSIS — I10 Essential (primary) hypertension: Secondary | ICD-10-CM | POA: Diagnosis present

## 2021-08-19 DIAGNOSIS — Z794 Long term (current) use of insulin: Secondary | ICD-10-CM

## 2021-08-19 DIAGNOSIS — K219 Gastro-esophageal reflux disease without esophagitis: Secondary | ICD-10-CM | POA: Diagnosis present

## 2021-08-19 DIAGNOSIS — Z87891 Personal history of nicotine dependence: Secondary | ICD-10-CM

## 2021-08-19 DIAGNOSIS — Z8042 Family history of malignant neoplasm of prostate: Secondary | ICD-10-CM

## 2021-08-19 DIAGNOSIS — I5032 Chronic diastolic (congestive) heart failure: Secondary | ICD-10-CM | POA: Diagnosis present

## 2021-08-19 DIAGNOSIS — E785 Hyperlipidemia, unspecified: Secondary | ICD-10-CM | POA: Diagnosis present

## 2021-08-19 DIAGNOSIS — Z833 Family history of diabetes mellitus: Secondary | ICD-10-CM

## 2021-08-19 DIAGNOSIS — Z91041 Radiographic dye allergy status: Secondary | ICD-10-CM

## 2021-08-19 DIAGNOSIS — R42 Dizziness and giddiness: Secondary | ICD-10-CM | POA: Diagnosis not present

## 2021-08-19 DIAGNOSIS — C7989 Secondary malignant neoplasm of other specified sites: Secondary | ICD-10-CM | POA: Diagnosis present

## 2021-08-19 DIAGNOSIS — Z803 Family history of malignant neoplasm of breast: Secondary | ICD-10-CM

## 2021-08-19 DIAGNOSIS — E876 Hypokalemia: Secondary | ICD-10-CM | POA: Diagnosis present

## 2021-08-19 DIAGNOSIS — E11649 Type 2 diabetes mellitus with hypoglycemia without coma: Secondary | ICD-10-CM

## 2021-08-19 DIAGNOSIS — N1832 Chronic kidney disease, stage 3b: Secondary | ICD-10-CM | POA: Diagnosis present

## 2021-08-19 DIAGNOSIS — Z79899 Other long term (current) drug therapy: Secondary | ICD-10-CM

## 2021-08-19 DIAGNOSIS — Z85820 Personal history of malignant melanoma of skin: Secondary | ICD-10-CM

## 2021-08-19 DIAGNOSIS — I130 Hypertensive heart and chronic kidney disease with heart failure and stage 1 through stage 4 chronic kidney disease, or unspecified chronic kidney disease: Secondary | ICD-10-CM | POA: Diagnosis present

## 2021-08-19 DIAGNOSIS — Z8711 Personal history of peptic ulcer disease: Secondary | ICD-10-CM

## 2021-08-19 LAB — CBC WITH DIFFERENTIAL/PLATELET
Abs Immature Granulocytes: 0.02 10*3/uL (ref 0.00–0.07)
Basophils Absolute: 0 10*3/uL (ref 0.0–0.1)
Basophils Relative: 0 %
Eosinophils Absolute: 0 10*3/uL (ref 0.0–0.5)
Eosinophils Relative: 0 %
HCT: 32.5 % — ABNORMAL LOW (ref 39.0–52.0)
Hemoglobin: 11.9 g/dL — ABNORMAL LOW (ref 13.0–17.0)
Immature Granulocytes: 0 %
Lymphocytes Relative: 34 %
Lymphs Abs: 1.5 10*3/uL (ref 0.7–4.0)
MCH: 31.6 pg (ref 26.0–34.0)
MCHC: 36.6 g/dL — ABNORMAL HIGH (ref 30.0–36.0)
MCV: 86.4 fL (ref 80.0–100.0)
Monocytes Absolute: 0.3 10*3/uL (ref 0.1–1.0)
Monocytes Relative: 7 %
Neutro Abs: 2.6 10*3/uL (ref 1.7–7.7)
Neutrophils Relative %: 59 %
Platelets: 145 10*3/uL — ABNORMAL LOW (ref 150–400)
RBC: 3.76 MIL/uL — ABNORMAL LOW (ref 4.22–5.81)
RDW: 13.3 % (ref 11.5–15.5)
WBC: 4.5 10*3/uL (ref 4.0–10.5)
nRBC: 0 % (ref 0.0–0.2)

## 2021-08-19 LAB — BASIC METABOLIC PANEL
Anion gap: 12 (ref 5–15)
BUN: 32 mg/dL — ABNORMAL HIGH (ref 8–23)
CO2: 24 mmol/L (ref 22–32)
Calcium: 9.1 mg/dL (ref 8.9–10.3)
Chloride: 103 mmol/L (ref 98–111)
Creatinine, Ser: 2.35 mg/dL — ABNORMAL HIGH (ref 0.61–1.24)
GFR, Estimated: 30 mL/min — ABNORMAL LOW (ref >60)
Glucose, Bld: 172 mg/dL — ABNORMAL HIGH (ref 70–99)
Potassium: 2.9 mmol/L — ABNORMAL LOW (ref 3.5–5.1)
Sodium: 139 mmol/L (ref 135–145)

## 2021-08-19 LAB — URINALYSIS, ROUTINE W REFLEX MICROSCOPIC
Bilirubin Urine: NEGATIVE
Glucose, UA: NEGATIVE mg/dL
Hgb urine dipstick: NEGATIVE
Ketones, ur: NEGATIVE mg/dL
Leukocytes,Ua: NEGATIVE
Nitrite: NEGATIVE
Protein, ur: 100 mg/dL — AB
Specific Gravity, Urine: 1.012 (ref 1.005–1.030)
pH: 6 (ref 5.0–8.0)

## 2021-08-19 LAB — SODIUM, URINE, RANDOM: Sodium, Ur: 69 mmol/L

## 2021-08-19 LAB — BRAIN NATRIURETIC PEPTIDE: B Natriuretic Peptide: 53.4 pg/mL (ref 0.0–100.0)

## 2021-08-19 LAB — CREATININE, URINE, RANDOM: Creatinine, Urine: 118.46 mg/dL

## 2021-08-19 LAB — MAGNESIUM: Magnesium: 1.9 mg/dL (ref 1.7–2.4)

## 2021-08-19 LAB — TROPONIN I (HIGH SENSITIVITY)
Troponin I (High Sensitivity): 20 ng/L — ABNORMAL HIGH (ref <18)
Troponin I (High Sensitivity): 22 ng/L — ABNORMAL HIGH (ref <18)

## 2021-08-19 LAB — CBG MONITORING, ED: Glucose-Capillary: 175 mg/dL — ABNORMAL HIGH (ref 70–99)

## 2021-08-19 MED ORDER — AMLODIPINE BESYLATE 5 MG PO TABS
5.0000 mg | ORAL_TABLET | Freq: Once | ORAL | Status: AC
  Administered 2021-08-19: 5 mg via ORAL
  Filled 2021-08-19: qty 1

## 2021-08-19 MED ORDER — TRAZODONE HCL 100 MG PO TABS
100.0000 mg | ORAL_TABLET | Freq: Every day | ORAL | Status: DC
  Administered 2021-08-19 – 2021-08-20 (×2): 100 mg via ORAL
  Filled 2021-08-19 (×2): qty 1

## 2021-08-19 MED ORDER — SODIUM CHLORIDE 0.9 % IV BOLUS
1000.0000 mL | Freq: Once | INTRAVENOUS | Status: AC
  Administered 2021-08-19: 1000 mL via INTRAVENOUS

## 2021-08-19 MED ORDER — POTASSIUM CHLORIDE 10 MEQ/100ML IV SOLN
10.0000 meq | Freq: Once | INTRAVENOUS | Status: AC
  Administered 2021-08-19: 10 meq via INTRAVENOUS
  Filled 2021-08-19: qty 100

## 2021-08-19 MED ORDER — NEBIVOLOL HCL 2.5 MG PO TABS
2.5000 mg | ORAL_TABLET | Freq: Once | ORAL | Status: AC
  Administered 2021-08-19: 2.5 mg via ORAL
  Filled 2021-08-19: qty 1

## 2021-08-19 MED ORDER — ONDANSETRON HCL 4 MG/2ML IJ SOLN
4.0000 mg | Freq: Once | INTRAMUSCULAR | Status: AC
  Administered 2021-08-19: 4 mg via INTRAVENOUS
  Filled 2021-08-19: qty 2

## 2021-08-19 MED ORDER — POTASSIUM CHLORIDE CRYS ER 20 MEQ PO TBCR
40.0000 meq | EXTENDED_RELEASE_TABLET | Freq: Once | ORAL | Status: AC
  Administered 2021-08-19: 40 meq via ORAL
  Filled 2021-08-19: qty 2

## 2021-08-19 MED ORDER — INSULIN ASPART 100 UNIT/ML IJ SOLN
0.0000 [IU] | INTRAMUSCULAR | Status: DC
  Filled 2021-08-19: qty 0.09

## 2021-08-19 MED ORDER — MELATONIN 3 MG PO TABS
3.0000 mg | ORAL_TABLET | Freq: Every day | ORAL | Status: DC
  Administered 2021-08-19 – 2021-08-20 (×2): 3 mg via ORAL
  Filled 2021-08-19 (×2): qty 1

## 2021-08-19 MED ORDER — LISINOPRIL 10 MG PO TABS: 40.0000 mg | ORAL_TABLET | Freq: Once | ORAL | Status: DC

## 2021-08-19 NOTE — Assessment & Plan Note (Signed)
chronic stable ?

## 2021-08-19 NOTE — Assessment & Plan Note (Signed)
In the setting of dehydration ?Check urine electrolytes ? ?rehydrate hold lisinopril ?

## 2021-08-19 NOTE — Assessment & Plan Note (Addendum)
Hold insulin long lasting given hypoglycemia ? ? -  check TSH and HgA1C ?  ? ? ?

## 2021-08-19 NOTE — Assessment & Plan Note (Signed)
Chronic stable continue statin resume beta-blocker if blood pressure allows ?

## 2021-08-19 NOTE — Assessment & Plan Note (Signed)
-   currently appears to be slightly on the dry side, hold home diuretics for tonight and restart when appears euvolemic, carefuly follow fluid status and Cr  

## 2021-08-19 NOTE — ED Triage Notes (Signed)
Per EMS, patient from home, c/o dizziness and diarrhea x4 days. +orthostatics. Ambulatory. ? ?BP 176/84 ?HR 58 ?RR 16 ?100% RA ?T 98.1 ?CBG 110 ?

## 2021-08-19 NOTE — ED Notes (Signed)
Pt was unable to walk once he stood up he stated  to tech "I need to sit back down im getting dizzy". RN and MD were notified  ?

## 2021-08-19 NOTE — ED Provider Notes (Signed)
?[facility] DEPT ?Provider Note ? ? ?CSN: [id number] ?Arrival date & time: 08/19/21  1414 ? ?  ? ?History ? ?Chief Complaint  ?Patient presents with  ? Dizziness  ? ? ?Jesse Lane is a 66 y.o. male. ? ?Pt is a 66 yo male with a pmhx significant for metastatic melanoma, htn, dm2, arthritis, gerd, and chf.  Pt said he has not been feeling well all week.  He developed diarrhea on 5/8.  He took Imodium, so that is better.  He has abd swelling and pain.  He had melanoma which metastasized to his abdomen and is concerned this is happening again.  Pt denies any f/c.  No vomiting.  He's been dizzy today and was orthostatic for EMS. ? ? ?  ? ?Home Medications ?Prior to Admission medications   ?Medication Sig Start Date End Date Taking? Authorizing Provider  ?acetaminophen (TYLENOL) 500 MG tablet Take 1,000 mg by mouth every 6 (six) hours as needed for mild pain.    [provider]  ?alendronate (FOSAMAX) 70 MG tablet Take 70 mg by mouth once a week. Wednesday's 04/09/21   [provider]  ?amLODipine (NORVASC) 5 MG tablet Take 1 tablet (5 mg total) by mouth daily. 05/06/21   Aline August, MD  ?Brimonidine Tartrate (LUMIFY) 0.025 % SOLN Place 1 drop into both eyes 2 (two) times daily as needed (irritated/dry eyes).    [provider]  ?doxycycline (VIBRAMYCIN) 100 MG capsule Take 100 mg by mouth 2 (two) times daily. 05/29/21   [provider]  ?furosemide (LASIX) 40 MG tablet Take 40 mg by mouth. Take 1 Tablet Daily.    [provider]  ?insulin degludec (TRESIBA FLEXTOUCH) 100 UNIT/ML FlexTouch Pen Inject 25 Units into the skin daily before breakfast.    [provider]  ?insulin lispro (HUMALOG) 100 UNIT/ML KwikPen Inject 7-15 Units into the skin See admin instructions. 7 units in the morning, 7 units at lunch, and 8 units in the evening. May take additional units as needed, max of 50 units per day    [provider]  ?Insulin Pen  Needle (BD PEN NEEDLE MICRO U/F) 32G X 6 MM MISC 1 each by Other route See admin instructions. Use one pen needle to inject insulin 4 times daily. **must have appointment for additional refills.** 06/30/18   Renato Shin, MD  ?Insulin Pen Needle (BD PEN NEEDLE MICRO U/F) 32G X 6 MM MISC USE 1 PEN NEEDLE SIX TIMES DAILY ?Patient taking differently: 1 each by Other route See admin instructions. USE 1 PEN NEEDLE SIX TIMES DAILY 07/02/18   Renato Shin, MD  ?lisinopril (PRINIVIL,ZESTRIL) 40 MG tablet Take 40 mg by mouth at bedtime.  07/24/16   [provider]  ?MELATONIN PO Take 1 tablet by mouth at bedtime as needed (sleep).    [provider]  ?montelukast (SINGULAIR) 10 MG tablet Take 10 mg by mouth at bedtime. 10/13/19   [provider]  ?Multiple Vitamin (MULTIVITAMIN WITH MINERALS) TABS tablet Take 1 tablet by mouth daily.    [provider]  ?nebivolol (BYSTOLIC) 2.5 MG tablet Take 1 tablet (2.5 mg total) by mouth daily. 06/07/21   Warren Lacy, PA-C  ?ondansetron (ZOFRAN) 4 MG tablet Take 1 tablet (4 mg total) by mouth every 6 (six) hours as needed for nausea. 08/11/21   Levin Erp, PA  ?pantoprazole (PROTONIX) 40 MG tablet Take 1 tablet by mouth twice daily 06/27/21   Milus Banister,  MD  ?potassium chloride SA (KLOR-CON M) 20 MEQ tablet 20 mEq daily ?Patient taking differently: Take 20 mEq by mouth daily. 04/15/21   Shelda Pal, DO  ?pregabalin (LYRICA) 100 MG capsule Take 200-300 mg by mouth See admin instructions. Take 2 capsule (200 mg) by mouth every morning and take three capsules (300 mg) at bedtime 10/15/19   [provider]  ?rosuvastatin (CRESTOR) 20 MG tablet Take 1 tablet (20 mg total) by mouth daily. 06/21/21 09/19/21  Warren Lacy, PA-C  ?traZODone (DESYREL) 100 MG tablet Take 100 mg by mouth at bedtime. 07/24/16   [provider]  ?   ? ?Allergies    ?Contrast media [iodinated contrast media]   ? ?Review of Systems    ?Review of Systems  ?Gastrointestinal:  Positive for abdominal pain and diarrhea.  ?All other systems reviewed and are negative. ? ?Physical Exam ?Updated Vital Signs ?BP (!) 199/92   Pulse 63   Temp 98.3 ?F (36.8 ?C) (Oral)   Resp (!) 24   Wt 85 kg   SpO2 96%   BMI 26.14 kg/m?  ?Physical Exam ?Vitals and nursing note reviewed.  ?Constitutional:   ?   Appearance: Normal appearance.  ?HENT:  ?   Head: Normocephalic and atraumatic.  ?   Right Ear: External ear normal.  ?   Left Ear: External ear normal.  ?   Nose: Nose normal.  ?   Mouth/Throat:  ?   Mouth: Mucous membranes are dry.  ?Eyes:  ?   Extraocular Movements: Extraocular movements intact.  ?   Conjunctiva/sclera: Conjunctivae normal.  ?   Pupils: Pupils are equal, round, and reactive to light.  ?Cardiovascular:  ?   Rate and Rhythm: Normal rate and regular rhythm.  ?   Pulses: Normal pulses.  ?   Heart sounds: Normal heart sounds.  ?Pulmonary:  ?   Effort: Pulmonary effort is normal.  ?   Breath sounds: Normal breath sounds.  ?Abdominal:  ?   General: Abdomen is flat. Bowel sounds are normal. There is distension.  ?   Palpations: Abdomen is soft.  ?   Tenderness: There is generalized abdominal tenderness.  ?Musculoskeletal:     ?   General: Normal range of motion.  ?   Cervical back: Normal range of motion and neck supple.  ?Skin: ?   General: Skin is warm.  ?   Capillary Refill: Capillary refill takes less than 2 seconds.  ?Neurological:  ?   General: No focal deficit present.  ?   Mental Status: He is alert and oriented to person, place, and time.  ?Psychiatric:     ?   Mood and Affect: Mood normal.     ?   Behavior: Behavior normal.  ? ? ?ED Results / Procedures / Treatments   ?Labs ?(all labs ordered are listed, but only abnormal results are displayed) ?Labs Reviewed  ?BASIC METABOLIC PANEL - Abnormal; Notable for the following components:  ?    Result Value  ? Potassium 2.9 (*)   ? Glucose, Bld 172 (*)   ? BUN 32 (*)   ? Creatinine, Ser 2.35 (*)    ? GFR, Estimated 30 (*)   ? All other components within normal limits  ?CBC WITH DIFFERENTIAL/PLATELET - Abnormal; Notable for the following components:  ? RBC 3.76 (*)   ? Hemoglobin 11.9 (*)   ? HCT 32.5 (*)   ? MCHC 36.6 (*)   ? Platelets 145 (*)   ?  All other components within normal limits  ?URINALYSIS, ROUTINE W REFLEX MICROSCOPIC - Abnormal; Notable for the following components:  ? Protein, ur 100 (*)   ? Bacteria, UA RARE (*)   ? All other components within normal limits  ?CBG MONITORING, ED - Abnormal; Notable for the following components:  ? Glucose-Capillary 175 (*)   ? All other components within normal limits  ?TROPONIN I (HIGH SENSITIVITY) - Abnormal; Notable for the following components:  ? Troponin I (High Sensitivity) 20 (*)   ? All other components within normal limits  ?TROPONIN I (HIGH SENSITIVITY) - Abnormal; Notable for the following components:  ? Troponin I (High Sensitivity) 22 (*)   ? All other components within normal limits  ?BRAIN NATRIURETIC PEPTIDE  ?MAGNESIUM  ? ? ?EKG ?EKG Interpretation ? ?Date/Time:  Saturday Aug 19 2021 15:01:56 EDT ?Ventricular Rate:  58 ?PR Interval:  285 ?QRS Duration: 105 ?QT Interval:  485 ?QTC Calculation: 477 ?R Axis:   93 ?Text Interpretation: Sinus rhythm Prolonged PR interval Right axis deviation Minimal ST elevation, inferior leads Borderline prolonged QT interval No significant change since last tracing Confirmed by Isla Pence [phone]) on 08/19/2021 6:42:24 PM ? ?Radiology ?CT ABDOMEN PELVIS WO CONTRAST ? ?Result Date: 08/19/2021 ?CLINICAL DATA:  Abdominal pain EXAM: CT ABDOMEN AND PELVIS WITHOUT CONTRAST TECHNIQUE: Multidetector CT imaging of the abdomen and pelvis was performed following the standard protocol without IV contrast. RADIATION DOSE REDUCTION: This exam was performed according to the departmental dose-optimization program which includes automated exposure control, adjustment of the mA and/or kV according to patient size and/or use of  iterative reconstruction technique. COMPARISON:  10/06/2020 FINDINGS: Lower chest: No acute abnormality. Hepatobiliary: No focal hepatic abnormality. Gallbladder unremarkable. Pancreas: No focal abnormality or

## 2021-08-19 NOTE — ED Triage Notes (Signed)
Reports he feels like his cancer has returned in his intestines. Had diarrhea 4 days ago, took an Imodium and that helped it resolve. Hx of melanoma.  ?

## 2021-08-19 NOTE — Assessment & Plan Note (Signed)
-  chronic avoid nephrotoxic medications such as NSAIDs, Vanco Zosyn combo,  avoid hypotension, continue to follow renal function  

## 2021-08-19 NOTE — Assessment & Plan Note (Signed)
Continue ppi

## 2021-08-19 NOTE — Subjective & Objective (Signed)
Complains of lightheadedness and diarrhea for the past 4 days EMS was called.  Seem to be orthostatic ?Past history of melanoma. ?Patient has used Imodium for his diarrhea that seem to have helped ?He has been having some abdominal pain and swelling.  He has history of melanoma with metastases to his abdomen and concerned that there is some recurrence no associated vomiting ?Patient has history of chronic diastolic CHF and CAD takes Lasix ?

## 2021-08-19 NOTE — H&P (Addendum)
? ? ?Jesse Lane CVE:[id number] DOB: 01-Feb-1956 DOA: 08/19/2021 ? ? ?  ?PCP: [name], MD   ?Outpatient Specialists:   ?CARDS:  Dr. Sallyanne Kuster ?  ? Oncology   Dr.Sanders ?GI   [facility]  PA( [facility])  ?  ? ?Patient arrived to ER on 08/19/21 at 1414 ?Referred by Attending Isla Pence, MD ? ? ?Patient coming from:   ? home Lives  With SO ?  ? ?Chief Complaint:   ?Chief Complaint  ?Patient presents with  ? Dizziness  ? ? ?HPI: ?Jesse Lane is a 66 y.o. male with medical history significant of chronic diastolic CHF, CAD, HTN HLD, history of melanoma with metastatic spread to the abdomen, diabetes mellitus type 2 ?  ? ?Presented with  lightheaded ?Complains of lightheadedness and diarrhea for the past 4 days EMS was called.  Seem to be orthostatic ?Past history of melanoma. ?Patient has used Imodium for his diarrhea that seem to have helped ?He has been having some abdominal pain and swelling.  He has history of melanoma with metastases to his abdomen and concerned that there is some recurrence no associated vomiting ?Patient has history of chronic diastolic CHF and CAD takes Lasix ?  No sick contacts states he was driving to the beach with his GF when started to feel sick it lasted for about 4 days, his GF did not get sick  ?Does not think he ate anything that could have caused this ?No tobacco occasional etoh nothing since last week ?He used to drink more heavy not any more ?He does smoke marijuana few time a day ?  ?  ?Regarding pertinent Chronic problems:   ? ? Hyperlipidemia - on statins Crestor ?Lipid Panel  ?   ?Component Value Date/Time  ? CHOL 147 06/16/2021 0837  ? TRIG 288 (H) 06/16/2021 0837  ? HDL 27 (L) 06/16/2021 0837  ? CHOLHDL 5.4 (H) 06/16/2021 0258  ? CHOLHDL 6.5 10/17/2019 0255  ? VLDL 37 10/17/2019 0255  ? [place] 73 06/16/2021 0837  ? LABVLDL 47 (H) 06/16/2021 5277  ?GERD on PPI ? ? HTN on Norvasc, lisinopril, Bystolic ? ? chronic CHF diastolic/systolic/ combined - last echo January 2023  preserved EF 60-65% indeterminate diastolic parameters ?Lasix ? ?  CAD  - On Aspirin, statin, betablocker,   ?               -  followed by cardiology ?             ?CT angiogram showed significant stenosis of the mid right coronary artery and widespread coronary arthrosclerosis since there is no symptoms cardiac catheterization was deferred ? ?  DM 2 -  ?Lab Results  ?Component Value Date  ? HGBA1C 5.4 04/13/2021  ? on insulin,  ?   ?  ? CKD stage IIIb- baseline Cr 2.0 ?Estimated Creatinine Clearance: 32.9 mL/min (A) (by C-G formula based on SCr of 2.35 mg/dL (H)). ? ?Lab Results  ?Component Value Date  ? CREATININE 2.35 (H) 08/19/2021  ? CREATININE 1.73 (H) 07/13/2021  ? CREATININE 2.32 (H) 06/07/2021  ? ?  Chronic anemia - baseline hg Hemoglobin & Hematocrit  ?Recent Labs  ?  05/03/21 ?2013 05/04/21 ?0415 08/19/21 ?1508  ?HGB 11.6* 11.3* 11.9*  ?  ?While in ER: ?  ? ?Noted to have potassium down to 2.9 creatinine up from baseline up to 2.35 ?CT abdomen nonacute ? ?CXR -  NON acute ? ?CTabd/pelvis -  nonacute ? ?   ?  Following Medications were ordered in ER: ?Medications  ?potassium chloride 10 mEq in 100 mL IVPB (0 mEq Intravenous Stopped 08/19/21 2103)  ?sodium chloride 0.9 % bolus 1,000 mL (0 mLs Intravenous Stopped 08/19/21 2103)  ?potassium chloride SA (KLOR-CON M) CR tablet 40 mEq (40 mEq Oral Given 08/19/21 1958)  ?sodium chloride 0.9 % bolus 1,000 mL (1,000 mLs Intravenous New Bag/Given 08/19/21 2217)  ?   ?  ?ED Triage Vitals  ?Enc Vitals Group  ?   BP 08/19/21 1434 (!) 156/85  ?   Pulse Rate 08/19/21 1434 (!) 57  ?   Resp 08/19/21 1434 18  ?   Temp 08/19/21 1434 98.3 ?F (36.8 ?C)  ?   Temp Source 08/19/21 1434 Oral  ?   SpO2 08/19/21 1434 100 %  ?   Weight 08/19/21 1435 187 lb 6.3 oz (85 kg)  ?   Height --   ?   Head Circumference --   ?   Peak Flow --   ?   Pain Score 08/19/21 1435 0  ?   Pain Loc --   ?   Pain Edu? --   ?   Excl. in [place]? --   ?QIHK(74)@    ?  _________________________________________ ?Significant initial  Findings: ?Abnormal Labs Reviewed  ?BASIC METABOLIC PANEL - Abnormal; Notable for the following components:  ?    Result Value  ? Potassium 2.9 (*)   ? Glucose, Bld 172 (*)   ? BUN 32 (*)   ? Creatinine, Ser 2.35 (*)   ? GFR, Estimated 30 (*)   ? All other components within normal limits  ?CBC WITH DIFFERENTIAL/PLATELET - Abnormal; Notable for the following components:  ? RBC 3.76 (*)   ? Hemoglobin 11.9 (*)   ? HCT 32.5 (*)   ? MCHC 36.6 (*)   ? Platelets 145 (*)   ? All other components within normal limits  ?URINALYSIS, ROUTINE W REFLEX MICROSCOPIC - Abnormal; Notable for the following components:  ? Protein, ur 100 (*)   ? Bacteria, UA RARE (*)   ? All other components within normal limits  ?CBG MONITORING, ED - Abnormal; Notable for the following components:  ? Glucose-Capillary 175 (*)   ? All other components within normal limits  ?TROPONIN I (HIGH SENSITIVITY) - Abnormal; Notable for the following components:  ? Troponin I (High Sensitivity) 20 (*)   ? All other components within normal limits  ?TROPONIN I (HIGH SENSITIVITY) - Abnormal; Notable for the following components:  ? Troponin I (High Sensitivity) 22 (*)   ? All other components within normal limits  ? ?  ?_________________________ ?Troponin 22-20 ?ECG: Ordered ?Personally reviewed by me showing: ?HR : 58 ?Rhythm:  Sinus rhythm Prolonged PR interval Right axis deviation Minimal ST elevation, inferior leads Borderline prolonged QT interval No significant change since last tracing ?QTC 477 ?  ?The recent clinical data is shown below. ?Vitals:  ? 08/19/21 2000 08/19/21 2030 08/19/21 2100 08/19/21 2130  ?BP: (!) 193/76 (!) 186/75 (!) 167/95 (!) 199/92  ?Pulse: 60 (!) 57 60 63  ?Resp: '11 17 19 '$ (!) 24  ?Temp:      ?TempSrc:      ?SpO2: 100% 99% 97% 96%  ?Weight:      ?  ?  ?WBC ? ?   ?Component Value Date/Time  ? WBC 4.5 08/19/2021 1508  ? LYMPHSABS 1.5 08/19/2021 1508  ? MONOABS 0.3  08/19/2021 1508  ? EOSABS 0.0 08/19/2021 1508  ? BASOSABS  0.0 08/19/2021 1508  ?  ? ? UA   no evidence of UTI    ?  ?Urine analysis: ?   ?Component Value Date/Time  ? [place] YELLOW 08/19/2021 2013  ? APPEARANCEUR CLEAR 08/19/2021 2013  ? LABSPEC 1.012 08/19/2021 2013  ? PHURINE 6.0 08/19/2021 2013  ? [place] NEGATIVE 08/19/2021 2013  ? [place] NEGATIVE 08/19/2021 2013  ? [place] NEGATIVE 08/19/2021 2013  ? [place] NEGATIVE 08/19/2021 2013  ? PROTEINUR 100 (A) 08/19/2021 2013  ? UROBILINOGEN 0.2 12/05/2011 1655  ? NITRITE NEGATIVE 08/19/2021 2013  ? LEUKOCYTESUR NEGATIVE 08/19/2021 2013  ?  ?_______________________________________________ ?Hospitalist was called for admission for   Dehydration  ?AKI (acute kidney injury) ([place]) hypokalemia ?   ?The following Work up has been ordered so far: ? ?Orders Placed This Encounter  ?Procedures  ? DG Chest 2 View  ? CT ABDOMEN PELVIS WO CONTRAST  ? Basic metabolic panel  ? CBC WITH DIFFERENTIAL  ? Urinalysis, Routine w reflex microscopic Urine, Clean Catch  ? Brain natriuretic peptide  ? Magnesium  ? Cardiac monitoring  ? Orthostatic vital signs  ? Ambulate patient  ? Consult to hospitalist  ? CBG monitoring, ED  ? EKG 12-Lead  ? Insert peripheral IV  ?  ? ?OTHER Significant initial  Findings: ? ?labs showing: ? ?  ?Recent Labs  ?Lab 08/19/21 ?1508 08/19/21 ?1850  ?NA 139  --   ?K 2.9*  --   ?CO2 24  --   ?GLUCOSE 172*  --   ?BUN 32*  --   ?CREATININE 2.35*  --   ?CALCIUM 9.1  --   ?MG  --  1.9  ? ? ?Cr    Up from baseline see below ?Lab Results  ?Component Value Date  ? CREATININE 2.35 (H) 08/19/2021  ? CREATININE 1.73 (H) 07/13/2021  ? CREATININE 2.32 (H) 06/07/2021  ? ? ?No results for input(s): AST, ALT, ALKPHOS, BILITOT, PROT, ALBUMIN in the last 168 hours. ?Lab Results  ?Component Value Date  ? CALCIUM 9.1 08/19/2021  ? PHOS 4.2 03/10/2021  ?  ?   ?Plt: ?Lab Results  ?Component Value Date  ? PLT 145 (L) 08/19/2021  ?  ?  ?COVID-19 Labs ? ?No results for input(s):  DDIMER, FERRITIN, LDH, CRP in the last 72 hours. ? ?Lab Results  ?Component Value Date  ? [place] NEGATIVE 05/03/2021  ? [place] NEGATIVE 04/12/2021  ? [place] NEGATIVE 03/08/2021  ? SARSCOV2NAA POSITIVE (A) 01/23/2021  ? ?  ? ?   ?Recent Labs  ?[place]

## 2021-08-19 NOTE — Assessment & Plan Note (Signed)
-   will replace and repeat in AM,  check magnesium level and replace as needed ° °

## 2021-08-19 NOTE — ED Provider Triage Note (Signed)
Emergency Medicine Provider Triage Evaluation Note ? ?Jesse Lane , a 66 y.o. male  was evaluated in triage.  Pt complains of dizziness and abdominal discomfort.  States that over the past week he has had some nausea and increased bloating sensation is in abdomen.  He states that his last real bowel movement was on Monday.  He had diarrhea one time 4 days ago and it has not returned.  He is concerned because he has a history of melanoma and at one point it had metastasized into his intestines.  He was treated for this and was cleared from a cancer standpoint.  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 ? ?Also states that today when he was standing up and started ambulating, he started having worsening dizziness.  He states that he notices this mostly from sitting to standing position.  He states that it feels like he is going to pass out.  ? ?Review of Systems  ?Positive:  ?Negative:  ? ?Physical Exam  ?BP (!) 156/85 (BP Location: Right Arm)   Pulse (!) 57   Temp 98.3 ?F (36.8 ?C) (Oral)   Resp 18   Wt 85 kg   SpO2 100%   BMI 26.14 kg/m?  ?Gen:   Awake, no distress   ?Resp:  Normal effort  ?MSK:   Moves extremities without difficulty  ?Other:  Abdomen is mildly distended.  No tenderness to palpation.   ? ?Medical Decision Making  ?Medically screening exam initiated at 2:47 PM.  Appropriate orders placed.  Jesse Lane was informed that the remainder of the evaluation will be completed by another provider, this initial triage assessment does not replace that evaluation, and the importance of remaining in the ED until their evaluation is complete. ? ? ?  ?Adolphus Birchwood, PA-C ?08/19/21 1450 ? ?

## 2021-08-19 NOTE — Assessment & Plan Note (Signed)
Rehydrate continue fluid status check orthostatics prior to home-going home hold Lasix  ?

## 2021-08-19 NOTE — Assessment & Plan Note (Signed)
Stable, accelerated will restart home meds ?

## 2021-08-19 NOTE — Assessment & Plan Note (Signed)
Currently resolved. °

## 2021-08-20 ENCOUNTER — Encounter (HOSPITAL_COMMUNITY): Payer: Self-pay | Admitting: Internal Medicine

## 2021-08-20 DIAGNOSIS — Z800 Family history of malignant neoplasm of digestive organs: Secondary | ICD-10-CM | POA: Diagnosis not present

## 2021-08-20 DIAGNOSIS — R42 Dizziness and giddiness: Secondary | ICD-10-CM | POA: Diagnosis present

## 2021-08-20 DIAGNOSIS — E11649 Type 2 diabetes mellitus with hypoglycemia without coma: Secondary | ICD-10-CM

## 2021-08-20 DIAGNOSIS — Z803 Family history of malignant neoplasm of breast: Secondary | ICD-10-CM | POA: Diagnosis not present

## 2021-08-20 DIAGNOSIS — I130 Hypertensive heart and chronic kidney disease with heart failure and stage 1 through stage 4 chronic kidney disease, or unspecified chronic kidney disease: Secondary | ICD-10-CM | POA: Diagnosis present

## 2021-08-20 DIAGNOSIS — E1165 Type 2 diabetes mellitus with hyperglycemia: Secondary | ICD-10-CM | POA: Diagnosis present

## 2021-08-20 DIAGNOSIS — E860 Dehydration: Secondary | ICD-10-CM | POA: Diagnosis present

## 2021-08-20 DIAGNOSIS — R197 Diarrhea, unspecified: Secondary | ICD-10-CM | POA: Diagnosis not present

## 2021-08-20 DIAGNOSIS — N179 Acute kidney failure, unspecified: Secondary | ICD-10-CM | POA: Diagnosis not present

## 2021-08-20 DIAGNOSIS — E785 Hyperlipidemia, unspecified: Secondary | ICD-10-CM | POA: Diagnosis present

## 2021-08-20 DIAGNOSIS — E876 Hypokalemia: Secondary | ICD-10-CM | POA: Diagnosis present

## 2021-08-20 DIAGNOSIS — C439 Malignant melanoma of skin, unspecified: Secondary | ICD-10-CM | POA: Diagnosis present

## 2021-08-20 DIAGNOSIS — Z8042 Family history of malignant neoplasm of prostate: Secondary | ICD-10-CM | POA: Diagnosis not present

## 2021-08-20 DIAGNOSIS — E1122 Type 2 diabetes mellitus with diabetic chronic kidney disease: Secondary | ICD-10-CM | POA: Diagnosis present

## 2021-08-20 DIAGNOSIS — C7989 Secondary malignant neoplasm of other specified sites: Secondary | ICD-10-CM | POA: Diagnosis present

## 2021-08-20 DIAGNOSIS — N1832 Chronic kidney disease, stage 3b: Secondary | ICD-10-CM | POA: Diagnosis present

## 2021-08-20 DIAGNOSIS — Z808 Family history of malignant neoplasm of other organs or systems: Secondary | ICD-10-CM | POA: Diagnosis not present

## 2021-08-20 DIAGNOSIS — Z85820 Personal history of malignant melanoma of skin: Secondary | ICD-10-CM | POA: Diagnosis not present

## 2021-08-20 DIAGNOSIS — K219 Gastro-esophageal reflux disease without esophagitis: Secondary | ICD-10-CM | POA: Diagnosis present

## 2021-08-20 DIAGNOSIS — Z79899 Other long term (current) drug therapy: Secondary | ICD-10-CM | POA: Diagnosis not present

## 2021-08-20 DIAGNOSIS — D638 Anemia in other chronic diseases classified elsewhere: Secondary | ICD-10-CM | POA: Diagnosis present

## 2021-08-20 DIAGNOSIS — E1140 Type 2 diabetes mellitus with diabetic neuropathy, unspecified: Secondary | ICD-10-CM | POA: Diagnosis present

## 2021-08-20 DIAGNOSIS — I5042 Chronic combined systolic (congestive) and diastolic (congestive) heart failure: Secondary | ICD-10-CM | POA: Diagnosis present

## 2021-08-20 DIAGNOSIS — I951 Orthostatic hypotension: Secondary | ICD-10-CM

## 2021-08-20 DIAGNOSIS — Z87891 Personal history of nicotine dependence: Secondary | ICD-10-CM | POA: Diagnosis not present

## 2021-08-20 DIAGNOSIS — Z8711 Personal history of peptic ulcer disease: Secondary | ICD-10-CM | POA: Diagnosis not present

## 2021-08-20 DIAGNOSIS — I2510 Atherosclerotic heart disease of native coronary artery without angina pectoris: Secondary | ICD-10-CM | POA: Diagnosis not present

## 2021-08-20 LAB — CBG MONITORING, ED
Glucose-Capillary: 187 mg/dL — ABNORMAL HIGH (ref 70–99)
Glucose-Capillary: 56 mg/dL — ABNORMAL LOW (ref 70–99)
Glucose-Capillary: 57 mg/dL — ABNORMAL LOW (ref 70–99)

## 2021-08-20 LAB — GLUCOSE, CAPILLARY
Glucose-Capillary: 210 mg/dL — ABNORMAL HIGH (ref 70–99)
Glucose-Capillary: 218 mg/dL — ABNORMAL HIGH (ref 70–99)
Glucose-Capillary: 234 mg/dL — ABNORMAL HIGH (ref 70–99)
Glucose-Capillary: 246 mg/dL — ABNORMAL HIGH (ref 70–99)
Glucose-Capillary: 258 mg/dL — ABNORMAL HIGH (ref 70–99)
Glucose-Capillary: 325 mg/dL — ABNORMAL HIGH (ref 70–99)

## 2021-08-20 LAB — PREALBUMIN: Prealbumin: 24.2 mg/dL (ref 18–38)

## 2021-08-20 LAB — BASIC METABOLIC PANEL
Anion gap: 7 (ref 5–15)
BUN: 22 mg/dL (ref 8–23)
CO2: 23 mmol/L (ref 22–32)
Calcium: 8.6 mg/dL — ABNORMAL LOW (ref 8.9–10.3)
Chloride: 108 mmol/L (ref 98–111)
Creatinine, Ser: 1.68 mg/dL — ABNORMAL HIGH (ref 0.61–1.24)
GFR, Estimated: 45 mL/min — ABNORMAL LOW (ref >60)
Glucose, Bld: 261 mg/dL — ABNORMAL HIGH (ref 70–99)
Potassium: 3.7 mmol/L (ref 3.5–5.1)
Sodium: 138 mmol/L (ref 135–145)

## 2021-08-20 LAB — CBC
HCT: 29.5 % — ABNORMAL LOW (ref 39.0–52.0)
Hemoglobin: 10.7 g/dL — ABNORMAL LOW (ref 13.0–17.0)
MCH: 31.8 pg (ref 26.0–34.0)
MCHC: 36.3 g/dL — ABNORMAL HIGH (ref 30.0–36.0)
MCV: 87.5 fL (ref 80.0–100.0)
Platelets: 137 10*3/uL — ABNORMAL LOW (ref 150–400)
RBC: 3.37 MIL/uL — ABNORMAL LOW (ref 4.22–5.81)
RDW: 13.3 % (ref 11.5–15.5)
WBC: 4.5 10*3/uL (ref 4.0–10.5)
nRBC: 0 % (ref 0.0–0.2)

## 2021-08-20 LAB — HEPATIC FUNCTION PANEL
ALT: 15 U/L (ref 0–44)
AST: 18 U/L (ref 15–41)
Albumin: 3.9 g/dL (ref 3.5–5.0)
Alkaline Phosphatase: 62 U/L (ref 38–126)
Bilirubin, Direct: 0.2 mg/dL (ref 0.0–0.2)
Indirect Bilirubin: 1.7 mg/dL — ABNORMAL HIGH (ref 0.3–0.9)
Total Bilirubin: 1.9 mg/dL — ABNORMAL HIGH (ref 0.3–1.2)
Total Protein: 6.8 g/dL (ref 6.5–8.1)

## 2021-08-20 LAB — COMPREHENSIVE METABOLIC PANEL
ALT: 15 U/L (ref 0–44)
AST: 17 U/L (ref 15–41)
Albumin: 3.8 g/dL (ref 3.5–5.0)
Alkaline Phosphatase: 62 U/L (ref 38–126)
Anion gap: 8 (ref 5–15)
BUN: 28 mg/dL — ABNORMAL HIGH (ref 8–23)
CO2: 24 mmol/L (ref 22–32)
Calcium: 8.2 mg/dL — ABNORMAL LOW (ref 8.9–10.3)
Chloride: 109 mmol/L (ref 98–111)
Creatinine, Ser: 2.06 mg/dL — ABNORMAL HIGH (ref 0.61–1.24)
GFR, Estimated: 35 mL/min — ABNORMAL LOW (ref >60)
Glucose, Bld: 223 mg/dL — ABNORMAL HIGH (ref 70–99)
Potassium: 3.1 mmol/L — ABNORMAL LOW (ref 3.5–5.1)
Sodium: 141 mmol/L (ref 135–145)
Total Bilirubin: 2.1 mg/dL — ABNORMAL HIGH (ref 0.3–1.2)
Total Protein: 6.5 g/dL (ref 6.5–8.1)

## 2021-08-20 LAB — CK: Total CK: 173 U/L (ref 49–397)

## 2021-08-20 LAB — OSMOLALITY, URINE: Osmolality, Ur: 377 mOsm/kg (ref 300–900)

## 2021-08-20 LAB — OSMOLALITY: Osmolality: 303 mOsm/kg — ABNORMAL HIGH (ref 275–295)

## 2021-08-20 LAB — TSH: TSH: 1.145 u[IU]/mL (ref 0.350–4.500)

## 2021-08-20 LAB — PHOSPHORUS: Phosphorus: 3.4 mg/dL (ref 2.5–4.6)

## 2021-08-20 MED ORDER — INSULIN GLARGINE-YFGN 100 UNIT/ML ~~LOC~~ SOLN
10.0000 [IU] | Freq: Every day | SUBCUTANEOUS | Status: DC
  Administered 2021-08-20: 10 [IU] via SUBCUTANEOUS
  Filled 2021-08-20: qty 0.1

## 2021-08-20 MED ORDER — MONTELUKAST SODIUM 10 MG PO TABS
10.0000 mg | ORAL_TABLET | Freq: Every day | ORAL | Status: DC
  Administered 2021-08-20 (×2): 10 mg via ORAL
  Filled 2021-08-20 (×2): qty 1

## 2021-08-20 MED ORDER — PREGABALIN 50 MG PO CAPS
300.0000 mg | ORAL_CAPSULE | Freq: Every day | ORAL | Status: DC
  Administered 2021-08-20: 300 mg via ORAL
  Filled 2021-08-20: qty 6

## 2021-08-20 MED ORDER — ACETAMINOPHEN 650 MG RE SUPP: 650.0000 mg | Freq: Four times a day (QID) | RECTAL | Status: DC | PRN

## 2021-08-20 MED ORDER — ENSURE ENLIVE PO LIQD
237.0000 mL | Freq: Two times a day (BID) | ORAL | Status: DC
  Administered 2021-08-20: 237 mL via ORAL

## 2021-08-20 MED ORDER — INSULIN DEGLUDEC 100 UNIT/ML ~~LOC~~ SOPN: 10.0000 [IU] | PEN_INJECTOR | Freq: Every day | SUBCUTANEOUS | Status: DC

## 2021-08-20 MED ORDER — SODIUM CHLORIDE 0.9 % IV SOLN: INTRAVENOUS | Status: DC

## 2021-08-20 MED ORDER — HYDROCODONE-ACETAMINOPHEN 5-325 MG PO TABS: 1.0000 | ORAL_TABLET | ORAL | Status: DC | PRN

## 2021-08-20 MED ORDER — NEBIVOLOL HCL 2.5 MG PO TABS
2.5000 mg | ORAL_TABLET | Freq: Every day | ORAL | Status: DC
  Administered 2021-08-20: 2.5 mg via ORAL
  Filled 2021-08-20 (×3): qty 1

## 2021-08-20 MED ORDER — INSULIN GLARGINE-YFGN 100 UNIT/ML ~~LOC~~ SOLN
5.0000 [IU] | Freq: Two times a day (BID) | SUBCUTANEOUS | Status: DC
  Administered 2021-08-20 – 2021-08-21 (×2): 5 [IU] via SUBCUTANEOUS
  Filled 2021-08-20 (×3): qty 0.05

## 2021-08-20 MED ORDER — INSULIN ASPART 100 UNIT/ML IJ SOLN
3.0000 [IU] | Freq: Three times a day (TID) | INTRAMUSCULAR | Status: DC
  Administered 2021-08-20 – 2021-08-21 (×3): 3 [IU] via SUBCUTANEOUS

## 2021-08-20 MED ORDER — AMLODIPINE BESYLATE 5 MG PO TABS
5.0000 mg | ORAL_TABLET | Freq: Every day | ORAL | Status: DC
  Administered 2021-08-20: 5 mg via ORAL
  Filled 2021-08-20: qty 1

## 2021-08-20 MED ORDER — ROSUVASTATIN CALCIUM 20 MG PO TABS
20.0000 mg | ORAL_TABLET | Freq: Every day | ORAL | Status: DC
  Administered 2021-08-20: 20 mg via ORAL
  Filled 2021-08-20: qty 1

## 2021-08-20 MED ORDER — SODIUM CHLORIDE 0.9 % IV SOLN
75.0000 mL/h | INTRAVENOUS | Status: AC
  Administered 2021-08-20: 75 mL/h via INTRAVENOUS

## 2021-08-20 MED ORDER — ONDANSETRON HCL 4 MG/2ML IJ SOLN
4.0000 mg | Freq: Four times a day (QID) | INTRAMUSCULAR | Status: DC | PRN
  Administered 2021-08-20: 4 mg via INTRAVENOUS
  Filled 2021-08-20: qty 2

## 2021-08-20 MED ORDER — PREGABALIN 75 MG PO CAPS
200.0000 mg | ORAL_CAPSULE | Freq: Every day | ORAL | Status: DC
  Administered 2021-08-20 – 2021-08-21 (×2): 200 mg via ORAL
  Filled 2021-08-20 (×2): qty 1

## 2021-08-20 MED ORDER — INSULIN ASPART 100 UNIT/ML IJ SOLN: 0.0000 [IU] | Freq: Every day | INTRAMUSCULAR | Status: DC

## 2021-08-20 MED ORDER — ACETAMINOPHEN 325 MG PO TABS: 650.0000 mg | ORAL_TABLET | Freq: Four times a day (QID) | ORAL | Status: DC | PRN

## 2021-08-20 MED ORDER — POTASSIUM CHLORIDE CRYS ER 20 MEQ PO TBCR
40.0000 meq | EXTENDED_RELEASE_TABLET | ORAL | Status: AC
  Administered 2021-08-20 (×2): 40 meq via ORAL
  Filled 2021-08-20 (×2): qty 2

## 2021-08-20 MED ORDER — INSULIN ASPART 100 UNIT/ML IJ SOLN
0.0000 [IU] | Freq: Three times a day (TID) | INTRAMUSCULAR | Status: DC
  Administered 2021-08-20: 3 [IU] via SUBCUTANEOUS
  Administered 2021-08-20: 5 [IU] via SUBCUTANEOUS
  Administered 2021-08-20: 3 [IU] via SUBCUTANEOUS
  Administered 2021-08-21: 5 [IU] via SUBCUTANEOUS
  Administered 2021-08-21: 7 [IU] via SUBCUTANEOUS

## 2021-08-20 MED ORDER — DEXTROSE 50 % IV SOLN
12.5000 g | Freq: Once | INTRAVENOUS | Status: AC
Start: 2021-08-20 — End: 2021-08-20
  Administered 2021-08-20: 12.5 g via INTRAVENOUS
  Filled 2021-08-20: qty 50

## 2021-08-20 NOTE — Evaluation (Signed)
Occupational Therapy Evaluation ?Patient Details ?Name: Jesse Lane ?MRN: [id number] ?DOB: 1955/08/07 ?Today's Date: 08/20/2021 ? ? ?History of Present Illness Patient is a 66 year old male who presented to the hospital with 4 day history of diarrhea and lightheadedness. patient was found to be orthostatic, AKI,dehydration and hypokalemia  PMH: HTN, stomach ulcers, diabetic neuropathy, melignant melanoma of axilla.  ? ?Clinical Impression ?  ?Patient is a 66 year old male who was noted to have orthostatic hypotension with movements today. Patient was noted to have decreased safety awareness, decreased endurance, decreased standing balance, and increased dizziness impacting participation in ADLs. Patient would continue to benefit from skilled OT services at this time while admitted and after d/c to address noted deficits in order to improve overall safety and independence in ADLs.  ? ? ?Blood pressures:  ?Supine  ?195/86 mmhg pulse 64 bpm 1st attempt  ?190/65mhg 64 bpm (117 MAP)  ? ?Sitting EOB  ?Blurry eyesight in the middle of eyeline bilaterally  ?152/88 mmhg (106 MAP) pulse rate 64 bpm  ? ?Standing  ?125/81 (94 MAP) HR 64 bpm  ? ?Standing 3 mins  ?118/734mg (89 MAP) 64 bpm   ?   ? ?Recommendations for follow up therapy are one component of a multi-disciplinary discharge planning process, led by the attending physician.  Recommendations may be updated based on patient status, additional functional criteria and insurance authorization.  ? ?Follow Up Recommendations ? No OT follow up  ?  ?Assistance Recommended at Discharge Frequent or constant Supervision/Assistance  ?Patient can return home with the following A little help with walking and/or transfers;A little help with bathing/dressing/bathroom;Assistance with cooking/housework;Direct supervision/assist for financial management;Assist for transportation;Help with stairs or ramp for entrance;Direct supervision/assist for medications management ? ?   ?Functional Status Assessment ? Patient has had a recent decline in their functional status and demonstrates the ability to make significant improvements in function in a reasonable and predictable amount of time.  ?Equipment Recommendations ? None recommended by OT  ?  ?Recommendations for Other Services   ? ? ?  ?Precautions / Restrictions Precautions ?Precautions: Fall ?Precaution Comments: orthostatic ?Restrictions ?Weight Bearing Restrictions: No  ? ?  ? ?Mobility Bed Mobility ?Overal bed mobility: Needs Assistance ?Bed Mobility: Supine to Sit ?  ?  ?Supine to sit: Min guard ?  ?  ?  ?  ? ?Transfers ?  ?  ?  ?  ?  ?  ?  ?  ?  ?  ?  ? ?  ?Balance Overall balance assessment: Mild deficits observed, not formally tested ?  ?  ?  ?  ?  ?  ?  ?  ?  ?  ?  ?  ?  ?  ?  ?  ?  ?  ?   ? ?ADL either performed or assessed with clinical judgement  ? ?ADL Overall ADL's : Needs assistance/impaired ?Eating/Feeding: Set up;Sitting ?  ?Grooming: Set up;Sitting ?  ?Upper Body Bathing: Set up;Sitting ?  ?Lower Body Bathing: Minimal assistance;Sit to/from stand ?  ?Upper Body Dressing : Set up;Sitting ?  ?Lower Body Dressing: Minimal assistance;Sit to/from stand ?  ?  ?Toilet Transfer Details (indicate cue type and reason): patient was in A to transfer with RW with increased time with increased dizziness reported with time standing. patient noted to have orthostatic vitals see above. ?Toileting- Clothing Manipulation and Hygiene: Minimal assistance;Sit to/from stand ?  ?  ?  ?  ?General ADL Comments: patients orthostatic response to  movement was patients limiting factor at this time  ? ? ? ?Vision Patient Visual Report: No change from baseline ?Additional Comments: with onset of low BP patietn reported blurriness in middle of vision that stayed consistent with movement. patient reported planning to go see MD about this after d/c from hospital.  ?   ?Perception   ?  ?Praxis   ?  ? ?Pertinent Vitals/Pain Pain Assessment ?Pain  Assessment: No/denies pain  ? ? ? ?Hand Dominance   ?  ?Extremity/Trunk Assessment Upper Extremity Assessment ?Upper Extremity Assessment: Overall WFL for tasks assessed ?  ?Lower Extremity Assessment ?Lower Extremity Assessment: Defer to PT evaluation ?  ?Cervical / Trunk Assessment ?Cervical / Trunk Assessment: Normal ?  ?Communication   ?  ?Cognition Arousal/Alertness: Awake/alert ?Behavior During Therapy: [facility] for tasks assessed/performed ?Overall Cognitive Status: Within Functional Limits for tasks assessed ?  ?  ?  ?  ?  ?  ?  ?  ?  ?  ?  ?  ?  ?  ?  ?  ?  ?  ?  ?General Comments    ? ?  ?Exercises   ?  ?Shoulder Instructions    ? ? ?Home Living   ?  ?  ?  ?  ?  ?  ?  ?  ?  ?  ?  ?  ?  ?  ?  ?  ?  ?  ? ?  ?Prior Functioning/Environment   ?  ?  ?  ?  ?  ?  ?  ?  ?  ? ?  ?  ?OT Problem List: Impaired balance (sitting and/or standing);Decreased activity tolerance;Decreased safety awareness;Cardiopulmonary status limiting activity;Decreased knowledge of use of DME or AE;Decreased knowledge of precautions ?  ?   ?OT Treatment/Interventions: Self-care/ADL training;Therapeutic exercise;Neuromuscular education;Energy conservation;DME and/or AE instruction;Therapeutic activities;Balance training;Patient/family education  ?  ?OT Goals(Current goals can be found in the care plan section) Acute Rehab OT Goals ?Patient Stated Goal: to get dizziness under control ?OT Goal Formulation: With patient ?Time For Goal Achievement: 09/03/21 ?Potential to Achieve Goals: Good  ?OT Frequency: Min 2X/week ?  ? ?Co-evaluation PT/OT/SLP Co-Evaluation/Treatment: Yes ?Reason for Co-Treatment: To address functional/ADL transfers;For patient/therapist safety ?PT goals addressed during session: Mobility/safety with mobility ?OT goals addressed during session: ADL's and self-care ?  ? ?  ?AM-PAC OT "6 Clicks" Daily Activity     ?Outcome Measure Help from another person eating meals?: None ?Help from another person taking care of personal  grooming?: None ?Help from another person toileting, which includes using toliet, bedpan, or urinal?: A Little ?Help from another person bathing (including washing, rinsing, drying)?: A Little ?Help from another person to put on and taking off regular upper body clothing?: A Little ?Help from another person to put on and taking off regular lower body clothing?: A Little ?6 Click Score: 20 ?  ?End of Session Equipment Utilized During Treatment: Gait belt;Rolling walker (2 wheels) ?Nurse Communication: Other (comment) (orthostatic reponse to movement and request for zofran) ? ?Activity Tolerance: Patient tolerated treatment well ?Patient left: in chair;with call bell/phone within reach;with chair alarm set ? ?OT Visit Diagnosis: Unsteadiness on feet (R26.81);Dizziness and giddiness (R42)  ?              ?Time: 3710-6269 ?OT Time Calculation (min): 26 min ?Charges:  OT General Charges ?$OT Visit: 1 Visit ?OT Evaluation ?$OT Eval Moderate Complexity: 1 Mod ? ?Nomi Rudnicki OTR/L, MS ?Acute Rehabilitation Department ?Office# [phone] ?Pager# [phone] ? ? ?  [place] ?08/20/2021, 1:19 PM ?

## 2021-08-20 NOTE — Evaluation (Signed)
Physical Therapy Evaluation ?Patient Details ?Name: Jesse Lane ?MRN: [id number] ?DOB: 1955/11/22 ?Today's Date: 08/20/2021 ? ?History of Present Illness ? Patient is a 66 year old male who presented to the hospital with 4 day history of diarrhea and lightheadedness. patient was found to be orthostatic, AKI,dehydration and hypokalemia  PMH: HTN, stomach ulcers, diabetic neuropathy, melignant melanoma of axilla.  ?Clinical Impression ? Pt admitted as above and presenting with functional mobility limitations 2* balance deficits, limited endurance and ongoing orthostatic hypotension.  This am BP supine 195/86; sit 152/88; stand 125/81 and standing 3 minutes 118/76.  Pt with c/o visual changes and increasing dizziness.  Pt motivated and should progress to dc home with assist of significant other and, dependent on acute stay progress, would benefit from follow up HHPT to maximize safety and independence. ?   ? ?Recommendations for follow up therapy are one component of a multi-disciplinary discharge planning process, led by the attending physician.  Recommendations may be updated based on patient status, additional functional criteria and insurance authorization. ? ?Follow Up Recommendations Home health PT (dependent on acute stay progress) ? ?  ?Assistance Recommended at Discharge Intermittent Supervision/Assistance  ?Patient can return home with the following ? A little help with walking and/or transfers;A little help with bathing/dressing/bathroom;Assistance with cooking/housework;Assist for transportation;Help with stairs or ramp for entrance ? ?  ?Equipment Recommendations None recommended by PT  ?Recommendations for Other Services ?    ?  ?Functional Status Assessment Patient has had a recent decline in their functional status and demonstrates the ability to make significant improvements in function in a reasonable and predictable amount of time.  ? ?  ?Precautions / Restrictions Precautions ?Precautions:  Fall ?Precaution Comments: orthostatic ?Restrictions ?Weight Bearing Restrictions: No  ? ?  ? ?Mobility ? Bed Mobility ?Overal bed mobility: Needs Assistance ?Bed Mobility: Supine to Sit ?  ?  ?Supine to sit: Supervision ?  ?  ?General bed mobility comments: no physical assist ?  ? ?Transfers ?Overall transfer level: Needs assistance ?Equipment used: Rolling walker (2 wheels) ?Transfers: Sit to/from Stand, Bed to chair/wheelchair/BSC ?Sit to Stand: Min guard ?  ?Step pivot transfers: Min guard, +2 safety/equipment ?  ?  ?  ?General transfer comment: steady assist only ?  ? ?Ambulation/Gait ?  ?  ?  ?  ?  ?  ?  ?General Gait Details: step pvt bed to chair with RW only 2* increasing dizziness 2* orthostatic hypotension ? ?Stairs ?  ?  ?  ?  ?  ? ?Wheelchair Mobility ?  ? ?Modified Rankin (Stroke Patients Only) ?  ? ?  ? ?Balance Overall balance assessment: Mild deficits observed, not formally tested ?  ?  ?  ?  ?  ?  ?  ?  ?  ?  ?  ?  ?  ?  ?  ?  ?  ?  ?   ? ? ? ?Pertinent Vitals/Pain Pain Assessment ?Pain Assessment: No/denies pain  ? ? ?Home Living Family/patient expects to be discharged to:: Private residence ?Living Arrangements: Spouse/significant other;Non-relatives/Friends ?Available Help at Discharge: Family ?Type of Home: House ?Home Access: Stairs to enter ?Entrance Stairs-Rails: None ?Entrance Stairs-Number of Steps: 2 ?  ?Home Layout: One level ?Home Equipment: Conservation officer, nature (2 wheels) ?Additional Comments: lives with girlfriend and roommate  ?  ?Prior Function Prior Level of Function : Independent/Modified Independent ?  ?  ?  ?  ?  ?  ?Mobility Comments: "When I walk I lean on my  girlfriends shoulder but she is blind in one eye so we help each other out" ?  ?  ? ? ?Hand Dominance  ? Dominant Hand: Left ? ?  ?Extremity/Trunk Assessment  ? Upper Extremity Assessment ?Upper Extremity Assessment: Overall WFL for tasks assessed ?  ? ?Lower Extremity Assessment ?Lower Extremity Assessment: Overall WFL for  tasks assessed ?  ? ?Cervical / Trunk Assessment ?Cervical / Trunk Assessment: Normal  ?Communication  ? Communication: No difficulties  ?Cognition Arousal/Alertness: Awake/alert ?Behavior During Therapy: [facility] for tasks assessed/performed ?Overall Cognitive Status: Within Functional Limits for tasks assessed ?  ?  ?  ?  ?  ?  ?  ?  ?  ?  ?  ?  ?  ?  ?  ?  ?  ?  ?  ? ?  ?General Comments   ? ?  ?Exercises    ? ?Assessment/Plan  ?  ?PT Assessment Patient needs continued PT services  ?PT Problem List Decreased activity tolerance;Decreased balance;Decreased mobility;Decreased knowledge of use of DME ? ?   ?  ?PT Treatment Interventions DME instruction;Gait training;Stair training;Functional mobility training;Therapeutic activities;Therapeutic exercise;Patient/family education;Balance training   ? ?PT Goals (Current goals can be found in the Care Plan section)  ?Acute Rehab PT Goals ?Patient Stated Goal: Regain IND ?PT Goal Formulation: With patient ?Time For Goal Achievement: 09/03/21 ?Potential to Achieve Goals: Good ? ?  ?Frequency Min 3X/week ?  ? ? ?Co-evaluation PT/OT/SLP Co-Evaluation/Treatment: Yes ?Reason for Co-Treatment: To address functional/ADL transfers ?PT goals addressed during session: Mobility/safety with mobility ?OT goals addressed during session: ADL's and self-care ?  ? ? ?  ?AM-PAC PT "6 Clicks" Mobility  ?Outcome Measure Help needed turning from your back to your side while in a flat bed without using bedrails?: None ?Help needed moving from lying on your back to sitting on the side of a flat bed without using bedrails?: A Little ?Help needed moving to and from a bed to a chair (including a wheelchair)?: A Little ?Help needed standing up from a chair using your arms (e.g., wheelchair or bedside chair)?: A Little ?Help needed to walk in hospital room?: Total ?Help needed climbing 3-5 steps with a railing? : Total ?6 Click Score: 15 ? ?  ?End of Session Equipment Utilized During Treatment: Gait  belt ?Activity Tolerance: Other (comment) (orthostatic) ?Patient left: in chair;with call bell/phone within reach;with chair alarm set ?Nurse Communication: Mobility status ?PT Visit Diagnosis: Unsteadiness on feet (R26.81);Difficulty in walking, not elsewhere classified (R26.2) ?  ? ?Time: 8937-3428 ?PT Time Calculation (min) (ACUTE ONLY): 27 min ? ? ?Charges:   PT Evaluation ?$PT Eval Low Complexity: 1 Low ?  ?  ?   ? ? ?Debe Coder PT ?Acute Rehabilitation Services ?Pager [phone] ?Office [phone] ? ? ?Sophonie Goforth ?08/20/2021, 2:39 PM ? ?

## 2021-08-20 NOTE — Progress Notes (Signed)
?[facility] Progress Note ? ?Patient: Jesse Lane     ?YWV:[id number]  ?DOA: 08/19/2021   ?PCP: [name], MD  ? ?  ?  ?Brief hospital course: ?This is a 66 year old male with metastatic melanoma with spread to the abdomen,Diabetes mellitus type 2, hypertension, coronary artery disease, diastolic heart failure, hyperlipidemia who got sick with diarrhea last week.  He is not sure of why the diarrhea started.  He used Imodium and eventually the diarrhea resolved.  He subsequently became very lightheaded and therefore presented to the ED.  He was noted to be dehydrated on exam.  Creatinine was 2.9.  Creatinine was 2.35 and baseline is around 1.3-1.4.  The patient does take Lasix at home.  He was admitted for dehydration ? ?Subjective:  ?He is continuing to feel dizzy today upon standing.  His oral intake is poor as he has no appetite. ?Assessment and Plan: ?Principal Problem: ?  AKI (acute kidney injury) ([place]) ?CKD stage IIIb ?Orthostatic hypotension ?-Prerenal, orthostatic vitals positive today-he remains symptomatic with dizziness upon standing ?- Holding Lasix and treating with IV fluids which will need to be continued ? ?Active Problems: ?Poor oral intake ?- Have encouraged him to continue to attempt to increase his oral intake along with fluid intake ?- Diarrhea has not recurred ? ?  Anemia of chronic disease ?- Follow ? ?  Essential hypertension ?  Insulin-requiring or dependent type II diabetes mellitus ([place]) ?- He states he takes [place] at home but because his vision is failing, he is unable to see how many units he takes ?- According to his home med list, he takes 25 units of Tresiba-as his oral intake is currently poor, I will reduce this doses to 10 units ?- He also takes NovoLog with meals which we are continuing ?- A1c when last checked in 04/13/2021 was 5.4 ? ?  Hypokalemia ?-Continue to replace as potassium remains low ?- Magnesium level is normal ? ?  Chronic diastolic CHF (congestive  heart failure) ([place]) ?-Follow closely for fluid overload ? ?  ?DVT prophylaxis: Lovenox ?  Code Status: Full Code  ?Consultants: None ?Level of Care: Level of care: Telemetry ?Disposition Plan:  ?Status is: Observation ?The patient will require care spanning > 2 midnights and should be moved to inpatient because: Orthostatic hypotension with AKI ? ?Objective: ?  ?Vitals:  ? 08/20/21 0201 08/20/21 0753 08/20/21 1006 08/20/21 1407  ?BP: (!) 162/85 (!) 162/72 (!) 174/87 (!) 166/76  ?Pulse: 64 60 61 60  ?Resp: '19 20 18 '$ (!) 22  ?Temp: 98 ?F (36.7 ?C) 98.6 ?F (37 ?C) 98.1 ?F (36.7 ?C) 98.2 ?F (36.8 ?C)  ?TempSrc: Oral Oral Oral Oral  ?SpO2: 100%   100%  ?Weight:      ?Height:      ? ?Filed Weights  ? 08/19/21 1435 08/20/21 0156  ?Weight: 85 kg 85 kg  ? ?Exam: ?General exam: Appears comfortable  ?HEENT: PERRLA, oral mucosa moist, no sclera icterus or thrush ?Respiratory system: Clear to auscultation. Respiratory effort normal. ?Cardiovascular system: S1 & S2 heard, regular rate and rhythm ?Gastrointestinal system: Abdomen soft, non-tender, nondistended. Normal bowel sounds   ?Central nervous system: Alert and oriented. No focal neurological deficits. ?Extremities: No cyanosis, clubbing or edema ?Skin: No rashes or ulcers ?Psychiatry:  Mood & affect appropriate.   ? ?Imaging and lab data was personally reviewed ? ? ? CBC: ?Recent Labs  ?Lab 08/19/21 ?1508 08/20/21 ?8546  ?WBC 4.5 4.5  ?NEUTROABS 2.6  --   ?  HGB 11.9* 10.7*  ?HCT 32.5* 29.5*  ?MCV 86.4 87.5  ?PLT 145* 137*  ? ?Basic Metabolic Panel: ?Recent Labs  ?Lab 08/19/21 ?1508 08/19/21 ?1850 08/19/21 ?2300 08/20/21 ?7356  ?NA 139  --   --  141  ?K 2.9*  --   --  3.1*  ?CL 103  --   --  109  ?CO2 24  --   --  24  ?GLUCOSE 172*  --   --  223*  ?BUN 32*  --   --  28*  ?CREATININE 2.35*  --   --  2.06*  ?CALCIUM 9.1  --   --  8.2*  ?MG  --  1.9  --   --   ?PHOS  --   --  3.4  --   ? ?GFR: ?Estimated Creatinine Clearance: 37.6 mL/min (A) (by C-G formula based on SCr of 2.06  mg/dL (H)). ? ?Scheduled Meds: ? amLODipine  5 mg Oral QHS  ? feeding supplement  237 mL Oral BID BM  ? insulin aspart  0-9 Units Subcutaneous TID WC  ? insulin glargine-yfgn  10 Units Subcutaneous Daily  ? melatonin  3 mg Oral QHS  ? montelukast  10 mg Oral QHS  ? nebivolol  2.5 mg Oral QHS  ? pregabalin  200 mg Oral Daily  ? pregabalin  300 mg Oral QHS  ? rosuvastatin  20 mg Oral QHS  ? traZODone  100 mg Oral QHS  ? ?Continuous Infusions: ? sodium chloride 75 mL/hr at 08/20/21 1305  ? ? ? LOS: 0 days  ? ?Author: ?Debbe Odea  ?08/20/2021 2:22 PM ?   ?

## 2021-08-20 NOTE — ED Notes (Signed)
MD is aware of low blood sugar ?

## 2021-08-20 NOTE — Assessment & Plan Note (Signed)
Will hold home dose of insulin  ?Follow [facility] closely treat as needed ? ?

## 2021-08-21 DIAGNOSIS — I951 Orthostatic hypotension: Secondary | ICD-10-CM

## 2021-08-21 DIAGNOSIS — N1832 Chronic kidney disease, stage 3b: Secondary | ICD-10-CM

## 2021-08-21 LAB — BASIC METABOLIC PANEL
Anion gap: 8 (ref 5–15)
BUN: 19 mg/dL (ref 8–23)
CO2: 22 mmol/L (ref 22–32)
Calcium: 8.9 mg/dL (ref 8.9–10.3)
Chloride: 108 mmol/L (ref 98–111)
Creatinine, Ser: 1.43 mg/dL — ABNORMAL HIGH (ref 0.61–1.24)
GFR, Estimated: 54 mL/min — ABNORMAL LOW (ref >60)
Glucose, Bld: 367 mg/dL — ABNORMAL HIGH (ref 70–99)
Potassium: 4.3 mmol/L (ref 3.5–5.1)
Sodium: 138 mmol/L (ref 135–145)

## 2021-08-21 LAB — CBC
HCT: 31.6 % — ABNORMAL LOW (ref 39.0–52.0)
Hemoglobin: 11.3 g/dL — ABNORMAL LOW (ref 13.0–17.0)
MCH: 31.5 pg (ref 26.0–34.0)
MCHC: 35.8 g/dL (ref 30.0–36.0)
MCV: 88 fL (ref 80.0–100.0)
Platelets: 134 10*3/uL — ABNORMAL LOW (ref 150–400)
RBC: 3.59 MIL/uL — ABNORMAL LOW (ref 4.22–5.81)
RDW: 12.9 % (ref 11.5–15.5)
WBC: 4.2 10*3/uL (ref 4.0–10.5)
nRBC: 0 % (ref 0.0–0.2)

## 2021-08-21 LAB — GLUCOSE, CAPILLARY
Glucose-Capillary: 198 mg/dL — ABNORMAL HIGH (ref 70–99)
Glucose-Capillary: 262 mg/dL — ABNORMAL HIGH (ref 70–99)
Glucose-Capillary: 331 mg/dL — ABNORMAL HIGH (ref 70–99)

## 2021-08-21 MED ORDER — ENSURE ENLIVE PO LIQD: 237.0000 mL | Freq: Two times a day (BID) | ORAL | 12 refills | Status: DC

## 2021-08-21 MED ORDER — FUROSEMIDE 40 MG PO TABS: 40.0000 mg | ORAL_TABLET | Freq: Every day | ORAL | 0 refills | Status: DC | PRN

## 2021-08-21 NOTE — Discharge Summary (Signed)
Physician Discharge Summary  ?Jesse Lane UUV:[id number] DOB: 11/11/1955 DOA: 08/19/2021 ? ?PCP: Jesse Rasmussen, MD ? ?Admit date: 08/19/2021 ?Discharge date: 08/21/2021 ?Discharging to: home ?Recommendations for Outpatient Follow-up:  ?F/u on BP and re-initiate Lisinopril as needed ? ?Consults:  ?none ?Procedures:  ?none ? ? ?Discharge Diagnoses:  ? Principal Problem: ?  AKI (acute kidney injury) ([place]) ?Active Problems: ?  Orthostatic hypotension ?  Anemia of chronic disease ?  Essential hypertension ?  Insulin-requiring or dependent type II diabetes mellitus ([place]) ?  CKD (chronic kidney disease), stage II ?  Hypokalemia ?  GERD (gastroesophageal reflux disease) ?  Chronic diastolic CHF (congestive heart failure) ([place]) ?  CAD (coronary artery disease) ?    Uncontrolled type 2 diabetes mellitus with hypoglycemia, with long-term current use of insulin ([place]) ? ? ? ? ?Hospital Course:  ?This is a 66 year old male with metastatic melanoma with spread to the abdomen,Diabetes mellitus type 2, hypertension, coronary artery disease, diastolic heart failure, hyperlipidemia who got sick with diarrhea last week.  He is not sure of why the diarrhea started.  He used Imodium and eventually the diarrhea resolved.  He subsequently became very lightheaded and therefore presented to the ED.   ? ?He was noted to be dehydrated on exam.   ?Creatinine was 2.35 and baseline is around 1.3-1.4.  The patient does take Lasix at home.  He was admitted for dehydration ? ?Principal Problem: ?  AKI (acute kidney injury) ([place]) ?CKD stage IIIb ?Orthostatic hypotension ?-Prerenal, orthostatic vitals positive ?- Held Lasix, treated with IVF ?- Cr improved to 1.43 ?- - orthostatic vitals positive again today but he is asymptomatic- BP drops to 90 systolic when standing- will cont to hold Lisinopril on dc and recommend PRN use of Lasix for now. ?  ?Active Problems: ?   Essential hypertension- Chronic diastolic CHF (congestive heart failure) ([facility]) ?-  no signs of fluid overload ?  ?Poor oral intake ?- resolved ?  ?  Anemia of chronic disease ?- Follow ?  ?  Insulin-requiring or dependent type II diabetes mellitus ([place]) ?- He states he takes [place] at home but because his vision is failing, he is unable to see how many units he takes ?- According to his home med list, he takes 25 units of [place] Aas - He also takes NovoLog with meals which we are continuing ?- A1c when last checked in 04/13/2021 was 5.4 ? ?  Hypokalemia ?- replaced ?- Magnesium level is normal ?  ?Discharge Instructions ? ?Discharge Instructions   ? ? (HEART FAILURE PATIENTS) Call MD:  Anytime you have any of the following symptoms: 1) 3 pound weight gain in 24 hours or 5 pounds in 1 week 2) shortness of breath, with or without a dry hacking cough 3) swelling in the hands, feet or stomach 4) if you have to sleep on extra pillows at night in order to breathe.   Complete by: As directed ?  ? Diet - low sodium heart healthy   Complete by: As directed ?  ? Increase activity slowly   Complete by: As directed ?  ? ?  ? ?Allergies as of 08/21/2021   ? ?   Reactions  ? Contrast Media [iodinated Contrast Media] Itching  ? ?  ? ?  ?Medication List  ?  ? ?STOP taking these medications   ? ?lisinopril 40 MG tablet ?Commonly known as: ZESTRIL ?  ? ?  ? ?TAKE these medications   ? ?acetaminophen 500 MG  tablet ?Commonly known as: TYLENOL ?Take 1,000 mg by mouth every 6 (six) hours as needed for mild pain. ?  ?alendronate 70 MG tablet ?Commonly known as: FOSAMAX ?Take 70 mg by mouth once a week. Wednesday's ?  ?amLODipine 5 MG tablet ?Commonly known as: NORVASC ?Take 1 tablet (5 mg total) by mouth daily. ?  ?feeding supplement Liqd ?Take 237 mLs by mouth 2 (two) times daily between meals. ?Start taking on: Aug 22, 2021 ?  ?furosemide 40 MG tablet ?Commonly known as: LASIX ?Take 1 tablet (40 mg total) by mouth daily as needed. Take only if 3 lbs weight gain. ?What changed:  ?when to take this ?reasons to take  this ?additional instructions ?  ?insulin lispro 100 UNIT/ML KwikPen ?Commonly known as: HUMALOG ?Inject 7-9 Units into the skin 3 (three) times daily. Per sliding scale ?  ?Insulin Pen Needle 32G X 6 MM Misc ?Commonly known as: BD Pen Needle Micro U/F ?1 each by Other route See admin instructions. Use one pen needle to inject insulin 4 times daily. **must have appointment for additional refills.** ?What changed: Another medication with the same name was changed. Make sure you understand how and when to take each. ?  ?Insulin Pen Needle 32G X 6 MM Misc ?Commonly known as: BD Pen Needle Micro U/F ?USE 1 PEN NEEDLE SIX TIMES DAILY ?What changed:  ?how much to take ?how to take this ?when to take this ?  ?MELATONIN PO ?Take 30 mg by mouth at bedtime. ?  ?montelukast 10 MG tablet ?Commonly known as: SINGULAIR ?Take 10 mg by mouth at bedtime. ?  ?multivitamin with minerals Tabs tablet ?Take 1 tablet by mouth daily. ?  ?nebivolol 2.5 MG tablet ?Commonly known as: Bystolic ?Take 1 tablet (2.5 mg total) by mouth daily. ?  ?ondansetron 4 MG tablet ?Commonly known as: ZOFRAN ?Take 1 tablet (4 mg total) by mouth every 6 (six) hours as needed for nausea. ?  ?pantoprazole 40 MG tablet ?Commonly known as: PROTONIX ?Take 1 tablet by mouth twice daily ?  ?potassium chloride SA 20 MEQ tablet ?Commonly known as: KLOR-CON M ?20 mEq daily ?  ?pregabalin 100 MG capsule ?Commonly known as: LYRICA ?Take 200-300 mg by mouth See admin instructions. Take 2 capsule (200 mg) by mouth every morning and take three capsules (300 mg) at bedtime as needed for nerve pain ?  ?rosuvastatin 20 MG tablet ?Commonly known as: CRESTOR ?Take 1 tablet (20 mg total) by mouth daily. ?  ?traZODone 100 MG tablet ?Commonly known as: DESYREL ?Take 100 mg by mouth at bedtime. ?  ?Tyler Aas FlexTouch 100 UNIT/ML FlexTouch Pen ?Generic drug: insulin degludec ?Inject 25 Units into the skin daily before breakfast. ?  ? ?  ? ? ?  ?  ?The results of significant diagnostics  from this hospitalization (including imaging, microbiology, ancillary and laboratory) are listed below for reference.   ? ?CT ABDOMEN PELVIS WO CONTRAST ? ?Result Date: 08/19/2021 ?CLINICAL DATA:  Abdominal pain EXAM: CT ABDOMEN AND PELVIS WITHOUT CONTRAST TECHNIQUE: Multidetector CT imaging of the abdomen and pelvis was performed following the standard protocol without IV contrast. RADIATION DOSE REDUCTION: This exam was performed according to the departmental dose-optimization program which includes automated exposure control, adjustment of the mA and/or kV according to patient size and/or use of iterative reconstruction technique. COMPARISON:  10/06/2020 FINDINGS: Lower chest: No acute abnormality. Hepatobiliary: No focal hepatic abnormality. Gallbladder unremarkable. Pancreas: No focal abnormality or ductal dilatation. Spleen: No focal abnormality.  Normal size.  Adrenals/Urinary Tract: No adrenal abnormality. No focal renal abnormality. No stones or hydronephrosis. Urinary bladder is unremarkable. Stomach/Bowel: Normal appendix. Duodenal diverticulum noted in the region of the 2nd portion of the duodenum, stable since prior study. Stomach, large and small bowel grossly unremarkable. Vascular/Lymphatic: Aortic atherosclerosis. No evidence of aneurysm or adenopathy. Reproductive: No visible focal abnormality. Other: No free fluid or free air. Musculoskeletal: No acute bony abnormality. IMPRESSION: No acute findings in the abdomen or pelvis. Electronically Signed   By: Rolm Baptise M.D.   On: 08/19/2021 19:13  ? ?DG Chest 2 View ? ?Result Date: 08/19/2021 ?CLINICAL DATA:  Shortness of breath, dizziness EXAM: CHEST - 2 VIEW COMPARISON:  05/03/2021 FINDINGS: Transverse diameter of heart is slightly increased. There are no signs of pulmonary edema or focal pulmonary consolidation. There is no pleural effusion or pneumothorax. IMPRESSION: No active cardiopulmonary disease. Electronically Signed   By: Elmer Picker  M.D.   On: 08/19/2021 15:00   ?Labs: ?BNP (last 3 results) ?Recent Labs  ?  04/12/21 ?0701 05/03/21 ?2000 08/19/21 ?1503  ?BNP 741.4* 491.2* 53.4  ? ?Basic Metabolic Panel: ?Recent Labs  ?Lab 08/19/21 ?[name]

## 2021-08-21 NOTE — Plan of Care (Signed)
?  Problem: Education: ?Goal: Knowledge of General Education information will improve ?Description: Including pain rating scale, medication(s)/side effects and non-pharmacologic comfort measures ?Outcome: Progressing ?  ?Problem: Clinical Measurements: ?Goal: Will remain free from infection ?Outcome: Progressing ?Goal: Diagnostic test results will improve ?Outcome: Progressing ?Goal: Respiratory complications will improve ?Outcome: Progressing ?Goal: Cardiovascular complication will be avoided ?Outcome: Progressing ?  ?Problem: Activity: ?Goal: Risk for activity intolerance will decrease ?Outcome: Progressing ?  ?Problem: Nutrition: ?Goal: Adequate nutrition will be maintained ?Outcome: Progressing ?  ?Problem: Elimination: ?Goal: Will not experience complications related to bowel motility ?Outcome: Progressing ?Goal: Will not experience complications related to urinary retention ?Outcome: Progressing ?  ?

## 2021-08-21 NOTE — Plan of Care (Signed)
PIV removed. DC paperwork reviewed. ? ? ?Problem: Education: ?Goal: Knowledge of General Education information will improve ?Description: Including pain rating scale, medication(s)/side effects and non-pharmacologic comfort measures ?Outcome: Completed/Met ?  ?Problem: Health Behavior/Discharge Planning: ?Goal: Ability to manage health-related needs will improve ?Outcome: Completed/Met ?  ?Problem: Clinical Measurements: ?Goal: Ability to maintain clinical measurements within normal limits will improve ?Outcome: Completed/Met ?Goal: Will remain free from infection ?Outcome: Completed/Met ?Goal: Diagnostic test results will improve ?Outcome: Completed/Met ?Goal: Respiratory complications will improve ?Outcome: Completed/Met ?Goal: Cardiovascular complication will be avoided ?Outcome: Completed/Met ?  ?Problem: Activity: ?Goal: Risk for activity intolerance will decrease ?Outcome: Completed/Met ?  ?Problem: Nutrition: ?Goal: Adequate nutrition will be maintained ?Outcome: Completed/Met ?  ?Problem: Coping: ?Goal: Level of anxiety will decrease ?Outcome: Completed/Met ?  ?Problem: Elimination: ?Goal: Will not experience complications related to bowel motility ?Outcome: Completed/Met ?Goal: Will not experience complications related to urinary retention ?Outcome: Completed/Met ?  ?Problem: Pain Managment: ?Goal: General experience of comfort will improve ?Outcome: Completed/Met ?  ?Problem: Safety: ?Goal: Ability to remain free from injury will improve ?Outcome: Completed/Met ?  ?Problem: Skin Integrity: ?Goal: Risk for impaired skin integrity will decrease ?Outcome: Completed/Met ?  ?

## 2021-08-30 ENCOUNTER — Encounter: Payer: Self-pay | Admitting: Physician Assistant

## 2021-08-30 ENCOUNTER — Ambulatory Visit (INDEPENDENT_AMBULATORY_CARE_PROVIDER_SITE_OTHER): Payer: Medicare Other | Admitting: Physician Assistant

## 2021-08-30 VITALS — BP 126/70 | HR 54 | Ht 71.0 in | Wt 184.0 lb

## 2021-08-30 DIAGNOSIS — K219 Gastro-esophageal reflux disease without esophagitis: Secondary | ICD-10-CM

## 2021-08-30 DIAGNOSIS — R630 Anorexia: Secondary | ICD-10-CM

## 2021-08-30 DIAGNOSIS — R197 Diarrhea, unspecified: Secondary | ICD-10-CM

## 2021-08-30 DIAGNOSIS — R110 Nausea: Secondary | ICD-10-CM | POA: Diagnosis not present

## 2021-08-30 DIAGNOSIS — R1013 Epigastric pain: Secondary | ICD-10-CM | POA: Diagnosis not present

## 2021-08-30 MED ORDER — FAMOTIDINE 20 MG PO TABS: 20.0000 mg | ORAL_TABLET | Freq: Every day | ORAL | 3 refills | Status: DC

## 2021-08-30 MED ORDER — ONDANSETRON HCL 4 MG PO TABS: ORAL_TABLET | ORAL | 6 refills | Status: DC

## 2021-08-30 NOTE — Progress Notes (Signed)
I agree with the above note, plan 

## 2021-08-30 NOTE — Patient Instructions (Addendum)
If you are age 67 or older, your body mass index should be between 23-30. Your Body mass index is 25.66 kg/m. If this is out of the aforementioned range listed, please consider follow up with your Primary Care Provider. ________________________________________________________  The [facility] providers would like to encourage you to use [facility] to communicate with providers for non-urgent requests or questions.  Due to long hold times on the telephone, sending your provider a message by [facility] may be a faster and more efficient way to get a response.  Please allow 48 business hours for a response.  Please remember that this is for non-urgent requests.  _______________________________________________________  Pantoprazole '40mg'$  one tablet twice daily.  Take 30 minutes prior to your breakfast meal and 30 minutes before last meal of the day.  We have sent the following medications to your pharmacy for you to pick up at your convenience:  START:  famotidine '20mg'$  one tablet at night. Zofran  You will follow up in our office in 2 months.  We will contact you to schedule this appointment.  Thank you for entrusting me with your care and choosing [facility].  Ellouise Newer, PA-C

## 2021-08-30 NOTE — Progress Notes (Signed)
Chief Complaint: "Stomach issues"  HPI:  Jesse Lane is a 66 year old Caucasian male with a past medical history including diabetes, stroke 10/16/2019, LVEF 65-70%, stage IV malignant melanoma with lymph node metastasis previously treated with Keytruda and now on surveillance imaging q6 mos and gastroparesis, known to Dr. Ardis Hughs, who presents to clinic today with "stomach issues".  08/27/2019 CT the chest abdomen pelvis with contrast which showed no evidence of recurrent metastatic disease in the chest, abdomen or pelvis.  Stomach and bowel were normal.    10/16/2019-10/18/2019 patient admitted to hospital for encephalopathy due to AKI and hypertension as well as strokelike symptoms treated with IV TPA.  He was started on aspirin at discharge.  Apparently at that time discussed a history of gastric ulcers, patient was on Carafate, Prevacid and Prilosec at some point.  It was recommended he follow-up with [place].    10/17/2019 hemoglobin A1c 6.4.    12/09/2019 patient seen in clinic by me for epigastric discomfort and nausea with a history of gastric ulcers.  At that time patient discussed that he had not had a stroke recently and just presented as one after kidney failure from IV contrast.  At that time was cancer free for 2 years but continued with some nausea and episodes of vomiting.  He was scheduled for an EGD and started on pantoprazole 40 twice daily.  Also continued sucralfate.    02/15/2020 colonoscopy with diverticulosis in the entire colon, 5 2-6 mm polyps in sigmoid, transverse and descending colon and otherwise normal.  Pathology showed adenomatous polyps and repeat was recommended in 5 years.    02/15/2020 EGD with short segment, nonnodular Barrett's change classified as Barrett's stage C1-M2, gastritis and otherwise normal.  Repeat was recommended in 3 years.    06/07/2020 patient seen in clinic and described for the past couple of years he would have episodes about a week out of the month.  Have no  energy no appetite and feels very full very fast and bloated.  Ordered a gastric emptying study for further evaluation continue pantoprazole and sucralfate.  Explained that if the gastric emptying study was normal then could consider SIBO testing.    07/03/2020 gastric emptying study showed delayed gastric emptying.  He was started on Reglan 5 mg twice daily 20 to 30 minutes before breakfast and then again before dinner.    07/26/2020 patient seen in clinic and described that he had not had any severe episodes of his gastroparesis in the past past month since being on Reglan 5 mg twice daily.  He had not been needing his Carafate or pantoprazole on a regular basis.  At that time patient wanted to try a small increase in frequency of medicine to see if it helped with bloating.  Increased Reglan to 5 mg 3 times daily.    08/27/2020-08/29/2020 patient hospitalized after presenting with intractable nausea and vomiting.  At time of discharge his Reglan was increased to 10 mg 4 times daily.  CT of the abdomen pelvis without contrast during admission showed no renal or ureteral stones, aortic atherosclerosis, sigmoid diverticulosis and no acute findings in the abdomen or pelvis.    09/21/2020 patient was following up and was completely better.  At that time was taking his Metoclopramide 10 mg 3 times daily.  He requested some Zofran to have on hand.  At that time continued his Metoclopramide 10 mg 3 times daily and sent in a prescription for Zofran.    11/28/2020 patient called in  and described feeling shaky and wanted to know if it was due to his metoclopramide.  At that time recommended he discontinue Reglan to see if this is causing his shaking.    12/29/2020 patient called to discuss some diarrhea.  Abdominal x-ray was ordered to check for overflow constipation.    01/03/2021 x-ray showed large amount of stool throughout the colon consistent with constipation.  At that time recommended that he did about MiraLAX bowel  purge and then twice daily MiraLAX to keep things moving.    02/01/2021 patient called in describing severe diarrhea.  At that time had recently been diagnosed with COVID and double pneumonia.  He reported his stools have been the same since he did the bowel purge.  Initially the purge did help.  He was advised to try over-the-counter Imodium.    02/10/2021 patient seen in clinic and at that time was feeling pretty good.  He had recently been in the hospital double pneumonia and COVID and was having stools 10 times a day.  He had also stopped with hand tremors which she thought was from [place] which we have stopped back in August 2022.  At time discussed that if he had return of diarrhea then would recommend further stool testing.  Also discussed gastroparesis.  Revisited gastroparesis diet recommendations.    08/19/2021-08/21/2021 patient admitted for AKI.  He had been having diarrhea and had used Imodium and eventually the diarrhea resolved.  And he became very lightheaded so he came to the ED.  Creatinine was 2.35 from baseline around 1.3-1.4.  It improved to 1.43.  CT abdomen pelvis without contrast that admission showed no acute findings.    Today, the patient tells me that his biggest complaint really is that oftentimes he will wake up in the morning and just feels some discomfort in his abdomen.  Also tells me that he has a decreased appetite but he "forces himself to eat".  Eating oftentimes does help his discomfort.  Describes that he has been taking his Pantoprazole 40 mg sometimes 3 times a day lately as he feels like this may be helps "a little bit".  Also using Zofran as needed for him some ongoing nausea.  Tells me he is now on the Mediterranean diet and has lost about 10 to 15 pounds.  Overall he is feeling better since his recent hospitalization.  Does continue with occasional bouts of diarrhea which resolve after a few days of Imodium.    Denies fever, chills, weight loss, blood in his stool,  vomiting or symptoms that awaken him from sleep.  Past Medical History:  Diagnosis Date   Arthritis    Diabetes mellitus type 2, uncontrolled    Diabetic neuropathy (HCC)    GERD (gastroesophageal reflux disease)    History of stomach ulcers    Hypertension    Melanoma ([place])    mets to lymph node and intestines    Past Surgical History:  Procedure Laterality Date   ABCESS DRAINAGE     gluteal area   COLONOSCOPY     INGUINAL HERNIA REPAIR N/A 08/15/2015   Procedure: HERNIA REPAIR INGUINAL INCARCERATED;  Surgeon: Alphonsa Overall, MD;  Location: [facility];  Service: General;  Laterality: N/A;  with MESH   pyloric stenosis     TONSILLECTOMY      Current Outpatient Medications  Medication Sig Dispense Refill   acetaminophen (TYLENOL) 500 MG tablet Take 1,000 mg by mouth every 6 (six) hours as needed for mild pain.  alendronate (FOSAMAX) 70 MG tablet Take 70 mg by mouth once a week. Wednesday's (Patient not taking: Reported on 08/19/2021)     amLODipine (NORVASC) 5 MG tablet Take 1 tablet (5 mg total) by mouth daily. 30 tablet 0   feeding supplement (ENSURE ENLIVE / ENSURE PLUS) LIQD Take 237 mLs by mouth 2 (two) times daily between meals. 237 mL 12   furosemide (LASIX) 40 MG tablet Take 1 tablet (40 mg total) by mouth daily as needed. Take only if 3 lbs weight gain. 30 tablet 0   insulin degludec (TRESIBA FLEXTOUCH) 100 UNIT/ML FlexTouch Pen Inject 25 Units into the skin daily before breakfast.     insulin lispro (HUMALOG) 100 UNIT/ML KwikPen Inject 7-9 Units into the skin 3 (three) times daily. Per sliding scale     Insulin Pen Needle (BD PEN NEEDLE MICRO U/F) 32G X 6 MM MISC 1 each by Other route See admin instructions. Use one pen needle to inject insulin 4 times daily. **must have appointment for additional refills.** 150 each 0   Insulin Pen Needle (BD PEN NEEDLE MICRO U/F) 32G X 6 MM MISC USE 1 PEN NEEDLE SIX TIMES DAILY (Patient taking differently: 1 each by Other route See admin  instructions. USE 1 PEN NEEDLE SIX TIMES DAILY) 200 each 2   MELATONIN PO Take 30 mg by mouth at bedtime.     montelukast (SINGULAIR) 10 MG tablet Take 10 mg by mouth at bedtime.     Multiple Vitamin (MULTIVITAMIN WITH MINERALS) TABS tablet Take 1 tablet by mouth daily.     nebivolol (BYSTOLIC) 2.5 MG tablet Take 1 tablet (2.5 mg total) by mouth daily. 90 tablet 3   ondansetron (ZOFRAN) 4 MG tablet Take 1 tablet (4 mg total) by mouth every 6 (six) hours as needed for nausea. 30 tablet 5   pantoprazole (PROTONIX) 40 MG tablet Take 1 tablet by mouth twice daily (Patient taking differently: Take 40 mg by mouth 2 (two) times daily.) 60 tablet 11   potassium chloride SA (KLOR-CON M) 20 MEQ tablet 20 mEq daily (Patient not taking: Reported on 08/19/2021) 36 tablet 0   pregabalin (LYRICA) 100 MG capsule Take 200-300 mg by mouth See admin instructions. Take 2 capsule (200 mg) by mouth every morning and take three capsules (300 mg) at bedtime as needed for nerve pain     rosuvastatin (CRESTOR) 20 MG tablet Take 1 tablet (20 mg total) by mouth daily. 90 tablet 3   traZODone (DESYREL) 100 MG tablet Take 100 mg by mouth at bedtime.  0   No current facility-administered medications for this visit.    Allergies as of 08/30/2021 - Review Complete 08/19/2021  Allergen Reaction Noted   Contrast media [iodinated contrast media] Itching 10/12/2019    Family History  Problem Relation Age of Onset   Breast cancer Mother    Bone cancer Mother    Melanoma Mother    Prostate cancer Father    Pancreatic cancer Maternal Grandfather    Diabetes Paternal Aunt    Colon cancer Neg Hx    Colon polyps Neg Hx    Esophageal cancer Neg Hx    Stomach cancer Neg Hx     Social History   Socioeconomic History   Marital status: Significant Other    Spouse name: Tula Nakayama- S/O   Number of children: 1   Years of education: Not on file   Highest education level: Some college, no degree  Occupational History    Occupation:  retired  Tobacco Use   Smoking status: Former    Packs/day: 1.00    Years: 8.00    Pack years: 8.00    Types: Cigarettes    Quit date: 1998    Years since quitting: 25.4   Smokeless tobacco: Never  Vaping Use   Vaping Use: Never used  Substance and Sexual Activity   Alcohol use: Yes    Alcohol/week: 4.0 standard drinks    Types: 4 Glasses of wine per week    Comment: occasionally   Drug use: Not Currently    Frequency: 7.0 times per week    Types: Marijuana    Comment: daily   Sexual activity: Not on file  Other Topics Concern   Not on file  Social History Narrative   Not on file   Social Determinants of Health   Financial Resource Strain: High Risk   Difficulty of Paying Living Expenses: Hard  Food Insecurity: No Food Insecurity   Worried About Running Out of Food in the Last Year: Never true   Ran Out of Food in the Last Year: Never true  Transportation Needs: No Transportation Needs   Lack of Transportation (Medical): No   Lack of Transportation (Non-Medical): No  Physical Activity: Not on file  Stress: Not on file  Social Connections: Not on file  Intimate Partner Violence: Not on file    Review of Systems:    Constitutional: No weight loss, fever or chills Cardiovascular: No chest pain   Respiratory: No SOB Gastrointestinal: See HPI and otherwise negative   Physical Exam:  Vital signs: BP 126/70   Pulse (!) 54   Ht '5\' 11"'$  (1.803 m)   Wt 184 lb (83.5 kg)   BMI 25.66 kg/m    Constitutional:   Pleasant Caucasian male appears to be in NAD, Well developed, Well nourished, alert and cooperative Respiratory: Respirations even and unlabored. Lungs clear to auscultation bilaterally.   No wheezes, crackles, or rhonchi.  Cardiovascular: Normal S1, S2. No MRG. Regular rate and rhythm. No peripheral edema, cyanosis or pallor.  Gastrointestinal:  Soft, nondistended, nontender. No rebound or guarding. Normal bowel sounds. No appreciable masses or  hepatomegaly. Rectal:  Not performed.  Psychiatric: Oriented to person, place and time. Demonstrates good judgement and reason without abnormal affect or behaviors.  RELEVANT LABS AND IMAGING: CBC    Component Value Date/Time   WBC 4.2 08/21/2021 1132   RBC 3.59 (L) 08/21/2021 1132   HGB 11.3 (L) 08/21/2021 1132   HCT 31.6 (L) 08/21/2021 1132   PLT 134 (L) 08/21/2021 1132   MCV 88.0 08/21/2021 1132   MCH 31.5 08/21/2021 1132   MCHC 35.8 08/21/2021 1132   RDW 12.9 08/21/2021 1132   LYMPHSABS 1.5 08/19/2021 1508   MONOABS 0.3 08/19/2021 1508   EOSABS 0.0 08/19/2021 1508   BASOSABS 0.0 08/19/2021 1508    CMP     Component Value Date/Time   NA 138 08/21/2021 1132   NA 143 07/13/2021 1051   K 4.3 08/21/2021 1132   CL 108 08/21/2021 1132   CO2 22 08/21/2021 1132   GLUCOSE 367 (H) 08/21/2021 1132   BUN 19 08/21/2021 1132   BUN 26 07/13/2021 1051   CREATININE 1.43 (H) 08/21/2021 1132   CALCIUM 8.9 08/21/2021 1132   PROT 6.5 08/20/2021 0334   ALBUMIN 3.8 08/20/2021 0334   AST 17 08/20/2021 0334   ALT 15 08/20/2021 0334   ALKPHOS 62 08/20/2021 0334   BILITOT 2.1 (H) 08/20/2021 7673  GFRNONAA 54 (L) 08/21/2021 1132   GFRAA >60 10/18/2019 0339    Assessment: 1.  Epigastric discomfort: Occurring over the past couple of months, in the setting of known gastroparesis which is likely exacerbating symptoms (cannot tolerate Reglan), Pantoprazole seems to help when he takes it 2-3 times a day, for the most part seems to be tolerating his Mediterranean diet, also experiencing a decrease in appetite; consider chronic gastritis and GERD 2.  GERD 3.  Nausea 4.  Diarrhea: Occasional episodes for 2 to 3 days which resolved with Imodium, normal solid bowel movement this morning; likely IBS versus relation to diet  Plan: 1.  At this time we will try to come up with a better regimen for his reflux.  Recommend he take his Pantoprazole 40 mg twice daily, scheduled 30 minutes before his first  meal the day and his last meal the day.  He has this medication at home. 2.  We will add Famotidine 40 mg nightly, prescribed #30 with 5 refills 3.  Refill Zofran 4 mg every 4-6 hours as needed for nausea #30 with 5 refills 4.  Continue Imodium as needed for episodes of diarrhea, I do not think we need stool studies at this time given that patient radiates back-and-forth from normal to diarrheal stools 5.  Patient will follow the clinic with me in 2 months or sooner if necessary.  Ellouise Newer, PA-C [facility] 08/30/2021, 8:34 AM  Cc: Jesse Rasmussen, MD

## 2021-09-15 ENCOUNTER — Ambulatory Visit: Payer: Medicare Other | Admitting: Cardiovascular Disease

## 2021-10-20 ENCOUNTER — Ambulatory Visit (INDEPENDENT_AMBULATORY_CARE_PROVIDER_SITE_OTHER): Payer: Medicare Other | Admitting: Physician Assistant

## 2021-10-20 ENCOUNTER — Encounter: Payer: Self-pay | Admitting: Physician Assistant

## 2021-10-20 VITALS — BP 110/64 | HR 60 | Ht 69.69 in | Wt 185.5 lb

## 2021-10-20 DIAGNOSIS — R197 Diarrhea, unspecified: Secondary | ICD-10-CM

## 2021-10-20 DIAGNOSIS — R110 Nausea: Secondary | ICD-10-CM

## 2021-10-20 DIAGNOSIS — R142 Eructation: Secondary | ICD-10-CM

## 2021-10-20 DIAGNOSIS — K3184 Gastroparesis: Secondary | ICD-10-CM | POA: Diagnosis not present

## 2021-10-20 MED ORDER — LOPERAMIDE HCL 2 MG PO CAPS: 2.0000 mg | ORAL_CAPSULE | Freq: Every day | ORAL | 1 refills | Status: DC

## 2021-10-20 NOTE — Progress Notes (Signed)
Chief Complaint: "Stomach issues"  HPI:    Jesse Lane is a 66 year old Caucasian male with a past medical history including diabetes, stroke (10/16/2019), LVEF 65-70%, stage IV malignant melanoma with lymph node metastasis previously treated with Keytruda and now on surveillance imaging every 6 months and gastroparesis, known to Dr. Ardis Hughs, who returns to clinic today for follow-up of his "stomach issues". 08/27/2019 CT the chest abdomen pelvis with contrast which showed no evidence of recurrent metastatic disease in the chest, abdomen or pelvis.  Stomach and bowel were normal.    10/16/2019-10/18/2019 patient admitted to hospital for encephalopathy due to AKI and hypertension as well as strokelike symptoms treated with IV TPA.  He was started on aspirin at discharge.  Apparently at that time discussed a history of gastric ulcers, patient was on Carafate, Prevacid and Prilosec at some point.  It was recommended he follow-up with [place].    10/17/2019 hemoglobin A1c 6.4.    12/09/2019 patient seen in clinic by me for epigastric discomfort and nausea with a history of gastric ulcers.  At that time patient discussed that he had not had a stroke recently and just presented as one after kidney failure from IV contrast.  At that time was cancer free for 2 years but continued with some nausea and episodes of vomiting.  He was scheduled for an EGD and started on pantoprazole 40 twice daily.  Also continued sucralfate.    02/15/2020 colonoscopy with diverticulosis in the entire colon, 5 2-6 mm polyps in sigmoid, transverse and descending colon and otherwise normal.  Pathology showed adenomatous polyps and repeat was recommended in 5 years.    02/15/2020 EGD with short segment, nonnodular Barrett's change classified as Barrett's stage C1-M2, gastritis and otherwise normal.  Repeat was recommended in 3 years.    06/07/2020 patient seen in clinic and described for the past couple of years he would have episodes about a week out  of the month.  Have no energy no appetite and feels very full very fast and bloated.  Ordered a gastric emptying study for further evaluation continue pantoprazole and sucralfate.  Explained that if the gastric emptying study was normal then could consider SIBO testing.    07/03/2020 gastric emptying study showed delayed gastric emptying.  He was started on Reglan 5 mg twice daily 20 to 30 minutes before breakfast and then again before dinner.    07/26/2020 patient seen in clinic and described that he had not had any severe episodes of his gastroparesis in the past past month since being on Reglan 5 mg twice daily.  He had not been needing his Carafate or pantoprazole on a regular basis.  At that time patient wanted to try a small increase in frequency of medicine to see if it helped with bloating.  Increased Reglan to 5 mg 3 times daily.    08/27/2020-08/29/2020 patient hospitalized after presenting with intractable nausea and vomiting.  At time of discharge his Reglan was increased to 10 mg 4 times daily.  CT of the abdomen pelvis without contrast during admission showed no renal or ureteral stones, aortic atherosclerosis, sigmoid diverticulosis and no acute findings in the abdomen or pelvis.    09/21/2020 patient was following up and was completely better.  At that time was taking his Metoclopramide 10 mg 3 times daily.  He requested some Zofran to have on hand.  At that time continued his Metoclopramide 10 mg 3 times daily and sent in a prescription for Zofran.  11/28/2020 patient called in and described feeling shaky and wanted to know if it was due to his metoclopramide.  At that time recommended he discontinue Reglan to see if this is causing his shaking.    12/29/2020 patient called to discuss some diarrhea.  Abdominal x-ray was ordered to check for overflow constipation.    01/03/2021 x-ray showed large amount of stool throughout the colon consistent with constipation.  At that time recommended that he did  about MiraLAX bowel purge and then twice daily MiraLAX to keep things moving.    02/01/2021 patient called in describing severe diarrhea.  At that time had recently been diagnosed with COVID and double pneumonia.  He reported his stools have been the same since he did the bowel purge.  Initially the purge did help.  He was advised to try over-the-counter Imodium.    02/10/2021 patient seen in clinic and at that time was feeling pretty good.  He had recently been in the hospital double pneumonia and COVID and was having stools 10 times a day.  He had also stopped with hand tremors which she thought was from [place] which we have stopped back in August 2022.  At time discussed that if he had return of diarrhea then would recommend further stool testing.  Also discussed gastroparesis.  Revisited gastroparesis diet recommendations.    08/19/2021-08/21/2021 patient admitted for AKI.  He had been having diarrhea and had used Imodium and eventually the diarrhea resolved.  And he became very lightheaded so he came to the ED.  Creatinine was 2.35 from baseline around 1.3-1.4.  It improved to 1.43.  CT abdomen pelvis without contrast that admission showed no acute findings.    08/30/2021 patient described that his biggest complaint was that he would wake up in the morning and just feel discomfort in his abdomen.  Also decreased appetite.  He had been taking his Pantoprazole 40 mg sometimes 3 times a day which may be helped a little bit as well as Zofran.  He had lost 10 to 15 pounds on the Mediterranean diet.  At that time I explained that he should take his Pantoprazole 40 mg twice a day schedule before his first meal and last and we added Famotidine 40 mg nightly.  Also refilled Zofran 4 mg every 4-6 hours.  Continue Imodium as needed for diarrhea.  Again discussed that he could not tolerate Reglan and likely his gastroparesis was exacerbating symptoms.    Today, patient explains that since taking his Pantoprazole correctly  he is doing some better.  His typical schedule is that he will wake up around 4 or 5 in the morning and take a Pantoprazole and Zofran and eat a little something, maybe just a piece of watermelon, and then he is able to lay back down and have no nausea or abdominal pain the rest of the day.  Tells me he has been "burping a lot".  Describes that he continues to lose weight on his Mediterranean diet and also feels like this is helping with his symptoms.  He is not sure if he is taking the Famotidine at night or what dose it is because he just takes "a big pile of pills".  Does tell me he still gets occasional episodes of diarrhea and will use his Imodium.  Overall feels like things are stable/slowly improving at the moment.    Denies fever, chills, weight loss or blood in his stool.  Past Medical History:  Diagnosis Date   Arthritis  Diabetes mellitus type 2, uncontrolled    Diabetic neuropathy (HCC)    GERD (gastroesophageal reflux disease)    History of stomach ulcers    Hypertension    Melanoma ([place])    mets to lymph node and intestines    Past Surgical History:  Procedure Laterality Date   ABCESS DRAINAGE     gluteal area   COLONOSCOPY     INGUINAL HERNIA REPAIR N/A 08/15/2015   Procedure: HERNIA REPAIR INGUINAL INCARCERATED;  Surgeon: Alphonsa Overall, MD;  Location: [facility];  Service: General;  Laterality: N/A;  with MESH   pyloric stenosis     TONSILLECTOMY      Current Outpatient Medications  Medication Sig Dispense Refill   acetaminophen (TYLENOL) 500 MG tablet Take 1,000 mg by mouth every 6 (six) hours as needed for mild pain.     alendronate (FOSAMAX) 70 MG tablet Take 70 mg by mouth once a week. Wednesday's     amLODipine (NORVASC) 5 MG tablet Take 1 tablet (5 mg total) by mouth daily. 30 tablet 0   famotidine (PEPCID) 20 MG tablet Take 1 tablet (20 mg total) by mouth at bedtime. 30 tablet 3   feeding supplement (ENSURE ENLIVE / ENSURE PLUS) LIQD Take 237 mLs by mouth 2 (two)  times daily between meals. 237 mL 12   furosemide (LASIX) 40 MG tablet Take 1 tablet (40 mg total) by mouth daily as needed. Take only if 3 lbs weight gain. 30 tablet 0   insulin degludec (TRESIBA FLEXTOUCH) 100 UNIT/ML FlexTouch Pen Inject 25 Units into the skin daily before breakfast.     insulin lispro (HUMALOG) 100 UNIT/ML KwikPen Inject 7-9 Units into the skin 3 (three) times daily. Per sliding scale     Insulin Pen Needle (BD PEN NEEDLE MICRO U/F) 32G X 6 MM MISC 1 each by Other route See admin instructions. Use one pen needle to inject insulin 4 times daily. **must have appointment for additional refills.** 150 each 0   Insulin Pen Needle (BD PEN NEEDLE MICRO U/F) 32G X 6 MM MISC USE 1 PEN NEEDLE SIX TIMES DAILY (Patient taking differently: 1 each by Other route See admin instructions. USE 1 PEN NEEDLE SIX TIMES DAILY) 200 each 2   MELATONIN PO Take 30 mg by mouth at bedtime.     montelukast (SINGULAIR) 10 MG tablet Take 10 mg by mouth at bedtime.     Multiple Vitamin (MULTIVITAMIN WITH MINERALS) TABS tablet Take 1 tablet by mouth daily.     nebivolol (BYSTOLIC) 2.5 MG tablet Take 1 tablet (2.5 mg total) by mouth daily. 90 tablet 3   ondansetron (ZOFRAN) 4 MG tablet '4mg'$  every 4-6 hours as needed for nausea 30 tablet 6   pantoprazole (PROTONIX) 40 MG tablet Take 1 tablet by mouth twice daily (Patient taking differently: Take 40 mg by mouth 2 (two) times daily.) 60 tablet 11   potassium chloride SA (KLOR-CON M) 20 MEQ tablet 20 mEq daily 36 tablet 0   pregabalin (LYRICA) 100 MG capsule Take 200-300 mg by mouth See admin instructions. Take 2 capsule (200 mg) by mouth every morning and take three capsules (300 mg) at bedtime as needed for nerve pain     rosuvastatin (CRESTOR) 20 MG tablet Take 1 tablet (20 mg total) by mouth daily. 90 tablet 3   traZODone (DESYREL) 100 MG tablet Take 100 mg by mouth at bedtime.  0   No current facility-administered medications for this visit.    Allergies as  of 10/20/2021 - Review Complete 08/30/2021  Allergen Reaction Noted   Contrast media [iodinated contrast media] Itching 10/12/2019    Family History  Problem Relation Age of Onset   Breast cancer Mother    Bone cancer Mother    Melanoma Mother    Prostate cancer Father    Pancreatic cancer Maternal Grandfather    Diabetes Paternal Aunt    Colon cancer Neg Hx    Colon polyps Neg Hx    Esophageal cancer Neg Hx    Stomach cancer Neg Hx     Social History   Socioeconomic History   Marital status: Significant Other    Spouse name: Tula Nakayama- S/O   Number of children: 1   Years of education: Not on file   Highest education level: Some college, no degree  Occupational History   Occupation: retired  Tobacco Use   Smoking status: Former    Packs/day: 1.00    Years: 8.00    Total pack years: 8.00    Types: Cigarettes    Quit date: 1998    Years since quitting: 25.5   Smokeless tobacco: Never  Vaping Use   Vaping Use: Never used  Substance and Sexual Activity   Alcohol use: Yes    Alcohol/week: 4.0 standard drinks of alcohol    Types: 4 Glasses of wine per week    Comment: occasionally   Drug use: Not Currently    Frequency: 7.0 times per week    Types: Marijuana    Comment: daily   Sexual activity: Not on file  Other Topics Concern   Not on file  Social History Narrative   Not on file   Social Determinants of Health   Financial Resource Strain: High Risk (05/04/2021)   Overall Financial Resource Strain (CARDIA)    Difficulty of Paying Living Expenses: Hard  Food Insecurity: No Food Insecurity (05/04/2021)   Hunger Vital Sign    Worried About Running Out of Food in the Last Year: Never true    Ran Out of Food in the Last Year: Never true  Transportation Needs: No Transportation Needs (05/04/2021)   PRAPARE - Hydrologist (Medical): No    Lack of Transportation (Non-Medical): No  Physical Activity: Not on file  Stress: Not on file   Social Connections: Not on file  Intimate Partner Violence: Not on file    Review of Systems:    Constitutional: No weight loss, fever or chills Cardiovascular: No chest pain Respiratory: No SOB  Gastrointestinal: See HPI and otherwise negative   Physical Exam:  Vital signs: BP 110/64 (BP Location: Left Arm, Patient Position: Sitting, Cuff Size: Normal)   Pulse 60   Ht 5' 9.69" (1.77 m)   Wt 185 lb 8 oz (84.1 kg)   BMI 26.85 kg/m    Constitutional:   Pleasant Caucasian male appears to be in NAD, Well developed, Well nourished, alert and cooperative Respiratory: Respirations even and unlabored. Lungs clear to auscultation bilaterally.   No wheezes, crackles, or rhonchi.  Cardiovascular: Normal S1, S2. No MRG. Regular rate and rhythm. No peripheral edema, cyanosis or pallor.  Gastrointestinal:  Soft, nondistended, nontender. No rebound or guarding. Normal bowel sounds. No appreciable masses or hepatomegaly. Rectal:  Not performed.  Psychiatric: Demonstrates good judgement and reason without abnormal affect or behaviors.  RELEVANT LABS AND IMAGING: CBC    Component Value Date/Time   WBC 4.2 08/21/2021 1132   RBC 3.59 (L) 08/21/2021 1132  HGB 11.3 (L) 08/21/2021 1132   HCT 31.6 (L) 08/21/2021 1132   PLT 134 (L) 08/21/2021 1132   MCV 88.0 08/21/2021 1132   MCH 31.5 08/21/2021 1132   MCHC 35.8 08/21/2021 1132   RDW 12.9 08/21/2021 1132   LYMPHSABS 1.5 08/19/2021 1508   MONOABS 0.3 08/19/2021 1508   EOSABS 0.0 08/19/2021 1508   BASOSABS 0.0 08/19/2021 1508    CMP     Component Value Date/Time   NA 138 08/21/2021 1132   NA 143 07/13/2021 1051   K 4.3 08/21/2021 1132   CL 108 08/21/2021 1132   CO2 22 08/21/2021 1132   GLUCOSE 367 (H) 08/21/2021 1132   BUN 19 08/21/2021 1132   BUN 26 07/13/2021 1051   CREATININE 1.43 (H) 08/21/2021 1132   CALCIUM 8.9 08/21/2021 1132   PROT 6.5 08/20/2021 0334   ALBUMIN 3.8 08/20/2021 0334   AST 17 08/20/2021 0334   ALT 15  08/20/2021 0334   ALKPHOS 62 08/20/2021 0334   BILITOT 2.1 (H) 08/20/2021 0334   GFRNONAA 54 (L) 08/21/2021 1132   GFRAA >60 10/18/2019 0339    Assessment: 1.  Epigastric discomfort: In the setting of known gastroparesis (cannot tolerate Reglan), Pantoprazole and Pepcid seem to be helping as well as weight loss on his Mediterranean diet 2.  GERD 3.  Nausea 4.  Diarrhea: Continues with occasional episodes which resolved with Imodium; again likely IBS  Plan: 1.  Continue current medications including Pantoprazole 40 mg twice daily and Famotidine 40 mg nightly.  Did asked the patient to go home and make sure he is on the Famotidine 40 mg at night. 2.  Continue Zofran as needed.  Discussed scheduling this out but he tells me he really does not have nausea throughout the day only in the morning when he wakes up. 3.  Continue Imodium as needed for episodes of diarrhea.  Sent in a prescription for Loperamide 2 mg #30 with 11 refills to see if this is cheaper for him. 4.  Patient will follow-up with Dr. Ardis Hughs in 2 to 3 months as it has been sometime since he has seen him in clinic just to make sure that he does not have any other recommendations to make.  Ellouise Newer, PA-C [facility] 10/20/2021, 3:43 PM  Cc: [name], MD

## 2021-10-20 NOTE — Patient Instructions (Addendum)
If you are age 68 or older, your body mass index should be between 23-30. Your Body mass index is 26.85 kg/m. If this is out of the aforementioned range listed, please consider follow up with your Primary Care Provider.  If you are age 66 or younger, your body mass index should be between 19-25. Your Body mass index is 26.85 kg/m. If this is out of the aformentioned range listed, please consider follow up with your Primary Care Provider.   ________________________________________________________  The  GI providers would like to encourage you to use [facility] to communicate with providers for non-urgent requests or questions.  Due to long hold times on the telephone, sending your provider a message by [facility] may be a faster and more efficient way to get a response.  Please allow 48 business hours for a response.  Please remember that this is for non-urgent requests.  _______________________________________________________   Please check to make sure you are taking Pepcid 40 mg every night.  We have sent the following medications to your pharmacy for you to pick up at your convenience: Loperamide 2 mg once daily.  We have scheduled your follow up with Dr. Ardis Hughs for 12/19/21 at 3:40 pm  Thank you for entrusting me with your care and choosing [facility].  [employer]

## 2021-10-23 ENCOUNTER — Telehealth: Payer: Self-pay | Admitting: Physician Assistant

## 2021-10-23 NOTE — Telephone Encounter (Signed)
Patient called wants to let Ellouise Newer know he is on Famotidine 20 mg.

## 2021-10-24 NOTE — Progress Notes (Signed)
I agree with the above note, plan 

## 2021-11-13 ENCOUNTER — Telehealth: Payer: Self-pay | Admitting: Physician Assistant

## 2021-11-13 MED ORDER — FAMOTIDINE 40 MG PO TABS: 40.0000 mg | ORAL_TABLET | Freq: Every day | ORAL | 5 refills | Status: DC

## 2021-11-13 NOTE — Telephone Encounter (Signed)
PT is calling to get refill for famotidine (PEPCID). Original prescription is for 1 pill a day when it should be 2. Please reach out to advise. Thank you.

## 2021-11-13 NOTE — Telephone Encounter (Signed)
Sent in new script for Pepcid 40 mg nightly to patient's pharmacy.

## 2021-11-16 ENCOUNTER — Ambulatory Visit (INDEPENDENT_AMBULATORY_CARE_PROVIDER_SITE_OTHER): Payer: Medicare Other | Admitting: Cardiovascular Disease

## 2021-11-16 ENCOUNTER — Encounter: Payer: Self-pay | Admitting: Cardiovascular Disease

## 2021-11-16 VITALS — BP 122/78 | HR 56 | Ht 71.0 in | Wt 181.2 lb

## 2021-11-16 DIAGNOSIS — I2510 Atherosclerotic heart disease of native coronary artery without angina pectoris: Secondary | ICD-10-CM | POA: Diagnosis not present

## 2021-11-16 DIAGNOSIS — Z8639 Personal history of other endocrine, nutritional and metabolic disease: Secondary | ICD-10-CM

## 2021-11-16 DIAGNOSIS — I10 Essential (primary) hypertension: Secondary | ICD-10-CM

## 2021-11-16 DIAGNOSIS — I5032 Chronic diastolic (congestive) heart failure: Secondary | ICD-10-CM

## 2021-11-16 DIAGNOSIS — E785 Hyperlipidemia, unspecified: Secondary | ICD-10-CM | POA: Diagnosis not present

## 2021-11-16 DIAGNOSIS — N1832 Chronic kidney disease, stage 3b: Secondary | ICD-10-CM

## 2021-11-16 NOTE — Patient Instructions (Signed)
Medication Instructions:  No changes *If you need a refill on your cardiac medications before your next appointment, please call your pharmacy*   Lab Work: None ordered If you have labs (blood work) drawn today and your tests are completely normal, you will receive your results only by: [place] (if you have MyChart) OR A paper copy in the mail If you have any lab test that is abnormal or we need to change your treatment, we will call you to review the results.   Testing/Procedures: None ordered   Follow-Up: At [facility], you and your health needs are our priority.  As part of our continuing mission to provide you with exceptional heart care, we have created designated Provider Care Teams.  These Care Teams include your primary Cardiologist (physician) and Advanced Practice Providers (APPs -  Physician Assistants and Nurse Practitioners) who all work together to provide you with the care you need, when you need it.  We recommend signing up for the patient portal called "MyChart".  Sign up information is provided on this After Visit Summary.  MyChart is used to connect with patients for Virtual Visits (Telemedicine).  Patients are able to view lab/test results, encounter notes, upcoming appointments, etc.  Non-urgent messages can be sent to your provider as well.   To learn more about what you can do with MyChart, go to [email or web address].    Your next appointment:   Follow up in 6 months with [name], PA Follow up in 12 months with Dr. Sallyanne Kuster

## 2021-11-16 NOTE — Progress Notes (Signed)
Cardiology Office Note:    Date:  11/16/2021   ID:  Jesse Lane, DOB 1955/05/23, MRN [id number]  PCP:  [name], MD   [place] Providers Cardiologist:  Sanda Klein, MD     Referring MD: [name], MD   No chief complaint on file.   History of Present Illness:    Jesse Lane is a 66 y.o. male with a hx of chronic diastolic heart failure and asymptomatic single-vessel coronary artery disease, hypertension, dyslipidemia (low HDL) presenting for follow-up.  Last year he had multiple repeated hospitalizations for heart failure exacerbation, but this year he has not required hospitalization since January.  On the other hand, he was hospitalized for dehydration in May when he had an episode of protracted diarrhea.  He keeps a close eye on his weight and occasionally takes additional doses of furosemide for rapid weight gain.  He is also taking ACE inhibitor and beta-blocker.  We did try treatment with an SGLT2 inhibitor but this promptly cause dehydration.  He also has a history of gastric ulcers, GERD.  He reports issues with early morning nausea and poor food intake ever since treatment for metastatic melanoma.  His "dry weight" appears to be around 180 pounds.  He has not had problems with orthopnea PND or lower extremity edema.  He denies angina at rest or with activity.  He lives alone and is quite independent.  He denies palpitations, dizziness or syncope (he had severe weakness and near syncope when he had dehydration in May).  He has not had problems with claudication but has chronic symptoms of peripheral neuropathy.  Denies other focal neurological complaints.  Coronary CT angiography showed moderate atherosclerosis including a moderate proximal LAD stenosis of 50 to 69%, we are leaving this for medical therapy since he does not have angina pectoris.  His most recent issue profile showed LDL 73, HDL 27, triglycerides 288.  He is compliant with statin  therapy and does not have any noticeable side effects.  With weight loss, his diabetes mellitus is now under excellent control.  Most recent hemoglobin A1c was 5.4%.  Past Medical History:  Diagnosis Date   Arthritis    Diabetes mellitus type 2, uncontrolled    Diabetic neuropathy (HCC)    GERD (gastroesophageal reflux disease)    History of stomach ulcers    Hypertension    Melanoma ([facility])    mets to lymph node and intestines    Past Surgical History:  Procedure Laterality Date   ABCESS DRAINAGE     gluteal area   COLONOSCOPY     INGUINAL HERNIA REPAIR N/A 08/15/2015   Procedure: HERNIA REPAIR INGUINAL INCARCERATED;  Surgeon: Alphonsa Overall, MD;  Location: [facility];  Service: General;  Laterality: N/A;  with MESH   pyloric stenosis     TONSILLECTOMY      Current Medications: No outpatient medications have been marked as taking for the 11/16/21 encounter (Appointment) with Keenan Dimitrov, Dani Gobble, MD.     Allergies:   Contrast media [iodinated contrast media]   Social History   Socioeconomic History   Marital status: Significant Other    Spouse name: Tula Nakayama- S/O   Number of children: 1   Years of education: Not on file   Highest education level: Some college, no degree  Occupational History   Occupation: retired  Tobacco Use   Smoking status: Former    Packs/day: 1.00    Years: 8.00    Total pack years:  8.00    Types: Cigarettes    Quit date: 1998    Years since quitting: 25.6   Smokeless tobacco: Never  Vaping Use   Vaping Use: Never used  Substance and Sexual Activity   Alcohol use: Yes    Alcohol/week: 4.0 standard drinks of alcohol    Types: 4 Glasses of wine per week    Comment: occasionally   Drug use: Not Currently    Frequency: 7.0 times per week    Types: Marijuana    Comment: daily   Sexual activity: Not on file  Other Topics Concern   Not on file  Social History Narrative   Not on file   Social Determinants of Health   Financial Resource Strain:  High Risk (05/04/2021)   Overall Financial Resource Strain (CARDIA)    Difficulty of Paying Living Expenses: Hard  Food Insecurity: No Food Insecurity (05/04/2021)   Hunger Vital Sign    Worried About Running Out of Food in the Last Year: Never true    Ran Out of Food in the Last Year: Never true  Transportation Needs: No Transportation Needs (05/04/2021)   PRAPARE - Hydrologist (Medical): No    Lack of Transportation (Non-Medical): No  Physical Activity: Not on file  Stress: Not on file  Social Connections: Not on file     Family History: The patient's family history includes Bone cancer in his mother; Breast cancer in his mother; Diabetes in his paternal aunt; Melanoma in his mother; Pancreatic cancer in his maternal grandfather; Prostate cancer in his father. There is no history of Colon cancer, Colon polyps, Esophageal cancer, or Stomach cancer.  ROS:   Please see the history of present illness.     All other systems reviewed and are negative.  EKGs/Labs/Other Studies Reviewed:    The following studies were reviewed today: Coronary CT angiogram 05/19/2021 1. Mild to moderate, probably mixed calcific and non-calcific CAD, probably non-obstructive, CADRADS = 3. CT FFR will be performed and reported separately. 2. Coronary calcium score of 543. This was 82nd percentile for age and sex matched control. 3. Normal coronary origin with right dominance. 4. Dilated main pulmonary artery at 32 mm, suggestive of pulmonary hypertension 5. Aortic atherosclerosis.  1. Left Main:  No significant stenosis. FFR = 1.00 2. LAD: No significant stenosis. Proximal FFR = 0.94, Mid FFR = 0.82, Distal FFR = 0.69 3. LCX: No significant stenosis. Proximal FFR = 0.99, Distal FFR = 0.89 4. RCA: Significant stenosis. Proximal FFR = 0.99, Mid FFR = 0.77, Distal FFR = 0.75   IMPRESSION: 1. CT FFR demonstrates significant discrete stenosis of the mid RCA which is  flow-limiting.  EKG:  EKG is not ordered today.  The ekg ordered 08/19/2021 demonstrates mild sinus bradycardia, first-degree AV block (PR 285 ms), prolonged QTc interval (he was hypokalemic at the time).  Recent Labs: 08/19/2021: B Natriuretic Peptide 53.4; Magnesium 1.9 08/20/2021: ALT 15; TSH 1.145 08/21/2021: BUN 19; Creatinine, Ser 1.43; Hemoglobin 11.3; Platelets 134; Potassium 4.3; Sodium 138  Recent Lipid Panel    Component Value Date/Time   CHOL 147 06/16/2021 0837   TRIG 288 (H) 06/16/2021 0837   HDL 27 (L) 06/16/2021 0837   CHOLHDL 5.4 (H) 06/16/2021 0837   CHOLHDL 6.5 10/17/2019 0255   VLDL 37 10/17/2019 0255   LDLCALC 73 06/16/2021 0837     Risk Assessment/Calculations:           Physical Exam:    VS:  BP 122/78 (BP Location: Left Arm, Patient Position: Sitting, Cuff Size: Normal)   Pulse (!) 56   Ht '5\' 11"'$  (1.803 m)   Wt 181 lb 3.2 oz (82.2 kg)   SpO2 96%   BMI 25.27 kg/m     Wt Readings from Last 3 Encounters:  10/20/21 185 lb 8 oz (84.1 kg)  08/30/21 184 lb (83.5 kg)  08/20/21 187 lb 6.3 oz (85 kg)     GEN: Appears well, fit, well nourished, well developed in no acute distress HEENT: Normal NECK: No JVD; No carotid bruits LYMPHATICS: No lymphadenopathy CARDIAC: RRR, no murmurs, rubs, gallops RESPIRATORY:  Clear to auscultation without rales, wheezing or rhonchi  ABDOMEN: Soft, non-tender, non-distended MUSCULOSKELETAL:  No edema; No deformity  SKIN: Warm and dry NEUROLOGIC:  Alert and oriented x 3 PSYCHIATRIC:  Normal affect   ASSESSMENT:    No diagnosis found. PLAN:    In order of problems listed above:  CHF: Appears euvolemic, NYHA functional class I.  Doing a good job of self adjusting his diuretic dose.  Discussed sodium restricted diet.  Really important to keep up with his daily weights since he is at risk of both dehydration and heart failure decompensation. CAD: Asymptomatic moderate coronary disease, substantially elevated calcium  score.  The focus is on medical therapy. HLP: Very low HDL cholesterol (inherited from his father).  LDL cholesterol in target range at this time. HTN: Well-controlled. CKD 3:  Prone to AKI which has occurred both with treatment with SGLT2 inhibitors and during episode dehydration due to diarrhea.Had marked worsening of kidney function during episode of hypovolemia in May 2023.  Back to baseline creatinine which appears to be 1.5-1.6, corresponding to a GFR around 50. History type 2 DM: " Cured" with weight loss.           Medication Adjustments/Labs and Tests Ordered: Current medicines are reviewed at length with the patient today.  Concerns regarding medicines are outlined above.  No orders of the defined types were placed in this encounter.  No orders of the defined types were placed in this encounter.   There are no Patient Instructions on file for this visit.   Signed, Sanda Klein, MD  11/16/2021 9:32 AM    [place]

## 2021-12-01 ENCOUNTER — Telehealth: Payer: Self-pay | Admitting: Physician Assistant

## 2021-12-01 NOTE — Telephone Encounter (Signed)
Patient called, currently schedule with Dr Ardis Hughs on 9/12. Patient was informed Dr Ardis Hughs is not available on the 9/12. Patient requesting an appointment with Ellouise Newer instead but wants same day appointment. Was informed of schedule but patient is requesting a call from you.

## 2021-12-01 NOTE — Telephone Encounter (Signed)
Returned call to patient. I informed him that Anderson Malta does not have availability on the same day of his original appt. I told pt that we can schedule him for Jennifer's next available appt which is 01/04/22 and he can call back periodically to check for cancellations. Pt was in agreeance with this plan. His follow up has been rescheduled to Thursday, 01/04/22 at 9:30 am. Pt verbalized understanding and had no concerns at the end of the call.

## 2021-12-19 ENCOUNTER — Ambulatory Visit: Payer: Medicare Other | Admitting: Gastroenterology

## 2021-12-26 ENCOUNTER — Other Ambulatory Visit: Payer: Self-pay | Admitting: Physician Assistant

## 2022-01-03 ENCOUNTER — Other Ambulatory Visit: Payer: Self-pay | Admitting: Physician Assistant

## 2022-01-04 ENCOUNTER — Ambulatory Visit (INDEPENDENT_AMBULATORY_CARE_PROVIDER_SITE_OTHER): Payer: Medicare Other | Admitting: Physician Assistant

## 2022-01-04 ENCOUNTER — Encounter: Payer: Self-pay | Admitting: Physician Assistant

## 2022-01-04 VITALS — BP 148/82 | HR 64 | Ht 71.0 in | Wt 183.1 lb

## 2022-01-04 DIAGNOSIS — K219 Gastro-esophageal reflux disease without esophagitis: Secondary | ICD-10-CM | POA: Diagnosis not present

## 2022-01-04 DIAGNOSIS — R197 Diarrhea, unspecified: Secondary | ICD-10-CM

## 2022-01-04 DIAGNOSIS — R110 Nausea: Secondary | ICD-10-CM | POA: Diagnosis not present

## 2022-01-04 DIAGNOSIS — K3184 Gastroparesis: Secondary | ICD-10-CM | POA: Diagnosis not present

## 2022-01-04 NOTE — Patient Instructions (Signed)
We have sent the following medications to your pharmacy for you to pick up at your convenience: Famotidine 40 mg  and Loperamide.   Follow up appointment with Dr. Rush Landmark on 03/14/22 @ 11:30 am.  _______________________________________________________  If you are age 67 or older, your body mass index should be between 23-30. Your Body mass index is 25.54 kg/m. If this is out of the aforementioned range listed, please consider follow up with your Primary Care Provider.  If you are age 18 or younger, your body mass index should be between 19-25. Your Body mass index is 25.54 kg/m. If this is out of the aformentioned range listed, please consider follow up with your Primary Care Provider.   ________________________________________________________  The [facility] providers would like to encourage you to use [facility] to communicate with providers for non-urgent requests or questions.  Due to long hold times on the telephone, sending your provider a message by [facility] may be a faster and more efficient way to get a response.  Please allow 48 business hours for a response.  Please remember that this is for non-urgent requests.  _______________________________________________________

## 2022-01-04 NOTE — Progress Notes (Signed)
Attending Physician's Attestation   I have reviewed the chart.   I agree with the Advanced Practitioner's note, impression, and recommendations with any updates as below. With think about SIBO breath testing as had been previously considered as well as EPI evaluation for the diarrheal symptoms.  If things are persisting, repeat endoscopic evaluation to rule out microscopic colitis should be considered and may need an upper endoscopy to obtain biopsies to rule out enteropathies.  If celiac testing has not been performed (I cannot see it) then it should also be performed.Justice Britain, MD [facility] Advanced Endoscopy Office # [phone]

## 2022-01-04 NOTE — Progress Notes (Signed)
Chief Complaint: Follow-up nausea  HPI:    Jesse Lane is a 66 year old male with a past medical history as listed below including diabetes,stroke (10/16/2019), LVEF 65-70%, stage IV malignant melanoma with lymph node metastasis previously treated with Beryle Flock and now on surveillance imaging every 6 months and gastroparesis, known to Dr. Ardis Hughs, who returns to clinic today for follow-up of his nausea.    08/27/2019 CT the chest abdomen pelvis with contrast which showed no evidence of recurrent metastatic disease in the chest, abdomen or pelvis.  Stomach and bowel were normal.    10/16/2019-10/18/2019 patient admitted to hospital for encephalopathy due to AKI and hypertension as well as strokelike symptoms treated with IV TPA.  He was started on aspirin at discharge.  Apparently at that time discussed a history of gastric ulcers, patient was on Carafate, Prevacid and Prilosec at some point.  It was recommended he follow-up with [place].    10/17/2019 hemoglobin A1c 6.4.    12/09/2019 patient seen in clinic by me for epigastric discomfort and nausea with a history of gastric ulcers.  At that time patient discussed that he had not had a stroke recently and just presented as one after kidney failure from IV contrast.  At that time was cancer free for 2 years but continued with some nausea and episodes of vomiting.  He was scheduled for an EGD and started on pantoprazole 40 twice daily.  Also continued sucralfate.    02/15/2020 colonoscopy with diverticulosis in the entire colon, 5 2-6 mm polyps in sigmoid, transverse and descending colon and otherwise normal.  Pathology showed adenomatous polyps and repeat was recommended in 5 years.    02/15/2020 EGD with short segment, nonnodular Barrett's change classified as Barrett's stage C1-M2, gastritis and otherwise normal.  Repeat was recommended in 3 years.    06/07/2020 patient seen in clinic and described for the past couple of years he would have episodes about a week out of  the month.  Have no energy no appetite and feels very full very fast and bloated.  Ordered a gastric emptying study for further evaluation continue pantoprazole and sucralfate.  Explained that if the gastric emptying study was normal then could consider SIBO testing.    07/03/2020 gastric emptying study showed delayed gastric emptying.  He was started on Reglan 5 mg twice daily 20 to 30 minutes before breakfast and then again before dinner.    07/26/2020 patient seen in clinic and described that he had not had any severe episodes of his gastroparesis in the past past month since being on Reglan 5 mg twice daily.  He had not been needing his Carafate or pantoprazole on a regular basis.  At that time patient wanted to try a small increase in frequency of medicine to see if it helped with bloating.  Increased Reglan to 5 mg 3 times daily.    08/27/2020-08/29/2020 patient hospitalized after presenting with intractable nausea and vomiting.  At time of discharge his Reglan was increased to 10 mg 4 times daily.  CT of the abdomen pelvis without contrast during admission showed no renal or ureteral stones, aortic atherosclerosis, sigmoid diverticulosis and no acute findings in the abdomen or pelvis.    09/21/2020 patient was following up and was completely better.  At that time was taking his Metoclopramide 10 mg 3 times daily.  He requested some Zofran to have on hand.  At that time continued his Metoclopramide 10 mg 3 times daily and sent in a prescription for  Zofran.    11/28/2020 patient called in and described feeling shaky and wanted to know if it was due to his metoclopramide.  At that time recommended he discontinue Reglan to see if this is causing his shaking.    12/29/2020 patient called to discuss some diarrhea.  Abdominal x-ray was ordered to check for overflow constipation.    01/03/2021 x-ray showed large amount of stool throughout the colon consistent with constipation.  At that time recommended that he did  about MiraLAX bowel purge and then twice daily MiraLAX to keep things moving.    02/01/2021 patient called in describing severe diarrhea.  At that time had recently been diagnosed with COVID and double pneumonia.  He reported his stools have been the same since he did the bowel purge.  Initially the purge did help.  He was advised to try over-the-counter Imodium.    02/10/2021 patient seen in clinic and at that time was feeling pretty good.  He had recently been in the hospital double pneumonia and COVID and was having stools 10 times a day.  He had also stopped with hand tremors which she thought was from [place] which we have stopped back in August 2022.  At time discussed that if he had return of diarrhea then would recommend further stool testing.  Also discussed gastroparesis.  Revisited gastroparesis diet recommendations.    08/19/2021-08/21/2021 patient admitted for AKI.  He had been having diarrhea and had used Imodium and eventually the diarrhea resolved.  And he became very lightheaded so he came to the ED.  Creatinine was 2.35 from baseline around 1.3-1.4.  It improved to 1.43.  CT abdomen pelvis without contrast that admission showed no acute findings.    08/30/2021 patient described that his biggest complaint was that he would wake up in the morning and just feel discomfort in his abdomen.  Also decreased appetite.  He had been taking his Pantoprazole 40 mg sometimes 3 times a day which may be helped a little bit as well as Zofran.  He had lost 10 to 15 pounds on the Mediterranean diet.  At that time I explained that he should take his Pantoprazole 40 mg twice a day schedule before his first meal and last and we added Famotidine 40 mg nightly.  Also refilled Zofran 4 mg every 4-6 hours.  Continue Imodium as needed for diarrhea.  Again discussed that he could not tolerate Reglan and likely his gastroparesis was exacerbating symptoms.    10/20/2021 patient seen in clinic and at that time since taking his  Pantoprazole correctly he was doing some better.  His typical schedule that he would wake up around 4 5 in the morning and take Pantoprazole and Zofran and eat a little something and then he would lay back down and have no nausea or abdominal pain the rest of the day.  He had been "burping a lot".  He was continue to lose weight on the Mediterranean diet and felt like it was helping his symptoms.  Still had occasional episodes of diarrhea and would use Imodium.  Overall things are stable.  At that time continued Pantoprazole 40 twice daily and Famotidine nightly.  Continue Zofran as needed.  Continue Imodium as needed for diarrhea.  Sent in a prescription.  Patient told to follow with Dr. Ardis Hughs in 2 to 3 months.    Today, the patient tells me unfortunately his appointment with Dr. Ardis Hughs was canceled and he is here for follow-up.  His symptoms seem  to be stable as long as he takes Loperamide and Pantoprazole 40 mg in the morning.  He will then use another Loperamide 1 other time in the afternoon and this seems to keep his diarrhea at [place] but if he forgets a dose then he typically has problems.  Also takes another dose of Pantoprazole before dinner.  He also uses Famotidine 40 mg in the evening.  Tells me though that he would really like to make sure that everything is going well and that were not missing anything.  In general though he has more good days than bad.    Denies fever, chills, weight loss, blood in his stool or symptoms that awaken him from sleep.  Past Medical History:  Diagnosis Date   Arthritis    Diabetes mellitus type 2, uncontrolled    Diabetic neuropathy (HCC)    GERD (gastroesophageal reflux disease)    History of stomach ulcers    Hypertension    Melanoma ([place])    mets to lymph node and intestines    Past Surgical History:  Procedure Laterality Date   ABCESS DRAINAGE     gluteal area   COLONOSCOPY     INGUINAL HERNIA REPAIR N/A 08/15/2015   Procedure: HERNIA REPAIR INGUINAL  INCARCERATED;  Surgeon: Alphonsa Overall, MD;  Location: [facility];  Service: General;  Laterality: N/A;  with MESH   pyloric stenosis     TONSILLECTOMY      Current Outpatient Medications  Medication Sig Dispense Refill   acetaminophen (TYLENOL) 500 MG tablet Take 1,000 mg by mouth every 6 (six) hours as needed for mild pain. (Patient not taking: Reported on 11/16/2021)     alendronate (FOSAMAX) 70 MG tablet Take 70 mg by mouth once a week. Wednesday's (Patient not taking: Reported on 11/16/2021)     amLODipine (NORVASC) 10 MG tablet Take 10 mg by mouth daily.     famotidine (PEPCID) 40 MG tablet Take 1 tablet (40 mg total) by mouth at bedtime. 30 tablet 5   feeding supplement (ENSURE ENLIVE / ENSURE PLUS) LIQD Take 237 mLs by mouth 2 (two) times daily between meals. (Patient not taking: Reported on 11/16/2021) 237 mL 12   furosemide (LASIX) 80 MG tablet Take 80 mg by mouth daily.     insulin degludec (TRESIBA FLEXTOUCH) 100 UNIT/ML FlexTouch Pen Inject 25 Units into the skin daily before breakfast.     insulin lispro (HUMALOG) 100 UNIT/ML KwikPen Inject 7-9 Units into the skin 3 (three) times daily. Per sliding scale     Insulin Pen Needle (BD PEN NEEDLE MICRO U/F) 32G X 6 MM MISC 1 each by Other route See admin instructions. Use one pen needle to inject insulin 4 times daily. **must have appointment for additional refills.** 150 each 0   Insulin Pen Needle (BD PEN NEEDLE MICRO U/F) 32G X 6 MM MISC USE 1 PEN NEEDLE SIX TIMES DAILY (Patient taking differently: 1 each by Other route See admin instructions. USE 1 PEN NEEDLE SIX TIMES DAILY) 200 each 2   lisinopril (ZESTRIL) 40 MG tablet Take 40 mg by mouth daily.     loperamide (IMODIUM) 2 MG capsule Take 1 capsule by mouth once daily 30 capsule 0   MELATONIN PO Take 30 mg by mouth at bedtime.     montelukast (SINGULAIR) 10 MG tablet Take 10 mg by mouth at bedtime.     Multiple Vitamin (MULTIVITAMIN WITH MINERALS) TABS tablet Take 1 tablet by mouth daily.  nebivolol (BYSTOLIC) 2.5 MG tablet Take 1 tablet (2.5 mg total) by mouth daily. 90 tablet 3   ondansetron (ZOFRAN) 4 MG tablet TAKE 1 TABLET BY MOUTH EVERY 4 TO 6 HOURS AS NEEDED FOR NAUSEA 90 tablet 0   pantoprazole (PROTONIX) 40 MG tablet Take 1 tablet by mouth twice daily (Patient taking differently: Take 40 mg by mouth 2 (two) times daily.) 60 tablet 11   potassium chloride SA (KLOR-CON M) 20 MEQ tablet 20 mEq daily 36 tablet 0   pregabalin (LYRICA) 100 MG capsule Take 200-300 mg by mouth See admin instructions. Take 2 capsule (200 mg) by mouth every morning and take three capsules (300 mg) at bedtime as needed for nerve pain (Patient not taking: Reported on 11/16/2021)     rosuvastatin (CRESTOR) 20 MG tablet Take 1 tablet (20 mg total) by mouth daily. 90 tablet 3   sertraline (ZOLOFT) 50 MG tablet Take 50 mg by mouth daily.     traZODone (DESYREL) 100 MG tablet Take 100 mg by mouth at bedtime.  0   No current facility-administered medications for this visit.    Allergies as of 01/04/2022 - Review Complete 11/16/2021  Allergen Reaction Noted   Contrast media [iodinated contrast media] Itching 10/12/2019    Family History  Problem Relation Age of Onset   Breast cancer Mother    Bone cancer Mother    Melanoma Mother    Prostate cancer Father    Pancreatic cancer Maternal Grandfather    Diabetes Paternal Aunt    Colon cancer Neg Hx    Colon polyps Neg Hx    Esophageal cancer Neg Hx    Stomach cancer Neg Hx     Social History   Socioeconomic History   Marital status: Significant Other    Spouse name: Tula Nakayama- S/O   Number of children: 1   Years of education: Not on file   Highest education level: Some college, no degree  Occupational History   Occupation: retired  Tobacco Use   Smoking status: Former    Packs/day: 1.00    Years: 8.00    Total pack years: 8.00    Types: Cigarettes    Quit date: 1998    Years since quitting: 25.7   Smokeless tobacco: Never   Vaping Use   Vaping Use: Never used  Substance and Sexual Activity   Alcohol use: Yes    Alcohol/week: 4.0 standard drinks of alcohol    Types: 4 Glasses of wine per week    Comment: occasionally   Drug use: Not Currently    Frequency: 7.0 times per week    Types: Marijuana    Comment: daily   Sexual activity: Not on file  Other Topics Concern   Not on file  Social History Narrative   Not on file   Social Determinants of Health   Financial Resource Strain: High Risk (05/04/2021)   Overall Financial Resource Strain (CARDIA)    Difficulty of Paying Living Expenses: Hard  Food Insecurity: No Food Insecurity (05/04/2021)   Hunger Vital Sign    Worried About Running Out of Food in the Last Year: Never true    Ran Out of Food in the Last Year: Never true  Transportation Needs: No Transportation Needs (05/04/2021)   PRAPARE - Hydrologist (Medical): No    Lack of Transportation (Non-Medical): No  Physical Activity: Not on file  Stress: Not on file  Social Connections: Not on file  Intimate  Partner Violence: Not on file    Review of Systems:    Constitutional: No weight loss, fever or chills Cardiovascular: No chest pain  Respiratory: No SOB  Gastrointestinal: See HPI and otherwise negative   Physical Exam:  Vital signs: Blood pressure (!) 148/82, pulse 64, height '5\' 11"'$  (1.803 m), weight 183 lb 2 oz (83.1 kg), SpO2 99 %.   Constitutional:   Very Pleasant Caucasian male appears to be in NAD, Well developed, Well nourished, alert and cooperative Respiratory: Respirations even and unlabored. Lungs clear to auscultation bilaterally.   No wheezes, crackles, or rhonchi.  Cardiovascular: Normal S1, S2. No MRG. Regular rate and rhythm. No peripheral edema, cyanosis or pallor.  Gastrointestinal:  Soft, nondistended, nontender. No rebound or guarding. Normal bowel sounds. No appreciable masses or hepatomegaly. Rectal:  Not performed.  Psychiatric: Oriented  to person, place and time. Demonstrates good judgement and reason without abnormal affect or behaviors.  RELEVANT LABS AND IMAGING: CBC    Component Value Date/Time   WBC 4.2 08/21/2021 1132   RBC 3.59 (L) 08/21/2021 1132   HGB 11.3 (L) 08/21/2021 1132   HCT 31.6 (L) 08/21/2021 1132   PLT 134 (L) 08/21/2021 1132   MCV 88.0 08/21/2021 1132   MCH 31.5 08/21/2021 1132   MCHC 35.8 08/21/2021 1132   RDW 12.9 08/21/2021 1132   LYMPHSABS 1.5 08/19/2021 1508   MONOABS 0.3 08/19/2021 1508   EOSABS 0.0 08/19/2021 1508   BASOSABS 0.0 08/19/2021 1508    CMP     Component Value Date/Time   NA 138 08/21/2021 1132   NA 143 07/13/2021 1051   K 4.3 08/21/2021 1132   CL 108 08/21/2021 1132   CO2 22 08/21/2021 1132   GLUCOSE 367 (H) 08/21/2021 1132   BUN 19 08/21/2021 1132   BUN 26 07/13/2021 1051   CREATININE 1.43 (H) 08/21/2021 1132   CALCIUM 8.9 08/21/2021 1132   PROT 6.5 08/20/2021 0334   ALBUMIN 3.8 08/20/2021 0334   AST 17 08/20/2021 0334   ALT 15 08/20/2021 0334   ALKPHOS 62 08/20/2021 0334   BILITOT 2.1 (H) 08/20/2021 0334   GFRNONAA 54 (L) 08/21/2021 1132   GFRAA >60 10/18/2019 0339    Assessment: 1.  Epigastric discomfort: Seems better on Pantoprazole and Pepcid as well as a Mediterranean diet, cannot tolerate Reglan for his gastroparesis 2.  GERD 3.  Nausea 4.  Diarrhea: Continues with episodes, typically better with Loperamide twice a day; thought to likely be IBS  Plan: 1.  Continue current medications including Pantoprazole 40 mg twice daily and Famotidine 40 mg at night.  Famotidine was refilled #30 with 11 refills. 2.  Continue Loperamide 2 mg twice daily, #60 with 11 refills. 3.  We discussed again that I think it would be good for him to see one of our physicians here to make sure are not missing anything.  Patient is ready to even have a fresh work-up if needed to make sure that we are treating what needs to be treated and not missing anything.  I recommended that  he see Dr. Rush Landmark here as were not sure when Dr. Ardis Hughs will return. 4.  Patient was scheduled an appointment with Dr. Rush Landmark at his next available in December.  Ellouise Newer, PA-C [facility] 01/04/2022, 9:28 AM  Cc: Jesse Rasmussen, MD

## 2022-01-19 ENCOUNTER — Other Ambulatory Visit: Payer: Self-pay | Admitting: Physician Assistant

## 2022-01-22 ENCOUNTER — Telehealth: Payer: Self-pay | Admitting: Physician Assistant

## 2022-01-22 MED ORDER — FAMOTIDINE 40 MG PO TABS
40.0000 mg | ORAL_TABLET | Freq: Two times a day (BID) | ORAL | 5 refills | Status: DC
Start: 2022-01-22 — End: 2022-07-09

## 2022-01-22 NOTE — Telephone Encounter (Signed)
Updated script sent.

## 2022-01-22 NOTE — Telephone Encounter (Signed)
Inbound call from patient stating that he is supposed to be taking  PEPCID once in the morning and once at night but his prescription is only for once a day. Patient is requesting that it be resent to the pharmacy as twice daily. Please advise.

## 2022-02-26 ENCOUNTER — Telehealth: Payer: Self-pay | Admitting: Physician Assistant

## 2022-02-26 MED ORDER — LOPERAMIDE HCL 2 MG PO CAPS: 2.0000 mg | ORAL_CAPSULE | Freq: Every day | ORAL | 2 refills | Status: DC

## 2022-02-26 NOTE — Telephone Encounter (Signed)
Inbound call from patient stating he needs a refill for Imodium. Please advise.

## 2022-02-26 NOTE — Telephone Encounter (Signed)
Imodium refilled as requested. Up to date on visits.

## 2022-03-14 ENCOUNTER — Encounter: Payer: Self-pay | Admitting: Gastroenterology

## 2022-03-14 ENCOUNTER — Ambulatory Visit (INDEPENDENT_AMBULATORY_CARE_PROVIDER_SITE_OTHER): Payer: Medicare Other | Admitting: Gastroenterology

## 2022-03-14 VITALS — BP 126/78 | HR 55 | Ht 71.0 in | Wt 180.0 lb

## 2022-03-14 DIAGNOSIS — K2270 Barrett's esophagus without dysplasia: Secondary | ICD-10-CM | POA: Diagnosis not present

## 2022-03-14 DIAGNOSIS — K529 Noninfective gastroenteritis and colitis, unspecified: Secondary | ICD-10-CM

## 2022-03-14 DIAGNOSIS — Z8711 Personal history of peptic ulcer disease: Secondary | ICD-10-CM | POA: Diagnosis not present

## 2022-03-14 DIAGNOSIS — R110 Nausea: Secondary | ICD-10-CM | POA: Diagnosis not present

## 2022-03-14 NOTE — Patient Instructions (Addendum)
You have been given a testing kit to check for small intestine bacterial overgrowth (SIBO) which is completed by a company named [facility]. Make sure to return your test in the mail using the return mailing label given to you along with the kit. Your demographic and insurance information have already been sent to the company and they should be in contact with you over the next 1-2 weeks regarding this test. [facility] will collect an upfront charge of $99.74 for commercial insurance plans and $209.74 is you are paying cash. Make sure to discuss with [facility] PRIOR to having the test to see if they have gotten information from your insurance company as to how much your testing will cost out of pocket, if any. Please keep in mind that you will be getting a call from phone number [phone] or a similar number. If you do not hear from them within this time frame, please call our office at [phone] or call [facility] directly at [phone].   Your provider has requested that you go to the basement level for lab work before leaving today. Press "B" on the elevator. The lab is located at the first door on the left as you exit the elevator.  Due to recent changes in healthcare laws, you may see the results of your imaging and laboratory studies on MyChart before your provider has had a chance to review them.  We understand that in some cases there may be results that are confusing or concerning to you. Not all laboratory results come back in the same time frame and the provider may be waiting for multiple results in order to interpret others.  Please give [place] 48 hours in order for your provider to thoroughly review all the results before contacting the office for clarification of your results.   Thank you for choosing me and [facility].  Dr. Rush Landmark

## 2022-03-15 ENCOUNTER — Other Ambulatory Visit: Payer: Medicare Other

## 2022-03-18 DIAGNOSIS — Z8711 Personal history of peptic ulcer disease: Secondary | ICD-10-CM | POA: Insufficient documentation

## 2022-03-18 DIAGNOSIS — K529 Noninfective gastroenteritis and colitis, unspecified: Secondary | ICD-10-CM | POA: Insufficient documentation

## 2022-03-18 DIAGNOSIS — R110 Nausea: Secondary | ICD-10-CM | POA: Insufficient documentation

## 2022-03-18 DIAGNOSIS — K2270 Barrett's esophagus without dysplasia: Secondary | ICD-10-CM | POA: Insufficient documentation

## 2022-03-18 NOTE — Progress Notes (Signed)
[place] VISIT   Primary Care Provider [name], MD [address] [address] [phone]  Patient Profile: Jesse Lane is a 66 y.o. male with a pmh significant for CVA, malignant melanoma, diabetes, hypertension, hyperlipidemia, prior PUD, Barrett's esophagus, GERD.  The patient presents to the [facility] for an evaluation and management of problem(s) noted below:  Problem List 1. Chronic diarrhea   2. Nausea   3. Barrett's esophagus without dysplasia   4. History of peptic ulcer     History of Present Illness Please see prior notes for full details of HPI.  Interval History The patient states that overall his symptoms have been stable.  He continues to use loperamide on an as-needed basis for his bowel habits.  He uses Protonix and occasionally will use Pepcid but overall has been doing well.  There are times where he may need to take an extra dose of Imodium.  No other changes in his GI symptoms currently.  GI Review of Systems Positive as above Negative for dysphagia, odynophagia, melena, hematochezia  Review of Systems General: Denies fevers/chills/weight loss unintentionally Cardiovascular: Denies chest pain Pulmonary: Denies shortness of breath Gastroenterological: See HPI Genitourinary: Denies darkened urine  Hematological: Denies easy bruising/bleeding Endocrine: Denies temperature intolerance Dermatological: Denies jaundice Psychological: Mood is stable   Medications Current Outpatient Medications  Medication Sig Dispense Refill   acetaminophen (TYLENOL) 500 MG tablet Take 1,000 mg by mouth every 6 (six) hours as needed for mild pain.     amLODipine (NORVASC) 10 MG tablet Take 10 mg by mouth daily.     famotidine (PEPCID) 40 MG tablet Take 1 tablet (40 mg total) by mouth 2 (two) times daily. 60 tablet 5   furosemide (LASIX) 80 MG tablet Take 80 mg by mouth daily.     insulin  degludec (TRESIBA FLEXTOUCH) 100 UNIT/ML FlexTouch Pen Inject 25 Units into the skin daily before breakfast.     insulin lispro (HUMALOG) 100 UNIT/ML KwikPen Inject 7-9 Units into the skin 3 (three) times daily. Per sliding scale     Insulin Pen Needle (BD PEN NEEDLE MICRO U/F) 32G X 6 MM MISC 1 each by Other route See admin instructions. Use one pen needle to inject insulin 4 times daily. **must have appointment for additional refills.** 150 each 0   Insulin Pen Needle (BD PEN NEEDLE MICRO U/F) 32G X 6 MM MISC USE 1 PEN NEEDLE SIX TIMES DAILY (Patient taking differently: 1 each by Other route See admin instructions. USE 1 PEN NEEDLE SIX TIMES DAILY) 200 each 2   lisinopril (ZESTRIL) 40 MG tablet Take 40 mg by mouth daily.     loperamide (IMODIUM) 2 MG capsule Take 1 capsule (2 mg total) by mouth daily. 30 capsule 2   MELATONIN PO Take 30 mg by mouth at bedtime.     montelukast (SINGULAIR) 10 MG tablet Take 10 mg by mouth at bedtime.     Multiple Vitamin (MULTIVITAMIN WITH MINERALS) TABS tablet Take 1 tablet by mouth daily.     nebivolol (BYSTOLIC) 2.5 MG tablet Take 1 tablet (2.5 mg total) by mouth daily. 90 tablet 3   ondansetron (ZOFRAN) 4 MG tablet TAKE 1 TABLET BY MOUTH EVERY 4 TO 6 HOURS AS NEEDED FOR NAUSEA 90 tablet 0   pantoprazole (PROTONIX) 40 MG tablet Take 1 tablet by mouth twice daily (Patient taking differently: Take 40 mg by mouth 2 (two) times daily.) 60 tablet 11  potassium chloride SA (KLOR-CON M) 20 MEQ tablet 20 mEq daily 36 tablet 0   pregabalin (LYRICA) 100 MG capsule Take 200-300 mg by mouth See admin instructions. Take 2 capsule (200 mg) by mouth every morning and take three capsules (300 mg) at bedtime as needed for nerve pain     sertraline (ZOLOFT) 50 MG tablet Take 50 mg by mouth daily.     traZODone (DESYREL) 100 MG tablet Take 100 mg by mouth at bedtime.  0   alendronate (FOSAMAX) 70 MG tablet Take 70 mg by mouth once a week. Wednesday's (Patient not taking: Reported  on 01/04/2022)     feeding supplement (ENSURE ENLIVE / ENSURE PLUS) LIQD Take 237 mLs by mouth 2 (two) times daily between meals. (Patient not taking: Reported on 01/04/2022) 237 mL 12   rosuvastatin (CRESTOR) 20 MG tablet Take 1 tablet (20 mg total) by mouth daily. 90 tablet 3   No current facility-administered medications for this visit.    Allergies Allergies  Allergen Reactions   Contrast Media [Iodinated Contrast Media] Itching    Histories Past Medical History:  Diagnosis Date   Arthritis    Diabetes mellitus type 2, uncontrolled    Diabetic neuropathy (HCC)    GERD (gastroesophageal reflux disease)    History of stomach ulcers    Hypertension    Melanoma ([place])    mets to lymph node and intestines   Past Surgical History:  Procedure Laterality Date   ABCESS DRAINAGE     gluteal area   COLONOSCOPY     INGUINAL HERNIA REPAIR N/A 08/15/2015   Procedure: HERNIA REPAIR INGUINAL INCARCERATED;  Surgeon: Alphonsa Overall, MD;  Location: [facility];  Service: General;  Laterality: N/A;  with MESH   pyloric stenosis     TONSILLECTOMY     Social History   Socioeconomic History   Marital status: Significant Other    Spouse name: Tula Nakayama- S/O   Number of children: 1   Years of education: Not on file   Highest education level: Some college, no degree  Occupational History   Occupation: retired  Tobacco Use   Smoking status: Former    Packs/day: 1.00    Years: 8.00    Total pack years: 8.00    Types: Cigarettes    Quit date: 1998    Years since quitting: 25.9   Smokeless tobacco: Never  Vaping Use   Vaping Use: Never used  Substance and Sexual Activity   Alcohol use: Yes    Alcohol/week: 4.0 standard drinks of alcohol    Types: 4 Glasses of wine per week    Comment: occasionally   Drug use: Not Currently    Frequency: 7.0 times per week    Types: Marijuana    Comment: daily   Sexual activity: Not on file  Other Topics Concern   Not on file  Social History  Narrative   Not on file   Social Determinants of Health   Financial Resource Strain: High Risk (05/04/2021)   Overall Financial Resource Strain (CARDIA)    Difficulty of Paying Living Expenses: Hard  Food Insecurity: No Food Insecurity (05/04/2021)   Hunger Vital Sign    Worried About Running Out of Food in the Last Year: Never true    Ran Out of Food in the Last Year: Never true  Transportation Needs: No Transportation Needs (05/04/2021)   PRAPARE - Hydrologist (Medical): No    Lack of Transportation (Non-Medical): No  Physical Activity: Not on file  Stress: Not on file  Social Connections: Not on file  Intimate Partner Violence: Not on file   Family History  Problem Relation Age of Onset   Breast cancer Mother    Bone cancer Mother    Melanoma Mother    Prostate cancer Father    Pancreatic cancer Maternal Grandfather    Diabetes Paternal Aunt    Colon cancer Neg Hx    Colon polyps Neg Hx    Esophageal cancer Neg Hx    Stomach cancer Neg Hx    I have reviewed his medical, social, and family history in detail and updated the electronic medical record as necessary.    PHYSICAL EXAMINATION  BP 126/78   Pulse (!) 55   Ht '5\' 11"'$  (1.803 m)   Wt 180 lb (81.6 kg)   SpO2 97%   BMI 25.10 kg/m  Wt Readings from Last 3 Encounters:  03/14/22 180 lb (81.6 kg)  01/04/22 183 lb 2 oz (83.1 kg)  11/16/21 181 lb 3.2 oz (82.2 kg)  GEN: NAD, appears stated age, doesn't appear chronically ill PSYCH: Cooperative, without pressured speech EYE: Conjunctivae pink, sclerae anicteric ENT: MMM CV: Nontachycardic RESP: No audible wheezing GI: NABS, soft, NT/ND, without rebound or guarding MSK/EXT: No significant lower extremity edema SKIN: No jaundice NEURO:  Alert & Oriented x 3, no focal deficits   REVIEW OF DATA  I reviewed the following data at the time of this encounter:  GI Procedures and Studies  No new studies to review  Laboratory Studies   Reviewed those in epic  Imaging Studies  May 2023 CT abdomen pelvis without contrast IMPRESSION: No acute findings in the abdomen or pelvis.   ASSESSMENT  Mr. Dyal is a 66 y.o. male with a pmh significant for CVA, malignant melanoma, diabetes, hypertension, hyperlipidemia, prior PUD, Barrett's esophagus, GERD.  The patient is seen today for evaluation and management of:  1. Chronic diarrhea   2. Nausea   3. Barrett's esophagus without dysplasia   4. History of peptic ulcer    The patient is hemodynamically stable.  Clinically he seems to be at his baseline.  We did discuss further evaluation for EPI as well as SIBO and we will move forward with these to rule out other etiologies for his more significant chronic diarrhea.  May need to consider repeat colonoscopic evaluation as well to ensure microscopic/collagenous colitis is not playing a role.  He will be due for colon polyp surveillance in 2024 and we will keep this in mind.  Lets see what we find with this additional workup.  He will need follow-up endoscopy for Barrett's esophagus surveillance in 2024 as well.  Will see him back in clinic in 2024.  All patient questions were answered to the best of my ability, and the patient agrees to the aforementioned plan of action with follow-up as indicated.   PLAN  EPI evaluation to be pursued SIBO breath testing to be pursued Consider diagnostic colonoscopy biopsies for rule out microscopic/collagenous colitis to be done at same time as surveillance colonoscopy in 2024 EGD for Barrett's surveillance in 2024 Celiac testing should be pursued if above workup is negative   Orders Placed This Encounter  Procedures   Pancreatic elastase, fecal    New Prescriptions   No medications on file   Modified Medications   No medications on file    Planned Follow Up Return in about 2 months (around 05/15/2022).   Total Time  in [place] and in [place] for patient including  independent/personal interpretation/review of prior testing, medical history, examination, medication adjustment, communicating results with the patient directly, and documentation within the EHR is 25 minutes.   Justice Britain, MD [facility] Office # [phone]

## 2022-03-19 ENCOUNTER — Other Ambulatory Visit: Payer: Medicare Other

## 2022-03-19 DIAGNOSIS — K529 Noninfective gastroenteritis and colitis, unspecified: Secondary | ICD-10-CM

## 2022-03-26 LAB — PANCREATIC ELASTASE, FECAL: Pancreatic Elastase-1, Stool: 338 mcg/g

## 2022-03-28 ENCOUNTER — Telehealth: Payer: Self-pay | Admitting: Gastroenterology

## 2022-03-28 NOTE — Telephone Encounter (Signed)
Incoming call from patient states he found the kit you guys has spoke about.

## 2022-03-28 NOTE — Telephone Encounter (Signed)
Noted the pt will complete as soon as able.

## 2022-04-04 ENCOUNTER — Other Ambulatory Visit: Payer: Self-pay | Admitting: Physician Assistant

## 2022-04-04 NOTE — Telephone Encounter (Signed)
Patient called requesting a prescription for Loperamide.  To be sent to [facility].

## 2022-05-16 DIAGNOSIS — H2512 Age-related nuclear cataract, left eye: Secondary | ICD-10-CM | POA: Insufficient documentation

## 2022-05-22 NOTE — Progress Notes (Unsigned)
Cardiology Office Note:    Date:  05/23/2022   ID:  Jesse Lane, DOB 1955/10/11, MRN [id number]  PCP:  Jesse Rasmussen, MD Jesse Lane Cardiologist: Jesse Klein, MD   Reason for visit: 6 month follow-up  History of Present Illness:    Jesse Lane is a 67 y.o. male with a hx of  chronic diastolic heart failure, diabetes, hypertension and chronic kidney disease.  He also has a history of gastric ulcers, GERD.  He reports issues with early morning nausea and poor food intake ever since treatment for metastatic melanoma.    With coronary calcification on CT scan, he was scheduled for coronary CTA.  CTA showed a significant stenosis in the mid right coronary artery as well as widespread coronary atherosclerosis with multiple areas of mild-moderate disease and a coronary calcium score that places him in the 82nd percentile.  With no angina and preserved EF, he is treated medically with a focus on risk factor modification.   In March 2023, with dizziness, weakness, AKI and 10 lb weight loss, Farxiga discontinued and Lasix decreased to 40 mg daily.  Follow-up in April 2023 he was euvolemic.  Patient requested nutrition referral.  Dr. Sallyanne Lane in August 2023.  At that time estimated dry weight was 180 pounds.  Today, patient states he is doing well from a fluid standpoint.  He saw his endocrinologist who was restarted Farxiga at 5 mg daily and stopped his Lasix.  He mentions he had 1 episode where he felt dehydrated a couple days ago with lightheadedness with standing.  He made sure to aggressively hydrate.  He states his current dry weight is 170 pounds.  He denies lower extremity edema, PND, orthopnea, palpitations and chest pain.  He states he has shortness of breath with moderate exertion that is unchanged.  His biggest concern today is elevated blood pressure in the last month and a half.  He states he is gone over 3 cups and has intermittently high pressures up to 200 mmHg.   When it is very high, he has been taking extra amlodipine or lisinopril.  He has seen his PCP for this who recently prescribed a muscle relaxer.  Patient mentions issues with sleep.  Sees PCP again tomorrow.   Past Medical History:  Diagnosis Date   Arthritis    Diabetes mellitus type 2, uncontrolled    Diabetic neuropathy (HCC)    GERD (gastroesophageal reflux disease)    History of stomach ulcers    Hypertension    Melanoma ([place])    mets to lymph node and intestines    Past Surgical History:  Procedure Laterality Date   ABCESS DRAINAGE     gluteal area   COLONOSCOPY     INGUINAL HERNIA REPAIR N/A 08/15/2015   Procedure: HERNIA REPAIR INGUINAL INCARCERATED;  Surgeon: Jesse Overall, MD;  Location: [facility];  Service: General;  Laterality: N/A;  with MESH   pyloric stenosis     TONSILLECTOMY      Current Medications: Current Meds  Medication Sig   acetaminophen (TYLENOL) 500 MG tablet Take 1,000 mg by mouth every 6 (six) hours as needed for mild pain.   alendronate (FOSAMAX) 70 MG tablet Take 70 mg by mouth once a week. Wednesday's   amLODipine (NORVASC) 10 MG tablet Take 10 mg by mouth daily.   cyclobenzaprine (FLEXERIL) 10 MG tablet Take 10 mg by mouth at bedtime as needed.   famotidine (PEPCID) 40 MG tablet Take 1 tablet (40 mg  total) by mouth 2 (two) times daily.   FARXIGA 5 MG TABS tablet Take 5 mg by mouth daily.   feeding supplement (ENSURE ENLIVE / ENSURE PLUS) LIQD Take 237 mLs by mouth 2 (two) times daily between meals.   furosemide (LASIX) 80 MG tablet Take 80 mg by mouth daily.   insulin degludec (TRESIBA FLEXTOUCH) 100 UNIT/ML FlexTouch Pen Inject 25 Units into the skin daily before breakfast.   insulin lispro (HUMALOG) 100 UNIT/ML KwikPen Inject 7-9 Units into the skin 3 (three) times daily. Per sliding scale   Insulin Pen Needle (BD PEN NEEDLE MICRO U/F) 32G X 6 MM MISC 1 each by Other route See admin instructions. Use one pen needle to inject insulin 4 times daily.  **must have appointment for additional refills.**   Insulin Pen Needle (BD PEN NEEDLE MICRO U/F) 32G X 6 MM MISC USE 1 PEN NEEDLE SIX TIMES DAILY (Patient taking differently: 1 each by Other route See admin instructions. USE 1 PEN NEEDLE SIX TIMES DAILY)   lisinopril (ZESTRIL) 40 MG tablet Take 40 mg by mouth daily.   loperamide (IMODIUM) 2 MG capsule Take 1 capsule by mouth once daily   MELATONIN PO Take 30 mg by mouth at bedtime.   montelukast (SINGULAIR) 10 MG tablet Take 10 mg by mouth at bedtime.   Multiple Vitamin (MULTIVITAMIN WITH MINERALS) TABS tablet Take 1 tablet by mouth daily.   nebivolol (BYSTOLIC) 5 MG tablet Take 1 tablet (5 mg total) by mouth every morning.   ondansetron (ZOFRAN) 4 MG tablet TAKE 1 TABLET BY MOUTH EVERY 4 TO 6 HOURS AS NEEDED FOR NAUSEA   pantoprazole (PROTONIX) 40 MG tablet Take 1 tablet by mouth twice daily (Patient taking differently: Take 40 mg by mouth 2 (two) times daily.)   potassium chloride SA (KLOR-CON M) 20 MEQ tablet 20 mEq daily   prazosin (MINIPRESS) 1 MG capsule Take 1 mg by mouth at bedtime.   pregabalin (LYRICA) 100 MG capsule Take 200-300 mg by mouth See admin instructions. Take 2 capsule (200 mg) by mouth every morning and take three capsules (300 mg) at bedtime as needed for nerve pain   sertraline (ZOLOFT) 50 MG tablet Take 50 mg by mouth daily.   traZODone (DESYREL) 100 MG tablet Take 100 mg by mouth at bedtime.   [DISCONTINUED] nebivolol (BYSTOLIC) 2.5 MG tablet Take 1 tablet (2.5 mg total) by mouth daily.     Allergies:   Contrast media [iodinated contrast media]   Social History   Socioeconomic History   Marital status: Significant Other    Spouse name: Jesse Lane- S/O   Number of children: 1   Years of education: Not on file   Highest education level: Some college, no degree  Occupational History   Occupation: retired  Tobacco Use   Smoking status: Former    Packs/day: 1.00    Years: 8.00    Total pack years: 8.00     Types: Cigarettes    Quit date: 1998    Years since quitting: 26.1   Smokeless tobacco: Never  Vaping Use   Vaping Use: Never used  Substance and Sexual Activity   Alcohol use: Yes    Alcohol/week: 4.0 standard drinks of alcohol    Types: 4 Glasses of wine per week    Comment: occasionally   Drug use: Not Currently    Frequency: 7.0 times per week    Types: Marijuana    Comment: daily   Sexual activity: Not on file  Other Topics Concern   Not on file  Social History Narrative   Not on file   Social Determinants of Health   Financial Resource Strain: High Risk (05/04/2021)   [place] Financial Resource Strain (CARDIA)    Difficulty of Paying Living Expenses: Hard  Food Insecurity: No Food Insecurity (05/04/2021)   Hunger Vital Sign    Worried About Running Out of Food in the Last Year: Never true    Ran Out of Food in the Last Year: Never true  Transportation Needs: No Transportation Needs (05/04/2021)   PRAPARE - Hydrologist (Medical): No    Lack of Transportation (Non-Medical): No  Physical Activity: Not on file  Stress: Not on file  Social Connections: Not on file     Family History: The patient's family history includes Bone cancer in his mother; Breast cancer in his mother; Diabetes in his paternal aunt; Melanoma in his mother; Pancreatic cancer in his maternal grandfather; Prostate cancer in his father. There is no history of Colon cancer, Colon polyps, Esophageal cancer, or Stomach cancer.  ROS:   Please see the history of present illness.     EKGs/Labs/Other Studies Reviewed:    Recent Labs: 08/19/2021: B Natriuretic Peptide 53.4; Magnesium 1.9 08/20/2021: ALT 15; TSH 1.145 08/21/2021: BUN 19; Creatinine, Ser 1.43; Hemoglobin 11.3; Platelets 134; Potassium 4.3; Sodium 138   Recent Lipid Panel Lab Results  Component Value Date/Time   CHOL 147 06/16/2021 08:37 AM   TRIG 288 (H) 06/16/2021 08:37 AM   HDL 27 (L) 06/16/2021 08:37 AM    LDLCALC 73 06/16/2021 08:37 AM    Physical Exam:    VS:  BP 134/70   Pulse 61   Ht 5' 11"$  (1.803 m)   Wt 175 lb 6.4 oz (79.6 kg)   SpO2 96%   BMI 24.46 kg/m    No data found.       Wt Readings from Last 3 Encounters:  05/23/22 175 lb 6.4 oz (79.6 kg)  03/14/22 180 lb (81.6 kg)  01/04/22 183 lb 2 oz (83.1 kg)     GEN: Well nourished, well developed in no acute distress HEENT: Normal NECK: No JVD; No carotid bruits CARDIAC: RRR, no murmurs, rubs, gallops RESPIRATORY:  Clear to auscultation without rales, wheezing or rhonchi  ABDOMEN: Soft, non-tender, non-distended MUSCULOSKELETAL: No edema SKIN: Warm and dry NEUROLOGIC:  Alert and oriented PSYCHIATRIC:  Normal affect     ASSESSMENT AND PLAN   Chronic diastolic heart failure, euvolemic -Did not tolerate Farxiga 92m with lasix 815mwith AKI; b/l Cr 1.5-1.6 -Estimated dry weight 170 pounds.  He still has Lasix as needed. -With recent lightheaded episode, recommend rechecking a BMET.   CAD with no angina -CTA coronaries 04/2021: significant stenosis in the mid right coronary artery as well as widespread coronary atherosclerosis with multiple areas of mild-moderate disease  -With no angina and preserved EF, he is treated medically with a focus on risk factor modification. -Continue statin therapy.  Hypertension, recently labile -Increase Bystolic to 5 mg daily -take in the morning to help space out meds. -Continue lisinopril 40 mg daily and amlodipine 10 mg daily. -Bring blood pressure log to follow-up in 3 months.   Hyperlipidemia with goal LDL less than 70 -With LDL 73 in March 2023, pravastatin switched to Crestor.   -Check fasting lipids.   Disposition - Follow-up in 3 months - bring blood pressure log.   Medication Adjustments/Labs and Tests Ordered: Current medicines are reviewed  at length with the patient today.  Concerns regarding medicines are outlined above.  Orders Placed This Encounter  Procedures    Basic metabolic panel   Lipid panel   EKG 12-Lead   Meds ordered this encounter  Medications   nebivolol (BYSTOLIC) 5 MG tablet    Sig: Take 1 tablet (5 mg total) by mouth every morning.    Dispense:  90 tablet    Refill:  3    Patient Instructions  Medication Instructions:  Increase Bystolic from 2.5 mg to 5 mg ( Take 1 Tablet Daily In The Morning). *If you need a refill on your cardiac medications before your next appointment, please call your pharmacy*   Lab Work: BMET, Lipid Panel : To Be Done Friday 2/16. If you have labs (blood work) drawn today and your tests are completely normal, you will receive your results only by: [place] (if you have MyChart) OR A paper copy in the mail If you have any lab test that is abnormal or we need to change your treatment, we will call you to review the results.   Testing/Procedures: No Testing   Follow-Up: At [facility], you and your health needs are our priority.  As part of our continuing mission to provide you with exceptional heart care, we have created designated Provider Care Teams.  These Care Teams include your primary Cardiologist (physician) and Advanced Practice Providers (APPs -  Physician Assistants and Nurse Practitioners) who all work together to provide you with the care you need, when you need it.  We recommend signing up for the patient portal called "MyChart".  Sign up information is provided on this After Visit Summary.  MyChart is used to connect with patients for Virtual Visits (Telemedicine).  Patients are able to view lab/test results, encounter notes, upcoming appointments, etc.  Non-urgent messages can be sent to your provider as well.   To learn more about what you can do with MyChart, go to [email or web address].    Your next appointment:   3 month(s)  Provider:   [name], PA-C      Other Instructions Monitor Blood Pressure Daily. Bring Blood Pressure Log to Follow-up   Appointment.    Signed, Warren Lacy, PA-C  05/23/2022 1:09 PM    [facility]

## 2022-05-23 ENCOUNTER — Ambulatory Visit: Payer: 59 | Attending: Physician Assistant | Admitting: Physician Assistant

## 2022-05-23 VITALS — BP 134/70 | HR 61 | Ht 71.0 in | Wt 175.4 lb

## 2022-05-23 DIAGNOSIS — I2510 Atherosclerotic heart disease of native coronary artery without angina pectoris: Secondary | ICD-10-CM | POA: Diagnosis not present

## 2022-05-23 DIAGNOSIS — E785 Hyperlipidemia, unspecified: Secondary | ICD-10-CM | POA: Diagnosis not present

## 2022-05-23 DIAGNOSIS — I10 Essential (primary) hypertension: Secondary | ICD-10-CM

## 2022-05-23 DIAGNOSIS — I5032 Chronic diastolic (congestive) heart failure: Secondary | ICD-10-CM

## 2022-05-23 MED ORDER — NEBIVOLOL HCL 5 MG PO TABS: 5.0000 mg | ORAL_TABLET | Freq: Every morning | ORAL | 3 refills | Status: DC

## 2022-05-23 NOTE — Patient Instructions (Signed)
Medication Instructions:  Increase Bystolic from 2.5 mg to 5 mg ( Take 1 Tablet Daily In The Morning). *If you need a refill on your cardiac medications before your next appointment, please call your pharmacy*   Lab Work: BMET, Lipid Panel : To Be Done Friday 2/16. If you have labs (blood work) drawn today and your tests are completely normal, you will receive your results only by: [place] (if you have MyChart) OR A paper copy in the mail If you have any lab test that is abnormal or we need to change your treatment, we will call you to review the results.   Testing/Procedures: No Testing   Follow-Up: At [facility], you and your health needs are our priority.  As part of our continuing mission to provide you with exceptional heart care, we have created designated Provider Care Teams.  These Care Teams include your primary Cardiologist (physician) and Advanced Practice Providers (APPs -  Physician Assistants and Nurse Practitioners) who all work together to provide you with the care you need, when you need it.  We recommend signing up for the patient portal called "MyChart".  Sign up information is provided on this After Visit Summary.  MyChart is used to connect with patients for Virtual Visits (Telemedicine).  Patients are able to view lab/test results, encounter notes, upcoming appointments, etc.  Non-urgent messages can be sent to your provider as well.   To learn more about what you can do with MyChart, go to [email or web address].    Your next appointment:   3 month(s)  Provider:   [name], PA-C      Other Instructions Monitor Blood Pressure Daily. Bring Blood Pressure Log to Follow-up  Appointment.

## 2022-06-11 ENCOUNTER — Other Ambulatory Visit: Payer: Self-pay | Admitting: Gastroenterology

## 2022-06-13 ENCOUNTER — Other Ambulatory Visit (HOSPITAL_COMMUNITY): Payer: Self-pay | Admitting: Family Medicine

## 2022-06-13 DIAGNOSIS — I10 Essential (primary) hypertension: Secondary | ICD-10-CM

## 2022-06-15 ENCOUNTER — Ambulatory Visit (HOSPITAL_COMMUNITY)
Admission: RE | Admit: 2022-06-15 | Discharge: 2022-06-15 | Disposition: A | Discharge status: Home / Self Care | Payer: 59 | Source: Ambulatory Visit | Attending: Cardiovascular Disease | Admitting: Cardiovascular Disease

## 2022-06-15 DIAGNOSIS — I10 Essential (primary) hypertension: Secondary | ICD-10-CM | POA: Diagnosis present

## 2022-07-08 ENCOUNTER — Other Ambulatory Visit: Payer: Self-pay | Admitting: Physician Assistant

## 2022-08-01 ENCOUNTER — Other Ambulatory Visit: Payer: Self-pay | Admitting: Physician Assistant

## 2022-08-17 ENCOUNTER — Other Ambulatory Visit: Payer: Self-pay | Admitting: Gastroenterology

## 2022-08-28 NOTE — Progress Notes (Unsigned)
Cardiology Office Note:    Date:  08/29/2022   ID:  Jesse Lane, DOB 1955/09/03, MRN [id number]  PCP:  Jesse Davenport, MD [facility] Cardiologist: Jesse Fair, MD   Reason for visit: 66-month follow-up  History of Present Illness:    Jesse Lane is a 67 y.o. male with a hx of chronic diastolic heart failure, diabetes (A1C 6.1% April 2024), hypertension and chronic kidney disease.  He also has a history of gastric ulcers, GERD.  He reports issues with early morning nausea and poor food intake ever since treatment for metastatic melanoma.    With coronary calcification on CT scan, he was scheduled for coronary CTA.  CTA showed a significant stenosis in the mid right coronary artery as well as widespread coronary atherosclerosis with multiple areas of mild-moderate disease and a coronary calcium score that places him in the 82nd percentile.  With no angina and preserved EF, he is treated medically with a focus on risk factor modification.  Over the past year, we have adjusted his heart failure medications after his weight loss.  I last saw him in February 2024.  His endocrinologist had restarted Farxiga at 5 mg daily and stopped his Lasix.  His dry weight at that time was 170 pounds.  He had unchanged shortness of breath with moderate exertion.  His concern was elevated blood pressure up to systolics of 200s.  His Bystolic was increased to 5 mg daily.  He was continued on lisinopril 40 mg and amlodipine 10 mg daily.  He is here for blood pressure follow-up.  Patient feels well today.  He forgot to bring his blood pressure log today.  He states blood pressure often runs high in the mornings up to 204/90.  He typically checks his blood pressure in the morning.  He does think his blood pressure improved after starting taking Bystolic in the morning.  He states compliance with his medications.  Otherwise he is doing well from a heart failure standpoint.  Weight is stable 172 to  173 pounds.  His dyspnea with moderate exertion is stable.  He recently went to [facility] and was able to walk on the beach.  He denies chest pain, palpitations, lightheadedness, PND, orthopnea and lower extremity edema.  He had his labs checked through [facility] in April 2024 with creatinine 1.44 (see care everywhere).    Past Medical History:  Diagnosis Date   Arthritis    Diabetes mellitus type 2, uncontrolled    Diabetic neuropathy (HCC)    GERD (gastroesophageal reflux disease)    History of stomach ulcers    Hypertension    Melanoma (HCC)    mets to lymph node and intestines    Past Surgical History:  Procedure Laterality Date   ABCESS DRAINAGE     gluteal area   COLONOSCOPY     INGUINAL HERNIA REPAIR N/A 08/15/2015   Procedure: HERNIA REPAIR INGUINAL INCARCERATED;  Surgeon: Ovidio Kin, MD;  Location: [facility];  Service: General;  Laterality: N/A;  with MESH   pyloric stenosis     TONSILLECTOMY      Current Medications: Current Meds  Medication Sig   acetaminophen (TYLENOL) 500 MG tablet Take 1,000 mg by mouth every 6 (six) hours as needed for mild pain.   alendronate (FOSAMAX) 70 MG tablet Take 70 mg by mouth once a week. Wednesday's   amLODipine (NORVASC) 10 MG tablet Take 10 mg by mouth daily.   cyclobenzaprine (FLEXERIL) 10 MG tablet  Take 10 mg by mouth at bedtime as needed.   famotidine (PEPCID) 40 MG tablet Take 1 tablet by mouth twice daily   FARXIGA 5 MG TABS tablet Take 5 mg by mouth daily.   feeding supplement (ENSURE ENLIVE / ENSURE PLUS) LIQD Take 237 mLs by mouth 2 (two) times daily between meals.   furosemide (LASIX) 80 MG tablet Take 80 mg by mouth daily.   insulin degludec (TRESIBA FLEXTOUCH) 100 UNIT/ML FlexTouch Pen Inject 25 Units into the skin daily before breakfast.   insulin lispro (HUMALOG) 100 UNIT/ML KwikPen Inject 7-9 Units into the skin 3 (three) times daily. Per sliding scale   Insulin Pen Needle (BD PEN NEEDLE MICRO U/F) 32G X 6 MM MISC 1  each by Other route See admin instructions. Use one pen needle to inject insulin 4 times daily. **must have appointment for additional refills.**   Insulin Pen Needle (BD PEN NEEDLE MICRO U/F) 32G X 6 MM MISC USE 1 PEN NEEDLE SIX TIMES DAILY (Patient taking differently: 1 each by Other route See admin instructions. USE 1 PEN NEEDLE SIX TIMES DAILY)   lisinopril (ZESTRIL) 40 MG tablet Take 40 mg by mouth daily.   loperamide (IMODIUM) 2 MG capsule Take 1 capsule by mouth once daily   MELATONIN PO Take 30 mg by mouth at bedtime.   montelukast (SINGULAIR) 10 MG tablet Take 10 mg by mouth at bedtime.   Multiple Vitamin (MULTIVITAMIN WITH MINERALS) TABS tablet Take 1 tablet by mouth daily.   nebivolol (BYSTOLIC) 10 MG tablet Take 1 tablet (10 mg total) by mouth daily.   ondansetron (ZOFRAN) 4 MG tablet TAKE 1 TABLET BY MOUTH EVERY 4 TO 6 HOURS AS NEEDED FOR NAUSEA   pantoprazole (PROTONIX) 40 MG tablet Take 1 tablet by mouth twice daily   potassium chloride SA (KLOR-CON M) 20 MEQ tablet 20 mEq daily   prazosin (MINIPRESS) 1 MG capsule Take 1 mg by mouth at bedtime.   pregabalin (LYRICA) 100 MG capsule Take 200-300 mg by mouth See admin instructions. Take 2 capsule (200 mg) by mouth every morning and take three capsules (300 mg) at bedtime as needed for nerve pain   sertraline (ZOLOFT) 50 MG tablet Take 50 mg by mouth daily.   traZODone (DESYREL) 100 MG tablet Take 100 mg by mouth at bedtime.   [DISCONTINUED] nebivolol (BYSTOLIC) 5 MG tablet Take 1 tablet (5 mg total) by mouth every morning.     Allergies:   Contrast media [iodinated contrast media]   Social History   Socioeconomic History   Marital status: Significant Other    Spouse name: Jesse Lane- S/O   Number of children: 1   Years of education: Not on file   Highest education level: Some college, no degree  Occupational History   Occupation: retired  Tobacco Use   Smoking status: Former    Packs/day: 1.00    Years: 8.00     Additional pack years: 0.00    Total pack years: 8.00    Types: Cigarettes    Quit date: 1998    Years since quitting: 26.4   Smokeless tobacco: Never  Vaping Use   Vaping Use: Never used  Substance and Sexual Activity   Alcohol use: Yes    Alcohol/week: 4.0 standard drinks of alcohol    Types: 4 Glasses of wine per week    Comment: occasionally   Drug use: Not Currently    Frequency: 7.0 times per week    Types: Marijuana  Comment: daily   Sexual activity: Not on file  Other Topics Concern   Not on file  Social History Narrative   Not on file   Social Determinants of Health   Financial Resource Strain: High Risk (05/04/2021)   Overall Financial Resource Strain (CARDIA)    Difficulty of Paying Living Expenses: Hard  Food Insecurity: No Food Insecurity (05/04/2021)   Hunger Vital Sign    Worried About Running Out of Food in the Last Year: Never true    Ran Out of Food in the Last Year: Never true  Transportation Needs: No Transportation Needs (05/04/2021)   PRAPARE - Administrator, Civil Service (Medical): No    Lack of Transportation (Non-Medical): No  Physical Activity: Not on file  Stress: Not on file  Social Connections: Not on file     Family History: The patient's family history includes Bone cancer in his mother; Breast cancer in his mother; Diabetes in his paternal aunt; Melanoma in his mother; Pancreatic cancer in his maternal grandfather; Prostate cancer in his father. There is no history of Colon cancer, Colon polyps, Esophageal cancer, or Stomach cancer.  ROS:   Please see the history of present illness.     EKGs/Labs/Other Studies Reviewed:    Recent Labs: No results found for requested labs within last 365 days.   Recent Lipid Panel Lab Results  Component Value Date/Time   CHOL 147 06/16/2021 08:37 AM   TRIG 288 (H) 06/16/2021 08:37 AM   HDL 27 (L) 06/16/2021 08:37 AM   LDLCALC 73 06/16/2021 08:37 AM    Physical Exam:    VS:  BP  (!) 150/82   Pulse (!) 56   Ht 5\' 11"  (1.803 m)   Wt 176 lb 3.2 oz (79.9 kg)   SpO2 99%   BMI 24.57 kg/m    No data found.   Wt Readings from Last 3 Encounters:  08/29/22 176 lb 3.2 oz (79.9 kg)  05/23/22 175 lb 6.4 oz (79.6 kg)  03/14/22 180 lb (81.6 kg)     GEN:  Well nourished, well developed in no acute distress HEENT: Normal NECK: No JVD; No carotid bruits CARDIAC: RRR, no murmurs, rubs, gallops RESPIRATORY:  Clear to auscultation without rales, wheezing or rhonchi  ABDOMEN: Soft, non-tender, non-distended MUSCULOSKELETAL: No edema SKIN: Warm and dry NEUROLOGIC:  Alert and oriented PSYCHIATRIC:  Normal affect    ASSESSMENT AND PLAN   Chronic diastolic heart failure, euvolemic -Currently taking Farxiga 5 mg daily.  He has Lasix 80 mg as needed for fluid retention -but has not needed to use recently.  His creatinine is stable at 1.44 in April. -His dry weight is 172 to 173 pounds.   CAD with no angina -CTA coronaries 04/2021: significant stenosis in the mid right coronary artery as well as widespread coronary atherosclerosis with multiple areas of mild-moderate disease  -Treated medically -Continue statin therapy.   Hypertension, BP elevated. -Increase Bystolic to 10 mg daily. -Continue lisinopril 40 mg daily and amlodipine 10 mg daily. -I am hesitant to change him to [facility] or add chlorthalidone giving his history of labile kidney function.  Option to add hydralazine if remains uncontrolled. -Will refer to hypertension clinic.  Please bring blood pressure log to appointment and check blood pressure at different times of the day.  Bring home blood pressure cuff to appointment.   Hyperlipidemia with goal LDL less than 70 -With LDL 73 in March 2023, pravastatin switched to Crestor.   -Check fasting  lipids.   Disposition - Follow-up in 6 months with Dr. Royann Shivers.   Medication Adjustments/Labs and Tests Ordered: Current medicines are reviewed at length with the  patient today.  Concerns regarding medicines are outlined above.  Orders Placed This Encounter  Procedures   Lipid panel   AMB REFERRAL TO ADVANCED HTN CLINIC   Meds ordered this encounter  Medications   nebivolol (BYSTOLIC) 10 MG tablet    Sig: Take 1 tablet (10 mg total) by mouth daily.    Dispense:  90 tablet    Refill:  3    Patient Instructions  Medication Instructions:  Increase Bystolic from 5 mg to 10 mg ( Take 1 Tablet Daily). *If you need a refill on your cardiac medications before your next appointment, please call your pharmacy*   Lab Work: Lipid Panel If you have labs (blood work) drawn today and your tests are completely normal, you will receive your results only by: MyChart Message (if you have MyChart) OR A paper copy in the mail If you have any lab test that is abnormal or we need to change your treatment, we will call you to review the results.   Testing/Procedures: No Testing   Follow-Up: At [facility], you and your health needs are our priority.  As part of our continuing mission to provide you with exceptional heart care, we have created designated Provider Care Teams.  These Care Teams include your primary Cardiologist (physician) and Advanced Practice Providers (APPs -  Physician Assistants and Nurse Practitioners) who all work together to provide you with the care you need, when you need it.  We recommend signing up for the patient portal called "MyChart".  Sign up information is provided on this After Visit Summary.  MyChart is used to connect with patients for Virtual Visits (Telemedicine).  Patients are able to view lab/test results, encounter notes, upcoming appointments, etc.  Non-urgent messages can be sent to your provider as well.   To learn more about what you can do with MyChart, go to [email or web address].    Your next appointment:   6 month(s)  Provider:   Thurmon Fair, MD    Other Instructions Bring Blood Pressure Log  and Blood Pressure Cuff to Follow-up Visit.    Signed, Cannon Kettle, PA-C  08/29/2022 10:00 AM    [facility]

## 2022-08-29 ENCOUNTER — Other Ambulatory Visit: Payer: Self-pay | Admitting: Physician Assistant

## 2022-08-29 ENCOUNTER — Encounter: Payer: Self-pay | Admitting: Physician Assistant

## 2022-08-29 ENCOUNTER — Ambulatory Visit: Payer: 59 | Attending: Physician Assistant | Admitting: Physician Assistant

## 2022-08-29 VITALS — BP 150/82 | HR 56 | Ht 71.0 in | Wt 176.2 lb

## 2022-08-29 DIAGNOSIS — I2510 Atherosclerotic heart disease of native coronary artery without angina pectoris: Secondary | ICD-10-CM | POA: Diagnosis not present

## 2022-08-29 DIAGNOSIS — E785 Hyperlipidemia, unspecified: Secondary | ICD-10-CM

## 2022-08-29 DIAGNOSIS — I5032 Chronic diastolic (congestive) heart failure: Secondary | ICD-10-CM | POA: Diagnosis not present

## 2022-08-29 DIAGNOSIS — I10 Essential (primary) hypertension: Secondary | ICD-10-CM

## 2022-08-29 MED ORDER — NEBIVOLOL HCL 10 MG PO TABS: 10.0000 mg | ORAL_TABLET | Freq: Every day | ORAL | 3 refills | Status: DC

## 2022-08-29 NOTE — Patient Instructions (Signed)
Medication Instructions:  Increase Bystolic from 5 mg to 10 mg ( Take 1 Tablet Daily). *If you need a refill on your cardiac medications before your next appointment, please call your pharmacy*   Lab Work: Lipid Panel If you have labs (blood work) drawn today and your tests are completely normal, you will receive your results only by: MyChart Message (if you have MyChart) OR A paper copy in the mail If you have any lab test that is abnormal or we need to change your treatment, we will call you to review the results.   Testing/Procedures: No Testing   Follow-Up: At [facility], you and your health needs are our priority.  As part of our continuing mission to provide you with exceptional heart care, we have created designated Provider Care Teams.  These Care Teams include your primary Cardiologist (physician) and Advanced Practice Providers (APPs -  Physician Assistants and Nurse Practitioners) who all work together to provide you with the care you need, when you need it.  We recommend signing up for the patient portal called "MyChart".  Sign up information is provided on this After Visit Summary.  MyChart is used to connect with patients for Virtual Visits (Telemedicine).  Patients are able to view lab/test results, encounter notes, upcoming appointments, etc.  Non-urgent messages can be sent to your provider as well.   To learn more about what you can do with MyChart, go to [email or web address].    Your next appointment:   6 month(s)  Provider:   Thurmon Fair, MD    Other Instructions Bring Blood Pressure Log and Blood Pressure Cuff to Follow-up Visit.

## 2022-08-30 ENCOUNTER — Telehealth: Payer: Self-pay

## 2022-08-30 LAB — LIPID PANEL
Chol/HDL Ratio: 2.6 ratio (ref 0.0–5.0)
Cholesterol, Total: 111 mg/dL (ref 100–199)
HDL: 43 mg/dL (ref >39)
LDL Chol Calc (NIH): 42 mg/dL (ref 0–99)
Triglycerides: 156 mg/dL — ABNORMAL HIGH (ref 0–149)
VLDL Cholesterol Cal: 26 mg/dL (ref 5–40)

## 2022-08-30 NOTE — Telephone Encounter (Addendum)
Called patient regarding results. Patient had understanding of results.----- Message from Cannon Kettle, PA-C sent at 08/30/2022  1:13 PM EDT ----- Ordered for Hyperlipidemia with goal LDL less than 70.  With LDL 73 in March 2023, pravastatin switched to Crestor.    Results: LDL has improved to goal.  Triglycerides have also improved.  Continue current management.

## 2022-09-02 ENCOUNTER — Other Ambulatory Visit: Payer: Self-pay | Admitting: Physician Assistant

## 2022-09-07 ENCOUNTER — Encounter: Payer: Self-pay | Admitting: Pharmacist

## 2022-09-07 ENCOUNTER — Ambulatory Visit: Payer: 59 | Attending: Cardiology | Admitting: Pharmacist

## 2022-09-07 VITALS — BP 172/85 | HR 54

## 2022-09-07 DIAGNOSIS — I10 Essential (primary) hypertension: Secondary | ICD-10-CM | POA: Diagnosis not present

## 2022-09-07 MED ORDER — VALSARTAN 160 MG PO TABS
ORAL_TABLET | ORAL | 2 refills | Status: DC
Start: 2022-09-07 — End: 2022-10-05

## 2022-09-07 NOTE — Patient Instructions (Addendum)
It was nice meeting you today  We would like your blood pressure to be less than 140/80  We will discontinue your lisinopril at this time and start a new medication called valsartan 160mg  once a day in the morning  Continue your: Amlodipine 10mg  daily Nebivolol 10mg  daily Prazosin 1mg  in the evning  Try to watch how much salt you are eating  Please update your lab work in about two weeks  Laural Golden, PharmD, BCACP, CDCES, CPP [address], [address] [place], [place], [address] Phone: [phone], Fax: [phone]

## 2022-09-07 NOTE — Progress Notes (Signed)
Patient ID: [name]                 DOB: Mar 16, 1956                      MRN: [id number]     HPI: Jesse Lane is a 67 y.o. male referred by Juanda Crumble PA-C to HTN clinic. PMH is significant for controlled DM (A1c 6.1), CHF, hypertriglyceridemia, hypertension, CKD, coronary calcium, and malignant melanoma.  Patient presents today in good spirits. Reports BP was controlled until previous 3 months when has increased. Has been checking at home but not writing down results. Says average is ~185/90.  Currently on amlodipine 10mg , lisinopril 40mg , nebivolol 10mg , and prazosin 1mg  at bedtime. Also takes [place] 5mg . Previously on 10mg  but reduced dose due to dehydration. Has furosemide 80mg  at home as needed.  Does not add salt to foods but uses salt free Cajun seasoning on most of his foods. Uses marijuana  but has quit smoking cigarettes many years ago. Previously drank alcohol heavily but now only a glass of wine occasionally.  GDMT has been difficult to achieve due to renal function and dehydration.   Current HTN meds:  Amlodipine 10mg  Lisinopril 40mg  daily Nebivolol 10mg   Prazosin 1mg  at bedtime  BP goal: <140/80   Wt Readings from Last 3 Encounters:  08/29/22 176 lb 3.2 oz (79.9 kg)  05/23/22 175 lb 6.4 oz (79.6 kg)  03/14/22 180 lb (81.6 kg)   BP Readings from Last 3 Encounters:  09/07/22 (!) 172/85  08/29/22 (!) 150/82  05/23/22 134/70   Pulse Readings from Last 3 Encounters:  09/07/22 (!) 54  08/29/22 (!) 56  05/23/22 61    Renal function: CrCl cannot be calculated (Patient's most recent lab result is older than the maximum 21 days allowed.).  Past Medical History:  Diagnosis Date   Arthritis    Diabetes mellitus type 2, uncontrolled    Diabetic neuropathy (HCC)    GERD (gastroesophageal reflux disease)    History of stomach ulcers    Hypertension    Melanoma (HCC)    mets to lymph node and intestines    Current Outpatient Medications on  File Prior to Visit  Medication Sig Dispense Refill   acetaminophen (TYLENOL) 500 MG tablet Take 1,000 mg by mouth every 6 (six) hours as needed for mild pain.     alendronate (FOSAMAX) 70 MG tablet Take 70 mg by mouth once a week. Wednesday's     amLODipine (NORVASC) 10 MG tablet Take 10 mg by mouth daily.     cyclobenzaprine (FLEXERIL) 10 MG tablet Take 10 mg by mouth at bedtime as needed.     famotidine (PEPCID) 40 MG tablet Take 1 tablet by mouth twice daily 60 tablet 6   FARXIGA 5 MG TABS tablet Take 5 mg by mouth daily.     feeding supplement (ENSURE ENLIVE / ENSURE PLUS) LIQD Take 237 mLs by mouth 2 (two) times daily between meals. 237 mL 12   furosemide (LASIX) 80 MG tablet Take 80 mg by mouth daily.     insulin degludec (TRESIBA FLEXTOUCH) 100 UNIT/ML FlexTouch Pen Inject 25 Units into the skin daily before breakfast.     insulin lispro (HUMALOG) 100 UNIT/ML KwikPen Inject 7-9 Units into the skin 3 (three) times daily. Per sliding scale     Insulin Pen Needle (BD PEN NEEDLE MICRO U/F) 32G X 6 MM MISC 1 each by Other route See admin  instructions. Use one pen needle to inject insulin 4 times daily. **must have appointment for additional refills.** 150 each 0   Insulin Pen Needle (BD PEN NEEDLE MICRO U/F) 32G X 6 MM MISC USE 1 PEN NEEDLE SIX TIMES DAILY (Patient taking differently: 1 each by Other route See admin instructions. USE 1 PEN NEEDLE SIX TIMES DAILY) 200 each 2   loperamide (IMODIUM) 2 MG capsule Take 1 capsule by mouth once daily 30 capsule 3   MELATONIN PO Take 30 mg by mouth at bedtime.     montelukast (SINGULAIR) 10 MG tablet Take 10 mg by mouth at bedtime.     Multiple Vitamin (MULTIVITAMIN WITH MINERALS) TABS tablet Take 1 tablet by mouth daily.     nebivolol (BYSTOLIC) 10 MG tablet Take 1 tablet (10 mg total) by mouth daily. 90 tablet 3   ondansetron (ZOFRAN) 4 MG tablet TAKE 1 TABLET BY MOUTH EVERY 4 TO 6 HOURS AS NEEDED FOR NAUSEA 90 tablet 0   pantoprazole (PROTONIX) 40  MG tablet Take 1 tablet by mouth twice daily 60 tablet 0   potassium chloride SA (KLOR-CON M) 20 MEQ tablet 20 mEq daily 36 tablet 0   prazosin (MINIPRESS) 1 MG capsule Take 1 mg by mouth at bedtime.     pregabalin (LYRICA) 100 MG capsule Take 200-300 mg by mouth See admin instructions. Take 2 capsule (200 mg) by mouth every morning and take three capsules (300 mg) at bedtime as needed for nerve pain     rosuvastatin (CRESTOR) 20 MG tablet Take 1 tablet by mouth once daily 90 tablet 0   sertraline (ZOLOFT) 50 MG tablet Take 50 mg by mouth daily.     traZODone (DESYREL) 100 MG tablet Take 100 mg by mouth at bedtime.  0   No current facility-administered medications on file prior to visit.    Allergies  Allergen Reactions   Contrast Media [Iodinated Contrast Media] Itching     Assessment/Plan:  1. Hypertension -  HYPERTENSION CONTROL Vitals:   09/07/22 0948 09/07/22 0949  BP: (!) 180/86 (!) 172/85    The patient's blood pressure is elevated above target today.  In order to address the patient's elevated BP: A current anti-hypertensive medication was adjusted today.; A new medication was prescribed today.   Patient BP elevated today at 180/86 which is similar to his self reported home readings. Discussed options with patient such as increasing nebivolol (limited by pulse rate), adding thiazide diuretic (limited by renal function and dehydration), adding hydralazine, or switching lisinopril to stronger agent. Patient agreeable to switching lisinopril to valsartan. Will also be beneficial if he is able to switch to [facility] at some point. Check BMP in 1-2 weeks. Recommended continuing to restrict sodium intake. Follow up in clinic in 4 weeks.  Continue:  Amlodipine 10mg  Nebivolol 10mg   Prazosin 1mg  at bedtime D/C lisinopril Start valsartan 160mg  once daily Check BMP in 1-2 weeks Follow up in 4 weeks  Laural Golden, PharmD, BCACP, CDCES, CPP [address], [address] [place], [place], [address] Phone: [phone], Fax: [phone]

## 2022-09-13 ENCOUNTER — Other Ambulatory Visit: Payer: Self-pay | Admitting: Gastroenterology

## 2022-10-01 ENCOUNTER — Telehealth: Payer: Self-pay | Admitting: Cardiovascular Disease

## 2022-10-01 DIAGNOSIS — I10 Essential (primary) hypertension: Secondary | ICD-10-CM

## 2022-10-01 MED ORDER — HYDRALAZINE HCL 25 MG PO TABS
25.0000 mg | ORAL_TABLET | Freq: Three times a day (TID) | ORAL | 3 refills | Status: DC
Start: 2022-10-01 — End: 2023-02-04

## 2022-10-01 NOTE — Telephone Encounter (Signed)
Pt c/o BP issue: STAT if pt c/o blurred vision, one-sided weakness or slurred speech  1. What are your last 5 BP readings? 220/112, 208/115  2. Are you having any other symptoms (ex. Dizziness, headache, blurred vision, passed out)? Dizziness when walking.   3. What is your BP issue? Pt states that he has been experiencing High BP for about two weeks and thinks it could be due to a medication change at last visit on 5/31. Please advise.

## 2022-10-01 NOTE — Telephone Encounter (Addendum)
Agree with ED given current BP readings and with prior history of stroke. If he does not want to go to the ED, would increase his valsartan from 160mg  daily to 320mg  daily, add hydralazine 25mg  TID, limit salt/caffeine in his diet, and move up PharmD f/u appt to sooner availability in the next 2 weeks if possible.

## 2022-10-01 NOTE — Telephone Encounter (Signed)
Spoke to the pt, for the past two weeks he has experienced hypertension. Pt stated he has gastroparesis, he hasn't eaten much in the past couple of days. Pt stated today  he took his AM medication around midnight, checked his BP hours later 220/112, rechecked bp while on the phone 227/136, pt is currently asymptomatic and does not monitor bp at home. Explained ED precautions, pt voiced understanding. Will forward to Pharm D for advise.

## 2022-10-01 NOTE — Telephone Encounter (Signed)
Spoke with the pt, explained [facility] recommendations:     Agree with ED given current BP readings and with prior history of stroke. If he does not want to go to the ED, would increase his valsartan from 160mg  daily to 320mg  daily, add hydralazine 25mg  TID, limit salt/caffeine in his diet, and move up PharmD f/u appt to sooner availability in the next 2 weeks if possible.     Patient stated he does have scheduled appointment with with Pharm D on 10/05/22 @0930 . Verified and sent Hydralazine 25 mg to the pharmacy on file. Advised pt to contiune monitor bp, explained ED precautions, and pt voiced understanding.

## 2022-10-04 NOTE — Progress Notes (Signed)
Patient ID: Jesse Lane                 DOB: [birth year]                      MRN: [id number]      HPI: Jesse Lane is a 67 y.o. male referred by Dr. Royann Shivers to HTN clinic. PMH is significant for controlled DM (A1c 6.1), CHF, hypertriglyceridemia, hypertension, CKD, coronary calcium, and malignant melanoma.   Pt saw Juanda Crumble, PA-C on 03/23/2023 he was concern about his elevated BP so nebivolol  5 mg was moved from evening to morning and patient was told to continue lisinopril 40 mg daily and amlodipine 10 mg daily  and on 05/22 nebivolol dose was increased to 10 mg daily. On 05/31 patient saw PharmD  his BP was still above goal so lisinopril was switched to valsartan 160 mg daily. On 06/24 patient call clinic reporting his BP of 220/112, 208/115. valsartan dose was from 160mg  daily to 320mg  daily,  hydralazine 25 mg three times daily was added patient was advised to limit salt/caffeine in his diet.  Patient presented today reports his home BP still stays in 220/110 range. He hs not started taking valsartan increased dose and he takes hydralazine 50 mg in the morning only instead of 25 mg three times daily. Discussed the significance of meds adherence and provided pill box to track adherence. He does not use salt and due to leg pain ( diabetic neuropathy)unable to exercise. He smokes marijuana every day sometime multiple times per day. Discussed with patient that People who smoke marijuana may face a higher risk of dying of complications of high blood pressure than people who never use the drug. He is in agreement to quit or at least cut down on smoking at first with aim to quit.  Current HTN meds: valsartan 160 mg daily (am), hydralazine 25 mg three times daily, amlodipine 10 mg daily(AM), nebivolol 10 mg daily (pm) Previously tried: lisinopril  BP goal: <130/80   Social History:  Alcohol: 7 drinks per week  Tobacco Smoking: none Marijuana smoking: everyday   Diet: salt intake   Cook at home - does not add salt while cooking and does not add salt on his cooked food  Snacks- Fruits  Drink- Gatorade- sugar free mountain due - every morning and 2 -12 oz water bottles   Exercise: due to diabetic neuropathy - unable to exercise    Home BP readings: 220/110 mmHg   Wt Readings from Last 3 Encounters:  08/29/22 176 lb 3.2 oz (79.9 kg)  05/23/22 175 lb 6.4 oz (79.6 kg)  03/14/22 180 lb (81.6 kg)   BP Readings from Last 3 Encounters:  10/05/22 (!) 198/94  09/07/22 (!) 172/85  08/29/22 (!) 150/82   Pulse Readings from Last 3 Encounters:  10/05/22 60  09/07/22 (!) 54  08/29/22 (!) 56    Renal function: CrCl cannot be calculated (Patient's most recent lab result is older than the maximum 21 days allowed.).  Past Medical History:  Diagnosis Date   Arthritis    Diabetes mellitus type 2, uncontrolled    Diabetic neuropathy (HCC)    GERD (gastroesophageal reflux disease)    History of stomach ulcers    Hypertension    Melanoma (HCC)    mets to lymph node and intestines    Current Outpatient Medications on File Prior to Visit  Medication Sig Dispense Refill   acetaminophen (TYLENOL) 500  MG tablet Take 1,000 mg by mouth every 6 (six) hours as needed for mild pain.     alendronate (FOSAMAX) 70 MG tablet Take 70 mg by mouth once a week. Wednesday's     amLODipine (NORVASC) 10 MG tablet Take 10 mg by mouth daily.     cyclobenzaprine (FLEXERIL) 10 MG tablet Take 10 mg by mouth at bedtime as needed.     famotidine (PEPCID) 40 MG tablet Take 1 tablet by mouth twice daily 60 tablet 6   FARXIGA 5 MG TABS tablet Take 5 mg by mouth daily.     feeding supplement (ENSURE ENLIVE / ENSURE PLUS) LIQD Take 237 mLs by mouth 2 (two) times daily between meals. 237 mL 12   furosemide (LASIX) 80 MG tablet Take 80 mg by mouth daily.     hydrALAZINE (APRESOLINE) 25 MG tablet Take 1 tablet (25 mg total) by mouth 3 (three) times daily. 270 tablet 3   insulin degludec (TRESIBA  FLEXTOUCH) 100 UNIT/ML FlexTouch Pen Inject 25 Units into the skin daily before breakfast.     insulin lispro (HUMALOG) 100 UNIT/ML KwikPen Inject 7-9 Units into the skin 3 (three) times daily. Per sliding scale     Insulin Pen Needle (BD PEN NEEDLE MICRO U/F) 32G X 6 MM MISC 1 each by Other route See admin instructions. Use one pen needle to inject insulin 4 times daily. **must have appointment for additional refills.** 150 each 0   Insulin Pen Needle (BD PEN NEEDLE MICRO U/F) 32G X 6 MM MISC USE 1 PEN NEEDLE SIX TIMES DAILY (Patient taking differently: 1 each by Other route See admin instructions. USE 1 PEN NEEDLE SIX TIMES DAILY) 200 each 2   loperamide (IMODIUM) 2 MG capsule Take 1 capsule by mouth once daily 30 capsule 3   MELATONIN PO Take 30 mg by mouth at bedtime.     montelukast (SINGULAIR) 10 MG tablet Take 10 mg by mouth at bedtime.     Multiple Vitamin (MULTIVITAMIN WITH MINERALS) TABS tablet Take 1 tablet by mouth daily.     nebivolol (BYSTOLIC) 10 MG tablet Take 1 tablet (10 mg total) by mouth daily. 90 tablet 3   ondansetron (ZOFRAN) 4 MG tablet TAKE 1 TABLET BY MOUTH EVERY 4 TO 6 HOURS AS NEEDED FOR NAUSEA 90 tablet 0   pantoprazole (PROTONIX) 40 MG tablet Take 1 tablet by mouth twice daily 60 tablet 0   potassium chloride SA (KLOR-CON M) 20 MEQ tablet 20 mEq daily 36 tablet 0   prazosin (MINIPRESS) 1 MG capsule Take 1 mg by mouth at bedtime.     pregabalin (LYRICA) 100 MG capsule Take 200-300 mg by mouth See admin instructions. Take 2 capsule (200 mg) by mouth every morning and take three capsules (300 mg) at bedtime as needed for nerve pain     rosuvastatin (CRESTOR) 20 MG tablet Take 1 tablet by mouth once daily 90 tablet 0   sertraline (ZOLOFT) 50 MG tablet Take 50 mg by mouth daily.     traZODone (DESYREL) 100 MG tablet Take 100 mg by mouth at bedtime.  0   No current facility-administered medications on file prior to visit.    Allergies  Allergen Reactions   Contrast  Media [Iodinated Contrast Media] Itching  HYPERTENSION CONTROL Vitals:   10/05/22 0943 10/05/22 0945  BP: (!) 190/93 (!) 198/94    The patient's blood pressure is elevated above target today.  In order to address the patient's elevated BP: A current  anti-hypertensive medication was adjusted today.      Blood pressure (!) 198/94, pulse 60, SpO2 98 %.   Assessment/Plan:  1. Hypertension -  Essential hypertension Assessment: BP is uncontrolled in office BP 190/93 2nd measurement 198/94 heart rate 60  Non adherence to the medication suspected  Have not increased valsartan dose from 160 mg to 320 mg and takes hydralazine 50 mg only in the morning instead of 25 mg three times daily ( prescribed dose) Discussed importance of taking medications as prescribed direction and provided pill box to track adherence  Smokes marijuana everyday, due to diabetic neuropathy unable to exercise  Denies SOB, palpitation, chest pain, headaches,or swelling Other BP medications amlodipine 10 mg daily(AM), nebivolol 10 mg daily (pm)   Plan:  Start taking valsartan 320 mg daily and follow up BMP due on July 8  Start taking hydralazine 25 mg three times daily  Continue taking amlodipine 10 mg daily(AM), nebivolol 10 mg daily (pm) Patient to keep record of BP readings with heart rate and report to [place] at the next visit Bring home BP monitor for validation at the next OV Patient to see PharmD in 4 weeks for follow up  Follow up lab(s) BMP in 1-2 weeks    Thank you  Carmela Hurt, Pharm.D [facility] [address] [place], [place] [address]  Phone: [phone]; Fax: [phone]

## 2022-10-05 ENCOUNTER — Ambulatory Visit: Payer: 59 | Attending: Cardiovascular Disease | Admitting: Student

## 2022-10-05 ENCOUNTER — Encounter: Payer: Self-pay | Admitting: Student

## 2022-10-05 VITALS — BP 198/94 | HR 60

## 2022-10-05 DIAGNOSIS — I10 Essential (primary) hypertension: Secondary | ICD-10-CM | POA: Diagnosis not present

## 2022-10-05 MED ORDER — VALSARTAN 320 MG PO TABS: 320.0000 mg | ORAL_TABLET | Freq: Every day | ORAL | 3 refills | Status: DC

## 2022-10-05 NOTE — Assessment & Plan Note (Signed)
Assessment: BP is uncontrolled in office BP 190/93 2nd measurement 198/94 heart rate 60  Non adherence to the medication suspected  Have not increased valsartan dose from 160 mg to 320 mg and takes hydralazine 50 mg only in the morning instead of 25 mg three times daily ( prescribed dose) Discussed importance of taking medications as prescribed direction and provided pill box to track adherence  Smokes marijuana everyday, due to diabetic neuropathy unable to exercise  Denies SOB, palpitation, chest pain, headaches,or swelling Other BP medications amlodipine 10 mg daily(AM), nebivolol 10 mg daily (pm)   Plan:  Start taking valsartan 320 mg daily and follow up BMP due on July 8  Start taking hydralazine 25 mg three times daily  Continue taking amlodipine 10 mg daily(AM), nebivolol 10 mg daily (pm) Patient to keep record of BP readings with heart rate and report to [place] at the next visit Bring home BP monitor for validation at the next OV Patient to see PharmD in 4 weeks for follow up  Follow up lab(s) BMP in 1-2 weeks

## 2022-10-05 NOTE — Patient Instructions (Addendum)
Changes made by your pharmacist Carmela Hurt, PharmD at today's visit:    Instructions/Changes  (what do you need to do) Your Notes  (what you did and when you did it)  Increase valsartan does from 160 mg to 320 mg daily    Start taking hydralazine 25 mg three times daily instead of 50 mg in the morning    Continue taking other BP medications regularly    Cut down on marijuana smoking     Bring all of your meds, your BP cuff and your record of home blood pressures to your next appointment.    HOW TO TAKE YOUR BLOOD PRESSURE AT HOME  Rest 5 minutes before taking your blood pressure.  Don't smoke or drink caffeinated beverages for at least 30 minutes before. Take your blood pressure before (not after) you eat. Sit comfortably with your back supported and both feet on the floor (don't cross your legs). Elevate your arm to heart level on a table or a desk. Use the proper sized cuff. It should fit smoothly and snugly around your bare upper arm. There should be enough room to slip a fingertip under the cuff. The bottom edge of the cuff should be 1 inch above the crease of the elbow. Ideally, take 3 measurements at one sitting and record the average.  Important lifestyle changes to control high blood pressure  Intervention  Effect on the BP  Lose extra pounds and watch your waistline Weight loss is one of the most effective lifestyle changes for controlling blood pressure. If you're overweight or obese, losing even a small amount of weight can help reduce blood pressure. Blood pressure might go down by about 1 millimeter of mercury (mm Hg) with each kilogram (about 2.2 pounds) of weight lost.  Exercise regularly As a general goal, aim for at least 30 minutes of moderate physical activity every day. Regular physical activity can lower high blood pressure by about 5 to 8 mm Hg.  Eat a healthy diet Eating a diet rich in whole grains, fruits, vegetables, and low-fat dairy products and low in  saturated fat and cholesterol. A healthy diet can lower high blood pressure by up to 11 mm Hg.  Reduce salt (sodium) in your diet Even a small reduction of sodium in the diet can improve heart health and reduce high blood pressure by about 5 to 6 mm Hg.  Limit alcohol One drink equals 12 ounces of beer, 5 ounces of wine, or 1.5 ounces of 80-proof liquor.  Limiting alcohol to less than one drink a day for women or two drinks a day for men can help lower blood pressure by about 4 mm Hg.   If you have any questions or concerns please use My Chart to send questions or call the office at [phone]

## 2022-10-17 ENCOUNTER — Telehealth: Payer: Self-pay | Admitting: Pharmacist

## 2022-10-17 DIAGNOSIS — I10 Essential (primary) hypertension: Secondary | ICD-10-CM

## 2022-10-17 NOTE — Telephone Encounter (Signed)
Called patient for lab reminder, BMP post Valsartan dose titration  Patient reports his BP has improved a lot now his lowest SBP ~135 and highest ~160 DBP ~80-85.  States he can not go for lab today but he will go for lab tomorrow.

## 2022-10-18 ENCOUNTER — Other Ambulatory Visit: Payer: Self-pay

## 2022-10-18 DIAGNOSIS — I10 Essential (primary) hypertension: Secondary | ICD-10-CM

## 2022-10-18 LAB — BASIC METABOLIC PANEL
BUN/Creatinine Ratio: 14 (ref 10–24)
BUN: 33 mg/dL — ABNORMAL HIGH (ref 8–27)
CO2: 22 mmol/L (ref 20–29)
Calcium: 9.2 mg/dL (ref 8.6–10.2)
Chloride: 100 mmol/L (ref 96–106)
Creatinine, Ser: 2.3 mg/dL — ABNORMAL HIGH (ref 0.76–1.27)
Glucose: 109 mg/dL — ABNORMAL HIGH (ref 70–99)
Potassium: 3.2 mmol/L — ABNORMAL LOW (ref 3.5–5.2)
Sodium: 140 mmol/L (ref 134–144)
eGFR: 30 mL/min/{1.73_m2} — ABNORMAL LOW (ref >59)

## 2022-10-19 NOTE — Addendum Note (Signed)
Addended by: Tylene Fantasia on: 10/19/2022 11:33 AM   Modules accepted: Orders

## 2022-10-19 NOTE — Telephone Encounter (Signed)
SrCr significantly elevated from 1.43 to 2.30. spoke to patient reports other than increasing valsartan dose he has started [place] 5 mg and he was dehydrated when he went for lab. Denies use of OTC NSAID's. Emphasize on keeping up with water intake. Advised to hold valsartan for sat, sun and Monday. Increase hydralazine 25 mg three times daily to 50 mg three times daily and repeat lab on Monday.

## 2022-10-22 ENCOUNTER — Telehealth: Payer: Self-pay | Admitting: Student

## 2022-10-22 NOTE — Telephone Encounter (Signed)
Patient is returning RPH's call stating he came in for labs today.

## 2022-10-22 NOTE — Telephone Encounter (Signed)
Call to remind patient to go for follow up BMP. N/A LVM.

## 2022-10-23 ENCOUNTER — Telehealth: Payer: Self-pay | Admitting: Pharmacist

## 2022-10-23 LAB — BASIC METABOLIC PANEL
BUN/Creatinine Ratio: 16 (ref 10–24)
BUN: 31 mg/dL — ABNORMAL HIGH (ref 8–27)
CO2: 20 mmol/L (ref 20–29)
Calcium: 8.2 mg/dL — ABNORMAL LOW (ref 8.6–10.2)
Chloride: 104 mmol/L (ref 96–106)
Creatinine, Ser: 1.92 mg/dL — ABNORMAL HIGH (ref 0.76–1.27)
Glucose: 212 mg/dL — ABNORMAL HIGH (ref 70–99)
Potassium: 3.7 mmol/L (ref 3.5–5.2)
Sodium: 138 mmol/L (ref 134–144)
eGFR: 37 mL/min/{1.73_m2} — ABNORMAL LOW (ref >59)

## 2022-10-23 NOTE — Telephone Encounter (Signed)
Spoke to patient, discussed BMP. Renal function improving, encourage to continue with well hydration. Advise to continue with hydralazin 50 mg three times daily as BP is ~120-125/68-70 range. Hold on to valsartan and will repeat BMP in a week and re adjust HTN medications if needed in a week.  Follow up in person with PharmD.

## 2022-10-30 ENCOUNTER — Encounter: Payer: Self-pay | Admitting: Pharmacist

## 2022-10-30 ENCOUNTER — Ambulatory Visit: Payer: 59 | Attending: Cardiovascular Disease | Admitting: Pharmacist

## 2022-10-30 ENCOUNTER — Other Ambulatory Visit: Payer: Self-pay | Admitting: Gastroenterology

## 2022-10-30 ENCOUNTER — Telehealth: Payer: Self-pay | Admitting: Pharmacist

## 2022-10-30 VITALS — BP 176/83 | HR 58

## 2022-10-30 DIAGNOSIS — I10 Essential (primary) hypertension: Secondary | ICD-10-CM | POA: Diagnosis not present

## 2022-10-30 NOTE — Telephone Encounter (Signed)
Spoke to the patient, had OV today with the hypertension clinic. According to the OV notes pt was advised to recheck bp at home and call the clinic with his bp reading. Pt stated his bp is 160/80. Will forward to Pharm D .

## 2022-10-30 NOTE — Telephone Encounter (Signed)
Called patient back. Took a nap and checked BP with me on the phone with him. 152/82. Has oncology appointment tomorrow. Will have patient log BP readings until Friday and then contact him and make decision if hydralazine should be increased.

## 2022-10-30 NOTE — Telephone Encounter (Signed)
Was advised to call and give updated BP reading once home. BP -  160/80

## 2022-10-30 NOTE — Progress Notes (Unsigned)
Patient ID: Jesse Lane                 DOB: 1955/10/09                      MRN: [id number]     HPI: Jesse Lane is a 67 y.o. male referred by Juanda Crumble PA-C to HTN clinic. PMH is significant for controlled DM (A1c 6.1), CHF, hypertriglyceridemia, hypertension, CKD, coronary calcium, and malignant melanoma.  Patient presents today in good spirits. Reports BP was controlled until previous 3 months when has increased. Has been checking at home but not writing down results. Says average is ~185/90.  Currently on amlodipine 10mg , lisinopril 40mg , nebivolol 10mg , and prazosin 1mg  at bedtime. Also takes [place] 5mg . Lisinopril was d/c and was started on valsartan at first PharmD visit which was titrated to 320mg . However Scr increased and it was discontiued and he was started on hydralazine 50mg  TID.  Patient presents today and reports he feels well. Reports blood pressure readings in 130s-140s/70s at home. Has stopped using marijuana as much and is drinking more water.  Brought in home cuff. Home cuff reading: 180/91. Cuff does not have memory, can not verify previous readings.  Home BP cuff: 180/91  Current HTN meds:  Amlodipine 10mg  Hydralazine 50mg  TID Nebivolol 10mg   Prazosin 1mg  at bedtime  BP goal: <140/80   Wt Readings from Last 3 Encounters:  08/29/22 176 lb 3.2 oz (79.9 kg)  05/23/22 175 lb 6.4 oz (79.6 kg)  03/14/22 180 lb (81.6 kg)   BP Readings from Last 3 Encounters:  09/07/22 (!) 172/85  08/29/22 (!) 150/82  05/23/22 134/70   Pulse Readings from Last 3 Encounters:  09/07/22 (!) 54  08/29/22 (!) 56  05/23/22 61    Renal function: CrCl cannot be calculated (Patient's most recent lab result is older than the maximum 21 days allowed.).  Past Medical History:  Diagnosis Date   Arthritis    Diabetes mellitus type 2, uncontrolled    Diabetic neuropathy (HCC)    GERD (gastroesophageal reflux disease)    History of stomach ulcers    Hypertension     Melanoma (HCC)    mets to lymph node and intestines    Current Outpatient Medications on File Prior to Visit  Medication Sig Dispense Refill   acetaminophen (TYLENOL) 500 MG tablet Take 1,000 mg by mouth every 6 (six) hours as needed for mild pain.     alendronate (FOSAMAX) 70 MG tablet Take 70 mg by mouth once a week. Wednesday's     amLODipine (NORVASC) 10 MG tablet Take 10 mg by mouth daily.     cyclobenzaprine (FLEXERIL) 10 MG tablet Take 10 mg by mouth at bedtime as needed.     famotidine (PEPCID) 40 MG tablet Take 1 tablet by mouth twice daily 60 tablet 6   FARXIGA 5 MG TABS tablet Take 5 mg by mouth daily.     feeding supplement (ENSURE ENLIVE / ENSURE PLUS) LIQD Take 237 mLs by mouth 2 (two) times daily between meals. 237 mL 12   furosemide (LASIX) 80 MG tablet Take 80 mg by mouth daily.     insulin degludec (TRESIBA FLEXTOUCH) 100 UNIT/ML FlexTouch Pen Inject 25 Units into the skin daily before breakfast.     insulin lispro (HUMALOG) 100 UNIT/ML KwikPen Inject 7-9 Units into the skin 3 (three) times daily. Per sliding scale     Insulin Pen Needle (BD PEN NEEDLE MICRO  U/F) 32G X 6 MM MISC 1 each by Other route See admin instructions. Use one pen needle to inject insulin 4 times daily. **must have appointment for additional refills.** 150 each 0   Insulin Pen Needle (BD PEN NEEDLE MICRO U/F) 32G X 6 MM MISC USE 1 PEN NEEDLE SIX TIMES DAILY (Patient taking differently: 1 each by Other route See admin instructions. USE 1 PEN NEEDLE SIX TIMES DAILY) 200 each 2   loperamide (IMODIUM) 2 MG capsule Take 1 capsule by mouth once daily 30 capsule 3   MELATONIN PO Take 30 mg by mouth at bedtime.     montelukast (SINGULAIR) 10 MG tablet Take 10 mg by mouth at bedtime.     Multiple Vitamin (MULTIVITAMIN WITH MINERALS) TABS tablet Take 1 tablet by mouth daily.     nebivolol (BYSTOLIC) 10 MG tablet Take 1 tablet (10 mg total) by mouth daily. 90 tablet 3   ondansetron (ZOFRAN) 4 MG tablet TAKE 1  TABLET BY MOUTH EVERY 4 TO 6 HOURS AS NEEDED FOR NAUSEA 90 tablet 0   pantoprazole (PROTONIX) 40 MG tablet Take 1 tablet by mouth twice daily 60 tablet 0   potassium chloride SA (KLOR-CON M) 20 MEQ tablet 20 mEq daily 36 tablet 0   prazosin (MINIPRESS) 1 MG capsule Take 1 mg by mouth at bedtime.     pregabalin (LYRICA) 100 MG capsule Take 200-300 mg by mouth See admin instructions. Take 2 capsule (200 mg) by mouth every morning and take three capsules (300 mg) at bedtime as needed for nerve pain     rosuvastatin (CRESTOR) 20 MG tablet Take 1 tablet by mouth once daily 90 tablet 0   sertraline (ZOLOFT) 50 MG tablet Take 50 mg by mouth daily.     traZODone (DESYREL) 100 MG tablet Take 100 mg by mouth at bedtime.  0   No current facility-administered medications on file prior to visit.    Allergies  Allergen Reactions   Contrast Media [Iodinated Contrast Media] Itching     Assessment/Plan:  1. Hypertension - HYPERTENSION CONTROL Vitals:   10/30/22 1323 10/30/22 1325 10/30/22 1327  BP: (!) 185/90 (!) 181/91 (!) 176/83    The patient's blood pressure is elevated above target today. {Click here if intervention needs to be changed Refresh Note :1}  In order to address the patient's elevated BP: Blood pressure will be monitored at home to determine if medication changes need to be made.   Patient BP in room remains elevated at 176/83 however he reports improving readings at home, including some at goal. [place] HTN may be playing a factor. Patient has to pick up girlfriend after appt. Recommended afterwards he return home and call office with his home BP readings before making any medication adjustments. Patient in agreement.  Continue:  Nebivolol 10mg  daily Amlodipine 10mg  daily Hydralazine 50mg  TID Prazosin 1mg  in the evening  Laural Golden, PharmD, BCACP, CDCES, CPP [address], [address] [place], [place], [address] Phone: [phone], Fax: [phone]

## 2022-10-30 NOTE — Patient Instructions (Signed)
It was good seeing you again  We would like your blood pressure to be less than 140/90  Since it is elevated today in the office, take it when you get home and give me a call with your result. If elevated at home also we will make adjustments  Otherwise continue: Amlodipine 10mg  at night Hydralazine 50mg  three times a day Prazosin 1mg  at night Furosemide 80mg  as needed  Laural Golden, PharmD, BCACP, CDCES, CPP [address] [place], [place], [address] Phone: [phone], Fax: [phone]

## 2022-11-20 ENCOUNTER — Telehealth: Payer: Self-pay | Admitting: Cardiovascular Disease

## 2022-11-20 NOTE — Telephone Encounter (Signed)
Patient states had pneumonia  a few weeks ago and thought it was over, but he is completely fatigued.  States he has energy to walk to the bathroom and that's it.  Notes from PCP not available in Epic but he will try to get them sent or bring copies of what he has

## 2022-11-20 NOTE — Telephone Encounter (Signed)
Patient states he has pneumonia and he has been very fatigued. Patient states he just left his PCP and he would like to follow up with Dr. Royann Shivers as well to discuss recommendations.

## 2022-11-20 NOTE — Progress Notes (Unsigned)
Cardiology Clinic Note   Date: 11/22/2022 ID: MERTON OSLER, DOB April 13, 1955, MRN [id number]  Primary Cardiologist:  Thurmon Fair, MD  Patient Profile    Jesse Lane is a 67 y.o. male who presents to the clinic today for fatigue and recent pneumonia.     Past medical history significant for: CAD. Coronary CTA with FFR 05/18/2021: Coronary calcium score 543 (82nd percentile).  Mild to moderate probably mixed calcific and noncalcific CAD.  FFR demonstrates significant discrete stenosis of the mid RCA which is flow-limiting.  There is tapering flow limitation in the mid to distal LAD not necessarily attributable to discrete lesion.  Recommend further evaluation with cardiac catheterization Chronic diastolic heart failure. Echo 04/13/2021: EF 60 to 65%.  No RWMA.  Mild LVH.  Indeterminate diastolic parameters.  Normal RV function.  Trivial MR. Hypertension. Renal artery ultrasound 06/15/2022: Normal size, resistive index, cortical thickness of bilateral kidneys.  No evidence of renal artery stenosis bilaterally.  Normal celiac and superior mesenteric arteries.  Patent IVC. Hyperlipidemia. Lipid panel 08/29/2022: LDL 42, HDL 43, TG 156, total 111. Stroke. GERD. T2DM. CKD stage IIIb. Metastatic melanoma.     History of Present Illness    Jesse Lane  has a known history of diastolic heart failure first diagnosed in July 2021 when echo showed Grade I DD.  Repeat echo December 2022 showed Grade II DD.  Patient has never had a ischemic evaluation.  He was admitted to [facility] long hospital from 04/12/2021 to 04/15/2021 for treatment of acute on chronic heart failure.  He was diuresed and sent home on p.o. Lasix 40 mg twice daily.  Echo during that admission showed normal LV/RV function with indeterminate diastolic parameters.  Patient was first evaluated by Dr. Royann Shivers on 05/04/2021 for congestive heart failure during hospital admission.  Patient presented to the ED on 05/03/2021 with  complaints of chest tightness and lower extremity edema.  He was hypoxic with SpO2 in the 80s and started on 2 L of O2 via [place] with improvement.  Creatinine 1.58 (similar to baseline).  Troponin 21>> 25>> 31.  BNP elevated at 491.2 (this is decreased from 741.4 on 1 4).  Chest x-ray showed bilateral pleural effusions with bibasilar atelectasis, mild cardiomegaly with mild vascular congestion.  EKG with showed sinus rhythm with prolonged PR, RAD (present since 2018).  Patient reported a 2 to 3-day history of increased shortness of breath and ankle swelling.  Good compliance with home Lasix.  Patient was diuresed approximately 6 L and weight decreased approximately 8 pounds.  He was discharged on 05/05/2021.  Patient underwent coronary CTA with FFR which showed significant stenosis of the mid RCA.  Upon discussion with patient he was pain-free and it was felt he would not benefit from revascularization of the RCA.  Aggressive risk factor modification was decided to be the focus.  Patient was last seen in the office by Dr. Royann Shivers on 11/16/2021 for routine follow-up.  Patient underwent hospital admission in May 2023 for dehydration in the setting of diarrhea.  He was tried on SGLT2i but this caused dehydration.  Patient reported dry weight as 180 pounds.  He was doing well at that time and no changes were made.  Patient contacted triage on 11/20/2022.  Per triage LPN: "Patient states he has pneumonia and he has been very fatigued. Patient states he just left his PCP and he would like to follow up with Dr. Royann Shivers as well to discuss recommendations. Patient states had pneumonia a  few weeks ago and thought it was over, but he is completely fatigued. States he has energy to walk to the bathroom and that's it."  Today, patient is accompanied by his partner. He reports 2 weeks ago he had shortness of breath and fatigue. He was evaluated by PCP and told he had pneumonia.  He did not have a chest x-ray.  He was negative  for COVID.  He received IM abx and "some pills." Upon further discussion he thinks he was given a prednisone taper. He began feeling better until 3-4 days ago when he noticed increased fatigue and shortness of breath that is worse when he moves around. He has a dry morning cough but does not cough at any other time.  He denies fever.  No lower extremity edema or increased weight.  He was evaluated by PCP yesterday and received another IM ABX injection, inhalers, and "some pills."  PCP feels he may have a sinus infection.  He did not have another COVID test.  His lungs were clear.  His O2 sat at PCP visit was 84 to 85%.  It was suggested he follow-up with cardiology to evaluate if there is a cardiac cause for his fatigue.  I do not have records from patient's PCP visits.     ROS: All other systems reviewed and are otherwise negative except as noted in History of Present Illness.  Studies Reviewed      EKG is not ordered today.  Risk Assessment/Calculations      HYPERTENSION CONTROL Vitals:   11/21/22 1459 11/21/22 1731  BP: (!) 144/70 (!) 140/70    The patient's blood pressure is elevated above target today.  In order to address the patient's elevated BP: A referral to the [facility] will be placed.           Physical Exam    VS:  BP (!) 140/70 (BP Location: Left Arm, Patient Position: Sitting, Cuff Size: Normal)   Pulse 62   Ht 5\' 11"  (1.803 m)   Wt 192 lb (87.1 kg)   SpO2 90%   BMI 26.78 kg/m  , BMI Body mass index is 26.78 kg/m.  GEN: Well nourished, well developed, in no acute distress. Neck: No JVD or carotid bruits. Cardiac:  RRR. No murmurs. No rubs or gallops.   Respiratory:  Respirations regular and unlabored.  Diminished breath sounds bilateral bases without rales, wheezing or rhonchi. GI: Soft, nontender, nondistended. Extremities: Radials/DP/PT 2+ and equal bilaterally. No clubbing or cyanosis. No edema.  Skin: Warm and dry, no rash. Neuro:  Strength intact.  Assessment & Plan    CAD.  Coronary CTA with FFR February 2023 showed significant stenosis mid RCA.  Patient was symptom-free and decision was made to not undergo revascularization in favor of aggressive risk factor modification. Patient denies chest pain, tightness, or pressure.  Continue Bystolic, rosuvastatin. Fatigue/shortness of breath/DOE/low O2 saturation.  Patient began feeling short of breath and fatigue 2 weeks ago after his partner was sick.  He was seen by PCP and told he had pneumonia (he did not have a chest x-ray).  He was negative for COVID.  He received IM ABX and "some pills" that may have been a prednisone taper.  He began feeling better until 3 to 4 days ago when he noticed increased fatigue and shortness of breath again.  Shortness of breath worse with moving around.  He has a dry cough in the mornings but no continued cough throughout the day.  No fever.  He was reevaluated by PCP yesterday and received another IM ABX injection, inhalers, and "some more pills."  PCP feels he may have a sinus infection.  He did not undergo another COVID test.  Per PCP his lungs were clear to auscultation however his oxygen saturation was 84 to 85%.  It was suggested he follow-up with cardiology to evaluate if there is a cardiac cause for his fatigue.  I do not have records of PCP visits.  On intake today patient's O2 saturation 85%.  During exam it increased to 89 to 90%.  Patient does not have a history of supplemental O2.  He is nontoxic-appearing.  He is able to speak in full sentences without difficulty.  Breath sounds are slightly diminished bilateral bases without rales, wheezing, or rhonchi.  Will get a chest x-ray.  I do not think his symptoms are related to heart failure.  Patient is instructed to follow back up with PCP.  Suggested he undergo another COVID test. Chronic diastolic heart failure.  Echo January 2023 normal LV/RV function, trivial MR.  Patient reports shortness of  breath and DOE since being diagnosed with pneumonia 2 weeks ago.  He denies lower extremity edema, orthopnea, PND.  Euvolemic and well compensated on exam.  Continue Farxiga, furosemide, hydralazine, Bystolic. Hypertension: BP today 144/70 on intake and 140/70 on my recheck.  Patient denies headaches, dizziness or vision changes.  He is working with [employer] hypertension clinic for better BP control.  Continue amlodipine, hydralazine, Bystolic.  Disposition: Chest x-ray.  Follow-up with PCP.  Return for previously scheduled visit with Dr. Royann Shivers in November or sooner as needed.         Signed, Etta Grandchild. , DNP, NP-C

## 2022-11-21 ENCOUNTER — Encounter: Payer: Self-pay | Admitting: Student

## 2022-11-21 ENCOUNTER — Ambulatory Visit: Payer: 59 | Admitting: Student

## 2022-11-21 VITALS — BP 140/70 | HR 62 | Ht 71.0 in | Wt 192.0 lb

## 2022-11-21 DIAGNOSIS — I10 Essential (primary) hypertension: Secondary | ICD-10-CM

## 2022-11-21 DIAGNOSIS — R0609 Other forms of dyspnea: Secondary | ICD-10-CM

## 2022-11-21 DIAGNOSIS — R5383 Other fatigue: Secondary | ICD-10-CM | POA: Diagnosis not present

## 2022-11-21 DIAGNOSIS — I2510 Atherosclerotic heart disease of native coronary artery without angina pectoris: Secondary | ICD-10-CM

## 2022-11-21 DIAGNOSIS — R0602 Shortness of breath: Secondary | ICD-10-CM

## 2022-11-21 DIAGNOSIS — I5032 Chronic diastolic (congestive) heart failure: Secondary | ICD-10-CM

## 2022-11-21 DIAGNOSIS — R7981 Abnormal blood-gas level: Secondary | ICD-10-CM | POA: Diagnosis not present

## 2022-11-21 NOTE — Patient Instructions (Addendum)
Medication Instructions:  If you need a refill on your cardiac medications before your next appointment, please call your pharmacy*   Lab Work: If you have labs (blood work) drawn today and your tests are completely normal, you will receive your results only by: MyChart Message (if you have MyChart) OR A paper copy in the mail If you have any lab test that is abnormal or we need to change your treatment, we will call you to review the results.   Testing/Procedures: A chest x-ray takes a picture of the organs and structures inside the chest, including the heart, lungs, and blood vessels. This test can show several things, including, whether the heart is enlarges; whether fluid is building up in the lungs; and whether pacemaker / defibrillator leads are still in place.    FOLLOW UP WITH YOUR PRIMARY CARE PROVIDER.  Follow-Up: At [facility], you and your health needs are our priority.  As part of our continuing mission to provide you with exceptional heart care, we have created designated Provider Care Teams.  These Care Teams include your primary Cardiologist (physician) and Advanced Practice Providers (APPs -  Physician Assistants and Nurse Practitioners) who all work together to provide you with the care you need, when you need it.  We recommend signing up for the patient portal called "MyChart".  Sign up information is provided on this After Visit Summary.  MyChart is used to connect with patients for Virtual Visits (Telemedicine).  Patients are able to view lab/test results, encounter notes, upcoming appointments, etc.  Non-urgent messages can be sent to your provider as well.   To learn more about what you can do with MyChart, go to [email or web address].    Your next appointment:    KEEP SCHEDULED APPOINTMENT  Provider:   Thurmon Fair, MD

## 2022-11-23 ENCOUNTER — Other Ambulatory Visit: Payer: Self-pay | Admitting: Cardiovascular Disease

## 2022-11-23 DIAGNOSIS — E785 Hyperlipidemia, unspecified: Secondary | ICD-10-CM

## 2022-11-25 ENCOUNTER — Other Ambulatory Visit: Payer: Self-pay | Admitting: Physician Assistant

## 2022-12-20 NOTE — Progress Notes (Unsigned)
[facility] Telephone:([phone]   [phone]) [phone]  CONSULT NOTE  REFERRING PHYSICIAN: Dr. Hal Hope   REASON FOR CONSULTATION:  Pancytopenia, history of metastatic melanoma   HPI Jesse Lane is a 67 y.o. male with a past medical history significant for hypertension, stroke, acute on chronic heart failure, CAD, GERD, Barrett's esophagitis, diabetic neuropathy, type 2 diabetes, CKD, and metastatic melanoma stage IV diagnosed in 2018 (positive for V600E mutation) (treated under the care of Dr. Allyne Gee at [facility]) is referred to the clinic for pancytopenia.  The patient was referred by his PCP.  Of note the patient had been followed by Dr. Allyne Gee at [facility] hematology oncology for his history of metastatic melanoma.  He presented with superficial right axillary subcutaneous lesion and enlarged right axillary node and a right subpectoral lymph node.  The patient completed 2 years of Keytruda 200 mg IV every 3 weeks from 07/12/2016 to 06/10/2018.  His melanoma initially involved right axillary adenopathy.  He had a liver lesion that further was evaluated by MRI that was a focal fatty deposit.  Of note he was positive for BRAF V6 100 E mutation.  Dr. Allyne Gee recommends surveillance scans with CT of the chest, abdomen, pelvis every 6 months without IV contrast as he had severe renal insufficiency after IV contrast at 1 point.  There was some noncompliance with showing up for his last 2 scans.  Patient did not want any further scans unless he is symptomatic.  The patient's PCP had requested IV iron infusions and the patient followed back up with Dr. Allyne Gee from hematology oncology on 11/23/22.  The patient had lab work performed that day but the patient had refused to have this be run at the lab.  Dr. Allyne Gee from hematology oncology was unable to arrange for IV iron infusions given lack of lab work to support the necessity of this and due to requiring insurance  authorization, which is standard for IV therapies to often require insurance approval. ***Patient may have gotten agitated that he could not receive IV iron on the same day as his visit for pancytopenia.  And requested to be worked up in [place].  Recent lab work from 11/20/2022 showed elevated creatinine at 1.69 normocytic anemia with a hemoglobin of 9.7, MCV at 93.8 normal platelet count at 158 and ferritin of 105.  His white blood cell count was normal at 5.5.  His reticulocyte count was [id number]  Regarding the pancytopenia, they noted that he had leukopenia that fluctuated in the past. It typically is mild and resolves. His WBC is typically greater than 3.5.    ***typically has mild hemoglobin which is felt to be secondary to renal insufficiency.  His platelet count fluctuates slightly low to normal over the years.  They recommended B12, folate, ferritin, SPEP, iron studies, reticulocyte count and consideration of bone marrow biopsy if the studies are normal. ***   Per chart review, the patient had a CT of the abdomen pelvis without contrast on 08/19/2021 which did not show any liver parenchymal abnormality.  It looks like his last upper endoscopy and colonoscopy was on 02/15/2020 by Dr. Christella Hartigan.  He had a short segment of Barrett's esophagitis and diffuse mild inflammation in the gastric antrum.  His colonoscopy showed diverticula, and polyps.  His next colonoscopy is due in ***years.  Overall, the patient feels *** today.     He is not sure how long she has had anemia. Per chart review,  he had mild intermittent anemia since 2013.  He has had some periodic anemia.  I see some anemia in May 2018 and he was hospitalized for ***.  He also had some anemia in 2019 that improved or remained mild (slightly below normal >12).  He had some anemia in 2021 2022 but once again his hemoglobin typically stayed greater than the high 11's or 12's.  Some worsening anemia in October 2022 when he was hospitalized for  ***.  The lowest hemoglobin I see at that time is 9.6.  Looks like he has had anemia since that time with hemoglobins in the high nines, tens, elevens, or 12.  He denies any known bleeding or bruising including epistaxis, hemoptysis, hematemesis, melena, or hematochezia.    The patient denies ever needing an iron or blood transfusion***  He denies any known vitamin deficiencies such as iron deficiency, B12, or folate deficiency***. ***Confirm, he may be taking supplements****Ask which ones. Overall he denies*** any fatigue, shortness of breath, or chest pain. He denies any NSAID use or blood thinners.  Denies any history of any stomach ulcers.  The patient denies frequent blood donation.  Denies pica.   Denies any recent or frequent infections. Denies any other frequent or recent viral infections including hepatitis, mono, or HIV.  Denies any alcohol use. Denies any nausea, vomiting, diarrhea, or constipation.  Denies any fever, chills, night sweats, or unexplained weight loss.  Denies any herbal supplement use.  Denies any personal history of autoimmune disorders.   Regarding the thrombocytopenia, the patient denies any bleeding complications from surgery. The thrombocytopenia is not consistently. I see a platelet count of 117k in 2019. There are a few readings of platelet counts in 130-140's. The lowest platelet count is see is 112k. There is one reading of 136 in July 2018.   Denies any aspirin use.  Denies blood thinner use. Denies any immunosuppressive drugs. HPI  Past Medical History:  Diagnosis Date   Arthritis    Diabetes mellitus type 2, uncontrolled    Diabetic neuropathy (HCC)    GERD (gastroesophageal reflux disease)    History of stomach ulcers    Hypertension    Melanoma (HCC)    mets to lymph node and intestines    Past Surgical History:  Procedure Laterality Date   ABCESS DRAINAGE     gluteal area   COLONOSCOPY     INGUINAL HERNIA REPAIR N/A 08/15/2015   Procedure: HERNIA  REPAIR INGUINAL INCARCERATED;  Surgeon: Ovidio Kin, MD;  Location: [facility];  Service: General;  Laterality: N/A;  with MESH   pyloric stenosis     TONSILLECTOMY      Family History  Problem Relation Age of Onset   Breast cancer Mother    Bone cancer Mother    Melanoma Mother    Prostate cancer Father    Pancreatic cancer Maternal Grandfather    Diabetes Paternal Aunt    Colon cancer Neg Hx    Colon polyps Neg Hx    Esophageal cancer Neg Hx    Stomach cancer Neg Hx     Social History Social History   Tobacco Use   Smoking status: Former    Current packs/day: 0.00    Average packs/day: 1 pack/day for 8.0 years (8.0 ttl pk-yrs)    Types: Cigarettes    Start date: 21    Quit date: 1998    Years since quitting: 26.7   Smokeless tobacco: Never  Vaping Use   Vaping status: Never  Used  Substance Use Topics   Alcohol use: Yes    Alcohol/week: 4.0 standard drinks of alcohol    Types: 4 Glasses of wine per week    Comment: occasionally   Drug use: Not Currently    Frequency: 7.0 times per week    Types: Marijuana    Comment: daily    Allergies  Allergen Reactions   Contrast Media [Iodinated Contrast Media] Itching    Current Outpatient Medications  Medication Sig Dispense Refill   acetaminophen (TYLENOL) 500 MG tablet Take 1,000 mg by mouth every 6 (six) hours as needed for mild pain.     alendronate (FOSAMAX) 70 MG tablet Take 70 mg by mouth once a week. Wednesday's     amLODipine (NORVASC) 10 MG tablet Take 10 mg by mouth daily.     cyclobenzaprine (FLEXERIL) 10 MG tablet Take 10 mg by mouth at bedtime as needed.     famotidine (PEPCID) 40 MG tablet Take 1 tablet by mouth twice daily (Patient not taking: Reported on 11/21/2022) 60 tablet 6   FARXIGA 5 MG TABS tablet Take 5 mg by mouth daily.     feeding supplement (ENSURE ENLIVE / ENSURE PLUS) LIQD Take 237 mLs by mouth 2 (two) times daily between meals. (Patient not taking: Reported on 11/21/2022) 237 mL 12    fluconazole (DIFLUCAN) 100 MG tablet Take 100 mg by mouth once.     furosemide (LASIX) 80 MG tablet Take 80 mg by mouth daily.     hydrALAZINE (APRESOLINE) 25 MG tablet Take 1 tablet (25 mg total) by mouth 3 (three) times daily. 270 tablet 3   hydrochlorothiazide (HYDRODIURIL) 25 MG tablet Take 25 mg by mouth daily.     HYDROcodone-acetaminophen (NORCO/VICODIN) 5-325 MG tablet Take 1 tablet by mouth every 6 (six) hours as needed.     insulin degludec (TRESIBA FLEXTOUCH) 100 UNIT/ML FlexTouch Pen Inject 25 Units into the skin daily before breakfast.     insulin lispro (HUMALOG) 100 UNIT/ML KwikPen Inject 7-9 Units into the skin 3 (three) times daily. Per sliding scale     Insulin Pen Needle (BD PEN NEEDLE MICRO U/F) 32G X 6 MM MISC 1 each by Other route See admin instructions. Use one pen needle to inject insulin 4 times daily. **must have appointment for additional refills.** 150 each 0   Insulin Pen Needle (BD PEN NEEDLE MICRO U/F) 32G X 6 MM MISC USE 1 PEN NEEDLE SIX TIMES DAILY (Patient taking differently: 1 each by Other route See admin instructions. USE 1 PEN NEEDLE SIX TIMES DAILY) 200 each 2   loperamide (IMODIUM) 2 MG capsule Take 1 capsule by mouth once daily 30 capsule 3   MELATONIN PO Take 30 mg by mouth at bedtime.     montelukast (SINGULAIR) 10 MG tablet Take 10 mg by mouth at bedtime.     Multiple Vitamin (MULTIVITAMIN WITH MINERALS) TABS tablet Take 1 tablet by mouth daily.     nebivolol (BYSTOLIC) 10 MG tablet Take 1 tablet (10 mg total) by mouth daily. 90 tablet 3   ondansetron (ZOFRAN) 4 MG tablet TAKE 1 TABLET BY MOUTH EVERY 4 TO 6 HOURS AS NEEDED FOR NAUSEA 90 tablet 0   pantoprazole (PROTONIX) 40 MG tablet Take 1 tablet by mouth twice daily 60 tablet 3   potassium chloride SA (KLOR-CON M) 20 MEQ tablet 20 mEq daily 36 tablet 0   prazosin (MINIPRESS) 1 MG capsule Take 1 mg by mouth at bedtime.  pregabalin (LYRICA) 100 MG capsule Take 200-300 mg by mouth See admin  instructions. Take 2 capsule (200 mg) by mouth every morning and take three capsules (300 mg) at bedtime as needed for nerve pain     rosuvastatin (CRESTOR) 20 MG tablet Take 1 tablet by mouth once daily 90 tablet 3   sertraline (ZOLOFT) 50 MG tablet Take 50 mg by mouth daily.     traZODone (DESYREL) 100 MG tablet Take 100 mg by mouth at bedtime.  0   VENTOLIN HFA 108 (90 Base) MCG/ACT inhaler Inhale 1 puff into the lungs every 4 (four) hours as needed.     No current facility-administered medications for this visit.    REVIEW OF SYSTEMS:   Review of Systems  Constitutional: Negative for appetite change, chills, fatigue, fever and unexpected weight change.  HENT:   Negative for mouth sores, nosebleeds, sore throat and trouble swallowing.   Eyes: Negative for eye problems and icterus.  Respiratory: Negative for cough, hemoptysis, shortness of breath and wheezing.   Cardiovascular: Negative for chest pain and leg swelling.  Gastrointestinal: Negative for abdominal pain, constipation, diarrhea, nausea and vomiting.  Genitourinary: Negative for bladder incontinence, difficulty urinating, dysuria, frequency and hematuria.   Musculoskeletal: Negative for back pain, gait problem, neck pain and neck stiffness.  Skin: Negative for itching and rash.  Neurological: Negative for dizziness, extremity weakness, gait problem, headaches, light-headedness and seizures.  Hematological: Negative for adenopathy. Does not bruise/bleed easily.  Psychiatric/Behavioral: Negative for confusion, depression and sleep disturbance. The patient is not nervous/anxious.     PHYSICAL EXAMINATION:  There were no vitals taken for this visit.  ECOG PERFORMANCE STATUS: {CHL ONC ECOG [id number]  Physical Exam  Constitutional: Oriented to person, place, and time and well-developed, well-nourished, and in no distress. No distress.  HENT:  Head: Normocephalic and atraumatic.  Mouth/Throat: Oropharynx is clear and  moist. No oropharyngeal exudate.  Eyes: Conjunctivae are normal. Right eye exhibits no discharge. Left eye exhibits no discharge. No scleral icterus.  Neck: Normal range of motion. Neck supple.  Cardiovascular: Normal rate, regular rhythm, normal heart sounds and intact distal pulses.   Pulmonary/Chest: Effort normal and breath sounds normal. No respiratory distress. No wheezes. No rales.  Abdominal: Soft. Bowel sounds are normal. Exhibits no distension and no mass. There is no tenderness.  Musculoskeletal: Normal range of motion. Exhibits no edema.  Lymphadenopathy:    No cervical adenopathy.  Neurological: Alert and oriented to person, place, and time. Exhibits normal muscle tone. Gait normal. Coordination normal.  Skin: Skin is warm and dry. No rash noted. Not diaphoretic. No erythema. No pallor.  Psychiatric: Mood, memory and judgment normal.  Vitals reviewed.  LABORATORY DATA: Lab Results  Component Value Date   WBC 4.2 08/21/2021   HGB 11.3 (L) 08/21/2021   HCT 31.6 (L) 08/21/2021   MCV 88.0 08/21/2021   PLT 134 (L) 08/21/2021      Chemistry      Component Value Date/Time   NA 138 10/22/2022 1050   K 3.7 10/22/2022 1050   CL 104 10/22/2022 1050   CO2 20 10/22/2022 1050   BUN 31 (H) 10/22/2022 1050   CREATININE 1.92 (H) 10/22/2022 1050      Component Value Date/Time   CALCIUM 8.2 (L) 10/22/2022 1050   ALKPHOS 62 08/20/2021 0334   AST 17 08/20/2021 0334   ALT 15 08/20/2021 0334   BILITOT 2.1 (H) 08/20/2021 0334       RADIOGRAPHIC STUDIES: No results found.  ASSESSMENT: This is a very pleasant 67 year old Caucasian male with anemia and intermittent leukopenia and thrombocytopenia.  He also has a history of metastatic melanoma status post 2 years of immunotherapy with Rande Lawman which was completed in 2020 followed by Dr. Allyne Gee at [facility] hematology oncology.  The patient was referred to the clinic for pancytopenia.  He had several labs today including a  CBC, CMP, LDH, B12, folate, iron studies, ferritin, SPEP, HIV, hepatitis, ANA, and rheumatoid factor drawn today.  His labs today show ***  Mohamed feels that ***  The patient is scheduled to see gastroenterology in a few weeks.  If no clear etiology of his cytopenias, then we agree with Dr. Allyne Gee that bone marrow biopsy and aspirate may be reasonable for peace of mind to ensure there is no bone marrow pathology.  The meantime, he will continue ***taking ***  Discussed the importance of continuing to follow with Dr. Allyne Gee regarding his history of metastatic melanoma and I strongly encouraged him to obtain his annual surveillance CT scans.  The patient voices understanding of current disease status and treatment options and is in agreement with the current care plan.  All questions were answered. The patient knows to call the clinic with any problems, questions or concerns. We can certainly see the patient much sooner if necessary.  Thank you so much for allowing me to participate in the care of Jesse Lane. I will continue to follow up the patient with you and assist in his care.  I spent {CHL ONC TIME VISIT - ZOXWR:[id number]} counseling the patient face to face. The total time spent in the appointment was {CHL ONC TIME VISIT - BJYNW:[id number]}.  Disclaimer: This note was dictated with voice recognition software. Similar sounding words can inadvertently be transcribed and may not be corrected upon review.   Maxum Cassarino L Katheline Brendlinger December 20, 2022, 4:33 PM

## 2022-12-21 ENCOUNTER — Other Ambulatory Visit: Payer: Self-pay | Admitting: Physician Assistant

## 2022-12-21 DIAGNOSIS — D649 Anemia, unspecified: Secondary | ICD-10-CM

## 2022-12-24 ENCOUNTER — Inpatient Hospital Stay (HOSPITAL_BASED_OUTPATIENT_CLINIC_OR_DEPARTMENT_OTHER): Payer: 59 | Admitting: Physician Assistant

## 2022-12-24 ENCOUNTER — Other Ambulatory Visit: Payer: Self-pay

## 2022-12-24 ENCOUNTER — Inpatient Hospital Stay: Payer: 59 | Attending: Physician Assistant

## 2022-12-24 VITALS — BP 130/78 | HR 68 | Temp 97.5°F | Resp 19 | Ht 71.0 in | Wt 178.1 lb

## 2022-12-24 DIAGNOSIS — E1122 Type 2 diabetes mellitus with diabetic chronic kidney disease: Secondary | ICD-10-CM | POA: Insufficient documentation

## 2022-12-24 DIAGNOSIS — Z87891 Personal history of nicotine dependence: Secondary | ICD-10-CM

## 2022-12-24 DIAGNOSIS — Z85820 Personal history of malignant melanoma of skin: Secondary | ICD-10-CM | POA: Diagnosis not present

## 2022-12-24 DIAGNOSIS — Z803 Family history of malignant neoplasm of breast: Secondary | ICD-10-CM | POA: Diagnosis not present

## 2022-12-24 DIAGNOSIS — I130 Hypertensive heart and chronic kidney disease with heart failure and stage 1 through stage 4 chronic kidney disease, or unspecified chronic kidney disease: Secondary | ICD-10-CM | POA: Diagnosis not present

## 2022-12-24 DIAGNOSIS — D61818 Other pancytopenia: Secondary | ICD-10-CM | POA: Diagnosis not present

## 2022-12-24 DIAGNOSIS — Z794 Long term (current) use of insulin: Secondary | ICD-10-CM | POA: Insufficient documentation

## 2022-12-24 DIAGNOSIS — I509 Heart failure, unspecified: Secondary | ICD-10-CM | POA: Diagnosis not present

## 2022-12-24 DIAGNOSIS — N289 Disorder of kidney and ureter, unspecified: Secondary | ICD-10-CM | POA: Diagnosis not present

## 2022-12-24 DIAGNOSIS — Z7984 Long term (current) use of oral hypoglycemic drugs: Secondary | ICD-10-CM | POA: Diagnosis not present

## 2022-12-24 DIAGNOSIS — D649 Anemia, unspecified: Secondary | ICD-10-CM

## 2022-12-24 DIAGNOSIS — Z79899 Other long term (current) drug therapy: Secondary | ICD-10-CM | POA: Insufficient documentation

## 2022-12-24 LAB — CBC WITH DIFFERENTIAL (CANCER CENTER ONLY)
Abs Immature Granulocytes: 0 10*3/uL (ref 0.00–0.07)
Basophils Absolute: 0 10*3/uL (ref 0.0–0.1)
Basophils Relative: 1 %
Eosinophils Absolute: 0 10*3/uL (ref 0.0–0.5)
Eosinophils Relative: 1 %
HCT: 36.2 % — ABNORMAL LOW (ref 39.0–52.0)
Hemoglobin: 12.2 g/dL — ABNORMAL LOW (ref 13.0–17.0)
Immature Granulocytes: 0 %
Lymphocytes Relative: 24 %
Lymphs Abs: 0.8 10*3/uL (ref 0.7–4.0)
MCH: 29.2 pg (ref 26.0–34.0)
MCHC: 33.7 g/dL (ref 30.0–36.0)
MCV: 86.6 fL (ref 80.0–100.0)
Monocytes Absolute: 0.3 10*3/uL (ref 0.1–1.0)
Monocytes Relative: 8 %
Neutro Abs: 2.1 10*3/uL (ref 1.7–7.7)
Neutrophils Relative %: 66 %
Platelet Count: 140 10*3/uL — ABNORMAL LOW (ref 150–400)
RBC: 4.18 MIL/uL — ABNORMAL LOW (ref 4.22–5.81)
RDW: 13.8 % (ref 11.5–15.5)
WBC Count: 3.1 10*3/uL — ABNORMAL LOW (ref 4.0–10.5)
nRBC: 0 % (ref 0.0–0.2)

## 2022-12-24 LAB — CMP (CANCER CENTER ONLY)
ALT: 10 U/L (ref 0–44)
AST: 15 U/L (ref 15–41)
Albumin: 3.6 g/dL (ref 3.5–5.0)
Alkaline Phosphatase: 53 U/L (ref 38–126)
Anion gap: 6 (ref 5–15)
BUN: 17 mg/dL (ref 8–23)
CO2: 25 mmol/L (ref 22–32)
Calcium: 8.3 mg/dL — ABNORMAL LOW (ref 8.9–10.3)
Chloride: 110 mmol/L (ref 98–111)
Creatinine: 1.4 mg/dL — ABNORMAL HIGH (ref 0.61–1.24)
GFR, Estimated: 55 mL/min — ABNORMAL LOW (ref >60)
Glucose, Bld: 128 mg/dL — ABNORMAL HIGH (ref 70–99)
Potassium: 3.4 mmol/L — ABNORMAL LOW (ref 3.5–5.1)
Sodium: 141 mmol/L (ref 135–145)
Total Bilirubin: 0.6 mg/dL (ref 0.3–1.2)
Total Protein: 6.2 g/dL — ABNORMAL LOW (ref 6.5–8.1)

## 2022-12-24 LAB — FERRITIN: Ferritin: 72 ng/mL (ref 24–336)

## 2022-12-24 LAB — IRON AND IRON BINDING CAPACITY (CC-WL,HP ONLY)
Iron: 60 ug/dL (ref 45–182)
Saturation Ratios: 22 % (ref 17.9–39.5)
TIBC: 273 ug/dL (ref 250–450)
UIBC: 213 ug/dL (ref 117–376)

## 2022-12-24 LAB — VITAMIN B12: Vitamin B-12: 417 pg/mL (ref 180–914)

## 2022-12-24 LAB — FOLATE: Folate: 10.8 ng/mL (ref >5.9)

## 2022-12-28 ENCOUNTER — Other Ambulatory Visit: Payer: Self-pay | Admitting: Physician Assistant

## 2023-01-02 ENCOUNTER — Other Ambulatory Visit: Payer: Self-pay | Admitting: Physician Assistant

## 2023-01-02 DIAGNOSIS — R778 Other specified abnormalities of plasma proteins: Secondary | ICD-10-CM

## 2023-01-03 LAB — IMMUNOFIXATION REFLEX, SERUM
IgA: 196 mg/dL (ref 61–437)
IgG (Immunoglobin G), Serum: 1150 mg/dL (ref 603–1613)
IgM (Immunoglobulin M), Srm: 206 mg/dL — ABNORMAL HIGH (ref 20–172)

## 2023-01-03 LAB — PROTEIN ELECTROPHORESIS, SERUM, WITH REFLEX
A/G Ratio: 1.1 (ref 0.7–1.7)
Albumin ELP: 3.1 g/dL (ref 2.9–4.4)
Alpha-1-Globulin: 0.3 g/dL (ref 0.0–0.4)
Alpha-2-Globulin: 0.5 g/dL (ref 0.4–1.0)
Beta Globulin: 1.2 g/dL (ref 0.7–1.3)
Gamma Globulin: 0.7 g/dL (ref 0.4–1.8)
Globulin, Total: 2.7 g/dL (ref 2.2–3.9)
M-Spike, %: 0.4 g/dL — ABNORMAL HIGH
SPEP Interpretation: 0
Total Protein ELP: 5.8 g/dL — ABNORMAL LOW (ref 6.0–8.5)

## 2023-01-09 ENCOUNTER — Ambulatory Visit (INDEPENDENT_AMBULATORY_CARE_PROVIDER_SITE_OTHER): Payer: 59 | Admitting: Physician Assistant

## 2023-01-09 ENCOUNTER — Encounter: Payer: Self-pay | Admitting: Physician Assistant

## 2023-01-09 VITALS — BP 130/68 | HR 59 | Ht 71.0 in | Wt 176.2 lb

## 2023-01-09 DIAGNOSIS — K2270 Barrett's esophagus without dysplasia: Secondary | ICD-10-CM

## 2023-01-09 DIAGNOSIS — R141 Gas pain: Secondary | ICD-10-CM

## 2023-01-09 DIAGNOSIS — R110 Nausea: Secondary | ICD-10-CM | POA: Diagnosis not present

## 2023-01-09 DIAGNOSIS — K219 Gastro-esophageal reflux disease without esophagitis: Secondary | ICD-10-CM

## 2023-01-09 DIAGNOSIS — R142 Eructation: Secondary | ICD-10-CM

## 2023-01-09 DIAGNOSIS — R143 Flatulence: Secondary | ICD-10-CM

## 2023-01-09 DIAGNOSIS — R197 Diarrhea, unspecified: Secondary | ICD-10-CM | POA: Diagnosis not present

## 2023-01-09 MED ORDER — ONDANSETRON 4 MG PO TBDP: 4.0000 mg | ORAL_TABLET | Freq: Four times a day (QID) | ORAL | 5 refills | Status: DC | PRN

## 2023-01-09 MED ORDER — NA SULFATE-K SULFATE-MG SULF 17.5-3.13-1.6 GM/177ML PO SOLN: 1.0000 | Freq: Once | ORAL | 0 refills | Status: AC

## 2023-01-09 NOTE — Progress Notes (Signed)
Attending Physician's Attestation   I have reviewed the chart.   I agree with the Advanced Practitioner's note, impression, and recommendations with any updates as below.    Emersyn Wyss Mansouraty, MD [facility] Office # [phone]  

## 2023-01-09 NOTE — Progress Notes (Signed)
Chief Complaint: Diarrhea  HPI:    [name] is a 67 year old male with a past medical history significant for CVA (04/13/2021 echo with LVEF 60-65%), diagnostic heart failure, malignant melanoma, diabetes, hypertension, hyperlipidemia, prior PUD, Barrett's esophagus, GERD and multiple others, assigned to Dr. Meridee Score, who presents to clinic today for complaint of diarrhea.    02/15/2020 EGD with short segment nonnodular Barrett's change and gastritis.  Repeat recommended in 3 years.    02/15/2020 colonoscopy with diverticulosis in the entire colon, 5 to-6 mm polyps in the sigmoid, transverse and ascending colon.  Pathology showed adenomatous polyps and repeat recommended in 5 years.    08/2021 CT that and pelvis without contrast showed no acute findings in the abdomen or pelvis.    03/14/2022 patient seen in clinic by Dr. Meridee Score at that time his symptoms were overall stable, he continued to use Loperamide as needed for bowel habit changes.  Also Protonix and occasionally Pepcid.  At that time discussed further evaluation for EPI as well as SIBO.  Also discussed repeat colonoscopic evaluation to ensure no microscopic/collagenous colitis.  Discussed he would be due for colon polyp surveillance in 2024.  Discussed follow-up endoscopy in 2024 for Barrett's esophagus.  Discussed celiac testing if above workup was negative.    03/19/2022 pancreatic fecal elastase was normal.  At that time recommended following up with SIBO breath testing if he had not.    11/21/2022 patient seen by cardiology for shortness of breath.  They did not think it was related to his heart failure.  He was euvolemic.  They recommended a chest x-ray, does not look like this was done.    12/24/2022 CMP with a potassium of 3.4, glucose elevated 128.  CBC with a hemoglobin of 12.2, white count 3.1.    Today, patient presents to clinic and tells me he has been trying to get in here for a while.  He has been having issues with vomiting  in the morning for the past 3 to 4 months.  Tells me that when he wakes up he actually feels okay he just has to pee but then while he is peeing he becomes nauseous and almost immediately has to vomit.  Often times that it is clear liquid and not actual food.  He continues his Pantoprazole 40 mg twice daily and Pepcid 40 mg twice daily but is taking his nighttime Pepcid dose before dinner with his Pantoprazole instead of before bedtime.  He is on Zofran 4 mg which he uses as needed, these are not dissolvable tabs.  Never completed his breath test.  Continues with occasional episodes of diarrhea couple times a month which are controlled with as needed Loperamide.    Denies fever, chills or weight loss.  Past Medical History:  Diagnosis Date   Arthritis    Diabetes mellitus type 2, uncontrolled    Diabetic neuropathy (HCC)    GERD (gastroesophageal reflux disease)    History of stomach ulcers    Hypertension    Melanoma (HCC)    mets to lymph node and intestines    Past Surgical History:  Procedure Laterality Date   ABCESS DRAINAGE     gluteal area   COLONOSCOPY     INGUINAL HERNIA REPAIR N/A 08/15/2015   Procedure: HERNIA REPAIR INGUINAL INCARCERATED;  Surgeon: Ovidio Kin, MD;  Location: [facility];  Service: General;  Laterality: N/A;  with MESH   pyloric stenosis     TONSILLECTOMY      Current  Outpatient Medications  Medication Sig Dispense Refill   acetaminophen (TYLENOL) 500 MG tablet Take 1,000 mg by mouth every 6 (six) hours as needed for mild pain.     alendronate (FOSAMAX) 70 MG tablet Take 70 mg by mouth once a week. Wednesday's (Patient not taking: Reported on 12/24/2022)     amLODipine (NORVASC) 10 MG tablet Take 10 mg by mouth daily.     cyclobenzaprine (FLEXERIL) 10 MG tablet Take 10 mg by mouth at bedtime as needed.     famotidine (PEPCID) 40 MG tablet Take 1 tablet by mouth twice daily 60 tablet 6   FARXIGA 5 MG TABS tablet Take 5 mg by mouth daily.     feeding supplement  (ENSURE ENLIVE / ENSURE PLUS) LIQD Take 237 mLs by mouth 2 (two) times daily between meals. (Patient not taking: Reported on 11/21/2022) 237 mL 12   fluconazole (DIFLUCAN) 100 MG tablet Take 100 mg by mouth once.     furosemide (LASIX) 80 MG tablet Take 80 mg by mouth daily.     hydrALAZINE (APRESOLINE) 25 MG tablet Take 1 tablet (25 mg total) by mouth 3 (three) times daily. 270 tablet 3   hydrochlorothiazide (HYDRODIURIL) 25 MG tablet Take 25 mg by mouth daily.     HYDROcodone-acetaminophen (NORCO/VICODIN) 5-325 MG tablet Take 1 tablet by mouth every 6 (six) hours as needed.     insulin degludec (TRESIBA FLEXTOUCH) 100 UNIT/ML FlexTouch Pen Inject 25 Units into the skin daily before breakfast.     insulin lispro (HUMALOG) 100 UNIT/ML KwikPen Inject 7-9 Units into the skin 3 (three) times daily. Per sliding scale     Insulin Pen Needle (BD PEN NEEDLE MICRO U/F) 32G X 6 MM MISC 1 each by Other route See admin instructions. Use one pen needle to inject insulin 4 times daily. **must have appointment for additional refills.** 150 each 0   Insulin Pen Needle (BD PEN NEEDLE MICRO U/F) 32G X 6 MM MISC USE 1 PEN NEEDLE SIX TIMES DAILY (Patient taking differently: 1 each by Other route See admin instructions. USE 1 PEN NEEDLE SIX TIMES DAILY) 200 each 2   Iron-Folic Acid-Vit B12 (IRON FORMULA PO) Take by mouth daily.     loperamide (IMODIUM) 2 MG capsule Take 1 capsule by mouth once daily (Patient taking differently: Take 2 mg by mouth as needed.) 30 capsule 3   MELATONIN PO Take 30 mg by mouth at bedtime.     montelukast (SINGULAIR) 10 MG tablet Take 10 mg by mouth at bedtime.     Multiple Vitamin (MULTIVITAMIN WITH MINERALS) TABS tablet Take 1 tablet by mouth daily.     nebivolol (BYSTOLIC) 10 MG tablet Take 1 tablet (10 mg total) by mouth daily. 90 tablet 3   ondansetron (ZOFRAN) 4 MG tablet TAKE 1 TABLET BY MOUTH EVERY 4 TO 6 HOURS AS NEEDED FOR NAUSEA 90 tablet 0   pantoprazole (PROTONIX) 40 MG tablet  Take 1 tablet by mouth twice daily 60 tablet 3   potassium chloride SA (KLOR-CON M) 20 MEQ tablet 20 mEq daily 36 tablet 0   prazosin (MINIPRESS) 1 MG capsule Take 1 mg by mouth at bedtime.     pregabalin (LYRICA) 100 MG capsule Take 200-300 mg by mouth See admin instructions. Take 2 capsule (200 mg) by mouth every morning and take three capsules (300 mg) at bedtime as needed for nerve pain     rosuvastatin (CRESTOR) 20 MG tablet Take 1 tablet by mouth once daily 90  tablet 3   sertraline (ZOLOFT) 50 MG tablet Take 50 mg by mouth daily.     traZODone (DESYREL) 100 MG tablet Take 100 mg by mouth at bedtime.  0   VENTOLIN HFA 108 (90 Base) MCG/ACT inhaler Inhale 1 puff into the lungs every 4 (four) hours as needed.     No current facility-administered medications for this visit.    Allergies as of 01/09/2023 - Review Complete 12/24/2022  Allergen Reaction Noted   Contrast media [iodinated contrast media] Itching 10/12/2019    Family History  Problem Relation Age of Onset   Breast cancer Mother    Bone cancer Mother    Melanoma Mother    Prostate cancer Father    Pancreatic cancer Maternal Grandfather    Diabetes Paternal Aunt    Colon cancer Neg Hx    Colon polyps Neg Hx    Esophageal cancer Neg Hx    Stomach cancer Neg Hx     Social History   Socioeconomic History   Marital status: Significant Other    Spouse name: Sharion Dove- S/O   Number of children: 1   Years of education: Not on file   Highest education level: Some college, no degree  Occupational History   Occupation: retired  Tobacco Use   Smoking status: Former    Current packs/day: 0.00    Average packs/day: 1 pack/day for 8.0 years (8.0 ttl pk-yrs)    Types: Cigarettes    Start date: 61    Quit date: 1998    Years since quitting: 26.7   Smokeless tobacco: Never  Vaping Use   Vaping status: Never Used  Substance and Sexual Activity   Alcohol use: Yes    Alcohol/week: 4.0 standard drinks of alcohol     Types: 4 Glasses of wine per week    Comment: occasionally   Drug use: Not Currently    Frequency: 7.0 times per week    Types: Marijuana    Comment: daily   Sexual activity: Not on file  Other Topics Concern   Not on file  Social History Narrative   Not on file   Social Determinants of Health   Financial Resource Strain: High Risk (05/04/2021)   Overall Financial Resource Strain (CARDIA)    Difficulty of Paying Living Expenses: Hard  Food Insecurity: Low Risk  (01/09/2023)   Received from [facility]   Hunger Vital Sign    Worried About Running Out of Food in the Last Year: Never true    Ran Out of Food in the Last Year: Never true  Transportation Needs: No Transportation Needs (01/09/2023)   Received from [facility]    In the past 12 months, has lack of reliable transportation kept you from medical appointments, meetings, work or from getting things needed for daily living? : No  Physical Activity: Not on file  Stress: Not on file  Social Connections: Not on file  Intimate Partner Violence: Not on file    Review of Systems:    Constitutional: No weight loss, fever or chills Cardiovascular: No chest pain  Respiratory: No cough Gastrointestinal: See HPI and otherwise negative   Physical Exam:  Vital signs: BP 130/68   Pulse (!) 59   Ht 5\' 11"  (1.803 m)   Wt 176 lb 3.2 oz (79.9 kg)   BMI 24.57 kg/m    Constitutional:   Pleasant Caucasian male appears to be in NAD, Well developed, Well nourished, alert and cooperative Respiratory: Respirations  even and unlabored. Lungs clear to auscultation bilaterally.   No wheezes, crackles, or rhonchi.  Cardiovascular: Normal S1, S2. No MRG. Regular rate and rhythm. No peripheral edema, cyanosis or pallor.  Gastrointestinal:  Soft, nondistended, nontender. No rebound or guarding. Increased BS all four quadrants, No appreciable masses or hepatomegaly. Rectal:  Not performed.  Psychiatric: Oriented to person,  place and time. Demonstrates good judgement and reason without abnormal affect or behaviors.  RELEVANT LABS AND IMAGING: CBC    Component Value Date/Time   WBC 3.1 (L) 12/24/2022 1305   WBC 4.2 08/21/2021 1132   RBC 4.18 (L) 12/24/2022 1305   HGB 12.2 (L) 12/24/2022 1305   HCT 36.2 (L) 12/24/2022 1305   PLT 140 (L) 12/24/2022 1305   MCV 86.6 12/24/2022 1305   MCH 29.2 12/24/2022 1305   MCHC 33.7 12/24/2022 1305   RDW 13.8 12/24/2022 1305   LYMPHSABS 0.8 12/24/2022 1305   MONOABS 0.3 12/24/2022 1305   EOSABS 0.0 12/24/2022 1305   BASOSABS 0.0 12/24/2022 1305    CMP     Component Value Date/Time   NA 141 12/24/2022 1305   NA 138 10/22/2022 1050   K 3.4 (L) 12/24/2022 1305   CL 110 12/24/2022 1305   CO2 25 12/24/2022 1305   GLUCOSE 128 (H) 12/24/2022 1305   BUN 17 12/24/2022 1305   BUN 31 (H) 10/22/2022 1050   CREATININE 1.40 (H) 12/24/2022 1305   CALCIUM 8.3 (L) 12/24/2022 1305   PROT 6.2 (L) 12/24/2022 1305   ALBUMIN 3.6 12/24/2022 1305   AST 15 12/24/2022 1305   ALT 10 12/24/2022 1305   ALKPHOS 53 12/24/2022 1305   BILITOT 0.6 12/24/2022 1305   GFRNONAA 55 (L) 12/24/2022 1305   GFRAA >60 10/18/2019 0339    Assessment: 1.  Nausea and vomiting: Over the past 3 to 4 months typically after urinating in the morning, typically not food just to clear substance, continues on meds for gastritis; concern for micturition syncope versus gastritis versus other 2.  Chronic diarrhea: Occurs 2-3 times a month, patient controlled with as needed loperamide, previously recommended a SIBO test which she has not completed 3.  Gas/eructations: Consider relation to above 4.  Barrett's esophagus: Last EGD in 2021 with repeat recommended now 5.  History of shortness of breath: Recently seen by cardiology sho do not think this heart failure related, SpO2 in clinic today is 98, no shortness of breath described today  Plan: 1.  Scheduled patient for an EGD given history of Barrett's  esophagus and now nausea/vomiting as well as a diagnostic colonoscopy given chronic diarrhea to assess for microscopic colitis.  Did provide the patient a detailed list of risks for the procedures and he agrees to proceed. Patient is appropriate for endoscopic procedure(s) in the ambulatory ([facility]) setting.  2.  Would recommend the patient complete testing for SIBO as recommended by Dr. Meridee Score. 3.  Continue Pantoprazole 40 mg twice daily, 30-60 minutes before breakfast and dinner. 4.  Continue Pepcid 40 mg every morning and move nighttime of Pepcid before going to sleep. 5.  Prescribed Ondansetron sublingual tabs that the patient can try taking 1 of these upon waking in the morning on his way to the bathroom to see if this helps at all with his nausea/vomiting.  4 mg, 1-2 tabs every 6 hours #30 with 3 refills. 6.  Patient to follow in clinic per recommendations after testing above.  Hyacinth Meeker, PA-C [facility] 01/09/2023, 2:02 PM  Cc: Nadyne Coombes  L, MD

## 2023-01-09 NOTE — Patient Instructions (Addendum)
_______________________________________________________  If your blood pressure at your visit was 140/90 or greater, please contact your primary care physician to follow up on this.  _______________________________________________________  If you are age 68 or older, your body mass index should be between 23-30. Your Body mass index is 24.57 kg/m. If this is out of the aforementioned range listed, please consider follow up with your Primary Care Provider.  If you are age 97 or younger, your body mass index should be between 19-25. Your Body mass index is 24.57 kg/m. If this is out of the aformentioned range listed, please consider follow up with your Primary Care Provider.   ________________________________________________________  The [facility] providers would like to encourage you to use [facility] to communicate with providers for non-urgent requests or questions.  Due to long hold times on the telephone, sending your provider a message by [facility] may be a faster and more efficient way to get a response.  Please allow 48 business hours for a response.  Please remember that this is for non-urgent requests.  _______________________________________________________  Please take your second dose of Pepcid at bedtime instead of dinner  We have sent the following medications to your pharmacy for you to pick up at your convenience: Suprep Zofran 4mg  1-2 tablet every 6 hours as needed  You have been given a testing kit to check for small intestine bacterial overgrowth (SIBO) which is completed by a company named [facility]. Make sure to return your test in the mail using the return mailing label given to you along with the kit. The test order, your demographic and insurance information have all already been sent to the company. [facility] will collect an upfront charge of $99.74 for commercial insurance plans and $209.74 if you are paying cash. Make sure to discuss with [facility] PRIOR  to having the test to see if they have gotten information from your insurance company as to how much your testing will cost out of pocket, if any. Please contact [facility] at phone number [phone] to get instructions regarding how to perform the test as our office is unable to give specific testing instructions.  You have been scheduled for an endoscopy and colonoscopy. Please follow the written instructions given to you at your visit today.  Please pick up your prep supplies at the pharmacy within the next 1-3 days.  If you use inhalers (even only as needed), please bring them with you on the day of your procedure.  DO NOT TAKE 7 DAYS PRIOR TO TEST- Trulicity (dulaglutide) Ozempic, Wegovy (semaglutide) Mounjaro (tirzepatide) Bydureon Bcise (exanatide extended release)  DO NOT TAKE 1 DAY PRIOR TO YOUR TEST Rybelsus (semaglutide) Adlyxin (lixisenatide) Victoza (liraglutide) Byetta (exanatide) ___________________________________________________________________________  It was a pleasure to see you today!  Thank you for trusting me with your gastrointestinal care!

## 2023-01-16 ENCOUNTER — Other Ambulatory Visit: Payer: Self-pay | Admitting: Physician Assistant

## 2023-02-04 ENCOUNTER — Telehealth: Payer: Self-pay | Admitting: Cardiovascular Disease

## 2023-02-04 DIAGNOSIS — I10 Essential (primary) hypertension: Secondary | ICD-10-CM

## 2023-02-04 MED ORDER — HYDRALAZINE HCL 50 MG PO TABS: 50.0000 mg | ORAL_TABLET | Freq: Three times a day (TID) | ORAL | 3 refills | Status: DC

## 2023-02-04 NOTE — Telephone Encounter (Signed)
From 10/30/22 office visit with Laural Golden  It was good seeing you again   We would like your blood pressure to be less than 140/90   Since it is elevated today in the office, take it when you get home and give me a call with your result. If elevated at home also we will make adjustments   Otherwise continue: Amlodipine 10mg  at night Hydralazine 50mg  three times a day Prazosin 1mg  at night Furosemide 80mg  as needed   Laural Golden, PharmD, BCACP, CDCES, CPP [address] [place], [place], [address] Phone: [phone], Fax: [phone]      Called patient. Advised Rx was sent in

## 2023-02-04 NOTE — Telephone Encounter (Signed)
  Pt c/o medication issue:  1. Name of Medication: hydrALAZINE (APRESOLINE) 25 MG tablet  2. How are you currently taking this medication (dosage and times per day)?   3. Are you having a reaction (difficulty breathing--STAT)?   4. What is your medication issue?  Script has changed to 50mg  3 times a day per pt at last visit. Need new script sent to pharmacy

## 2023-02-11 ENCOUNTER — Encounter (HOSPITAL_COMMUNITY): Payer: Self-pay

## 2023-02-11 ENCOUNTER — Emergency Department (HOSPITAL_COMMUNITY)
Admission: EM | Admit: 2023-02-11 | Discharge: 2023-02-12 | Disposition: A | Discharge status: Home / Self Care | Payer: 59 | Attending: Emergency Medicine | Admitting: Emergency Medicine

## 2023-02-11 ENCOUNTER — Other Ambulatory Visit: Payer: Self-pay

## 2023-02-11 DIAGNOSIS — E876 Hypokalemia: Secondary | ICD-10-CM

## 2023-02-11 DIAGNOSIS — I10 Essential (primary) hypertension: Secondary | ICD-10-CM | POA: Insufficient documentation

## 2023-02-11 DIAGNOSIS — E162 Hypoglycemia, unspecified: Secondary | ICD-10-CM

## 2023-02-11 DIAGNOSIS — Z794 Long term (current) use of insulin: Secondary | ICD-10-CM | POA: Insufficient documentation

## 2023-02-11 DIAGNOSIS — Z79899 Other long term (current) drug therapy: Secondary | ICD-10-CM | POA: Insufficient documentation

## 2023-02-11 DIAGNOSIS — E10649 Type 1 diabetes mellitus with hypoglycemia without coma: Secondary | ICD-10-CM | POA: Diagnosis not present

## 2023-02-11 LAB — CBC WITH DIFFERENTIAL/PLATELET
Abs Immature Granulocytes: 0.01 10*3/uL (ref 0.00–0.07)
Basophils Absolute: 0 10*3/uL (ref 0.0–0.1)
Basophils Relative: 1 %
Eosinophils Absolute: 0 10*3/uL (ref 0.0–0.5)
Eosinophils Relative: 0 %
HCT: 35.5 % — ABNORMAL LOW (ref 39.0–52.0)
Hemoglobin: 12 g/dL — ABNORMAL LOW (ref 13.0–17.0)
Immature Granulocytes: 0 %
Lymphocytes Relative: 19 %
Lymphs Abs: 0.7 10*3/uL (ref 0.7–4.0)
MCH: 29.1 pg (ref 26.0–34.0)
MCHC: 33.8 g/dL (ref 30.0–36.0)
MCV: 86 fL (ref 80.0–100.0)
Monocytes Absolute: 0.3 10*3/uL (ref 0.1–1.0)
Monocytes Relative: 7 %
Neutro Abs: 2.6 10*3/uL (ref 1.7–7.7)
Neutrophils Relative %: 73 %
Platelets: 153 10*3/uL (ref 150–400)
RBC: 4.13 MIL/uL — ABNORMAL LOW (ref 4.22–5.81)
RDW: 14.6 % (ref 11.5–15.5)
WBC: 3.5 10*3/uL — ABNORMAL LOW (ref 4.0–10.5)
nRBC: 0 % (ref 0.0–0.2)

## 2023-02-11 LAB — CBG MONITORING, ED: Glucose-Capillary: 151 mg/dL — ABNORMAL HIGH (ref 70–99)

## 2023-02-11 NOTE — ED Triage Notes (Signed)
Pt BIB EMS from home for hypoglycemia. Per EMS, pt stated he's been in the 200s for his CBG for the last two days. Pt thinks he took extra insulin to bring his CBG down and it went to 44. Pt was given 15g oral glucose which brought it up to 48, then 25g D10 which increased to 241.  Pt A&Ox4   150/80 52 hr regular  20g LAC

## 2023-02-12 DIAGNOSIS — E10649 Type 1 diabetes mellitus with hypoglycemia without coma: Secondary | ICD-10-CM | POA: Diagnosis not present

## 2023-02-12 LAB — COMPREHENSIVE METABOLIC PANEL
ALT: 11 U/L (ref 0–44)
AST: 17 U/L (ref 15–41)
Albumin: 2.6 g/dL — ABNORMAL LOW (ref 3.5–5.0)
Alkaline Phosphatase: 48 U/L (ref 38–126)
Anion gap: 7 (ref 5–15)
BUN: 17 mg/dL (ref 8–23)
CO2: 23 mmol/L (ref 22–32)
Calcium: 7.4 mg/dL — ABNORMAL LOW (ref 8.9–10.3)
Chloride: 106 mmol/L (ref 98–111)
Creatinine, Ser: 1.47 mg/dL — ABNORMAL HIGH (ref 0.61–1.24)
GFR, Estimated: 52 mL/min — ABNORMAL LOW (ref >60)
Glucose, Bld: 175 mg/dL — ABNORMAL HIGH (ref 70–99)
Potassium: 2.9 mmol/L — ABNORMAL LOW (ref 3.5–5.1)
Sodium: 136 mmol/L (ref 135–145)
Total Bilirubin: 0.6 mg/dL (ref <1.2)
Total Protein: 5.5 g/dL — ABNORMAL LOW (ref 6.5–8.1)

## 2023-02-12 LAB — CBG MONITORING, ED
Glucose-Capillary: 119 mg/dL — ABNORMAL HIGH (ref 70–99)
Glucose-Capillary: 131 mg/dL — ABNORMAL HIGH (ref 70–99)
Glucose-Capillary: 137 mg/dL — ABNORMAL HIGH (ref 70–99)
Glucose-Capillary: 137 mg/dL — ABNORMAL HIGH (ref 70–99)
Glucose-Capillary: 160 mg/dL — ABNORMAL HIGH (ref 70–99)
Glucose-Capillary: 177 mg/dL — ABNORMAL HIGH (ref 70–99)
Glucose-Capillary: 85 mg/dL (ref 70–99)

## 2023-02-12 MED ORDER — HYDRALAZINE HCL 25 MG PO TABS
50.0000 mg | ORAL_TABLET | Freq: Once | ORAL | Status: AC
  Administered 2023-02-12: 50 mg via ORAL
  Filled 2023-02-12: qty 2

## 2023-02-12 MED ORDER — POTASSIUM CHLORIDE CRYS ER 20 MEQ PO TBCR
40.0000 meq | EXTENDED_RELEASE_TABLET | Freq: Once | ORAL | Status: AC
  Administered 2023-02-12: 40 meq via ORAL
  Filled 2023-02-12: qty 2

## 2023-02-12 NOTE — ED Notes (Signed)
Patient was given sandwich and something to drink. Patient voiced no other needs except a pillow if possible.

## 2023-02-12 NOTE — ED Provider Notes (Addendum)
[place] EMERGENCY DEPARTMENT AT [facility] Provider Note   CSN: [id number] Arrival date & time: 02/11/23  2300     History  Chief Complaint  Patient presents with   Hypoglycemia    Jesse Lane is a 67 y.o. male.  The history is provided by the patient.  Hypoglycemia Jesse Lane is a 67 y.o. male who presents to the Emergency Department complaining of hypoglycemia.  He presents to the emergency department for evaluation of hypoglycemia.  He states that his sugar was in the mid 200s and he took extra insulin and accidentally took too much.  He took 14 units of Humalog and he is supposed to take 5 units for blood sugar that high.  He also has had some diarrhea today with 4 loose nonbloody stools.  No abdominal pain.  No fever, nausea, vomiting.  He has a history of type 1 diabetes, hypertension.  He takes NovoLog and [place].  He is sure that he did not take extra [place].  He does drink occasional alcohol. No SI, HI.    Home Medications Prior to Admission medications   Medication Sig Start Date End Date Taking? Authorizing Provider  acetaminophen (TYLENOL) 500 MG tablet Take 1,000 mg by mouth every 6 (six) hours as needed for mild pain.   Yes [provider]  famotidine (PEPCID) 40 MG tablet Take 1 tablet by mouth twice daily 07/09/22  Yes Lemmon, Violet Baldy, PA  FARXIGA 5 MG TABS tablet Take 5 mg by mouth daily. 05/07/22  Yes [provider]  furosemide (LASIX) 80 MG tablet Take 80 mg by mouth daily. 11/11/21  Yes [provider]  hydrALAZINE (APRESOLINE) 50 MG tablet Take 1 tablet (50 mg total) by mouth 3 (three) times daily. 02/04/23  Yes Croitoru, Mihai, MD  insulin degludec (TRESIBA FLEXTOUCH) 100 UNIT/ML FlexTouch Pen Inject 20 Units into the skin daily before breakfast.   Yes [provider]  insulin lispro (HUMALOG) 100 UNIT/ML KwikPen Inject 5 Units into the skin 3 (three) times daily. Per sliding scale   Yes [provider]  Iron-Folic Acid-Vit B12 (IRON FORMULA PO) Take by mouth daily.   Yes [provider]  loperamide (IMODIUM) 2 MG capsule Take 1 capsule by mouth once daily 01/16/23  Yes Lemmon, Violet Baldy, PA  MELATONIN PO Take 30 mg by mouth at bedtime.   Yes [provider]  montelukast (SINGULAIR) 10 MG tablet Take 10 mg by mouth at bedtime. 10/13/19  Yes [provider]  Multiple Vitamin (MULTIVITAMIN WITH MINERALS) TABS tablet Take 1 tablet by mouth daily.   Yes [provider]  Na Sulfate-K Sulfate-Mg Sulf 17.5-3.13-1.6 GM/177ML SOLN Take by mouth once. 01/09/23  Yes [provider]  nebivolol (BYSTOLIC) 10 MG tablet Take 1 tablet (10 mg total) by mouth daily. 08/29/22  Yes Juanda Crumble K, PA-C  ondansetron (ZOFRAN-ODT) 4 MG disintegrating tablet Take 1-2 tablets (4-8 mg total) by mouth every 6 (six) hours as needed for nausea. 01/09/23  Yes Unk Lightning, PA  pantoprazole (PROTONIX) 40 MG tablet Take 1 tablet by mouth twice daily 11/26/22  Yes Lemmon, Violet Baldy, PA  potassium chloride SA (KLOR-CON M) 20 MEQ tablet 20 mEq daily 04/15/21  Yes Wendling, Jilda Roche, DO  prazosin (MINIPRESS) 1 MG capsule Take 1 mg by mouth at bedtime. 05/17/22  Yes [provider]  rosuvastatin (CRESTOR) 20 MG tablet Take 1 tablet by mouth once daily 11/23/22  Yes Carlos Levering, NP  sertraline (ZOLOFT) 50 MG tablet Take 50 mg by mouth daily. 10/17/21  Yes [provider]  traZODone (DESYREL) 100 MG tablet Take 100 mg by mouth at bedtime. 07/24/16  Yes [provider]  valsartan (DIOVAN) 320 MG tablet Take 320 mg by mouth daily. 01/01/23  Yes [provider]  VENTOLIN HFA 108 (90 Base) MCG/ACT inhaler Inhale 1 puff into the lungs every 4 (four) hours as needed. 11/05/22  Yes [provider]  amLODipine (NORVASC) 10 MG tablet Take 10 mg by mouth daily. 09/11/21   [provider]  cyclobenzaprine  (FLEXERIL) 10 MG tablet Take 10 mg by mouth at bedtime as needed. 04/19/22   [provider]  feeding supplement (ENSURE ENLIVE / ENSURE PLUS) LIQD Take 237 mLs by mouth 2 (two) times daily between meals. 08/22/21   Calvert Cantor, MD  fluconazole (DIFLUCAN) 100 MG tablet Take 100 mg by mouth once. Patient not taking: Reported on 02/11/2023 11/20/22   [provider]  hydrochlorothiazide (HYDRODIURIL) 25 MG tablet Take 25 mg by mouth daily. Patient not taking: Reported on 02/12/2023 11/20/22   [provider]  HYDROcodone-acetaminophen (NORCO/VICODIN) 5-325 MG tablet Take 1 tablet by mouth every 6 (six) hours as needed. Patient not taking: Reported on 02/12/2023 11/05/22   [provider]  Insulin Pen Needle (BD PEN NEEDLE MICRO U/F) 32G X 6 MM MISC 1 each by Other route See admin instructions. Use one pen needle to inject insulin 4 times daily. **must have appointment for additional refills.** 06/30/18   Romero Belling, MD  Insulin Pen Needle (BD PEN NEEDLE MICRO U/F) 32G X 6 MM MISC USE 1 PEN NEEDLE SIX TIMES DAILY Patient taking differently: 1 each by Other route See admin instructions. USE 1 PEN NEEDLE SIX TIMES DAILY 07/02/18   Romero Belling, MD  ondansetron (ZOFRAN) 4 MG tablet TAKE 1 TABLET BY MOUTH EVERY 4 TO 6 HOURS AS NEEDED FOR NAUSEA 12/26/21   Unk Lightning, PA  pregabalin (LYRICA) 100 MG capsule Take 200-300 mg by mouth See admin instructions. Take 2 capsule (200 mg) by mouth every morning and take three capsules (300 mg) at bedtime as needed for nerve pain Patient not taking: Reported on 02/12/2023 10/15/19   [provider]      Allergies    Contrast media [iodinated contrast media]    Review of Systems   Review of Systems  Physical Exam Updated Vital Signs BP (!) 219/92   Pulse (!) 58   Temp 98.6 F (37 C)   Resp 20   SpO2 99%  Physical Exam  ED Results / Procedures / Treatments   Labs (all labs ordered are listed, but only  abnormal results are displayed) Labs Reviewed  CBC WITH DIFFERENTIAL/PLATELET - Abnormal; Notable for the following components:      Result Value   WBC 3.5 (*)    RBC 4.13 (*)    Hemoglobin 12.0 (*)    HCT 35.5 (*)    All other components within normal limits  COMPREHENSIVE METABOLIC PANEL - Abnormal; Notable for the following components:   Potassium 2.9 (*)    Glucose, Bld 175 (*)    Creatinine, Ser 1.47 (*)    Calcium 7.4 (*)    Total Protein 5.5 (*)    Albumin 2.6 (*)    GFR, Estimated 52 (*)    All other components within normal limits  CBG MONITORING, ED - Abnormal; Notable for the following components:   Glucose-Capillary 151 (*)  All other components within normal limits  CBG MONITORING, ED - Abnormal; Notable for the following components:   Glucose-Capillary 119 (*)    All other components within normal limits  CBG MONITORING, ED - Abnormal; Notable for the following components:   Glucose-Capillary 131 (*)    All other components within normal limits  CBG MONITORING, ED - Abnormal; Notable for the following components:   Glucose-Capillary 160 (*)    All other components within normal limits  CBG MONITORING, ED - Abnormal; Notable for the following components:   Glucose-Capillary 137 (*)    All other components within normal limits  CBG MONITORING, ED    EKG None  Radiology No results found.  Procedures Procedures    Medications Ordered in ED Medications  hydrALAZINE (APRESOLINE) tablet 50 mg (has no administration in time range)  potassium chloride SA (KLOR-CON M) CR tablet 40 mEq (40 mEq Oral Given 02/12/23 0105)    ED Course/ Medical Decision Making/ A&P                                 Medical Decision Making Amount and/or Complexity of Data Reviewed Labs: ordered.  Risk Prescription drug management.   Patient with history of type 1 diabetes here for evaluation of hyperglycemia in setting of taking extra short acting insulin.  EMS reported a  blood sugar of 44, treated with D10.  Patient has not had any recurrent hypoglycemia in the emergency department and is tolerating p.o.  BMP with stable renal function.  He does have hypokalemia, replaced orally.  He was observed for several hours without recurrent hypoglycemia.  Evidence of acute infectious process.  Plan to discharge home with outpatient follow-up and return precautions.        Final Clinical Impression(s) / ED Diagnoses Final diagnoses:  Hypoglycemia    Rx / DC Orders ED Discharge Orders     None         Tilden Fossa, MD 02/12/23 Josefine Class    Tilden Fossa, MD 02/12/23 0502

## 2023-02-12 NOTE — ED Notes (Signed)
Called patients wife and she stated that it would be between 9-10 that she would be able to get here to pick patient up.  Informed her where the patient is at this time but may change based on patients coming in and the room available.

## 2023-02-13 ENCOUNTER — Other Ambulatory Visit: Payer: Self-pay | Admitting: Gastroenterology

## 2023-02-13 ENCOUNTER — Other Ambulatory Visit: Payer: Self-pay | Admitting: Physician Assistant

## 2023-03-01 ENCOUNTER — Ambulatory Visit: Payer: 59 | Attending: Cardiovascular Disease | Admitting: Cardiovascular Disease

## 2023-03-13 ENCOUNTER — Telehealth: Payer: Self-pay | Admitting: Gastroenterology

## 2023-03-13 ENCOUNTER — Encounter: Payer: 59 | Admitting: Gastroenterology

## 2023-03-13 NOTE — Telephone Encounter (Signed)
Good Afternoon Dr. Meridee Score,   I called this patient today at 2:00 pm to see if he was coming for his procedure.  He stated he thought it was yesterday and thought he had called and canceled procedure. However there not any notes.  He stated he would call back to reschedule  I will NO SHOW this Patient.  [employer]

## 2023-03-13 NOTE — Telephone Encounter (Signed)
No show is appropriate. Can reschedule 1 more time before he needs to be reseen in clinic. Please assess late cancellation fee, as even if procedure was thought to be on Tuesday, it is within the late-cancellation window. GM

## 2023-03-19 NOTE — Telephone Encounter (Signed)
Inbound call from patient requesting to reschedule procedures. Advised patient next available for both procedures will be in March. Will call patient when calendar opens for March.

## 2023-03-21 ENCOUNTER — Other Ambulatory Visit: Payer: Self-pay

## 2023-03-21 ENCOUNTER — Encounter (HOSPITAL_COMMUNITY): Payer: Self-pay | Admitting: Family Medicine

## 2023-03-21 ENCOUNTER — Emergency Department (HOSPITAL_COMMUNITY): Payer: 59

## 2023-03-21 ENCOUNTER — Inpatient Hospital Stay (HOSPITAL_COMMUNITY)
Admission: EM | Admit: 2023-03-21 | Discharge: 2023-03-23 | DRG: 683 | Disposition: A | Discharge status: Home / Self Care | Payer: 59 | Attending: Internal Medicine | Admitting: Internal Medicine

## 2023-03-21 DIAGNOSIS — Z91041 Radiographic dye allergy status: Secondary | ICD-10-CM

## 2023-03-21 DIAGNOSIS — F39 Unspecified mood [affective] disorder: Secondary | ICD-10-CM | POA: Diagnosis present

## 2023-03-21 DIAGNOSIS — I2510 Atherosclerotic heart disease of native coronary artery without angina pectoris: Secondary | ICD-10-CM | POA: Diagnosis present

## 2023-03-21 DIAGNOSIS — Z85820 Personal history of malignant melanoma of skin: Secondary | ICD-10-CM | POA: Diagnosis not present

## 2023-03-21 DIAGNOSIS — E876 Hypokalemia: Secondary | ICD-10-CM | POA: Diagnosis present

## 2023-03-21 DIAGNOSIS — E1142 Type 2 diabetes mellitus with diabetic polyneuropathy: Secondary | ICD-10-CM | POA: Diagnosis present

## 2023-03-21 DIAGNOSIS — R112 Nausea with vomiting, unspecified: Secondary | ICD-10-CM

## 2023-03-21 DIAGNOSIS — I10 Essential (primary) hypertension: Secondary | ICD-10-CM | POA: Diagnosis present

## 2023-03-21 DIAGNOSIS — K2270 Barrett's esophagus without dysplasia: Secondary | ICD-10-CM | POA: Diagnosis present

## 2023-03-21 DIAGNOSIS — K529 Noninfective gastroenteritis and colitis, unspecified: Secondary | ICD-10-CM | POA: Diagnosis present

## 2023-03-21 DIAGNOSIS — E43 Unspecified severe protein-calorie malnutrition: Secondary | ICD-10-CM | POA: Diagnosis present

## 2023-03-21 DIAGNOSIS — E785 Hyperlipidemia, unspecified: Secondary | ICD-10-CM | POA: Diagnosis present

## 2023-03-21 DIAGNOSIS — Z8042 Family history of malignant neoplasm of prostate: Secondary | ICD-10-CM

## 2023-03-21 DIAGNOSIS — E119 Type 2 diabetes mellitus without complications: Secondary | ICD-10-CM

## 2023-03-21 DIAGNOSIS — I130 Hypertensive heart and chronic kidney disease with heart failure and stage 1 through stage 4 chronic kidney disease, or unspecified chronic kidney disease: Secondary | ICD-10-CM | POA: Diagnosis present

## 2023-03-21 DIAGNOSIS — Z833 Family history of diabetes mellitus: Secondary | ICD-10-CM | POA: Diagnosis not present

## 2023-03-21 DIAGNOSIS — E1143 Type 2 diabetes mellitus with diabetic autonomic (poly)neuropathy: Secondary | ICD-10-CM | POA: Diagnosis present

## 2023-03-21 DIAGNOSIS — I5032 Chronic diastolic (congestive) heart failure: Secondary | ICD-10-CM | POA: Diagnosis present

## 2023-03-21 DIAGNOSIS — Z6822 Body mass index (BMI) 22.0-22.9, adult: Secondary | ICD-10-CM | POA: Diagnosis not present

## 2023-03-21 DIAGNOSIS — Z87891 Personal history of nicotine dependence: Secondary | ICD-10-CM

## 2023-03-21 DIAGNOSIS — E872 Acidosis, unspecified: Secondary | ICD-10-CM | POA: Diagnosis present

## 2023-03-21 DIAGNOSIS — Z8673 Personal history of transient ischemic attack (TIA), and cerebral infarction without residual deficits: Secondary | ICD-10-CM

## 2023-03-21 DIAGNOSIS — N183 Chronic kidney disease, stage 3 unspecified: Secondary | ICD-10-CM

## 2023-03-21 DIAGNOSIS — N179 Acute kidney failure, unspecified: Principal | ICD-10-CM | POA: Diagnosis present

## 2023-03-21 DIAGNOSIS — E1122 Type 2 diabetes mellitus with diabetic chronic kidney disease: Secondary | ICD-10-CM | POA: Diagnosis present

## 2023-03-21 DIAGNOSIS — Z803 Family history of malignant neoplasm of breast: Secondary | ICD-10-CM

## 2023-03-21 DIAGNOSIS — Z794 Long term (current) use of insulin: Principal | ICD-10-CM

## 2023-03-21 DIAGNOSIS — Z8711 Personal history of peptic ulcer disease: Secondary | ICD-10-CM

## 2023-03-21 DIAGNOSIS — N189 Chronic kidney disease, unspecified: Secondary | ICD-10-CM | POA: Diagnosis present

## 2023-03-21 DIAGNOSIS — Z808 Family history of malignant neoplasm of other organs or systems: Secondary | ICD-10-CM

## 2023-03-21 DIAGNOSIS — K3184 Gastroparesis: Secondary | ICD-10-CM | POA: Diagnosis present

## 2023-03-21 DIAGNOSIS — E11649 Type 2 diabetes mellitus with hypoglycemia without coma: Secondary | ICD-10-CM | POA: Diagnosis present

## 2023-03-21 DIAGNOSIS — Z800 Family history of malignant neoplasm of digestive organs: Secondary | ICD-10-CM

## 2023-03-21 LAB — GLUCOSE, CAPILLARY
Glucose-Capillary: 201 mg/dL — ABNORMAL HIGH (ref 70–99)
Glucose-Capillary: 211 mg/dL — ABNORMAL HIGH (ref 70–99)
Glucose-Capillary: 159 mg/dL — ABNORMAL HIGH (ref 70–99)
Glucose-Capillary: 139 mg/dL — ABNORMAL HIGH (ref 70–99)
Glucose-Capillary: 256 mg/dL — ABNORMAL HIGH (ref 70–99)

## 2023-03-21 LAB — CBC WITH DIFFERENTIAL/PLATELET
Abs Immature Granulocytes: 0.01 10*3/uL (ref 0.00–0.07)
Abs Immature Granulocytes: 0.02 10*3/uL (ref 0.00–0.07)
Basophils Absolute: 0 10*3/uL (ref 0.0–0.1)
Basophils Absolute: 0 10*3/uL (ref 0.0–0.1)
Basophils Relative: 1 %
Basophils Relative: 0 %
Eosinophils Absolute: 0 10*3/uL (ref 0.0–0.5)
Eosinophils Absolute: 0 10*3/uL (ref 0.0–0.5)
Eosinophils Relative: 0 %
Eosinophils Relative: 0 %
HCT: 34.5 % — ABNORMAL LOW (ref 39.0–52.0)
HCT: 33.8 % — ABNORMAL LOW (ref 39.0–52.0)
Hemoglobin: 11.9 g/dL — ABNORMAL LOW (ref 13.0–17.0)
Hemoglobin: 11.6 g/dL — ABNORMAL LOW (ref 13.0–17.0)
Immature Granulocytes: 0 %
Immature Granulocytes: 0 %
Lymphocytes Relative: 17 %
Lymphocytes Relative: 17 %
Lymphs Abs: 1 10*3/uL (ref 0.7–4.0)
Lymphs Abs: 0.9 10*3/uL (ref 0.7–4.0)
MCH: 28.8 pg (ref 26.0–34.0)
MCH: 28.7 pg (ref 26.0–34.0)
MCHC: 34.5 g/dL (ref 30.0–36.0)
MCHC: 34.3 g/dL (ref 30.0–36.0)
MCV: 83.5 fL (ref 80.0–100.0)
MCV: 83.7 fL (ref 80.0–100.0)
Monocytes Absolute: 0.3 10*3/uL (ref 0.1–1.0)
Monocytes Absolute: 0.2 10*3/uL (ref 0.1–1.0)
Monocytes Relative: 5 %
Monocytes Relative: 4 %
Neutro Abs: 4.3 10*3/uL (ref 1.7–7.7)
Neutro Abs: 4.1 10*3/uL (ref 1.7–7.7)
Neutrophils Relative %: 77 %
Neutrophils Relative %: 79 %
Platelets: 158 10*3/uL (ref 150–400)
Platelets: 140 10*3/uL — ABNORMAL LOW (ref 150–400)
RBC: 4.13 MIL/uL — ABNORMAL LOW (ref 4.22–5.81)
RBC: 4.04 MIL/uL — ABNORMAL LOW (ref 4.22–5.81)
RDW: 14.8 % (ref 11.5–15.5)
RDW: 15.1 % (ref 11.5–15.5)
WBC: 5.6 10*3/uL (ref 4.0–10.5)
WBC: 5.3 10*3/uL (ref 4.0–10.5)
nRBC: 0 % (ref 0.0–0.2)
nRBC: 0 % (ref 0.0–0.2)

## 2023-03-21 LAB — COMPREHENSIVE METABOLIC PANEL
ALT: 15 U/L (ref 0–44)
AST: 21 U/L (ref 15–41)
Albumin: 3.3 g/dL — ABNORMAL LOW (ref 3.5–5.0)
Alkaline Phosphatase: 61 U/L (ref 38–126)
Anion gap: 11 (ref 5–15)
BUN: 42 mg/dL — ABNORMAL HIGH (ref 8–23)
CO2: 20 mmol/L — ABNORMAL LOW (ref 22–32)
Calcium: 6 mg/dL — CL (ref 8.9–10.3)
Chloride: 105 mmol/L (ref 98–111)
Creatinine, Ser: 3.5 mg/dL — ABNORMAL HIGH (ref 0.61–1.24)
GFR, Estimated: 18 mL/min — ABNORMAL LOW (ref >60)
Glucose, Bld: 169 mg/dL — ABNORMAL HIGH (ref 70–99)
Potassium: 2.9 mmol/L — ABNORMAL LOW (ref 3.5–5.1)
Sodium: 136 mmol/L (ref 135–145)
Total Bilirubin: 0.9 mg/dL (ref <1.2)
Total Protein: 6.9 g/dL (ref 6.5–8.1)

## 2023-03-21 LAB — URINALYSIS, ROUTINE W REFLEX MICROSCOPIC
Bilirubin Urine: NEGATIVE
Glucose, UA: >=500 mg/dL — AB
Hgb urine dipstick: NEGATIVE
Ketones, ur: 5 mg/dL — AB
Leukocytes,Ua: NEGATIVE
Nitrite: NEGATIVE
Protein, ur: >=300 mg/dL — AB
Specific Gravity, Urine: 1.015 (ref 1.005–1.030)
pH: 6 (ref 5.0–8.0)

## 2023-03-21 LAB — LACTIC ACID, PLASMA: Lactic Acid, Venous: 1.5 mmol/L (ref 0.5–1.9)

## 2023-03-21 LAB — HEMOGLOBIN A1C
Hgb A1c MFr Bld: 5.8 % — ABNORMAL HIGH (ref 4.8–5.6)
Mean Plasma Glucose: 119.76 mg/dL

## 2023-03-21 LAB — TROPONIN I (HIGH SENSITIVITY)
Troponin I (High Sensitivity): 21 ng/L — ABNORMAL HIGH (ref <18)
Troponin I (High Sensitivity): 18 ng/L — ABNORMAL HIGH (ref <18)

## 2023-03-21 LAB — BETA-HYDROXYBUTYRIC ACID: Beta-Hydroxybutyric Acid: 1.61 mmol/L — ABNORMAL HIGH (ref 0.05–0.27)

## 2023-03-21 LAB — MAGNESIUM: Magnesium: 2.3 mg/dL (ref 1.7–2.4)

## 2023-03-21 LAB — VALPROIC ACID LEVEL: Valproic Acid Lvl: <10 ug/mL — ABNORMAL LOW (ref 50.0–100.0)

## 2023-03-21 LAB — LIPASE, BLOOD: Lipase: 28 U/L (ref 11–51)

## 2023-03-21 LAB — BRAIN NATRIURETIC PEPTIDE: B Natriuretic Peptide: 316.3 pg/mL — ABNORMAL HIGH (ref 0.0–100.0)

## 2023-03-21 LAB — PHOSPHORUS: Phosphorus: 3.8 mg/dL (ref 2.5–4.6)

## 2023-03-21 MED ORDER — ACETAMINOPHEN 650 MG RE SUPP: 650.0000 mg | Freq: Four times a day (QID) | RECTAL | Status: DC | PRN

## 2023-03-21 MED ORDER — TRAZODONE HCL 100 MG PO TABS
100.0000 mg | ORAL_TABLET | Freq: Every day | ORAL | Status: DC
  Administered 2023-03-21 – 2023-03-22 (×2): 100 mg via ORAL
  Filled 2023-03-21 (×2): qty 1

## 2023-03-21 MED ORDER — INSULIN ASPART 100 UNIT/ML IJ SOLN
0.0000 [IU] | Freq: Three times a day (TID) | INTRAMUSCULAR | Status: DC
  Filled 2023-03-21: qty 0.15

## 2023-03-21 MED ORDER — SERTRALINE HCL 50 MG PO TABS
50.0000 mg | ORAL_TABLET | Freq: Two times a day (BID) | ORAL | Status: DC
Start: 2023-03-21 — End: 2023-03-23
  Administered 2023-03-21 – 2023-03-23 (×5): 50 mg via ORAL
  Filled 2023-03-21 (×5): qty 2

## 2023-03-21 MED ORDER — PRAZOSIN HCL 1 MG PO CAPS
1.0000 mg | ORAL_CAPSULE | Freq: Every day | ORAL | Status: DC
  Administered 2023-03-21 – 2023-03-22 (×2): 1 mg via ORAL
  Filled 2023-03-21 (×2): qty 1

## 2023-03-21 MED ORDER — CALCIUM GLUCONATE-NACL 1-0.675 GM/50ML-% IV SOLN
1.0000 g | Freq: Once | INTRAVENOUS | Status: AC
  Administered 2023-03-21: 1000 mg via INTRAVENOUS
  Filled 2023-03-21: qty 50

## 2023-03-21 MED ORDER — POTASSIUM CHLORIDE 10 MEQ/100ML IV SOLN
10.0000 meq | INTRAVENOUS | Status: AC
  Administered 2023-03-21 (×2): 10 meq via INTRAVENOUS
  Filled 2023-03-21: qty 100

## 2023-03-21 MED ORDER — ACETAMINOPHEN 325 MG PO TABS: 650.0000 mg | ORAL_TABLET | Freq: Four times a day (QID) | ORAL | Status: DC | PRN

## 2023-03-21 MED ORDER — PANTOPRAZOLE SODIUM 40 MG IV SOLR
40.0000 mg | Freq: Two times a day (BID) | INTRAVENOUS | Status: DC
  Administered 2023-03-21 – 2023-03-23 (×5): 40 mg via INTRAVENOUS
  Filled 2023-03-21 (×5): qty 10

## 2023-03-21 MED ORDER — SODIUM CHLORIDE 0.9 % IV BOLUS
1000.0000 mL | Freq: Once | INTRAVENOUS | Status: AC
  Administered 2023-03-21: 1000 mL via INTRAVENOUS

## 2023-03-21 MED ORDER — CALCIUM CARBONATE 1250 (500 CA) MG PO TABS
1.0000 | ORAL_TABLET | Freq: Two times a day (BID) | ORAL | Status: DC
  Administered 2023-03-21 – 2023-03-23 (×4): 1250 mg via ORAL
  Filled 2023-03-21 (×4): qty 1

## 2023-03-21 MED ORDER — METOCLOPRAMIDE HCL 5 MG/ML IJ SOLN
5.0000 mg | Freq: Once | INTRAMUSCULAR | Status: DC
  Filled 2023-03-21: qty 2

## 2023-03-21 MED ORDER — SODIUM CHLORIDE 0.9 % IV SOLN
INTRAVENOUS | Status: DC
Start: 2023-03-21 — End: 2023-03-21

## 2023-03-21 MED ORDER — INSULIN ASPART 100 UNIT/ML IJ SOLN
0.0000 [IU] | Freq: Every day | INTRAMUSCULAR | Status: DC
  Filled 2023-03-21: qty 0.05

## 2023-03-21 MED ORDER — HYDRALAZINE HCL 20 MG/ML IJ SOLN: 10.0000 mg | Freq: Four times a day (QID) | INTRAMUSCULAR | Status: DC | PRN

## 2023-03-21 MED ORDER — HYDRALAZINE HCL 50 MG PO TABS
50.0000 mg | ORAL_TABLET | Freq: Three times a day (TID) | ORAL | Status: DC
  Administered 2023-03-21 – 2023-03-23 (×7): 50 mg via ORAL
  Filled 2023-03-21 (×8): qty 1

## 2023-03-21 MED ORDER — NEBIVOLOL HCL 10 MG PO TABS
10.0000 mg | ORAL_TABLET | Freq: Every day | ORAL | Status: DC
  Administered 2023-03-21 – 2023-03-23 (×3): 10 mg via ORAL
  Filled 2023-03-21 (×4): qty 1

## 2023-03-21 MED ORDER — ONDANSETRON HCL 4 MG/2ML IJ SOLN
4.0000 mg | Freq: Four times a day (QID) | INTRAMUSCULAR | Status: DC | PRN
  Administered 2023-03-21: 4 mg via INTRAVENOUS
  Filled 2023-03-21: qty 2

## 2023-03-21 MED ORDER — HEPARIN SODIUM (PORCINE) 5000 UNIT/ML IJ SOLN
5000.0000 [IU] | Freq: Three times a day (TID) | INTRAMUSCULAR | Status: DC
Start: 2023-03-21 — End: 2023-03-23
  Administered 2023-03-21 – 2023-03-23 (×7): 5000 [IU] via SUBCUTANEOUS
  Filled 2023-03-21 (×7): qty 1

## 2023-03-21 MED ORDER — INSULIN ASPART 100 UNIT/ML IJ SOLN
0.0000 [IU] | INTRAMUSCULAR | Status: DC
  Administered 2023-03-21: 8 [IU] via SUBCUTANEOUS
  Administered 2023-03-21: 3 [IU] via SUBCUTANEOUS
  Administered 2023-03-21: 5 [IU] via SUBCUTANEOUS
  Administered 2023-03-21: 2 [IU] via SUBCUTANEOUS
  Administered 2023-03-22 (×2): 8 [IU] via SUBCUTANEOUS
  Administered 2023-03-22: 5 [IU] via SUBCUTANEOUS
  Administered 2023-03-22 – 2023-03-23 (×4): 3 [IU] via SUBCUTANEOUS
  Filled 2023-03-21: qty 0.15

## 2023-03-21 MED ORDER — POTASSIUM CHLORIDE 10 MEQ/100ML IV SOLN
10.0000 meq | INTRAVENOUS | Status: AC
  Administered 2023-03-21 (×3): 10 meq via INTRAVENOUS
  Filled 2023-03-21 (×5): qty 100

## 2023-03-21 MED ORDER — SODIUM CHLORIDE 0.9 % IV SOLN
12.5000 mg | Freq: Four times a day (QID) | INTRAVENOUS | Status: DC | PRN
  Administered 2023-03-21: 12.5 mg via INTRAVENOUS
  Filled 2023-03-21: qty 12.5

## 2023-03-21 MED ORDER — LACTATED RINGERS IV SOLN: INTRAVENOUS | Status: AC

## 2023-03-21 MED ORDER — ROSUVASTATIN CALCIUM 20 MG PO TABS
20.0000 mg | ORAL_TABLET | Freq: Every day | ORAL | Status: DC
Start: 2023-03-21 — End: 2023-03-23
  Administered 2023-03-21 – 2023-03-23 (×3): 20 mg via ORAL
  Filled 2023-03-21 (×3): qty 1

## 2023-03-21 MED ORDER — POTASSIUM CHLORIDE CRYS ER 20 MEQ PO TBCR: 40.0000 meq | EXTENDED_RELEASE_TABLET | Freq: Once | ORAL | Status: DC

## 2023-03-21 NOTE — H&P (Addendum)
PCP:   Dois Davenport, MD   Chief Complaint:  Nausea, vomiting, diarrhea  HPI: This is a 67 year old male with PMHx of CVA (04/13/2021 echo with LVEF 60-65%), diagnostic heart failure, malignant melanoma, T2DM, HTN, HLD, prior PUD, Barrett's esophagus, GERD.  Per patient today his fingerstick blood sugar was up to 300.  He states he took too much coverage insulin.  His G6 alarmed, his glucose has dropped to 44.  Per patient he ate a lot of sugar, developed nausea, vomiting and diarrhea.  Patient appears to have chronic issues with diarrhea.  He came to the ER.  Patient is irritable historian, making him a relatively poor historian.  In the ER vitals normal.  Potassium 2.9, calcium 6.0, creatinine 3.5 baseline 1.47.  Troponin 21, 18 baseline.  UA negative.  CXR normal.   Review of Systems:  Per HPI  Past Medical History: Past Medical History:  Diagnosis Date   Arthritis    Diabetes mellitus type 2, uncontrolled    Diabetic neuropathy (HCC)    GERD (gastroesophageal reflux disease)    History of stomach ulcers    Hypertension    Melanoma (HCC)    mets to lymph node and intestines   Past Surgical History:  Procedure Laterality Date   ABCESS DRAINAGE     gluteal area   COLONOSCOPY     INGUINAL HERNIA REPAIR N/A 08/15/2015   Procedure: HERNIA REPAIR INGUINAL INCARCERATED;  Surgeon: Ovidio Kin, MD;  Location: [facility] ORS;  Service: General;  Laterality: N/A;  with MESH   pyloric stenosis     TONSILLECTOMY      Medications: Prior to Admission medications   Medication Sig Start Date End Date Taking? Authorizing Provider  acetaminophen (TYLENOL) 500 MG tablet Take 1,000 mg by mouth every 6 (six) hours as needed for mild pain.   Yes [provider]  cyclobenzaprine (FLEXERIL) 10 MG tablet Take 10 mg by mouth at bedtime as needed. 04/19/22  Yes [provider]  divalproex (DEPAKOTE ER) 250 MG 24 hr tablet Take 250 mg by mouth daily. 03/13/23  Yes [provider]   famotidine (PEPCID) 40 MG tablet TAKE 1 TABLET BY MOUTH AT BEDTIME 02/13/23  Yes Lemmon, Violet Baldy, PA  FARXIGA 5 MG TABS tablet Take 5 mg by mouth daily. 05/07/22  Yes [provider]  feeding supplement (ENSURE ENLIVE / ENSURE PLUS) LIQD Take 237 mLs by mouth 2 (two) times daily between meals. 08/22/21  Yes Calvert Cantor, MD  furosemide (LASIX) 80 MG tablet Take 80 mg by mouth daily. 11/11/21  Yes [provider]  hydrALAZINE (APRESOLINE) 50 MG tablet Take 1 tablet (50 mg total) by mouth 3 (three) times daily. 02/04/23  Yes Croitoru, Mihai, MD  hydrochlorothiazide (HYDRODIURIL) 25 MG tablet Take 25 mg by mouth daily. 11/20/22  Yes [provider]  insulin degludec (TRESIBA FLEXTOUCH) 100 UNIT/ML FlexTouch Pen Inject 20 Units into the skin daily before breakfast.   Yes [provider]  insulin lispro (HUMALOG) 100 UNIT/ML KwikPen Inject 5 Units into the skin 3 (three) times daily. Per sliding scale   Yes [provider]  Insulin Pen Needle (BD PEN NEEDLE MICRO U/F) 32G X 6 MM MISC 1 each by Other route See admin instructions. Use one pen needle to inject insulin 4 times daily. **must have appointment for additional refills.** 06/30/18  Yes Romero Belling, MD  Insulin Pen Needle (BD PEN NEEDLE MICRO U/F) 32G X 6 MM MISC USE 1 PEN NEEDLE SIX  TIMES DAILY Patient taking differently: 1 each by Other route See admin instructions. USE 1 PEN NEEDLE SIX TIMES DAILY 07/02/18  Yes Romero Belling, MD  Iron-Folic Acid-Vit B12 (IRON FORMULA PO) Take by mouth daily.   Yes [provider]  lisinopril (ZESTRIL) 40 MG tablet Take 40 mg by mouth daily. 02/18/23  Yes [provider]  loperamide (IMODIUM) 2 MG capsule Take 1 capsule by mouth once daily 02/13/23  Yes Lemmon, Violet Baldy, PA  MELATONIN PO Take 30 mg by mouth at bedtime.   Yes [provider]  montelukast (SINGULAIR) 10 MG tablet Take 10 mg by mouth at bedtime. 10/13/19  Yes [provider]  Multiple Vitamin (MULTIVITAMIN WITH MINERALS) TABS tablet Take 1 tablet by mouth daily.   Yes [provider]  Na Sulfate-K Sulfate-Mg Sulf 17.5-3.13-1.6 GM/177ML SOLN Take by mouth once. 01/09/23  Yes [provider]  nebivolol (BYSTOLIC) 10 MG tablet Take 1 tablet (10 mg total) by mouth daily. 08/29/22  Yes Juanda Crumble K, PA-C  ondansetron (ZOFRAN) 4 MG tablet TAKE 1 TABLET BY MOUTH EVERY 4 TO 6 HOURS AS NEEDED FOR NAUSEA 02/13/23  Yes Unk Lightning, PA  ondansetron (ZOFRAN-ODT) 4 MG disintegrating tablet Take 1-2 tablets (4-8 mg total) by mouth every 6 (six) hours as needed for nausea. 01/09/23  Yes Unk Lightning, PA  pantoprazole (PROTONIX) 40 MG tablet Take 1 tablet by mouth twice daily 11/26/22  Yes Lemmon, Violet Baldy, PA  potassium chloride SA (KLOR-CON M) 20 MEQ tablet 20 mEq daily Patient taking differently: Take 20 mEq by mouth once a week. 20 mEq daily 04/15/21  Yes Wendling, Jilda Roche, DO  prazosin (MINIPRESS) 1 MG capsule Take 1 mg by mouth at bedtime. 05/17/22  Yes [provider]  rosuvastatin (CRESTOR) 20 MG tablet Take 1 tablet by mouth once daily 11/23/22  Yes Wittenborn, Deborah, NP  sertraline (ZOLOFT) 50 MG tablet Take 50 mg by mouth 2 (two) times daily. 10/17/21  Yes [provider]  traZODone (DESYREL) 100 MG tablet Take 100 mg by mouth at bedtime. 07/24/16  Yes [provider]  valsartan (DIOVAN) 320 MG tablet Take 320 mg by mouth daily. 01/01/23  Yes [provider]  VENTOLIN HFA 108 (90 Base) MCG/ACT inhaler Inhale 1 puff into the lungs every 4 (four) hours as needed. 11/05/22  Yes [provider]  amLODipine (NORVASC) 10 MG tablet Take 10 mg by mouth daily. 09/11/21   [provider]  fluconazole (DIFLUCAN) 100 MG tablet Take 100 mg by mouth once. Patient not taking: Reported on 02/11/2023 11/20/22   [provider]  HYDROcodone-acetaminophen (NORCO/VICODIN)  5-325 MG tablet Take 1 tablet by mouth every 6 (six) hours as needed. Patient not taking: Reported on 02/12/2023 11/05/22   [provider]  pregabalin (LYRICA) 100 MG capsule Take 200-300 mg by mouth See admin instructions. Take 2 capsule (200 mg) by mouth every morning and take three capsules (300 mg) at bedtime as needed for nerve pain Patient not taking: Reported on 02/12/2023 10/15/19   [provider]  sertraline (ZOLOFT) 100 MG tablet Take 200 mg by mouth daily. Patient not taking: Reported on 03/21/2023 02/14/23   [provider]    Allergies:   Allergies  Allergen Reactions   Contrast Media [Iodinated Contrast Media] Itching    Social History:  reports that he quit smoking about 26 years ago. His smoking use included cigarettes. He started smoking about 34 years ago. He has  a 8 pack-year smoking history. He has never used smokeless tobacco. He reports current alcohol use of about 4.0 standard drinks of alcohol per week. He reports that he does not currently use drugs after having used the following drugs: Marijuana. Frequency: 7.00 times per week.  Family History: Family History  Problem Relation Age of Onset   Breast cancer Mother    Bone cancer Mother    Melanoma Mother    Prostate cancer Father    Pancreatic cancer Maternal Grandfather    Diabetes Paternal Aunt    Colon cancer Neg Hx    Colon polyps Neg Hx    Esophageal cancer Neg Hx    Stomach cancer Neg Hx     Physical Exam: Vitals:   03/21/23 0025 03/21/23 0300 03/21/23 0400  BP:  127/60 (!) 146/74  Pulse:  62 63  Resp:   18  Temp:  98.1 F (36.7 C)   SpO2: 98% 96% 96%    General: A and O x 3, well developed and nourished, no acute distress Eyes: Pink conjunctiva, no scleral icterus ENT: Moist oral mucosa, neck supple, no thyromegaly Lungs: CTA B/L, no wheeze, no crackles, no use of accessory muscles Cardiovascular: RRR, no regurgitation, no gallops, no murmurs. No carotid bruits, no  JVD Abdomen: soft, positive BS, NTND, not an acute abdomen GU: not examined Neuro: CN II - XII grossly intact, sensation intact Musculoskeletal: strength 5/5 all extremities, no edema Skin: no rash, no subcutaneous crepitation, no decubitus Psych: Irritable patient   Labs on Admission:  Recent Labs    03/21/23 0155 03/21/23 0359  NA 136  --   K 2.9*  --   CL 105  --   CO2 20*  --   GLUCOSE 169*  --   BUN 42*  --   CREATININE 3.50*  --   CALCIUM 6.0*  --   MG  --  2.3  PHOS  --  3.8   Recent Labs    03/21/23 0155  AST 21  ALT 15  ALKPHOS 61  BILITOT 0.9  PROT 6.9  ALBUMIN 3.3*   Recent Labs    03/21/23 0155  LIPASE 28   Recent Labs    03/21/23 0047 03/21/23 0155  WBC 5.6 5.3  NEUTROABS 4.3 4.1  HGB 11.9* 11.6*  HCT 34.5* 33.8*  MCV 83.5 83.7  PLT 158 140*     Radiological Exams on Admission: DG Chest Port 1 View Result Date: 03/21/2023 CLINICAL DATA:  Shortness of breath EXAM: PORTABLE CHEST 1 VIEW COMPARISON:  08/19/2021 FINDINGS: Cardiac shadow is mildly prominent. Lungs are clear bilaterally. No bony abnormality is noted. IMPRESSION: No active disease. Electronically Signed   By: Alcide Clever M.D.   On: 03/21/2023 02:10    Assessment/Plan Present on Admission:  Acute-on-chronic kidney injury (HCC)  Metabolic acidosis -LR at 100 cc an hour. -Strict I's and O's -Home medications lisinopril, Farxiga, HCTZ on hold. -Metabolic acidosis likely due to CKD.  Lactic acidosis.  Treatment IV fluids.  Follow lactic acid creatinine levels   Hypokalemia //  Hypocalcemia -Replacing p.o. and IV. -BMP in a.m. -P.o. potassium resumed   Diarrhea //  Nausea and vomiting //  Lactic acidosis -Above likely due to GI etiology.  Patient denies issues with diarrhea, Imodium on patient's med list.  Patient follows with GI.  Patient with the bowel incontinence during interview.  This increased patient's irritability. -GI panel ordered.  C. difficile not ordered.   -  Hydrate.  IV  fluid.  For lactic acid levels until normalized   Transient hypoglycemia in a patient with T2DM //  peripheral neuropathy -Carbohydrate diet, sliding scale insulin initiated -Cervical on hold   CAD (coronary artery disease) //  Chronic diastolic CHF (congestive heart failure) (HCC) //  Essential hypertension -Lisinopril, Farxiga, HCTZ on hold. -Crestor  Mood disorder -Zoloft, trazodone resumed   Melanoma -In remission  Lee Kuang 03/21/2023, 5:09 AM

## 2023-03-21 NOTE — Progress Notes (Signed)
PROGRESS NOTE    Jesse Lane  VWU:[id number] DOB: October 29, 1955 DOA: 03/21/2023 PCP: Dois Davenport, MD    Brief Narrative:  Patient is a 67 year old male with past medical history of CVA heart failure with preserved ejection fraction,  malignant melanoma, T2DM, HTN, HLD, Barrett's esophagus, GERD presented to hospital with hyperglycemia in 2 2 months ago was insulin after which his hemoglobin dropped to 44.  Patient then presented to the ED.  In the ED, patient was noted to have potassium of 2.9, calcium of 6.0 with creatinine 3.5 from baseline 1.4.  Urinalysis was negative for infection but had protein and glucosuria.  Lactate was 1.5.  Beta hydroxybutyrate level was elevated at 1.6 with valproic acid level less than 10.  Did not have anion gap.  Chest x-ray was normal..  CMP with blood glucose level was 169.  Patient was then admitted hospital for further evaluation and treatment.  Assessment and plan.   Acute kidney injury on CKD with mild metabolic acidosis. Baseline creatinine around 1.2.  Presented with creatinine of 3.5.  Continue to hold lisinopril Farxiga and HCTZ from home.  Continue intake and output charting Daily weights.  Continue IV fluids with Ringer lactate at 75 mL/h.  Hypokalemia  Potassium of 2.9.  Continue to hold farxogoag,  HCTZ.  Continue IV potassium supplements.  Add p.o. potassium if able to tolerate.  Check BMP in AM.  Hypocalcemia Will replenish through IV calcium gluconate and add oral calcium as well.  Check CMP in AM.  PTH and calcium levels in progress.   Diarrhea with nausea and vomiting  Received IV fluids.  Check GI panel, C. difficile.  History of PUD, GERD and gastroparesis. Colonoscopy scheduled for Apr 16, 2023 and follows up with Dr Cornelia Copa.  States that he had vomiting this morning.  Will add Phenergan if unable to control with Zofran.  Add IV Protonix.   Transient hypoglycemia in a patient with T2DM  with  peripheral neuropathy Continue  sliding scale insulin.  Check hemoglobin A1c.   CAD (coronary artery disease) '  Chronic diastolic CHF (congestive heart failure) Stable at this time.  Hold lisinopril Farxiga and HCTZ..  Continue Crestor.   Essential hypertension -Lisinopril, Farxiga, HCTZ on hold.   Mood disorder -Zoloft, trazodone   Melanoma -In remission  Diabetic neuropathy Has some difficult with ambulation at home.. Will ambulate by am.      DVT prophylaxis: heparin injection 5,000 Units Start: 03/21/23 0600   Code Status:     Code Status: Full Code  Disposition: Home in 1-2 days Status is: Inpatient  Remains inpatient appropriate because: pending clinical improvement.   Family Communication: None at bedside.  Consultants:  None   Procedures:  None   Antimicrobials:  None   Anti-infectives (From admission, onward)    None       Subjective: Today, patient was seen and examined at bedside. Has had vomiting, several loose stools, unable to tolerate breakfast. No abdominal pain.   Objective: Vitals:   03/21/23 0300 03/21/23 0400 03/21/23 0514 03/21/23 0701  BP: 127/60 (!) 146/74  (!) 188/89  Pulse: 62 63  65  Resp:  18    Temp: 98.1 F (36.7 C)  97.8 F (36.6 C) (!) 97.4 F (36.3 C)  TempSrc:   Oral Oral  SpO2: 96% 96%  100%    Intake/Output Summary (Last 24 hours) at 03/21/2023 1017 Last data filed at 03/21/2023 0930 Gross per 24 hour  Intake 73.85 ml  Output 150 ml  Net -76.15 ml   There were no vitals filed for this visit.  Physical Examination: There is no height or weight on file to calculate BMI.   General:  Average built, not in obvious distress HENT:   No scleral pallor or icterus noted. Oral mucosa is moist.  Chest:   Diminished breath sounds bilaterally. No crackles or wheezes.  CVS: S1 &S2 heard. No murmur.  Regular rate and rhythm. Abdomen: Soft, nontender, nondistended.  Bowel sounds are heard.   Extremities: No cyanosis, clubbing or edema.  Peripheral  pulses are palpable. Psych: Alert, awake and oriented, mildly anxious. CNS:  No cranial nerve deficits.  Power equal in all extremities.   Skin: Warm and dry.  No rashes noted.  Data Reviewed:   CBC: Recent Labs  Lab 03/21/23 0047 03/21/23 0155  WBC 5.6 5.3  NEUTROABS 4.3 4.1  HGB 11.9* 11.6*  HCT 34.5* 33.8*  MCV 83.5 83.7  PLT 158 140*    Basic Metabolic Panel: Recent Labs  Lab 03/21/23 0155 03/21/23 0359  NA 136  --   K 2.9*  --   CL 105  --   CO2 20*  --   GLUCOSE 169*  --   BUN 42*  --   CREATININE 3.50*  --   CALCIUM 6.0*  --   MG  --  2.3  PHOS  --  3.8    Liver Function Tests: Recent Labs  Lab 03/21/23 0155  AST 21  ALT 15  ALKPHOS 61  BILITOT 0.9  PROT 6.9  ALBUMIN 3.3*     Radiology Studies: DG Chest Port 1 View Result Date: 03/21/2023 CLINICAL DATA:  Shortness of breath EXAM: PORTABLE CHEST 1 VIEW COMPARISON:  08/19/2021 FINDINGS: Cardiac shadow is mildly prominent. Lungs are clear bilaterally. No bony abnormality is noted. IMPRESSION: No active disease. Electronically Signed   By: Alcide Clever M.D.   On: 03/21/2023 02:10      LOS: 0 days     Joycelyn Das, MD [facility] Available via Epic secure chat 7am-7pm After these hours, please refer to coverage provider listed on [email or web address] 03/21/2023, 10:17 AM

## 2023-03-21 NOTE — ED Notes (Signed)
Critical Calcium 6.0 EDP made aware see orders to follow.

## 2023-03-21 NOTE — ED Triage Notes (Signed)
Pt arrived via EMS from Home, eating less, cbg 60... ate, cbg is now 120 3 episode of diarrhea, lightheaded, small amount of emesis. 4mg  zofran, 20g in the L Ac.142/97, 59 HR, 150 cbg, 98% 2L.

## 2023-03-21 NOTE — ED Provider Notes (Signed)
[place] EMERGENCY DEPARTMENT AT [facility] Provider Note   CSN: [id number] Arrival date & time: 03/21/23  0013     History {Add pertinent medical, surgical, social history, OB history to HPI:1} Chief Complaint  Patient presents with   Shortness of Breath    Jesse Lane is a 67 y.o. male.  Patient was in his normal state of health tonight when he suddenly felt his sugar dropping.  He reports that it "happened fast".  He ate some sugar but then started to feel like he had to have a bowel movement.  He did not even make it to the toilet before he had a diarrhea stool and then vomited.  Patient feeling weak.  He comes to the ER by ambulance.  He was feeling short of breath when all this was occurring, was placed on oxygen by EMS for comfort.  He does not normally use oxygen.  Patient reports that he is feeling much better now.       Home Medications Prior to Admission medications   Medication Sig Start Date End Date Taking? Authorizing Provider  acetaminophen (TYLENOL) 500 MG tablet Take 1,000 mg by mouth every 6 (six) hours as needed for mild pain.   Yes [provider]  cyclobenzaprine (FLEXERIL) 10 MG tablet Take 10 mg by mouth at bedtime as needed. 04/19/22  Yes [provider]  divalproex (DEPAKOTE ER) 250 MG 24 hr tablet Take 250 mg by mouth daily. 03/13/23  Yes [provider]  famotidine (PEPCID) 40 MG tablet TAKE 1 TABLET BY MOUTH AT BEDTIME 02/13/23  Yes Lemmon, Violet Baldy, PA  FARXIGA 5 MG TABS tablet Take 5 mg by mouth daily. 05/07/22  Yes [provider]  feeding supplement (ENSURE ENLIVE / ENSURE PLUS) LIQD Take 237 mLs by mouth 2 (two) times daily between meals. 08/22/21  Yes Calvert Cantor, MD  furosemide (LASIX) 80 MG tablet Take 80 mg by mouth daily. 11/11/21  Yes [provider]  hydrALAZINE (APRESOLINE) 50 MG tablet Take 1 tablet (50 mg total) by mouth 3 (three) times daily. 02/04/23  Yes Croitoru, Mihai,  MD  hydrochlorothiazide (HYDRODIURIL) 25 MG tablet Take 25 mg by mouth daily. 11/20/22  Yes [provider]  insulin degludec (TRESIBA FLEXTOUCH) 100 UNIT/ML FlexTouch Pen Inject 20 Units into the skin daily before breakfast.   Yes [provider]  insulin lispro (HUMALOG) 100 UNIT/ML KwikPen Inject 5 Units into the skin 3 (three) times daily. Per sliding scale   Yes [provider]  Insulin Pen Needle (BD PEN NEEDLE MICRO U/F) 32G X 6 MM MISC 1 each by Other route See admin instructions. Use one pen needle to inject insulin 4 times daily. **must have appointment for additional refills.** 06/30/18  Yes Romero Belling, MD  Insulin Pen Needle (BD PEN NEEDLE MICRO U/F) 32G X 6 MM MISC USE 1 PEN NEEDLE SIX TIMES DAILY Patient taking differently: 1 each by Other route See admin instructions. USE 1 PEN NEEDLE SIX TIMES DAILY 07/02/18  Yes Romero Belling, MD  Iron-Folic Acid-Vit B12 (IRON FORMULA PO) Take by mouth daily.   Yes [provider]  lisinopril (ZESTRIL) 40 MG tablet Take 40 mg by mouth daily. 02/18/23  Yes [provider]  loperamide (IMODIUM) 2 MG capsule Take 1 capsule by mouth once daily 02/13/23  Yes Lemmon, Violet Baldy, PA  MELATONIN PO Take 30 mg by mouth at bedtime.   Yes [provider]  montelukast (SINGULAIR) 10 MG tablet  Take 10 mg by mouth at bedtime. 10/13/19  Yes [provider]  Multiple Vitamin (MULTIVITAMIN WITH MINERALS) TABS tablet Take 1 tablet by mouth daily.   Yes [provider]  Na Sulfate-K Sulfate-Mg Sulf 17.5-3.13-1.6 GM/177ML SOLN Take by mouth once. 01/09/23  Yes [provider]  nebivolol (BYSTOLIC) 10 MG tablet Take 1 tablet (10 mg total) by mouth daily. 08/29/22  Yes Juanda Crumble K, PA-C  ondansetron (ZOFRAN) 4 MG tablet TAKE 1 TABLET BY MOUTH EVERY 4 TO 6 HOURS AS NEEDED FOR NAUSEA 02/13/23  Yes Unk Lightning, PA  ondansetron (ZOFRAN-ODT) 4 MG disintegrating tablet Take 1-2  tablets (4-8 mg total) by mouth every 6 (six) hours as needed for nausea. 01/09/23  Yes Unk Lightning, PA  pantoprazole (PROTONIX) 40 MG tablet Take 1 tablet by mouth twice daily 11/26/22  Yes Lemmon, Violet Baldy, PA  potassium chloride SA (KLOR-CON M) 20 MEQ tablet 20 mEq daily Patient taking differently: Take 20 mEq by mouth once a week. 20 mEq daily 04/15/21  Yes Wendling, Jilda Roche, DO  prazosin (MINIPRESS) 1 MG capsule Take 1 mg by mouth at bedtime. 05/17/22  Yes [provider]  rosuvastatin (CRESTOR) 20 MG tablet Take 1 tablet by mouth once daily 11/23/22  Yes Wittenborn, Deborah, NP  sertraline (ZOLOFT) 50 MG tablet Take 50 mg by mouth 2 (two) times daily. 10/17/21  Yes [provider]  traZODone (DESYREL) 100 MG tablet Take 100 mg by mouth at bedtime. 07/24/16  Yes [provider]  valsartan (DIOVAN) 320 MG tablet Take 320 mg by mouth daily. 01/01/23  Yes [provider]  VENTOLIN HFA 108 (90 Base) MCG/ACT inhaler Inhale 1 puff into the lungs every 4 (four) hours as needed. 11/05/22  Yes [provider]  amLODipine (NORVASC) 10 MG tablet Take 10 mg by mouth daily. 09/11/21   [provider]  fluconazole (DIFLUCAN) 100 MG tablet Take 100 mg by mouth once. Patient not taking: Reported on 02/11/2023 11/20/22   [provider]  HYDROcodone-acetaminophen (NORCO/VICODIN) 5-325 MG tablet Take 1 tablet by mouth every 6 (six) hours as needed. Patient not taking: Reported on 02/12/2023 11/05/22   [provider]  pregabalin (LYRICA) 100 MG capsule Take 200-300 mg by mouth See admin instructions. Take 2 capsule (200 mg) by mouth every morning and take three capsules (300 mg) at bedtime as needed for nerve pain Patient not taking: Reported on 02/12/2023 10/15/19   [provider]  sertraline (ZOLOFT) 100 MG tablet Take 200 mg by mouth daily. Patient not taking: Reported on 03/21/2023 02/14/23   [provider]       Allergies    Contrast media [iodinated contrast media]    Review of Systems   Review of Systems  Physical Exam Updated Vital Signs BP (!) 146/74   Pulse 63   Temp 98.1 F (36.7 C)   Resp 18   SpO2 96%  Physical Exam Vitals and nursing note reviewed.  Constitutional:      General: He is not in acute distress.    Appearance: He is well-developed.  HENT:     Head: Normocephalic and atraumatic.     Mouth/Throat:     Mouth: Mucous membranes are moist.  Eyes:     General: Vision grossly intact. Gaze aligned appropriately.     Extraocular Movements: Extraocular movements intact.     Conjunctiva/sclera: Conjunctivae normal.  Cardiovascular:     Rate and Rhythm: Normal rate and regular rhythm.  Pulses: Normal pulses.     Heart sounds: Normal heart sounds, S1 normal and S2 normal. No murmur heard.    No friction rub. No gallop.  Pulmonary:     Effort: Pulmonary effort is normal. No respiratory distress.     Breath sounds: Normal breath sounds.  Abdominal:     Palpations: Abdomen is soft.     Tenderness: There is no abdominal tenderness. There is no guarding or rebound.     Hernia: No hernia is present.  Musculoskeletal:        General: No swelling.     Cervical back: Full passive range of motion without pain, normal range of motion and neck supple. No pain with movement, spinous process tenderness or muscular tenderness. Normal range of motion.     Right lower leg: No edema.     Left lower leg: No edema.  Skin:    General: Skin is warm and dry.     Capillary Refill: Capillary refill takes less than 2 seconds.     Findings: No ecchymosis, erythema, lesion or wound.  Neurological:     Mental Status: He is alert and oriented to person, place, and time.     GCS: GCS eye subscore is 4. GCS verbal subscore is 5. GCS motor subscore is 6.     Cranial Nerves: Cranial nerves 2-12 are intact.     Sensory: Sensation is intact.     Motor: Motor function is intact. No weakness or  abnormal muscle tone.     Coordination: Coordination is intact.  Psychiatric:        Mood and Affect: Mood normal.        Speech: Speech normal.        Behavior: Behavior normal.     ED Results / Procedures / Treatments   Labs (all labs ordered are listed, but only abnormal results are displayed) Labs Reviewed  CBC WITH DIFFERENTIAL/PLATELET - Abnormal; Notable for the following components:      Result Value   RBC 4.13 (*)    Hemoglobin 11.9 (*)    HCT 34.5 (*)    All other components within normal limits  BRAIN NATRIURETIC PEPTIDE - Abnormal; Notable for the following components:   B Natriuretic Peptide 316.3 (*)    All other components within normal limits  CBC WITH DIFFERENTIAL/PLATELET - Abnormal; Notable for the following components:   RBC 4.04 (*)    Hemoglobin 11.6 (*)    HCT 33.8 (*)    Platelets 140 (*)    All other components within normal limits  COMPREHENSIVE METABOLIC PANEL - Abnormal; Notable for the following components:   Potassium 2.9 (*)    CO2 20 (*)    Glucose, Bld 169 (*)    BUN 42 (*)    Creatinine, Ser 3.50 (*)    Calcium 6.0 (*)    Albumin 3.3 (*)    GFR, Estimated 18 (*)    All other components within normal limits  TROPONIN I (HIGH SENSITIVITY) - Abnormal; Notable for the following components:   Troponin I (High Sensitivity) 21 (*)    All other components within normal limits  TROPONIN I (HIGH SENSITIVITY) - Abnormal; Notable for the following components:   Troponin I (High Sensitivity) 18 (*)    All other components within normal limits  LIPASE, BLOOD  URINALYSIS, ROUTINE W REFLEX MICROSCOPIC  MAGNESIUM  PHOSPHORUS  PTH, INTACT AND CALCIUM    EKG None  Radiology DG Chest Port 1 View Result Date:  03/21/2023 CLINICAL DATA:  Shortness of breath EXAM: PORTABLE CHEST 1 VIEW COMPARISON:  08/19/2021 FINDINGS: Cardiac shadow is mildly prominent. Lungs are clear bilaterally. No bony abnormality is noted. IMPRESSION: No active disease.  Electronically Signed   By: Alcide Clever M.D.   On: 03/21/2023 02:10    Procedures Procedures  {Document cardiac monitor, telemetry assessment procedure when appropriate:1}  Medications Ordered in ED Medications  calcium gluconate 1 g/ 50 mL sodium chloride IVPB (1,000 mg Intravenous New Bag/Given 03/21/23 0358)  sodium chloride 0.9 % bolus 1,000 mL (1,000 mLs Intravenous Bolus 03/21/23 0357)    ED Course/ Medical Decision Making/ A&P   {   Click here for ABCD2, HEART and other calculatorsREFRESH Note before signing :1}                              Medical Decision Making Amount and/or Complexity of Data Reviewed Labs: ordered. Radiology: ordered.  Risk Prescription drug management.   Presents to the emergency department after having his blood sugar dropped.  Patient with persistent weakness.  He reports that he was feeling well during the course of today up until this event.  Patient reports improvement at arrival.  He was initially short of breath but no longer short of breath.  Chest x-ray with no active disease.  EKG without obvious ischemia.  Troponin slightly above 18 but flat, doubt NSTEMI.  No active chest pain.  Chemistry showed significant acute kidney injury as well as hypocalcemia.  Looking through his records, calcium tends to be low but generally in the range of 8.  He was 6.0 today.  Additional blood tests ordered and given calcium.  Patient given fluid bolus.  {Document critical care time when appropriate:1} {Document review of labs and clinical decision tools ie heart score, Chads2Vasc2 etc:1}  {Document your independent review of radiology images, and any outside records:1} {Document your discussion with family members, caretakers, and with consultants:1} {Document social determinants of health affecting pt's care:1} {Document your decision making why or why not admission, treatments were needed:1} Final Clinical Impression(s) / ED Diagnoses Final diagnoses:   None    Rx / DC Orders ED Discharge Orders     None

## 2023-03-21 NOTE — ED Notes (Signed)
.ED TO INPATIENT HANDOFF REPORT  ED Nurse Name and Phone #: Orvis Brill Name/Age/Gender Jesse Lane 67 y.o. male Room/Bed: WA13/WA13  Code Status   Code Status: Full Code  Home/SNF/Other Home Patient oriented to: self, place, time, and situation Is this baseline? Yes   Triage Complete: Triage complete  Chief Complaint Acute-on-chronic kidney injury (HCC) [N17.9, N18.9]  Triage Note Pt arrived via EMS from Home, eating less, cbg 60... ate, cbg is now 120 3 episode of diarrhea, lightheaded, small amount of emesis. 4mg  zofran, 20g in the L Ac.142/97, 59 HR, 150 cbg, 98% 2L.   Allergies Allergies  Allergen Reactions   Contrast Media [Iodinated Contrast Media] Itching    Level of Care/Admitting Diagnosis ED Disposition     ED Disposition  Admit   Condition  --   Comment  Hospital Area: [facility] [[id number]]  Level of Care: Telemetry [5]  Admit to tele based on following criteria: Monitor QTC interval  May admit patient to [facility] or [facility] if equivalent level of care is available:: No  Covid Evaluation: Confirmed COVID Negative  Diagnosis: Acute-on-chronic kidney injury [facility]) [[id number]]  Admitting Physician: Alvester Chou  Attending Physician: Gery Pray [[id number]]  Certification:: I certify this patient will need inpatient services for at least 2 midnights  Expected Medical Readiness: 03/23/2023          B Medical/Surgery History Past Medical History:  Diagnosis Date   Arthritis    Diabetes mellitus type 2, uncontrolled    Diabetic neuropathy (HCC)    GERD (gastroesophageal reflux disease)    History of stomach ulcers    Hypertension    Melanoma (HCC)    mets to lymph node and intestines   Past Surgical History:  Procedure Laterality Date   ABCESS DRAINAGE     gluteal area   COLONOSCOPY     INGUINAL HERNIA REPAIR N/A 08/15/2015   Procedure: HERNIA REPAIR INGUINAL INCARCERATED;  Surgeon: Ovidio Kin, MD;   Location: [facility] ORS;  Service: General;  Laterality: N/A;  with MESH   pyloric stenosis     TONSILLECTOMY       A IV Location/Drains/Wounds Patient Lines/Drains/Airways Status     Active Line/Drains/Airways     Name Placement date Placement time Site Days   Peripheral IV 03/21/23 20 G 1" Anterior;Distal;Left;Upper Arm 03/21/23  --  Arm  less than 1            Intake/Output Last 24 hours No intake or output data in the 24 hours ending 03/21/23 0514  Labs/Imaging Results for orders placed or performed during the hospital encounter of 03/21/23 (from the past 48 hours)  CBC with Differential/Platelet     Status: Abnormal   Collection Time: 03/21/23 12:47 AM  Result Value Ref Range   WBC 5.6 4.0 - 10.5 K/uL   RBC 4.13 (L) 4.22 - 5.81 MIL/uL   Hemoglobin 11.9 (L) 13.0 - 17.0 g/dL   HCT 04.5 (L) 40.9 - 81.1 %   MCV 83.5 80.0 - 100.0 fL   MCH 28.8 26.0 - 34.0 pg   MCHC 34.5 30.0 - 36.0 g/dL   RDW 91.4 78.2 - 95.6 %   Platelets 158 150 - 400 K/uL   nRBC 0.0 0.0 - 0.2 %   Neutrophils Relative % 77 %   Neutro Abs 4.3 1.7 - 7.7 K/uL   Lymphocytes Relative 17 %   Lymphs Abs 1.0 0.7 - 4.0 K/uL   Monocytes Relative 5 %  Monocytes Absolute 0.3 0.1 - 1.0 K/uL   Eosinophils Relative 0 %   Eosinophils Absolute 0.0 0.0 - 0.5 K/uL   Basophils Relative 1 %   Basophils Absolute 0.0 0.0 - 0.1 K/uL   Immature Granulocytes 0 %   Abs Immature Granulocytes 0.01 0.00 - 0.07 K/uL    Comment: Performed at [facility], [address] [place], [place] [address]  Brain natriuretic peptide     Status: Abnormal   Collection Time: 03/21/23  1:55 AM  Result Value Ref Range   B Natriuretic Peptide 316.3 (H) 0.0 - 100.0 pg/mL    Comment: Performed at [facility], [address], [place], [place] [address]  CBC with Differential/Platelet     Status: Abnormal   Collection Time: 03/21/23  1:55 AM  Result Value Ref Range   WBC 5.3 4.0 - 10.5 K/uL   RBC 4.04 (L)  4.22 - 5.81 MIL/uL   Hemoglobin 11.6 (L) 13.0 - 17.0 g/dL   HCT 09.8 (L) 11.9 - 14.7 %   MCV 83.7 80.0 - 100.0 fL   MCH 28.7 26.0 - 34.0 pg   MCHC 34.3 30.0 - 36.0 g/dL   RDW 82.9 56.2 - 13.0 %   Platelets 140 (L) 150 - 400 K/uL   nRBC 0.0 0.0 - 0.2 %   Neutrophils Relative % 79 %   Neutro Abs 4.1 1.7 - 7.7 K/uL   Lymphocytes Relative 17 %   Lymphs Abs 0.9 0.7 - 4.0 K/uL   Monocytes Relative 4 %   Monocytes Absolute 0.2 0.1 - 1.0 K/uL   Eosinophils Relative 0 %   Eosinophils Absolute 0.0 0.0 - 0.5 K/uL   Basophils Relative 0 %   Basophils Absolute 0.0 0.0 - 0.1 K/uL   Immature Granulocytes 0 %   Abs Immature Granulocytes 0.02 0.00 - 0.07 K/uL    Comment: Performed at [facility], [address], [place], [place] [address]  Comprehensive metabolic panel     Status: Abnormal   Collection Time: 03/21/23  1:55 AM  Result Value Ref Range   Sodium 136 135 - 145 mmol/L   Potassium 2.9 (L) 3.5 - 5.1 mmol/L   Chloride 105 98 - 111 mmol/L   CO2 20 (L) 22 - 32 mmol/L   Glucose, Bld 169 (H) 70 - 99 mg/dL    Comment: Glucose reference range applies only to samples taken after fasting for at least 8 hours.   BUN 42 (H) 8 - 23 mg/dL   Creatinine, Ser 4.69 (H) 0.61 - 1.24 mg/dL   Calcium 6.0 (LL) 8.9 - 10.3 mg/dL    Comment: CRITICAL RESULT CALLED TO, READ BACK BY AND VERIFIED WITH Lakeesha Fontanilla, C RN @ 0234 ON 03/21/2023 BY MTA    Total Protein 6.9 6.5 - 8.1 g/dL   Albumin 3.3 (L) 3.5 - 5.0 g/dL   AST 21 15 - 41 U/L   ALT 15 0 - 44 U/L   Alkaline Phosphatase 61 38 - 126 U/L   Total Bilirubin 0.9 <1.2 mg/dL   GFR, Estimated 18 (L) >60 mL/min    Comment: (NOTE) Calculated using the CKD-EPI Creatinine Equation (2021)    Anion gap 11 5 - 15    Comment: Performed at [facility], [address] [place], [place] [address]  Lipase, blood     Status: None   Collection Time: 03/21/23  1:55 AM  Result Value Ref Range   Lipase 28 11 -  51 U/L    Comment:  Performed at [facility], [address], [place], [place] [address]  Troponin I (High Sensitivity)     Status: Abnormal   Collection Time: 03/21/23  1:55 AM  Result Value Ref Range   Troponin I (High Sensitivity) 21 (H) <18 ng/L    Comment: (NOTE) Elevated high sensitivity troponin I (hsTnI) values and significant  changes across serial measurements may suggest ACS but many other  chronic and acute conditions are known to elevate hsTnI results.  Refer to the "Links" section for chest pain algorithms and additional  guidance. Performed at [facility], [address] [place], [place] [address]   Troponin I (High Sensitivity)     Status: Abnormal   Collection Time: 03/21/23  2:50 AM  Result Value Ref Range   Troponin I (High Sensitivity) 18 (H) <18 ng/L    Comment: (NOTE) Elevated high sensitivity troponin I (hsTnI) values and significant  changes across serial measurements may suggest ACS but many other  chronic and acute conditions are known to elevate hsTnI results.  Refer to the "Links" section for chest pain algorithms and additional  guidance. Performed at [facility], [address], [place], [place] [address]   Magnesium     Status: None   Collection Time: 03/21/23  3:59 AM  Result Value Ref Range   Magnesium 2.3 1.7 - 2.4 mg/dL    Comment: Performed at [facility], [address] [place], [place] [address]  Phosphorus     Status: None   Collection Time: 03/21/23  3:59 AM  Result Value Ref Range   Phosphorus 3.8 2.5 - 4.6 mg/dL    Comment: Performed at [facility], [address], [place], [place] [address]   DG Chest Port 1 View Result Date: 03/21/2023 CLINICAL DATA:  Shortness of breath EXAM: PORTABLE CHEST 1 VIEW COMPARISON:  08/19/2021 FINDINGS: Cardiac shadow is mildly prominent. Lungs are clear bilaterally. No bony abnormality is noted. IMPRESSION: No active  disease. Electronically Signed   By: Alcide Clever M.D.   On: 03/21/2023 02:10    Pending Labs Unresulted Labs (From admission, onward)     Start     Ordered   03/22/23 0500  Comprehensive metabolic panel  Tomorrow morning,   R        03/21/23 0505   03/22/23 0500  CBC with Differential/Platelet  Tomorrow morning,   R        03/21/23 0505   03/21/23 0502  Hemoglobin A1c  Once,   URGENT       Comments: To assess prior glycemic control    03/21/23 0502   03/21/23 0328  PTH, intact and calcium  Once,   URGENT        03/21/23 0329   03/21/23 0047  Urinalysis, Routine w reflex microscopic -Urine, Clean Catch  ONCE - URGENT,   URGENT       Question:  Specimen Source  Answer:  Urine, Clean Catch   03/21/23 0046            Vitals/Pain Today's Vitals   03/21/23 0025 03/21/23 0300 03/21/23 0400  BP:  127/60 (!) 146/74  Pulse:  62 63  Resp:   18  Temp:  98.1 F (36.7 C)   SpO2: 98% 96% 96%    Isolation Precautions No active isolations  Medications Medications  potassium chloride 10 mEq in 100 mL IVPB (has no administration in time  range)  0.9 %  sodium chloride infusion (has no administration in time range)  calcium gluconate 1 g/ 50 mL sodium chloride IVPB (has no administration in time range)  heparin injection 5,000 Units (has no administration in time range)  acetaminophen (TYLENOL) tablet 650 mg (has no administration in time range)    Or  acetaminophen (TYLENOL) suppository 650 mg (has no administration in time range)  insulin aspart (novoLOG) injection 0-15 Units (has no administration in time range)  sodium chloride 0.9 % bolus 1,000 mL (1,000 mLs Intravenous Bolus 03/21/23 0357)  calcium gluconate 1 g/ 50 mL sodium chloride IVPB (1,000 mg Intravenous New Bag/Given 03/21/23 0358)    Mobility walks       R Recommendations: See Admitting Provider Note  Report given to:   Additional Notes: Aox4, Ambulatory, IV access

## 2023-03-22 DIAGNOSIS — E872 Acidosis, unspecified: Secondary | ICD-10-CM | POA: Diagnosis not present

## 2023-03-22 DIAGNOSIS — N183 Chronic kidney disease, stage 3 unspecified: Secondary | ICD-10-CM | POA: Diagnosis not present

## 2023-03-22 DIAGNOSIS — N179 Acute kidney failure, unspecified: Secondary | ICD-10-CM | POA: Diagnosis not present

## 2023-03-22 DIAGNOSIS — E876 Hypokalemia: Secondary | ICD-10-CM | POA: Diagnosis not present

## 2023-03-22 LAB — GLUCOSE, CAPILLARY
Glucose-Capillary: 109 mg/dL — ABNORMAL HIGH (ref 70–99)
Glucose-Capillary: 151 mg/dL — ABNORMAL HIGH (ref 70–99)
Glucose-Capillary: 201 mg/dL — ABNORMAL HIGH (ref 70–99)
Glucose-Capillary: 271 mg/dL — ABNORMAL HIGH (ref 70–99)
Glucose-Capillary: 300 mg/dL — ABNORMAL HIGH (ref 70–99)
Glucose-Capillary: 98 mg/dL (ref 70–99)

## 2023-03-22 LAB — CBC WITH DIFFERENTIAL/PLATELET
Abs Immature Granulocytes: 0.01 10*3/uL (ref 0.00–0.07)
Basophils Absolute: 0 10*3/uL (ref 0.0–0.1)
Basophils Relative: 1 %
Eosinophils Absolute: 0 10*3/uL (ref 0.0–0.5)
Eosinophils Relative: 1 %
HCT: 30.9 % — ABNORMAL LOW (ref 39.0–52.0)
Hemoglobin: 10.4 g/dL — ABNORMAL LOW (ref 13.0–17.0)
Immature Granulocytes: 0 %
Lymphocytes Relative: 27 %
Lymphs Abs: 1.2 10*3/uL (ref 0.7–4.0)
MCH: 29.2 pg (ref 26.0–34.0)
MCHC: 33.7 g/dL (ref 30.0–36.0)
MCV: 86.8 fL (ref 80.0–100.0)
Monocytes Absolute: 0.3 10*3/uL (ref 0.1–1.0)
Monocytes Relative: 6 %
Neutro Abs: 2.8 10*3/uL (ref 1.7–7.7)
Neutrophils Relative %: 65 %
Platelets: 133 10*3/uL — ABNORMAL LOW (ref 150–400)
RBC: 3.56 MIL/uL — ABNORMAL LOW (ref 4.22–5.81)
RDW: 15.3 % (ref 11.5–15.5)
WBC: 4.4 10*3/uL (ref 4.0–10.5)
nRBC: 0 % (ref 0.0–0.2)

## 2023-03-22 LAB — COMPREHENSIVE METABOLIC PANEL
ALT: 15 U/L (ref 0–44)
AST: 23 U/L (ref 15–41)
Albumin: 2.8 g/dL — ABNORMAL LOW (ref 3.5–5.0)
Alkaline Phosphatase: 47 U/L (ref 38–126)
Anion gap: 10 (ref 5–15)
BUN: 31 mg/dL — ABNORMAL HIGH (ref 8–23)
CO2: 17 mmol/L — ABNORMAL LOW (ref 22–32)
Calcium: 6.2 mg/dL — CL (ref 8.9–10.3)
Chloride: 109 mmol/L (ref 98–111)
Creatinine, Ser: 3.5 mg/dL — ABNORMAL HIGH (ref 0.61–1.24)
GFR, Estimated: 18 mL/min — ABNORMAL LOW (ref >60)
Glucose, Bld: 149 mg/dL — ABNORMAL HIGH (ref 70–99)
Potassium: 2.7 mmol/L — CL (ref 3.5–5.1)
Sodium: 136 mmol/L (ref 135–145)
Total Bilirubin: 1.1 mg/dL (ref <1.2)
Total Protein: 5.6 g/dL — ABNORMAL LOW (ref 6.5–8.1)

## 2023-03-22 LAB — MAGNESIUM: Magnesium: 2.3 mg/dL (ref 1.7–2.4)

## 2023-03-22 MED ORDER — CALCIUM GLUCONATE-NACL 2-0.675 GM/100ML-% IV SOLN
2.0000 g | Freq: Once | INTRAVENOUS | Status: AC
  Administered 2023-03-22: 2000 mg via INTRAVENOUS
  Filled 2023-03-22: qty 100

## 2023-03-22 MED ORDER — POTASSIUM CHLORIDE 10 MEQ/100ML IV SOLN
10.0000 meq | INTRAVENOUS | Status: AC
  Administered 2023-03-22 (×6): 10 meq via INTRAVENOUS
  Filled 2023-03-22 (×6): qty 100

## 2023-03-22 MED ORDER — LACTATED RINGERS IV SOLN: INTRAVENOUS | Status: AC

## 2023-03-22 NOTE — Progress Notes (Signed)
PROGRESS NOTE    Jesse Lane  NWG:[id number] DOB: June 09, 1955 DOA: 03/21/2023 PCP: Dois Davenport, MD    Brief Narrative:   Patient is a 67 year old male with past medical history of CVA heart failure with preserved ejection fraction,  malignant melanoma, T2DM, HTN, HLD, Barrett's esophagus, GERD presented to hospital with hyperglycemia in 2 2 months ago was insulin after which his hemoglobin dropped to 44.  Patient then presented to the ED.  In the ED, patient was noted to have potassium of 2.9, calcium of 6.0 with creatinine 3.5 from baseline 1.4.  Urinalysis was negative for infection but had protein and glucosuria.  Lactate was 1.5.  Beta hydroxybutyrate level was elevated at 1.6 with valproic acid level less than 10.  Did not have anion gap.  Chest x-ray was normal..  CMP with blood glucose level was 169.  Patient was then admitted hospital for further evaluation and treatment.  Assessment and plan.   Acute kidney injury on CKD with mild metabolic acidosis. Baseline creatinine around 1.2.  Presented with creatinine of 3.5.  Today at 3.5 again.  Continue to hold lisinopril Farxiga and HCTZ from home.  Continue intake and output charting Daily weights.  Continue IV fluids with Ringer lactate at 75 mL/h.  Significant hypokalemia  Potassium of 2.7 from 2.9 provide adequate replacement yesterday.  Will continue to aggressively replenish through IV and orally..  Continue to hold farxogoag,  HCTZ.    Hypocalcemia Numbness yesterday.  Asymptomatic today.  Will continue with calcium gluconate injection 2 g today.  Continue with oral calcium supplement.  PTH and calcium levels in progress.   Diarrhea with nausea and vomiting  Received IV fluids.  Further bowel movements show unable to check GI and C. difficile panel.  Improved.  History of PUD, GERD and gastroparesis. Colonoscopy scheduled for Apr 16, 2023 and follows up with Dr Cornelia Copa.  On Zofran and Phenergan with IV Protonix for  symptomatic treatment.    Transient hypoglycemia in a patient with T2DM  with  peripheral neuropathy Continue sliding scale insulin.  Hemoglobin A1c of 5.8 at this time.   CAD (coronary artery disease) '  Chronic diastolic CHF (congestive heart failure) Compensated so far.  Was closely while on IV fluids.  Hold lisinopril Farxiga and HCTZ..  Continue Crestor.   Essential hypertension -Lisinopril, Farxiga, HCTZ on hold.  Add IV as needed hydralazine closely monitor blood pressure levels   Mood disorder -Zoloft, trazodone   Melanoma -In remission  Diabetic neuropathy Has some difficult with ambulation at home..  Will get PT evaluation.      DVT prophylaxis: heparin injection 5,000 Units Start: 03/21/23 0600   Code Status:     Code Status: Full Code  Disposition: Home in 1-2 days, pending PT evaluation.  Status is: Inpatient  Remains inpatient appropriate because: pending clinical improvement.  Severe electrolyte imbalances   Family Communication: None at bedside.  Consultants:  None   Procedures:  None   Antimicrobials:  None   Anti-infectives (From admission, onward)    None       Subjective: Today, patient was seen and examined at bedside.  Patient denies any nausea, vomiting, fever, chills or rigor.  Has not had a bowel movement since yesterday.  Otherwise feels okay.  He did have perioral numbness yesterday but no cramping or numbness today.   Objective: Vitals:   03/21/23 1814 03/21/23 2008 03/22/23 0441 03/22/23 1010  BP: (!) 182/101 (!) 159/79 (!) 105/52 (!) 146/71  Pulse: 65 66 (!) 57 62  Resp:  20 20   Temp: (!) 97.4 F (36.3 C) 97.7 F (36.5 C) 98.3 F (36.8 C)   TempSrc: Oral Oral Oral   SpO2: 100% 98% 97%   Weight:      Height:        Intake/Output Summary (Last 24 hours) at 03/22/2023 1037 Last data filed at 03/22/2023 0500 Gross per 24 hour  Intake 1635.96 ml  Output 1575 ml  Net 60.96 ml   Filed Weights   03/21/23 1152   Weight: 72.5 kg    Physical Examination: Body mass index is 22.29 kg/m.   General:  Average built, not in obvious distress, Communicative. HENT:   No scleral pallor or icterus noted. Oral mucosa is moist.  Chest:   Diminished breath sounds bilaterally. No crackles or wheezes.  CVS: S1 &S2 heard. No murmur.  Regular rate and rhythm. Abdomen: Soft, nontender, nondistended.  Bowel sounds are heard.   Extremities: No cyanosis, clubbing or edema.  Peripheral pulses are palpable. Psych: Alert, awake and oriented, mildly anxious. CNS:  No cranial nerve deficits.  Power equal in all extremities.   Skin: Warm and dry.  No rashes noted.  Data Reviewed:   CBC: Recent Labs  Lab 03/21/23 0047 03/21/23 0155 03/22/23 0553  WBC 5.6 5.3 4.4  NEUTROABS 4.3 4.1 2.8  HGB 11.9* 11.6* 10.4*  HCT 34.5* 33.8* 30.9*  MCV 83.5 83.7 86.8  PLT 158 140* 133*    Basic Metabolic Panel: Recent Labs  Lab 03/21/23 0155 03/21/23 0359 03/22/23 0553  NA 136  --  136  K 2.9*  --  2.7*  CL 105  --  109  CO2 20*  --  17*  GLUCOSE 169*  --  149*  BUN 42*  --  31*  CREATININE 3.50*  --  3.50*  CALCIUM 6.0*  --  6.2*  MG  --  2.3 2.3  PHOS  --  3.8  --     Liver Function Tests: Recent Labs  Lab 03/21/23 0155 03/22/23 0553  AST 21 23  ALT 15 15  ALKPHOS 61 47  BILITOT 0.9 1.1  PROT 6.9 5.6*  ALBUMIN 3.3* 2.8*     Radiology Studies: DG Chest Port 1 View Result Date: 03/21/2023 CLINICAL DATA:  Shortness of breath EXAM: PORTABLE CHEST 1 VIEW COMPARISON:  08/19/2021 FINDINGS: Cardiac shadow is mildly prominent. Lungs are clear bilaterally. No bony abnormality is noted. IMPRESSION: No active disease. Electronically Signed   By: Alcide Clever M.D.   On: 03/21/2023 02:10      LOS: 1 day     Joycelyn Das, MD [facility] Available via Epic secure chat 7am-7pm After these hours, please refer to coverage provider listed on [email or web address] 03/22/2023, 10:37 AM

## 2023-03-22 NOTE — Plan of Care (Signed)
  Problem: Coping: Goal: Ability to adjust to condition or change in health will improve Outcome: Progressing   Problem: Health Behavior/Discharge Planning: Goal: Ability to identify and utilize available resources and services will improve Outcome: Progressing   Problem: Tissue Perfusion: Goal: Adequacy of tissue perfusion will improve Outcome: Progressing

## 2023-03-23 DIAGNOSIS — N179 Acute kidney failure, unspecified: Secondary | ICD-10-CM | POA: Diagnosis not present

## 2023-03-23 DIAGNOSIS — I10 Essential (primary) hypertension: Secondary | ICD-10-CM

## 2023-03-23 DIAGNOSIS — E43 Unspecified severe protein-calorie malnutrition: Secondary | ICD-10-CM

## 2023-03-23 DIAGNOSIS — K529 Noninfective gastroenteritis and colitis, unspecified: Secondary | ICD-10-CM | POA: Diagnosis not present

## 2023-03-23 DIAGNOSIS — E872 Acidosis, unspecified: Secondary | ICD-10-CM | POA: Diagnosis not present

## 2023-03-23 DIAGNOSIS — I5032 Chronic diastolic (congestive) heart failure: Secondary | ICD-10-CM | POA: Diagnosis not present

## 2023-03-23 DIAGNOSIS — I2510 Atherosclerotic heart disease of native coronary artery without angina pectoris: Secondary | ICD-10-CM

## 2023-03-23 LAB — COMPREHENSIVE METABOLIC PANEL WITH GFR
ALT: 13 U/L (ref 0–44)
AST: 18 U/L (ref 15–41)
Albumin: 2.8 g/dL — ABNORMAL LOW (ref 3.5–5.0)
Alkaline Phosphatase: 50 U/L (ref 38–126)
Anion gap: 6 (ref 5–15)
BUN: 31 mg/dL — ABNORMAL HIGH (ref 8–23)
CO2: 16 mmol/L — ABNORMAL LOW (ref 22–32)
Calcium: 6.8 mg/dL — ABNORMAL LOW (ref 8.9–10.3)
Chloride: 110 mmol/L (ref 98–111)
Creatinine, Ser: 3.55 mg/dL — ABNORMAL HIGH (ref 0.61–1.24)
GFR, Estimated: 18 mL/min — ABNORMAL LOW (ref >60)
Glucose, Bld: 190 mg/dL — ABNORMAL HIGH (ref 70–99)
Potassium: 3.2 mmol/L — ABNORMAL LOW (ref 3.5–5.1)
Sodium: 132 mmol/L — ABNORMAL LOW (ref 135–145)
Total Bilirubin: 1 mg/dL (ref <1.2)
Total Protein: 5.7 g/dL — ABNORMAL LOW (ref 6.5–8.1)

## 2023-03-23 LAB — GASTROINTESTINAL PANEL BY PCR, STOOL (REPLACES STOOL CULTURE)

## 2023-03-23 LAB — PTH, INTACT AND CALCIUM
Calcium, Total (PTH): 5.8 mg/dL — CL (ref 8.6–10.2)
PTH: 219 pg/mL — ABNORMAL HIGH (ref 15–65)

## 2023-03-23 LAB — GLUCOSE, CAPILLARY
Glucose-Capillary: 157 mg/dL — ABNORMAL HIGH (ref 70–99)
Glucose-Capillary: 181 mg/dL — ABNORMAL HIGH (ref 70–99)
Glucose-Capillary: 194 mg/dL — ABNORMAL HIGH (ref 70–99)
Glucose-Capillary: 317 mg/dL — ABNORMAL HIGH (ref 70–99)
Glucose-Capillary: 347 mg/dL — ABNORMAL HIGH (ref 70–99)

## 2023-03-23 LAB — MAGNESIUM: Magnesium: 2.2 mg/dL (ref 1.7–2.4)

## 2023-03-23 MED ORDER — CALCIUM CARBONATE 1250 (500 CA) MG PO TABS: 1.0000 | ORAL_TABLET | Freq: Two times a day (BID) | ORAL | 1 refills | Status: AC

## 2023-03-23 NOTE — Discharge Summary (Addendum)
Physician Discharge Summary  Jesse Lane UEA:[id number] DOB: May 23, 1955 DOA: 03/21/2023  PCP: Dois Davenport, MD  Admit date: 03/21/2023 Discharge date: 03/23/2023  Admitted From: Home  Discharge disposition: Home   Recommendations for Outpatient Follow-Up:   Follow up with your primary care provider in one week.  Check CBC, BMP, magnesium in the next visit.  Currently patient is on hold with lisinopril Farxiga and HCTZ due to renal failure until PCP visit with blood work. Follow-up with oncology in 1 week/scheduled by the clinic.   Discharge Diagnosis:   Principal Problem:   Acute-on-chronic kidney injury (HCC) Active Problems:   Lactic acidosis   Essential hypertension   Protein-calorie malnutrition, severe   Insulin-requiring or dependent type II diabetes mellitus (HCC)   Hypokalemia   Chronic diastolic CHF (congestive heart failure) (HCC)   CAD (coronary artery disease)   Chronic diarrhea   Discharge Condition: Improved.  Diet recommendation: Low sodium, heart healthy.    Wound care: None.  Code status: Full.   History of Present Illness:   Patient is a 67 year old male with past medical history of CVA heart failure with preserved ejection fraction,  malignant melanoma, T2DM, HTN, hyperlipidemia Barrett's esophagus, GERD presented to hospital with hyperglycemia in 2 2 months ago was insulin after which his hemoglobin dropped to 44.  Patient then presented to the ED.  In the ED, patient was noted to have potassium of 2.9, calcium of 6.0 with creatinine 3.5 from baseline 1.4.  Urinalysis was negative for infection but had protein and glucosuria.  Lactate was 1.5.  Beta hydroxybutyrate level was elevated at 1.6 with valproic acid level less than 10.  Did not have anion gap.  Chest x-ray was normal..  CMP with blood glucose level was 169.  Patient was then admitted hospital for further evaluation and treatment.    Hospital Course:   Following conditions  were addressed during hospitalization as listed below,  Acute kidney injury on CKD with mild metabolic acidosis. Baseline creatinine around 1.2.  Presented with creatinine of 3.5.  Today at 3.5 again but clinically continues to feel well..  Continue to hold lisinopril Farxiga and HCTZ from home until seen by PCP with blood work..  Received IV fluid hydration during hospitalization.  Appears to be clinically hydrated at this time.  Patient wishes to go home at this time.  Encouraged oral hydration and holding medications on discharge with outpatient closer monitoring of BMP.   Significant hypokalemia  On presentation.  Potassium today at 3.2.  Will continue potassium supplements at home.  Recommend to follow-up with PCP for BMP check.  Continue to hold farxiga,  HCTZ.     Hypocalcemia  Continue with oral calcium supplement.  PTH level was elevated with total calcium low.  Received IV calcium supplements during hospitalization.  Will need outpatient follow-up.  Asymptomatic.   Diarrhea with nausea and vomiting  Improved.  eceived IV fluids.  Patient did not have further bowel movements so was unable to check GI and C. difficile panel.  But then had few stools today.  History of PUD, GERD and gastroparesis. Colonoscopy scheduled for Apr 16, 2023 and follows up with Dr Cornelia Copa.  Recommended outpatient follow-up.   Transient hypoglycemia in a patient with T2DM  with  peripheral neuropathy  Hemoglobin A1c of 5.8 at this time.  On Farxiga as outpatient.   CAD (coronary artery disease) '  Chronic diastolic CHF (congestive heart failure) Remained compensated despite IV fluids..  Continue to hold  lisinopril Farxiga and HCTZ..  Continue Crestor.    Essential hypertension -Lisinopril, Farxiga, HCTZ on hold on discharge.  Will continue hydralazine and we will.   Mood disorder Continue Zoloft, trazodone    Melanoma -In remission   Diabetic neuropathy Able to ambulate.    Disposition.  At this  time, patient is stable for disposition home with outpatient PCP and oncology follow-up.  Addendum.  GI pathogen panel was resulted after discharge.  Showed norovirus.  Tried multiple times to reach out to [name] but unable to speak in person so left voice message at home/mobile number.  Recommendation is to increase hydration, Imodium as needed.  Medical Consultants:   None.  Procedures:    None Subjective:   Today, patient seen and examined at bedside.  Feels good no nausea vomiting ambulating well.  Had no bowel movements for few days but then some loose stool this morning.  Patient is to go home strongly.  Received IV fluids for 2 days.  Discharge Exam:   Vitals:   03/23/23 0428 03/23/23 0940  BP: 132/65 133/60  Pulse: (!) 58 68  Resp: 18   Temp: 98.1 F (36.7 C) 98.2 F (36.8 C)  SpO2: 98%    Vitals:   03/22/23 1437 03/22/23 2038 03/23/23 0428 03/23/23 0940  BP: 128/76 (!) 158/83 132/65 133/60  Pulse: 65 64 (!) 58 68  Resp: 20 18 18    Temp: 97.6 F (36.4 C) 98.2 F (36.8 C) 98.1 F (36.7 C) 98.2 F (36.8 C)  TempSrc: Oral Oral Oral Oral  SpO2: 99% 99% 98%   Weight:      Height:       Body mass index is 22.29 kg/m.   General: Alert awake, not in obvious distress HENT: pupils equally reacting to light,  No scleral pallor or icterus noted. Oral mucosa is moist.  Chest:  Diminished breath sounds bilaterally. No crackles or wheezes.  CVS: S1 &S2 heard. No murmur.  Regular rate and rhythm. Abdomen: Soft, nontender, nondistended.  Bowel sounds are heard.   Extremities: No cyanosis, clubbing or edema.  Peripheral pulses are palpable. Psych: Alert, awake and oriented, normal mood CNS:  No cranial nerve deficits.  Power equal in all extremities.   Skin: Warm and dry.  No rashes noted.  The results of significant diagnostics from this hospitalization (including imaging, microbiology, ancillary and laboratory) are listed below for reference.     Diagnostic  Studies:   DG Chest Port 1 View Result Date: 03/21/2023 CLINICAL DATA:  Shortness of breath EXAM: PORTABLE CHEST 1 VIEW COMPARISON:  08/19/2021 FINDINGS: Cardiac shadow is mildly prominent. Lungs are clear bilaterally. No bony abnormality is noted. IMPRESSION: No active disease. Electronically Signed   By: Alcide Clever M.D.   On: 03/21/2023 02:10     Labs:   Basic Metabolic Panel: Recent Labs  Lab 03/21/23 0155 03/21/23 0359 03/22/23 0553 03/23/23 0726  NA 136  --  136 132*  K 2.9*  --  2.7* 3.2*  CL 105  --  109 110  CO2 20*  --  17* 16*  GLUCOSE 169*  --  149* 190*  BUN 42*  --  31* 31*  CREATININE 3.50*  --  3.50* 3.55*  CALCIUM 6.0* 5.8* 6.2* 6.8*  MG  --  2.3 2.3 2.2  PHOS  --  3.8  --   --    GFR Estimated Creatinine Clearance: 20.4 mL/min (A) (by C-G formula based on SCr of 3.55 mg/dL (H)).  Liver Function Tests: Recent Labs  Lab 03/21/23 0155 03/22/23 0553 03/23/23 0726  AST 21 23 18   ALT 15 15 13   ALKPHOS 61 47 50  BILITOT 0.9 1.1 1.0  PROT 6.9 5.6* 5.7*  ALBUMIN 3.3* 2.8* 2.8*   Recent Labs  Lab 03/21/23 0155  LIPASE 28   No results for input(s): "AMMONIA" in the last 168 hours. Coagulation profile No results for input(s): "INR", "PROTIME" in the last 168 hours.  CBC: Recent Labs  Lab 03/21/23 0047 03/21/23 0155 03/22/23 0553  WBC 5.6 5.3 4.4  NEUTROABS 4.3 4.1 2.8  HGB 11.9* 11.6* 10.4*  HCT 34.5* 33.8* 30.9*  MCV 83.5 83.7 86.8  PLT 158 140* 133*   Cardiac Enzymes: No results for input(s): "CKTOTAL", "CKMB", "CKMBINDEX", "TROPONINI" in the last 168 hours. BNP: Invalid input(s): "POCBNP" CBG: Recent Labs  Lab 03/23/23 0006 03/23/23 0430 03/23/23 0733 03/23/23 1135 03/23/23 1202  GLUCAP 194* 157* 181* 347* 317*   D-Dimer No results for input(s): "DDIMER" in the last 72 hours. Hgb A1c Recent Labs    03/21/23 0155  HGBA1C 5.8*   Lipid Profile No results for input(s): "CHOL", "HDL", "LDLCALC", "TRIG", "CHOLHDL", "LDLDIRECT"  in the last 72 hours. Thyroid function studies No results for input(s): "TSH", "T4TOTAL", "T3FREE", "THYROIDAB" in the last 72 hours.  Invalid input(s): "FREET3" Anemia work up No results for input(s): "VITAMINB12", "FOLATE", "FERRITIN", "TIBC", "IRON", "RETICCTPCT" in the last 72 hours. Microbiology Recent Results (from the past 240 hours)  Gastrointestinal Panel by PCR , Stool     Status: Abnormal   Collection Time: 03/22/23 10:40 AM   Specimen: Stool  Result Value Ref Range Status   Campylobacter species NOT DETECTED NOT DETECTED Final   Plesimonas shigelloides NOT DETECTED NOT DETECTED Final   Salmonella species NOT DETECTED NOT DETECTED Final   Yersinia enterocolitica NOT DETECTED NOT DETECTED Final   Vibrio species NOT DETECTED NOT DETECTED Final   Vibrio cholerae NOT DETECTED NOT DETECTED Final   Enteroaggregative E coli (EAEC) NOT DETECTED NOT DETECTED Final   Enteropathogenic E coli (EPEC) NOT DETECTED NOT DETECTED Final   Enterotoxigenic E coli (ETEC) NOT DETECTED NOT DETECTED Final   Shiga like toxin producing E coli (STEC) NOT DETECTED NOT DETECTED Final   Shigella/Enteroinvasive E coli (EIEC) NOT DETECTED NOT DETECTED Final   Cryptosporidium NOT DETECTED NOT DETECTED Final   Cyclospora cayetanensis NOT DETECTED NOT DETECTED Final   Entamoeba histolytica NOT DETECTED NOT DETECTED Final   Giardia lamblia NOT DETECTED NOT DETECTED Final   Adenovirus F40/41 NOT DETECTED NOT DETECTED Final   Astrovirus NOT DETECTED NOT DETECTED Final   Norovirus GI/GII DETECTED (A) NOT DETECTED Final    Comment: RESULT CALLED TO, READ BACK BY AND VERIFIED WITH: KIMBERLY AHRENS @ 1457 ON 03/23/2023 BY [facility]    Rotavirus A NOT DETECTED NOT DETECTED Final   Sapovirus (I, II, IV, and V) NOT DETECTED NOT DETECTED Final    Comment: Performed at [facility], [address] [place], [place] [address]     Discharge Instructions:   Discharge Instructions     Call MD for:   persistant nausea and vomiting   Complete by: As directed    Call MD for:  severe uncontrolled pain   Complete by: As directed    Diet - low sodium heart healthy   Complete by: As directed    Diet Carb Modified   Complete by: As directed    Discharge instructions   Complete by: As  directed    Follow-up with your primary care provider in 1 week.  Check blood work at that time.  Seek medical attention for worsening symptoms.  Increase hydration. Follow up with oncology in few weeks. Do not take farxiga, lisinopril, valsartan until next visit and kidney function better. Continue to take calcium and potassium at home.   Increase activity slowly   Complete by: As directed       Allergies as of 03/23/2023       Reactions   Contrast Media [iodinated Contrast Media] Itching        Medication List     PAUSE taking these medications    Farxiga 5 MG Tabs tablet Wait to take this until: March 27, 2023 Generic drug: dapagliflozin propanediol Take 5 mg by mouth daily.       STOP taking these medications    hydrochlorothiazide 25 MG tablet Commonly known as: HYDRODIURIL   HYDROcodone-acetaminophen 5-325 MG tablet Commonly known as: NORCO/VICODIN   lisinopril 40 MG tablet Commonly known as: ZESTRIL   pregabalin 100 MG capsule Commonly known as: LYRICA   valsartan 320 MG tablet Commonly known as: DIOVAN       TAKE these medications    acetaminophen 500 MG tablet Commonly known as: TYLENOL Take 1,000 mg by mouth every 6 (six) hours as needed for mild pain.   calcium carbonate 1250 (500 Ca) MG tablet Commonly known as: OS-CAL - dosed in mg of elemental calcium Take 1 tablet (1,250 mg total) by mouth 2 (two) times daily with a meal.   cyclobenzaprine 10 MG tablet Commonly known as: FLEXERIL Take 10 mg by mouth at bedtime as needed.   divalproex 250 MG 24 hr tablet Commonly known as: DEPAKOTE ER Take 250 mg by mouth daily.   feeding supplement Liqd Take 237  mLs by mouth 2 (two) times daily between meals.   hydrALAZINE 50 MG tablet Commonly known as: APRESOLINE Take 1 tablet (50 mg total) by mouth 3 (three) times daily.   insulin lispro 100 UNIT/ML KwikPen Commonly known as: HUMALOG Inject 5 Units into the skin 3 (three) times daily. Per sliding scale   Insulin Pen Needle 32G X 6 MM Misc Commonly known as: BD Pen Needle Micro U/F 1 each by Other route See admin instructions. Use one pen needle to inject insulin 4 times daily. **must have appointment for additional refills.** What changed: Another medication with the same name was changed. Make sure you understand how and when to take each.   Insulin Pen Needle 32G X 6 MM Misc Commonly known as: BD Pen Needle Micro U/F USE 1 PEN NEEDLE SIX TIMES DAILY What changed:  how much to take how to take this when to take this   IRON FORMULA PO Take by mouth daily.   loperamide 2 MG capsule Commonly known as: IMODIUM Take 1 capsule by mouth once daily   MELATONIN PO Take 30 mg by mouth at bedtime.   montelukast 10 MG tablet Commonly known as: SINGULAIR Take 10 mg by mouth at bedtime.   multivitamin with minerals Tabs tablet Take 1 tablet by mouth daily.   Na Sulfate-K Sulfate-Mg Sulf 17.5-3.13-1.6 GM/177ML Soln Take by mouth once.   nebivolol 10 MG tablet Commonly known as: Bystolic Take 1 tablet (10 mg total) by mouth daily.   ondansetron 4 MG disintegrating tablet Commonly known as: ZOFRAN-ODT Take 1-2 tablets (4-8 mg total) by mouth every 6 (six) hours as needed for nausea.   ondansetron 4  MG tablet Commonly known as: ZOFRAN TAKE 1 TABLET BY MOUTH EVERY 4 TO 6 HOURS AS NEEDED FOR NAUSEA   pantoprazole 40 MG tablet Commonly known as: PROTONIX Take 1 tablet by mouth twice daily   potassium chloride SA 20 MEQ tablet Commonly known as: KLOR-CON M 20 mEq daily What changed:  how much to take how to take this when to take this   prazosin 1 MG capsule Commonly known  as: MINIPRESS Take 1 mg by mouth at bedtime.   rosuvastatin 20 MG tablet Commonly known as: CRESTOR Take 1 tablet by mouth once daily   sertraline 50 MG tablet Commonly known as: ZOLOFT Take 50 mg by mouth 2 (two) times daily. What changed: Another medication with the same name was removed. Continue taking this medication, and follow the directions you see here.   traZODone 100 MG tablet Commonly known as: DESYREL Take 100 mg by mouth at bedtime.   Evaristo Bury FlexTouch 100 UNIT/ML FlexTouch Pen Generic drug: insulin degludec Inject 20 Units into the skin daily before breakfast.   Ventolin HFA 108 (90 Base) MCG/ACT inhaler Generic drug: albuterol Inhale 1 puff into the lungs every 4 (four) hours as needed.        Follow-up Information     Dois Davenport, MD Follow up in 1 week(s).   Specialty: Family Medicine Contact information: [address] [place] [address] [phone]                  Time coordinating discharge: 39 minutes  Signed:  Fleeta Kunde  [facility] 03/23/2023, 3:07 PM

## 2023-03-23 NOTE — Progress Notes (Addendum)
1351-Pt calm sitting in bed  iv removed, no distress tip intact    safety measures in place  pt and wife educated per poc after d/c   verbalized understanding   pt given AVS pt on way to downstairs with transport   1520-  lab called  results came back from stool test,  results given to md laxman    Md stated he will discuss with pt

## 2023-03-26 ENCOUNTER — Telehealth: Payer: Self-pay | Admitting: Medical Oncology

## 2023-03-26 ENCOUNTER — Inpatient Hospital Stay: Payer: 59

## 2023-03-26 ENCOUNTER — Inpatient Hospital Stay: Payer: 59 | Admitting: Internal Medicine

## 2023-03-26 DIAGNOSIS — R778 Other specified abnormalities of plasma proteins: Secondary | ICD-10-CM

## 2023-03-26 DIAGNOSIS — D61818 Other pancytopenia: Secondary | ICD-10-CM

## 2023-03-26 NOTE — Telephone Encounter (Signed)
Pt appt tpday r/s . He was recently discharged from hospital . Schedule message sent.

## 2023-03-28 ENCOUNTER — Telehealth: Payer: Self-pay | Admitting: Internal Medicine

## 2023-03-28 LAB — CALCIUM, IONIZED: Calcium, Ionized, Serum: 3.8 mg/dL — ABNORMAL LOW (ref 4.5–5.6)

## 2023-03-28 NOTE — Telephone Encounter (Signed)
Patient is aware of scheduled appointment times/dates

## 2023-04-01 ENCOUNTER — Encounter: Payer: Self-pay | Admitting: *Deleted

## 2023-04-01 ENCOUNTER — Inpatient Hospital Stay: Payer: 59 | Attending: Physician Assistant

## 2023-04-01 ENCOUNTER — Other Ambulatory Visit: Payer: Self-pay | Admitting: Family Medicine

## 2023-04-01 DIAGNOSIS — R778 Other specified abnormalities of plasma proteins: Secondary | ICD-10-CM

## 2023-04-01 DIAGNOSIS — D649 Anemia, unspecified: Secondary | ICD-10-CM | POA: Diagnosis not present

## 2023-04-01 DIAGNOSIS — D61818 Other pancytopenia: Secondary | ICD-10-CM | POA: Insufficient documentation

## 2023-04-01 LAB — CBC WITH DIFFERENTIAL (CANCER CENTER ONLY)
Abs Immature Granulocytes: 0 10*3/uL (ref 0.00–0.07)
Basophils Absolute: 0 10*3/uL (ref 0.0–0.1)
Basophils Relative: 1 %
Eosinophils Absolute: 0 10*3/uL (ref 0.0–0.5)
Eosinophils Relative: 1 %
HCT: 33.6 % — ABNORMAL LOW (ref 39.0–52.0)
Hemoglobin: 11.6 g/dL — ABNORMAL LOW (ref 13.0–17.0)
Immature Granulocytes: 0 %
Lymphocytes Relative: 33 %
Lymphs Abs: 1.2 10*3/uL (ref 0.7–4.0)
MCH: 29.4 pg (ref 26.0–34.0)
MCHC: 34.5 g/dL (ref 30.0–36.0)
MCV: 85.1 fL (ref 80.0–100.0)
Monocytes Absolute: 0.2 10*3/uL (ref 0.1–1.0)
Monocytes Relative: 7 %
Neutro Abs: 2.1 10*3/uL (ref 1.7–7.7)
Neutrophils Relative %: 58 %
Platelet Count: 153 10*3/uL (ref 150–400)
RBC: 3.95 MIL/uL — ABNORMAL LOW (ref 4.22–5.81)
RDW: 15.8 % — ABNORMAL HIGH (ref 11.5–15.5)
WBC Count: 3.6 10*3/uL — ABNORMAL LOW (ref 4.0–10.5)
nRBC: 0 % (ref 0.0–0.2)

## 2023-04-01 LAB — CMP (CANCER CENTER ONLY)
ALT: 14 U/L (ref 0–44)
AST: 18 U/L (ref 15–41)
Albumin: 3.5 g/dL (ref 3.5–5.0)
Alkaline Phosphatase: 58 U/L (ref 38–126)
Anion gap: 7 (ref 5–15)
BUN: 28 mg/dL — ABNORMAL HIGH (ref 8–23)
CO2: 24 mmol/L (ref 22–32)
Calcium: 6.2 mg/dL — CL (ref 8.9–10.3)
Chloride: 108 mmol/L (ref 98–111)
Creatinine: 2.47 mg/dL — ABNORMAL HIGH (ref 0.61–1.24)
GFR, Estimated: 28 mL/min — ABNORMAL LOW (ref >60)
Glucose, Bld: 141 mg/dL — ABNORMAL HIGH (ref 70–99)
Potassium: 3.8 mmol/L (ref 3.5–5.1)
Sodium: 139 mmol/L (ref 135–145)
Total Bilirubin: 0.5 mg/dL (ref <1.2)
Total Protein: 6.2 g/dL — ABNORMAL LOW (ref 6.5–8.1)

## 2023-04-01 LAB — FERRITIN: Ferritin: 94 ng/mL (ref 24–336)

## 2023-04-01 LAB — IRON AND IRON BINDING CAPACITY (CC-WL,HP ONLY)
Iron: 61 ug/dL (ref 45–182)
Saturation Ratios: 24 % (ref 17.9–39.5)
TIBC: 259 ug/dL (ref 250–450)
UIBC: 198 ug/dL (ref 117–376)

## 2023-04-01 LAB — LACTATE DEHYDROGENASE: LDH: 164 U/L (ref 98–192)

## 2023-04-02 LAB — BETA 2 MICROGLOBULIN, SERUM: Beta-2 Microglobulin: 5.5 mg/L — ABNORMAL HIGH (ref 0.6–2.4)

## 2023-04-03 LAB — IGG, IGA, IGM
IgA: 179 mg/dL (ref 61–437)
IgG (Immunoglobin G), Serum: 1220 mg/dL (ref 603–1613)
IgM (Immunoglobulin M), Srm: 193 mg/dL — ABNORMAL HIGH (ref 20–172)

## 2023-04-04 ENCOUNTER — Telehealth: Payer: Self-pay | Admitting: Gastroenterology

## 2023-04-04 ENCOUNTER — Encounter: Payer: Self-pay | Admitting: Gastroenterology

## 2023-04-04 ENCOUNTER — Telehealth: Payer: Self-pay | Admitting: Physician Assistant

## 2023-04-04 LAB — KAPPA/LAMBDA LIGHT CHAINS
Kappa free light chain: 94.4 mg/L — ABNORMAL HIGH (ref 3.3–19.4)
Kappa, lambda light chain ratio: 2.54 — ABNORMAL HIGH (ref 0.26–1.65)
Lambda free light chains: 37.1 mg/L — ABNORMAL HIGH (ref 5.7–26.3)

## 2023-04-04 NOTE — Telephone Encounter (Signed)
Error

## 2023-04-04 NOTE — Telephone Encounter (Signed)
Inbound call from patient requesting to speak about breath test. Stated he has submitted paperwork for financial assistance but has not heard anything back. Please advise, thank you.

## 2023-04-08 ENCOUNTER — Other Ambulatory Visit: Payer: Self-pay | Admitting: Physician Assistant

## 2023-04-09 ENCOUNTER — Telehealth: Payer: Self-pay | Admitting: Medical Oncology

## 2023-04-09 ENCOUNTER — Inpatient Hospital Stay: Payer: 59 | Admitting: Internal Medicine

## 2023-04-09 NOTE — Telephone Encounter (Signed)
Pt forgot about appt. Scheduled message sent.

## 2023-04-15 NOTE — Progress Notes (Signed)
 Cardiology Office Note   Date:  04/17/2023  ID:  Jesse Lane, Jesse Lane 1955-10-02, MRN [id number] PCP:  Burney Darice CROME, MD [facility] Cardiologist: Jerel Balding, MD  Reason for visit: 6 month follow-up  History of Present Illness    Jesse Lane is a 68 y.o. male with a hx of chronic diastolic heart failure, diabetes (A1C 6.1% April 2024), hypertension and chronic kidney disease.  He also has a history of gastric ulcers, GERD.  He reported issues with early morning nausea and poor food intake ever since treatment for metastatic melanoma.    With coronary calcification on CT scan, he was scheduled for coronary CTA.  CTA showed a significant stenosis in the mid right coronary artery as well as widespread coronary atherosclerosis with multiple areas of mild-moderate disease and a coronary calcium  score that places him in the 82nd percentile.  With no angina and preserved EF, he is treated medically with a focus on risk factor modification.  I last saw him in May 2024.  He felt well.  Mention blood pressures often running high.  We increase his Bystolic  to 10 mg daily and I referred him to HTN clinic.  Patient had stable chronic dyspnea on moderate exertion.  He was last seen in office in August 2024.  He had called because he was fatigued and had shortness of breath.  He had no signs of heart failure.  BP 140s/70s.  Today, patient feels well.  He mentions he is much better after he was admitted to the hospital December 12 through March 23, 2023 with AKI and potassium of 2.9.  Farxiga , HCTZ and lisinopril  were discontinued.  Received IV hydration, K and calcium  supplementation.  Patient also mention he had norovirus with diarrhea & vomiting x 4 days.    Now, he feels well.  Weight stable ~162 lbs at home.  He is eating a Mediterranean diet.  Recent BP at home 130s/80s.  He denies chest pain, shortness of breath, syncope, orthopnea, PND or significant pedal edema.  No PND,  orthopnea and significant lightheadedness.      Objective / Physical Exam   EKG today: sinus brady with HR 56  Vital signs:  BP 134/86 (BP Location: Left Arm, Patient Position: Sitting, Cuff Size: Normal)   Pulse (!) 56   Ht 5' 11 (1.803 m)   Wt 173 lb 12.8 oz (78.8 kg)   SpO2 98%   BMI 24.24 kg/m     GEN: No acute distress NECK: No carotid bruits CARDIAC: RRR, no murmurs RESPIRATORY:  Clear to auscultation without rales, wheezing or rhonchi  EXTREMITIES: No edema  Assessment and Plan    CAD with no angina -CTA coronaries 04/2021: significant stenosis in the mid right coronary artery as well as widespread coronary atherosclerosis with multiple areas of mild-moderate disease  -Treated medically -Continue statin therapy.   Hypertension, controlled. -Continue hydralazine  50mg  three times daily, Bystolic  to 10 mg daily, Prazosin  1mg  daily. -Medication choices limited by CKD.  Patient recently referred to nephrology.  Chronic diastolic heart failure, euvolemic -Has PRN lasix , has not used recently.  Restrict use given labile kidney function.  Referred to nephrology. -Likely improved with weight loss with improved diet and BP control.    Hyperlipidemia with goal LDL less than 70 -With LDL 73 in March 2023, pravastatin  switched to Crestor .  LDL improved to 42.  Disposition - Follow-up in 6 months.   Signed, Delon MARLA Holts, PA-C  04/17/2023 [facility]

## 2023-04-17 ENCOUNTER — Ambulatory Visit: Payer: 59 | Attending: Physician Assistant | Admitting: Physician Assistant

## 2023-04-17 ENCOUNTER — Encounter: Payer: Self-pay | Admitting: Physician Assistant

## 2023-04-17 VITALS — BP 134/86 | HR 56 | Ht 71.0 in | Wt 173.8 lb

## 2023-04-17 DIAGNOSIS — I5032 Chronic diastolic (congestive) heart failure: Secondary | ICD-10-CM | POA: Diagnosis not present

## 2023-04-17 DIAGNOSIS — I10 Essential (primary) hypertension: Secondary | ICD-10-CM

## 2023-04-17 DIAGNOSIS — I2510 Atherosclerotic heart disease of native coronary artery without angina pectoris: Secondary | ICD-10-CM | POA: Diagnosis not present

## 2023-04-17 NOTE — Patient Instructions (Signed)
 Medication Instructions:  No Changes *If you need a refill on your cardiac medications before your next appointment, please call your pharmacy*   Lab Work: No labs If you have labs (blood work) drawn today and your tests are completely normal, you will receive your results only by: MyChart Message (if you have MyChart) OR A paper copy in the mail If you have any lab test that is abnormal or we need to change your treatment, we will call you to review the results.   Testing/Procedures: No Testing   Follow-Up: At [facility], you and your health needs are our priority.  As part of our continuing mission to provide you with exceptional heart care, we have created designated Provider Care Teams.  These Care Teams include your primary Cardiologist (physician) and Advanced Practice Providers (APPs -  Physician Assistants and Nurse Practitioners) who all work together to provide you with the care you need, when you need it.  We recommend signing up for the patient portal called "MyChart".  Sign up information is provided on this After Visit Summary.  MyChart is used to connect with patients for Virtual Visits (Telemedicine).  Patients are able to view lab/test results, encounter notes, upcoming appointments, etc.  Non-urgent messages can be sent to your provider as well.   To learn more about what you can do with MyChart, go to [email or web address].    Your next appointment:   6 month(s)  Provider:   Thurmon Fair, MD

## 2023-04-19 ENCOUNTER — Telehealth: Payer: Self-pay | Admitting: Internal Medicine

## 2023-04-19 NOTE — Telephone Encounter (Signed)
 Patient is aware of scheduled appointment times/dates for follow up appointment

## 2023-04-26 NOTE — Progress Notes (Unsigned)
[facility] OFFICE PROGRESS NOTE  Dois Davenport, MD [address] [place] [address]  DIAGNOSIS:  1) Pancytopenia *** 2) history of metastatic melanoma status post 2 years of immunotherapy with Rande Lawman which was completed in 2020 followed by Dr. Allyne Gee at [facility] hematology oncology.   CURRENT THERAPY: Observation   INTERVAL HISTORY: Jesse Lane 68 y.o. male returns to clinic today for follow-up visit.  The patient establish care in the clinic in September 2024 for pancytopenia.  The thrombocytopenia and leukopenia were mild.  Therefore we did not check ANA, rheumatoid factor, HIV, or hepatitis but would consider it if worsening downtrending cytopenias.  If no clear etiology that we could always consider bone marrow biopsy and aspirate to rule out bone marrow pathology of pancytopenia.  Of note, the patient was also hospitalized in the interval for acute on chronic kidney injury.  He also saw gastroenterology in the interval for diarrhea.  Otherwise, he is feeling ****. He reports his energy is "***". Shortness of breath ***. Denies any chest pain.  He denies any NSAID use with his chronic kidney disease.  He reports he takes Tylenol before bedtime and trazodone to help him sleep.  Denies any known history of any stomach ulcers.   He denies frequent blood donation.  Denies any pica.    Denies any recent or frequent infections except ***  Denies any other frequent or recent viral infections including hepatitis, mono, or HIV.  He reports he used to drink heavy but he currently only drinks approximately 4 alcoholic beverages per week.  Denies any fever or chills.  He only had 1 episode of night sweats about a month ago.  Denies any unexplained weight loss. Denies any herbal supplement use.  Denies any personal history of autoimmune disorders. Denies any aspirin use.  Denies blood thinner use. Denies any  immunosuppressive drugs.   Regarding the  thrombocytopenia, the patient denies any bleeding complications from surgery. The thrombocytopenia is not consistently.    He follows closely with Dr. Allyne Gee for his melanoma. He also is ensuring his routine skin checks with dermatology. His next skin check is scheduled for ***.   Check onNSAID use and other OTC medications.   He is here for evaluation and repeat blood work.     MEDICAL HISTORY: Past Medical History:  Diagnosis Date   Arthritis    Diabetes mellitus type 2, uncontrolled    Diabetic neuropathy (HCC)    GERD (gastroesophageal reflux disease)    History of stomach ulcers    Hypertension    Melanoma (HCC)    mets to lymph node and intestines    ALLERGIES:  is allergic to contrast media [iodinated contrast media].  MEDICATIONS:  Current Outpatient Medications  Medication Sig Dispense Refill   acetaminophen (TYLENOL) 500 MG tablet Take 1,000 mg by mouth every 6 (six) hours as needed for mild pain.     calcium carbonate (OS-CAL - DOSED IN MG OF ELEMENTAL CALCIUM) 1250 (500 Ca) MG tablet Take 1 tablet (1,250 mg total) by mouth 2 (two) times daily with a meal. 60 tablet 1   cyclobenzaprine (FLEXERIL) 10 MG tablet Take 10 mg by mouth at bedtime as needed.     divalproex (DEPAKOTE ER) 250 MG 24 hr tablet Take 250 mg by mouth daily.     FARXIGA 5 MG TABS tablet Take 5 mg by mouth daily. (Patient not taking: Reported on 04/17/2023)     feeding  supplement (ENSURE ENLIVE / ENSURE PLUS) LIQD Take 237 mLs by mouth 2 (two) times daily between meals. 237 mL 12   hydrALAZINE (APRESOLINE) 50 MG tablet Take 1 tablet (50 mg total) by mouth 3 (three) times daily. 270 tablet 3   insulin degludec (TRESIBA FLEXTOUCH) 100 UNIT/ML FlexTouch Pen Inject 20 Units into the skin daily before breakfast.     insulin lispro (HUMALOG) 100 UNIT/ML KwikPen Inject 5 Units into the skin 3 (three) times daily. Per sliding scale     Insulin Pen Needle (BD PEN NEEDLE MICRO U/F) 32G X 6 MM MISC 1 each by Other  route See admin instructions. Use one pen needle to inject insulin 4 times daily. **must have appointment for additional refills.** 150 each 0   Insulin Pen Needle (BD PEN NEEDLE MICRO U/F) 32G X 6 MM MISC USE 1 PEN NEEDLE SIX TIMES DAILY (Patient taking differently: 1 each by Other route See admin instructions. USE 1 PEN NEEDLE SIX TIMES DAILY) 200 each 2   Iron-Folic Acid-Vit B12 (IRON FORMULA PO) Take by mouth daily.     loperamide (IMODIUM) 2 MG capsule Take 1 capsule by mouth once daily 30 capsule 0   MELATONIN PO Take 30 mg by mouth at bedtime.     montelukast (SINGULAIR) 10 MG tablet Take 10 mg by mouth at bedtime.     Multiple Vitamin (MULTIVITAMIN WITH MINERALS) TABS tablet Take 1 tablet by mouth daily.     Na Sulfate-K Sulfate-Mg Sulf 17.5-3.13-1.6 GM/177ML SOLN Take by mouth once.     nebivolol (BYSTOLIC) 10 MG tablet Take 1 tablet (10 mg total) by mouth daily. 90 tablet 3   ondansetron (ZOFRAN) 4 MG tablet TAKE 1 TABLET BY MOUTH EVERY 4 TO 6 HOURS AS NEEDED FOR NAUSEA 90 tablet 0   ondansetron (ZOFRAN-ODT) 4 MG disintegrating tablet Take 1-2 tablets (4-8 mg total) by mouth every 6 (six) hours as needed for nausea. 30 tablet 5   pantoprazole (PROTONIX) 40 MG tablet Take 1 tablet by mouth twice daily 60 tablet 3   potassium chloride SA (KLOR-CON M) 20 MEQ tablet 20 mEq daily (Patient taking differently: Take 20 mEq by mouth once a week. 20 mEq daily) 36 tablet 0   prazosin (MINIPRESS) 1 MG capsule Take 1 mg by mouth at bedtime.     rosuvastatin (CRESTOR) 20 MG tablet Take 1 tablet by mouth once daily 90 tablet 3   sertraline (ZOLOFT) 50 MG tablet Take 50 mg by mouth 2 (two) times daily.     traZODone (DESYREL) 100 MG tablet Take 100 mg by mouth at bedtime.  0   VENTOLIN HFA 108 (90 Base) MCG/ACT inhaler Inhale 1 puff into the lungs every 4 (four) hours as needed.     No current facility-administered medications for this visit.    SURGICAL HISTORY:  Past Surgical History:  Procedure  Laterality Date   ABCESS DRAINAGE     gluteal area   COLONOSCOPY     INGUINAL HERNIA REPAIR N/A 08/15/2015   Procedure: HERNIA REPAIR INGUINAL INCARCERATED;  Surgeon: Ovidio Kin, MD;  Location: [facility] ORS;  Service: General;  Laterality: N/A;  with MESH   pyloric stenosis     TONSILLECTOMY      REVIEW OF SYSTEMS:   Review of Systems  Constitutional: Negative for appetite change, chills, fatigue, fever and unexpected weight change.  HENT:   Negative for mouth sores, nosebleeds, sore throat and trouble swallowing.   Eyes: Negative for eye problems and icterus.  Respiratory: Negative for cough, hemoptysis, shortness of breath and wheezing.   Cardiovascular: Negative for chest pain and leg swelling.  Gastrointestinal: Negative for abdominal pain, constipation, diarrhea, nausea and vomiting.  Genitourinary: Negative for bladder incontinence, difficulty urinating, dysuria, frequency and hematuria.   Musculoskeletal: Negative for back pain, gait problem, neck pain and neck stiffness.  Skin: Negative for itching and rash.  Neurological: Negative for dizziness, extremity weakness, gait problem, headaches, light-headedness and seizures.  Hematological: Negative for adenopathy. Does not bruise/bleed easily.  Psychiatric/Behavioral: Negative for confusion, depression and sleep disturbance. The patient is not nervous/anxious.     PHYSICAL EXAMINATION:  There were no vitals taken for this visit.  ECOG PERFORMANCE STATUS: {CHL ONC ECOG [id number]  Physical Exam  Constitutional: Oriented to person, place, and time and well-developed, well-nourished, and in no distress. No distress.  HENT:  Head: Normocephalic and atraumatic.  Mouth/Throat: Oropharynx is clear and moist. No oropharyngeal exudate.  Eyes: Conjunctivae are normal. Right eye exhibits no discharge. Left eye exhibits no discharge. No scleral icterus.  Neck: Normal range of motion. Neck supple.  Cardiovascular: Normal rate, regular  rhythm, normal heart sounds and intact distal pulses.   Pulmonary/Chest: Effort normal and breath sounds normal. No respiratory distress. No wheezes. No rales.  Abdominal: Soft. Bowel sounds are normal. Exhibits no distension and no mass. There is no tenderness.  Musculoskeletal: Normal range of motion. Exhibits no edema.  Lymphadenopathy:    No cervical adenopathy.  Neurological: Alert and oriented to person, place, and time. Exhibits normal muscle tone. Gait normal. Coordination normal.  Skin: Skin is warm and dry. No rash noted. Not diaphoretic. No erythema. No pallor.  Psychiatric: Mood, memory and judgment normal.  Vitals reviewed.  LABORATORY DATA: Lab Results  Component Value Date   WBC 3.6 (L) 04/01/2023   HGB 11.6 (L) 04/01/2023   HCT 33.6 (L) 04/01/2023   MCV 85.1 04/01/2023   PLT 153 04/01/2023      Chemistry      Component Value Date/Time   NA 139 04/01/2023 0907   NA 138 10/22/2022 1050   K 3.8 04/01/2023 0907   CL 108 04/01/2023 0907   CO2 24 04/01/2023 0907   BUN 28 (H) 04/01/2023 0907   BUN 31 (H) 10/22/2022 1050   CREATININE 2.47 (H) 04/01/2023 0907      Component Value Date/Time   CALCIUM 6.2 (LL) 04/01/2023 0907   CALCIUM 5.8 (LL) 03/21/2023 0359   ALKPHOS 58 04/01/2023 0907   AST 18 04/01/2023 0907   ALT 14 04/01/2023 0907   BILITOT 0.5 04/01/2023 0907       RADIOGRAPHIC STUDIES:  No results found.   ASSESSMENT/PLAN:  This is a very pleasant 68 year old Caucasian male with anemia and intermittent leukopenia and thrombocytopenia. He also has a history of metastatic melanoma status post 2 years of immunotherapy with Rande Lawman which was completed in 2020 followed by Dr. Allyne Gee at [facility] hematology oncology.   The patient was referred to the clinic for pancytopenia. He had several labs today including ***  She was seen with Dr. Arbutus Ped today.  His lab work shows ***. His white blood cell count is slightly low at ***. His hemoglobin is  slightly low at *** and his platelet count is only slightly low at ***K. CMP continues to show renal insufficiency although his creatinine is *** today which is better compared to prior.   Discussed with Dr. Arbutus Ped. He recommends ***   ***Dr. Arbutus Ped does not feel that the  patient needs invasive workup at this time such as a bone marrow biopsy and aspirate and recommends that we see him back for follow-up in 3 months. If he continues to have downtrending numbers, then the next step would be to consider a bone marrow biopsy and aspirate to rule out bone marrow pathology of his pancytopenia. ***   Discussed the importance of continuing to follow with Dr. Allyne Gee regarding his history of metastatic melanoma.  I also discussed the importance of having a dermatologist for routine skin checks.   The patient was advised to call immediately if he has any concerning symptoms in the interval. The patient voices understanding of current disease status and treatment options and is in agreement with the current care plan. All questions were answered. The patient knows to call the clinic with any problems, questions or concerns. We can certainly see the patient much sooner if necessary   No orders of the defined types were placed in this encounter.    I spent {CHL ONC TIME VISIT - NGEXB:[id number]} counseling the patient face to face. The total time spent in the appointment was {CHL ONC TIME VISIT - UUVOZ:[id number]}.  Trezure Cronk L Anuoluwapo Mefferd, PA-C 04/26/23

## 2023-04-29 ENCOUNTER — Inpatient Hospital Stay: Payer: 59 | Attending: Physician Assistant | Admitting: Physician Assistant

## 2023-04-29 DIAGNOSIS — D61818 Other pancytopenia: Secondary | ICD-10-CM | POA: Diagnosis present

## 2023-04-29 DIAGNOSIS — Z85820 Personal history of malignant melanoma of skin: Secondary | ICD-10-CM | POA: Diagnosis not present

## 2023-04-29 DIAGNOSIS — R197 Diarrhea, unspecified: Secondary | ICD-10-CM | POA: Insufficient documentation

## 2023-04-29 DIAGNOSIS — E876 Hypokalemia: Secondary | ICD-10-CM | POA: Insufficient documentation

## 2023-04-29 DIAGNOSIS — R778 Other specified abnormalities of plasma proteins: Secondary | ICD-10-CM | POA: Diagnosis not present

## 2023-04-29 DIAGNOSIS — D472 Monoclonal gammopathy: Secondary | ICD-10-CM | POA: Diagnosis not present

## 2023-05-18 ENCOUNTER — Other Ambulatory Visit: Payer: Self-pay | Admitting: Physician Assistant

## 2023-05-30 ENCOUNTER — Ambulatory Visit (AMBULATORY_SURGERY_CENTER): Payer: 59

## 2023-05-30 VITALS — Ht 71.0 in | Wt 170.0 lb

## 2023-05-30 DIAGNOSIS — K2270 Barrett's esophagus without dysplasia: Secondary | ICD-10-CM

## 2023-05-30 DIAGNOSIS — R197 Diarrhea, unspecified: Secondary | ICD-10-CM

## 2023-05-30 NOTE — Progress Notes (Signed)

## 2023-06-10 ENCOUNTER — Encounter: Payer: Self-pay | Admitting: Gastroenterology

## 2023-06-10 ENCOUNTER — Ambulatory Visit: Payer: 59 | Admitting: Gastroenterology

## 2023-06-10 VITALS — BP 201/100 | HR 61 | Temp 97.5°F | Ht 71.0 in | Wt 170.0 lb

## 2023-06-10 DIAGNOSIS — R197 Diarrhea, unspecified: Secondary | ICD-10-CM

## 2023-06-10 DIAGNOSIS — K2270 Barrett's esophagus without dysplasia: Secondary | ICD-10-CM

## 2023-06-10 MED ORDER — SODIUM CHLORIDE 0.9 % IV SOLN: 500.0000 mL | Freq: Once | INTRAVENOUS | Status: DC

## 2023-06-10 NOTE — Progress Notes (Signed)
 Patient's blood pressure this morning has been noted to be in the 240s to 250s over 1 teens. Denies any chest pain/chest pressure/lightheadedness/dizziness/headaches. Only took one of his for blood pressure medications this morning. Will reassess and see if he is an adequate candidate for possible procedure otherwise may need to be rescheduled.  1105 Addendum Bps remain elevated. Patient needs to be rescheduled. He will be able to take 3/4 BP medications the day of his procedure to minimize needing to reschedule again.   Corliss Parish, MD [facility] Office # [phone]

## 2023-06-10 NOTE — Progress Notes (Deleted)
 GASTROENTEROLOGY PROCEDURE H&P NOTE   Primary Care Physician: Dois Davenport, MD  HPI: Jesse Lane is a 68 y.o. male who presents for EGD/Colonoscopy for history of Barrett's surveillance, nausea, vomiting, diarrhea evaluation.  Past Medical History:  Diagnosis Date   Allergy    Arthritis    Cataract    CHF (congestive heart failure) (HCC)    Diabetes mellitus type 2, uncontrolled    Diabetic neuropathy (HCC)    GERD (gastroesophageal reflux disease)    History of stomach ulcers    Hyperlipidemia    Hypertension    Melanoma (HCC)    mets to lymph node and intestines   Past Surgical History:  Procedure Laterality Date   ABCESS DRAINAGE     gluteal area   COLONOSCOPY     INGUINAL HERNIA REPAIR N/A 08/15/2015   Procedure: HERNIA REPAIR INGUINAL INCARCERATED;  Surgeon: Ovidio Kin, MD;  Location: [facility];  Service: General;  Laterality: N/A;  with MESH   pyloric stenosis     TONSILLECTOMY     Current Outpatient Medications  Medication Sig Dispense Refill   acetaminophen (TYLENOL) 500 MG tablet Take 1,000 mg by mouth every 6 (six) hours as needed for mild pain.     calcium carbonate (OS-CAL - DOSED IN MG OF ELEMENTAL CALCIUM) 1250 (500 Ca) MG tablet Take 1 tablet (1,250 mg total) by mouth 2 (two) times daily with a meal. 60 tablet 1   cyclobenzaprine (FLEXERIL) 10 MG tablet Take 10 mg by mouth at bedtime as needed. (Patient not taking: Reported on 05/30/2023)     divalproex (DEPAKOTE ER) 250 MG 24 hr tablet Take 250 mg by mouth daily. (Patient not taking: Reported on 05/30/2023)     FARXIGA 5 MG TABS tablet Take 5 mg by mouth daily. (Patient not taking: Reported on 04/17/2023)     feeding supplement (ENSURE ENLIVE / ENSURE PLUS) LIQD Take 237 mLs by mouth 2 (two) times daily between meals. (Patient not taking: Reported on 05/30/2023) 237 mL 12   hydrALAZINE (APRESOLINE) 50 MG tablet Take 1 tablet (50 mg total) by mouth 3 (three) times daily. 270 tablet 3   insulin degludec  (TRESIBA FLEXTOUCH) 100 UNIT/ML FlexTouch Pen Inject 20 Units into the skin daily before breakfast.     insulin lispro (HUMALOG) 100 UNIT/ML KwikPen Inject 5 Units into the skin 3 (three) times daily. Per sliding scale     Insulin Pen Needle (BD PEN NEEDLE MICRO U/F) 32G X 6 MM MISC 1 each by Other route See admin instructions. Use one pen needle to inject insulin 4 times daily. **must have appointment for additional refills.** 150 each 0   Insulin Pen Needle (BD PEN NEEDLE MICRO U/F) 32G X 6 MM MISC USE 1 PEN NEEDLE SIX TIMES DAILY (Patient taking differently: 1 each by Other route See admin instructions. USE 1 PEN NEEDLE SIX TIMES DAILY) 200 each 2   Iron-Folic Acid-Vit B12 (IRON FORMULA PO) Take by mouth daily.     loperamide (IMODIUM) 2 MG capsule Take 1 capsule by mouth once daily 30 capsule 0   MELATONIN PO Take 30 mg by mouth at bedtime.     montelukast (SINGULAIR) 10 MG tablet Take 10 mg by mouth at bedtime.     Multiple Vitamin (MULTIVITAMIN WITH MINERALS) TABS tablet Take 1 tablet by mouth daily.     nebivolol (BYSTOLIC) 10 MG tablet Take 1 tablet (10 mg total) by mouth daily. 90 tablet 3   ondansetron (ZOFRAN) 4  MG tablet TAKE 1 TABLET BY MOUTH EVERY 4 TO 6 HOURS AS NEEDED FOR NAUSEA 90 tablet 0   ondansetron (ZOFRAN-ODT) 4 MG disintegrating tablet Take 1-2 tablets (4-8 mg total) by mouth every 6 (six) hours as needed for nausea. 30 tablet 5   pantoprazole (PROTONIX) 40 MG tablet Take 1 tablet by mouth twice daily 60 tablet 0   potassium chloride SA (KLOR-CON M) 20 MEQ tablet 20 mEq daily (Patient taking differently: Take 20 mEq by mouth once a week. 20 mEq daily) 36 tablet 0   prazosin (MINIPRESS) 1 MG capsule Take 1 mg by mouth at bedtime.     rosuvastatin (CRESTOR) 20 MG tablet Take 1 tablet by mouth once daily 90 tablet 3   sertraline (ZOLOFT) 50 MG tablet Take 50 mg by mouth 2 (two) times daily.     traZODone (DESYREL) 100 MG tablet Take 100 mg by mouth at bedtime.  0   valsartan  (DIOVAN) 320 MG tablet Take 320 mg by mouth daily.     VENTOLIN HFA 108 (90 Base) MCG/ACT inhaler Inhale 1 puff into the lungs every 4 (four) hours as needed.     No current facility-administered medications for this visit.    Current Outpatient Medications:    acetaminophen (TYLENOL) 500 MG tablet, Take 1,000 mg by mouth every 6 (six) hours as needed for mild pain., Disp: , Rfl:    calcium carbonate (OS-CAL - DOSED IN MG OF ELEMENTAL CALCIUM) 1250 (500 Ca) MG tablet, Take 1 tablet (1,250 mg total) by mouth 2 (two) times daily with a meal., Disp: 60 tablet, Rfl: 1   cyclobenzaprine (FLEXERIL) 10 MG tablet, Take 10 mg by mouth at bedtime as needed. (Patient not taking: Reported on 05/30/2023), Disp: , Rfl:    divalproex (DEPAKOTE ER) 250 MG 24 hr tablet, Take 250 mg by mouth daily. (Patient not taking: Reported on 05/30/2023), Disp: , Rfl:    FARXIGA 5 MG TABS tablet, Take 5 mg by mouth daily. (Patient not taking: Reported on 04/17/2023), Disp: , Rfl:    feeding supplement (ENSURE ENLIVE / ENSURE PLUS) LIQD, Take 237 mLs by mouth 2 (two) times daily between meals. (Patient not taking: Reported on 05/30/2023), Disp: 237 mL, Rfl: 12   hydrALAZINE (APRESOLINE) 50 MG tablet, Take 1 tablet (50 mg total) by mouth 3 (three) times daily., Disp: 270 tablet, Rfl: 3   insulin degludec (TRESIBA FLEXTOUCH) 100 UNIT/ML FlexTouch Pen, Inject 20 Units into the skin daily before breakfast., Disp: , Rfl:    insulin lispro (HUMALOG) 100 UNIT/ML KwikPen, Inject 5 Units into the skin 3 (three) times daily. Per sliding scale, Disp: , Rfl:    Insulin Pen Needle (BD PEN NEEDLE MICRO U/F) 32G X 6 MM MISC, 1 each by Other route See admin instructions. Use one pen needle to inject insulin 4 times daily. **must have appointment for additional refills.**, Disp: 150 each, Rfl: 0   Insulin Pen Needle (BD PEN NEEDLE MICRO U/F) 32G X 6 MM MISC, USE 1 PEN NEEDLE SIX TIMES DAILY (Patient taking differently: 1 each by Other route See admin  instructions. USE 1 PEN NEEDLE SIX TIMES DAILY), Disp: 200 each, Rfl: 2   Iron-Folic Acid-Vit B12 (IRON FORMULA PO), Take by mouth daily., Disp: , Rfl:    loperamide (IMODIUM) 2 MG capsule, Take 1 capsule by mouth once daily, Disp: 30 capsule, Rfl: 0   MELATONIN PO, Take 30 mg by mouth at bedtime., Disp: , Rfl:    montelukast (SINGULAIR)  10 MG tablet, Take 10 mg by mouth at bedtime., Disp: , Rfl:    Multiple Vitamin (MULTIVITAMIN WITH MINERALS) TABS tablet, Take 1 tablet by mouth daily., Disp: , Rfl:    nebivolol (BYSTOLIC) 10 MG tablet, Take 1 tablet (10 mg total) by mouth daily., Disp: 90 tablet, Rfl: 3   ondansetron (ZOFRAN) 4 MG tablet, TAKE 1 TABLET BY MOUTH EVERY 4 TO 6 HOURS AS NEEDED FOR NAUSEA, Disp: 90 tablet, Rfl: 0   ondansetron (ZOFRAN-ODT) 4 MG disintegrating tablet, Take 1-2 tablets (4-8 mg total) by mouth every 6 (six) hours as needed for nausea., Disp: 30 tablet, Rfl: 5   pantoprazole (PROTONIX) 40 MG tablet, Take 1 tablet by mouth twice daily, Disp: 60 tablet, Rfl: 0   potassium chloride SA (KLOR-CON M) 20 MEQ tablet, 20 mEq daily (Patient taking differently: Take 20 mEq by mouth once a week. 20 mEq daily), Disp: 36 tablet, Rfl: 0   prazosin (MINIPRESS) 1 MG capsule, Take 1 mg by mouth at bedtime., Disp: , Rfl:    rosuvastatin (CRESTOR) 20 MG tablet, Take 1 tablet by mouth once daily, Disp: 90 tablet, Rfl: 3   sertraline (ZOLOFT) 50 MG tablet, Take 50 mg by mouth 2 (two) times daily., Disp: , Rfl:    traZODone (DESYREL) 100 MG tablet, Take 100 mg by mouth at bedtime., Disp: , Rfl: 0   valsartan (DIOVAN) 320 MG tablet, Take 320 mg by mouth daily., Disp: , Rfl:    VENTOLIN HFA 108 (90 Base) MCG/ACT inhaler, Inhale 1 puff into the lungs every 4 (four) hours as needed., Disp: , Rfl:  Allergies  Allergen Reactions   Contrast Media [Iodinated Contrast Media] Itching   Family History  Problem Relation Age of Onset   Breast cancer Mother    Bone cancer Mother    Melanoma Mother     Prostate cancer Father    Diabetes Paternal Aunt    Pancreatic cancer Maternal Grandfather    Colon cancer Neg Hx    Colon polyps Neg Hx    Esophageal cancer Neg Hx    Stomach cancer Neg Hx    Rectal cancer Neg Hx    Social History   Socioeconomic History   Marital status: Significant Other    Spouse name: Sharion Dove- S/O   Number of children: 1   Years of education: Not on file   Highest education level: Some college, no degree  Occupational History   Occupation: retired  Tobacco Use   Smoking status: Former    Current packs/day: 0.00    Average packs/day: 1 pack/day for 8.0 years (8.0 ttl pk-yrs)    Types: Cigarettes    Start date: 15    Quit date: 1998    Years since quitting: 27.1   Smokeless tobacco: Never  Vaping Use   Vaping status: Never Used  Substance and Sexual Activity   Alcohol use: Yes    Alcohol/week: 4.0 standard drinks of alcohol    Types: 4 Glasses of wine per week    Comment: occasionally   Drug use: Not Currently    Frequency: 7.0 times per week    Types: Marijuana    Comment: daily   Sexual activity: Not on file  Other Topics Concern   Not on file  Social History Narrative   Not on file   Social Drivers of Health   Financial Resource Strain: High Risk (05/04/2021)   Overall Financial Resource Strain (CARDIA)    Difficulty of Paying Living Expenses:  Hard  Food Insecurity: No Food Insecurity (03/21/2023)   Hunger Vital Sign    Worried About Running Out of Food in the Last Year: Never true    Ran Out of Food in the Last Year: Never true  Transportation Needs: No Transportation Needs (03/21/2023)   PRAPARE - Administrator, Civil Service (Medical): No    Lack of Transportation (Non-Medical): No  Physical Activity: Not on file  Stress: Not on file  Social Connections: Not on file  Intimate Partner Violence: Not At Risk (03/21/2023)   Humiliation, Afraid, Rape, and Kick questionnaire    Fear of Current or Ex-Partner: No     Emotionally Abused: No    Physically Abused: No    Sexually Abused: No    Physical Exam: There were no vitals filed for this visit. There is no height or weight on file to calculate BMI. GEN: NAD EYE: Sclerae anicteric ENT: MMM CV: Non-tachycardic GI: Soft, NT/ND NEURO:  Alert & Oriented x 3  Lab Results: No results for input(s): "WBC", "HGB", "HCT", "PLT" in the last 72 hours. BMET No results for input(s): "NA", "K", "CL", "CO2", "GLUCOSE", "BUN", "CREATININE", "CALCIUM" in the last 72 hours. LFT No results for input(s): "PROT", "ALBUMIN", "AST", "ALT", "ALKPHOS", "BILITOT", "BILIDIR", "IBILI" in the last 72 hours. PT/INR No results for input(s): "LABPROT", "INR" in the last 72 hours.   Impression / Plan: This is a 68 y.o.male who presents for EGD/Colonoscopy for history of Barrett's surveillance, nausea, vomiting, diarrhea evaluation.  The risks and benefits of endoscopic evaluation/treatment were discussed with the patient and/or family; these include but are not limited to the risk of perforation, infection, bleeding, missed lesions, lack of diagnosis, severe illness requiring hospitalization, as well as anesthesia and sedation related illnesses.  The patient's history has been reviewed, patient examined, no change in status, and deemed stable for procedure.  The patient and/or family is agreeable to proceed.    Corliss Parish, MD [facility] Office # [phone]

## 2023-06-10 NOTE — Progress Notes (Signed)
 Pt's states no medical or surgical changes since previsit or office visit.  During admission vital signs it was noted that pt's BP was elevated- 214/131, rechecked and was 201/100.   After pt in bed for a few minutes RN rechecked BP with dynamap: Left arm-256/114, Right arm-240/105.   Then RN did a manual check on right arm and it was 250/115.   Pt denies blurred vision, chest pain, headache or diaphoresis-though RN did not his back felt sweaty when assisting to the bathroom and face appears a little diaphoretic.   Pt only took 1 of his 4 BP medications this morning, the Bystolic. Pt reports he used Powerade for his prep because it has less sodium than Gatorade. Also reports that his systolic BP is usually about 135.   MD and CRNA made aware of BP findings and asked to let pt rest a few minutes and recheck. Recheck BP was 237/111. MD and CRNA discussed and decided to cancel the procedure for today for the safety of the patient.   Pt rescheduled for 07/09/23 at 2:30pm. New instructions printed and reviewed with pt. Instructed pt to take all of his BP medications the morning of the procedure, except for the Diovan (per CRNA instructions). Pt verbalized understanding.

## 2023-06-19 ENCOUNTER — Other Ambulatory Visit: Payer: Self-pay | Admitting: Physician Assistant

## 2023-07-03 ENCOUNTER — Telehealth: Payer: Self-pay | Admitting: Cardiovascular Disease

## 2023-07-03 NOTE — Telephone Encounter (Signed)
 Pt c/o BP issue: STAT if pt c/o blurred vision, one-sided weakness or slurred speech.  STAT if BP is GREATER than 180/120 TODAY.  STAT if BP is LESS than 90/60 and SYMPTOMATIC TODAY  1. What is your BP concern? At colonoscopy appt 3 wks ago it running in 200's, PCP appt yesterday 205/111, TODAY-192/90  2. Have you taken any BP medication today?yes morning dose  3. What are your last 5 BP readings?does not keep log, only has 205/11  4. Are you having any other symptoms (ex. Dizziness, headache, blurred vision, passed out)? No

## 2023-07-03 NOTE — Telephone Encounter (Signed)
 I called him and told him to increase his hydralazine to 100 mg 3 times a day and we will talk again on Friday morning.  Do you mind calling him Friday please and asking him how his blood pressure is running?  Thank you

## 2023-07-03 NOTE — Telephone Encounter (Signed)
 Patient identification verified by 2 forms. Shade Flood, RN     Called and spoke to patient  Patient states:  -At colonoscopy appt 3 wks ago it running in 200's, PCP appt yesterday 205/111, TODAY-192/90 - He has already taken all of his morning medications for his BP.  - Experienced some dizziness about three days ago, but nothing since then.   Patient denies:  - chest pain, vision changes, headache, slurred speech, one-sided weakness             Interventions/Plan: - Encounter forwarded to primary cardiologist for recommendations.  - Patient due for OV   Reviewed ED warning signs/precautions  Patient agrees with plan, no questions at this time

## 2023-07-05 NOTE — Telephone Encounter (Signed)
 Called and spoke to pt.  OV appt made for 07/10/23 with Jarome Lamas, NP.  Advised pt to bring in BP/HR log.

## 2023-07-05 NOTE — Telephone Encounter (Signed)
 LVM to CB. 07/05/2023 at 11:58am.

## 2023-07-05 NOTE — Telephone Encounter (Signed)
 Per after hours report, he was told to call in and report his BP.   He stated ist was 188/100 yesterday morning and 178/95 in the evening.

## 2023-07-05 NOTE — Telephone Encounter (Signed)
 Can we please make him an office appt with me or APP.

## 2023-07-09 ENCOUNTER — Encounter: Admitting: Gastroenterology

## 2023-07-10 ENCOUNTER — Ambulatory Visit: Admitting: Nurse Practitioner

## 2023-07-10 NOTE — Progress Notes (Deleted)
 Office Visit    Patient Name: Jesse Lane Date of Encounter: 07/10/2023  Primary Care Provider:  Dois Davenport, MD Primary Cardiologist:  Thurmon Fair, MD  Chief Complaint    68 year old male with a history of CAD, chronic diastolic heart failure, hypertension, hyperlipidemia, CVA, CKD stage IIIb, type 2 diabetes, metastatic melanoma, and GERD who presents for follow-up related to CAD, heart failure, and hypertension.  Past Medical History    Past Medical History:  Diagnosis Date   Allergy    Arthritis    Cataract    CHF (congestive heart failure) (HCC)    Diabetes mellitus type 2, uncontrolled    Diabetic neuropathy (HCC)    GERD (gastroesophageal reflux disease)    History of stomach ulcers    Hyperlipidemia    Hypertension    Melanoma (HCC)    mets to lymph node and intestines   Past Surgical History:  Procedure Laterality Date   ABCESS DRAINAGE     gluteal area   COLONOSCOPY     INGUINAL HERNIA REPAIR N/A 08/15/2015   Procedure: HERNIA REPAIR INGUINAL INCARCERATED;  Surgeon: Ovidio Kin, MD;  Location: [facility];  Service: General;  Laterality: N/A;  with MESH   pyloric stenosis     TONSILLECTOMY      Allergies  Allergies  Allergen Reactions   Contrast Media [Iodinated Contrast Media] Itching     Labs/Other Studies Reviewed    The following studies were reviewed today:  Cardiac Studies & Procedures   ______________________________________________________________________________________________     ECHOCARDIOGRAM  ECHOCARDIOGRAM COMPLETE 04/13/2021  Narrative ECHOCARDIOGRAM REPORT    Patient Name:   Jesse Lane Date of Exam: 04/13/2021 Medical Rec #:  [id number]        Height:       71.0 in Accession #:    [id number]       Weight:       200.0 lb Date of Birth:  February 04, 1956        BSA:          2.109 m Patient Age:    65 years         BP:           163/84 mmHg Patient Gender: M                HR:           65 bpm. Exam Location:   Inpatient  Procedure: 2D Echo, Cardiac Doppler and Color Doppler  Indications:    CHF  History:        Patient has prior history of Echocardiogram examinations, most recent 03/09/2021. Stroke; Risk Factors:Diabetes and Hypertension.  Sonographer:    Vanetta Shawl Referring Phys: [id number] Delila Pereyra A KYLE  IMPRESSIONS   1. Left ventricular ejection fraction, by estimation, is 60 to 65%. The left ventricle has normal function. The left ventricle has no regional wall motion abnormalities. There is mild left ventricular hypertrophy. Left ventricular diastolic parameters are indeterminate. 2. Right ventricular systolic function is normal. The right ventricular size is normal. 3. The mitral valve is normal in structure. Trivial mitral valve regurgitation. 4. The aortic valve is normal in structure. Aortic valve regurgitation is not visualized. 5. The inferior vena cava is dilated in size with <50% respiratory variability, suggesting right atrial pressure of 15 mmHg.  Comparison(s): The left ventricular function is unchanged.  FINDINGS Left Ventricle: Left ventricular ejection fraction, by estimation, is 60 to 65%. The left ventricle has normal function. The  left ventricle has no regional wall motion abnormalities. The left ventricular internal cavity size was normal in size. There is mild left ventricular hypertrophy. Left ventricular diastolic parameters are indeterminate.  Right Ventricle: The right ventricular size is normal. Right vetricular wall thickness was not assessed. Right ventricular systolic function is normal.  Left Atrium: Left atrial size was normal in size.  Right Atrium: Right atrial size was normal in size.  Pericardium: There is no evidence of pericardial effusion.  Mitral Valve: The mitral valve is normal in structure. Trivial mitral valve regurgitation.  Tricuspid Valve: The tricuspid valve is normal in structure. Tricuspid valve regurgitation is trivial.  Aortic  Valve: The aortic valve is normal in structure. Aortic valve regurgitation is not visualized. Aortic valve mean gradient measures 4.0 mmHg. Aortic valve peak gradient measures 6.9 mmHg. Aortic valve area, by VTI measures 1.79 cm.  Pulmonic Valve: The pulmonic valve was not well visualized. Pulmonic valve regurgitation is trivial.  Aorta: The aortic root and ascending aorta are structurally normal, with no evidence of dilitation.  Venous: The inferior vena cava is dilated in size with less than 50% respiratory variability, suggesting right atrial pressure of 15 mmHg.  IAS/Shunts: No atrial level shunt detected by color flow Doppler.   LEFT VENTRICLE PLAX 2D LVIDd:         5.00 cm   Diastology LVIDs:         3.20 cm   LV e' medial:    6.53 cm/s LV PW:         1.30 cm   LV E/e' medial:  12.8 LV IVS:        1.30 cm   LV e' lateral:   6.96 cm/s LVOT diam:     2.00 cm   LV E/e' lateral: 12.0 LV SV:         61 LV SV Index:   29 LVOT Area:     3.14 cm   RIGHT VENTRICLE             IVC RV Basal diam:  5.30 cm     IVC diam: 2.60 cm RV Mid diam:    3.90 cm RV S prime:     12.50 cm/s  LEFT ATRIUM             Index        RIGHT ATRIUM           Index LA diam:        4.70 cm 2.23 cm/m   RA Area:     23.90 cm LA Vol (A2C):   74.2 ml 35.19 ml/m  RA Volume:   69.80 ml  33.10 ml/m LA Vol (A4C):   60.5 ml 28.69 ml/m LA Biplane Vol: 67.9 ml 32.20 ml/m AORTIC VALVE AV Area (Vmax):    1.91 cm AV Area (Vmean):   1.83 cm AV Area (VTI):     1.79 cm AV Vmax:           131.00 cm/s AV Vmean:          90.400 cm/s AV VTI:            0.343 m AV Peak Grad:      6.9 mmHg AV Mean Grad:      4.0 mmHg LVOT Vmax:         79.60 cm/s LVOT Vmean:        52.800 cm/s LVOT VTI:          0.195 m LVOT/AV  VTI ratio: 0.57  AORTA Ao Root diam: 3.40 cm Ao Asc diam:  3.50 cm  MITRAL VALVE MV Area (PHT): 4.15 cm    SHUNTS MV Decel Time: 183 msec    Systemic VTI:  0.20 m MV E velocity: 83.60 cm/s   Systemic Diam: 2.00 cm MV A velocity: 42.40 cm/s MV E/A ratio:  1.97  Dietrich Pates MD Electronically signed by Dietrich Pates MD Signature Date/Time: 04/13/2021/3:59:00 PM    Final      CT SCANS  CT CORONARY FRACTIONAL FLOW RESERVE DATA PREP 05/18/2021  Narrative EXAM: CT FFR ANALYSIS  CLINICAL DATA:  Dyspnea on exertion (DOE)  FINDINGS: FFRct analysis was performed on the original cardiac CT angiogram dataset. Diagrammatic representation of the FFRct analysis is provided in a separate PDF document in PACS. This dictation was created using the PDF document and an interactive 3D model of the results. 3D model is not available in the EMR/PACS. Normal FFR range is >0.80.  1. Left Main:  No significant stenosis. FFR = 1.00  2. LAD: No significant stenosis. Proximal FFR = 0.94, Mid FFR = 0.82, Distal FFR = 0.69 3. LCX: No significant stenosis. Proximal FFR = 0.99, Distal FFR = 0.89 4. RCA: Significant stenosis. Proximal FFR = 0.99, Mid FFR = 0.77, Distal FFR = 0.75  IMPRESSION: 1. CT FFR demonstrates significant discrete stenosis of the mid RCA which is flow-limiting.  2. There is tapering flow limitation in the mid-distal LAD, not necessarily attributable to a discrete lesion.  3.  Consider further evaluation with cardiac catheterization.   Electronically Signed By: Chrystie Nose M.D. On: 05/18/2021 14:45   CT SCANS  CT CORONARY MORPH W/CTA COR W/SCORE 05/18/2021  Addendum 05/18/2021  1:48 PM ADDENDUM REPORT: 05/18/2021 13:45  HISTORY: 68 yo male with heart failure, known or suspected, initial workup  EXAM: Cardiac/Coronary CTA  TECHNIQUE: The patient was scanned on a [employer].  PROTOCOL: A 120 kV prospective scan was triggered in the descending thoracic aorta at 111 HU's. Axial non-contrast 3 mm slices were carried out through the heart. The data set was analyzed on a dedicated work station and scored using the Agatson method. Gantry rotation  speed was 250 msecs and collimation was .6 mm. Beta blockade and 0.8 mg of sl NTG was given. The 3D data set was reconstructed in 5% intervals of the 35-75 % of the R-R cycle. Diastolic phases were analyzed on a dedicated work station using MPR, MIP and VRT modes. The patient received 95mL OMNIPAQUE IOHEXOL 350 MG/ML SOLN of contrast.  FINDINGS: Quality: Good, HR 60  Coronary calcium score: The patient's coronary artery calcium score is 543, which places the patient in the 82nd percentile.  Coronary arteries: Normal coronary origins.  Right dominance.  Right Coronary Artery: Dominant. Mild mixed diffuse CAD (1-24%, CADRADS1). Large acute marginal branch without disease.  Left Main Coronary Artery: Normal. Bifurcates into the LAD and LCx arteries.  Left Anterior Descending Coronary Artery: Large anterior vessel which reaches the apex. There is a non-calcified low attenuation plaque of the proximal LAD with moderate stenosis 50-69% (CADRADS3). There is mixed plaque of the mid-LAD which appears mildly stenotic, 25-49% (CADRADS2).  Left Circumflex Artery: AV groove vessel with long 13 mm proximal segment of minimal mixed 1-24% stenosis (CADRADS1). Large mid-vessel OM1 branch without stenosis.  Aorta: Normal size, 37 mm at the mid ascending aorta (level of the PA bifurcation) measured double oblique. Aortic atherosclerosis. No dissection.  Aortic Valve: Trileaflet. No calcifications.  Other findings:  Normal pulmonary vein drainage into the left atrium.  Normal left atrial appendage without a thrombus.  Dilated main pulmonary artery at 32 mm, suggestive of pulmonary hypertension  IMPRESSION: 1. Mild to moderate, probably mixed calcific and non-calcific CAD, probably non-obstructive, CADRADS = 3. CT FFR will be performed and reported separately.  2. Coronary calcium score of 543. This was 82nd percentile for age and sex matched control.  3. Normal coronary origin with  right dominance.  4. Dilated main pulmonary artery at 32 mm, suggestive of pulmonary hypertension  5. Aortic atherosclerosis.   Electronically Signed By: Chrystie Nose M.D. On: 05/18/2021 13:45  Narrative EXAM: OVER-READ INTERPRETATION  CT CHEST  The following report is an over-read performed by radiologist Dr. Trudie Reed of [facility] on 05/18/2021. This over-read does not include interpretation of cardiac or coronary anatomy or pathology. The coronary calcium score/coronary CTA interpretation by the cardiologist is attached.  COMPARISON:  None.  FINDINGS: Atherosclerotic calcifications are noted in the thoracic aorta. Within the visualized portions of the thorax there are no suspicious appearing pulmonary nodules or masses, there is no acute consolidative airspace disease, no pleural effusions, no pneumothorax and no lymphadenopathy. Visualized portions of the upper abdomen are unremarkable. There are no aggressive appearing lytic or blastic lesions noted in the visualized portions of the skeleton.  IMPRESSION: 1.  Aortic Atherosclerosis (ICD10-I70.0).  Electronically Signed: By: Trudie Reed M.D. On: 05/18/2021 11:55     ______________________________________________________________________________________________     Recent Labs: 03/21/2023: B Natriuretic Peptide 316.3 03/23/2023: Magnesium 2.2 04/01/2023: ALT 14; BUN 28; Creatinine 2.47; Hemoglobin 11.6; Platelet Count 153; Potassium 3.8; Sodium 139  Recent Lipid Panel    Component Value Date/Time   CHOL 111 08/29/2022 1009   TRIG 156 (H) 08/29/2022 1009   HDL 43 08/29/2022 1009   CHOLHDL 2.6 08/29/2022 1009   CHOLHDL 6.5 10/17/2019 0255   VLDL 37 10/17/2019 0255   LDLCALC 42 08/29/2022 1009    History of Present Illness    68 year old male with the above past medical history including CAD, chronic diastolic heart failure, hypertension, hyperlipidemia, CVA, CKD stage IIIb,  type 2 diabetes, metastatic melanoma, and GERD.  Echocardiogram in 04/2021 showed EF 60 to 65%, no RWMA, mild LVH, indeterminate diastolic parameters, normal RV systolic function, no significant valvular abnormalities.  Coronary CTA with FFR in 05/2021 revealed coronary calcium score 543 (82nd percentile), mild to moderate probable mixed calcific and noncalcific CAD, significant stenosis in the mid RCA per FFR.  In the absence of any symptoms, cardiac catheterization was deferred and medical management was advised.  Bilateral renal ultrasound in 06/2022 showed no evidence of renal artery stenosis.  He was hospitalized in December 2024 in the setting of AKI.  Farxiga, HCTZ, and lisinopril were discontinued.  He was last seen in the office on 04/17/2023 and was stable from a cardiac standpoint.  He contacted our office on 07/05/2023 with concern for elevated BP.  He was advised to increase his hydralazine to 100 mg 3 times daily.  He presents today for follow-up.  Since his last visit  Hypertension: CAD: Chronic diastolic heart failure: History of CVA: CKD stage IIIb: Type 2 diabetes: Disposition:  Home Medications    Current Outpatient Medications  Medication Sig Dispense Refill   acetaminophen (TYLENOL) 500 MG tablet Take 1,000 mg by mouth every 6 (six) hours as needed for mild pain.     calcium carbonate (OS-CAL - DOSED IN MG OF ELEMENTAL CALCIUM) 1250 (  500 Ca) MG tablet Take 1 tablet (1,250 mg total) by mouth 2 (two) times daily with a meal. 60 tablet 1   cyclobenzaprine (FLEXERIL) 10 MG tablet Take 10 mg by mouth at bedtime as needed. (Patient not taking: Reported on 05/30/2023)     divalproex (DEPAKOTE ER) 250 MG 24 hr tablet Take 250 mg by mouth daily. (Patient not taking: Reported on 05/30/2023)     FARXIGA 5 MG TABS tablet Take 5 mg by mouth daily. (Patient not taking: Reported on 04/17/2023)     feeding supplement (ENSURE ENLIVE / ENSURE PLUS) LIQD Take 237 mLs by mouth 2 (two) times daily  between meals. (Patient not taking: Reported on 05/30/2023) 237 mL 12   hydrALAZINE (APRESOLINE) 50 MG tablet Take 1 tablet (50 mg total) by mouth 3 (three) times daily. 270 tablet 3   insulin degludec (TRESIBA FLEXTOUCH) 100 UNIT/ML FlexTouch Pen Inject 20 Units into the skin daily before breakfast.     insulin lispro (HUMALOG) 100 UNIT/ML KwikPen Inject 5 Units into the skin 3 (three) times daily. Per sliding scale     Insulin Pen Needle (BD PEN NEEDLE MICRO U/F) 32G X 6 MM MISC 1 each by Other route See admin instructions. Use one pen needle to inject insulin 4 times daily. **must have appointment for additional refills.** 150 each 0   Insulin Pen Needle (BD PEN NEEDLE MICRO U/F) 32G X 6 MM MISC USE 1 PEN NEEDLE SIX TIMES DAILY (Patient taking differently: 1 each by Other route See admin instructions. USE 1 PEN NEEDLE SIX TIMES DAILY) 200 each 2   Iron-Folic Acid-Vit B12 (IRON FORMULA PO) Take by mouth daily.     loperamide (IMODIUM) 2 MG capsule Take 1 capsule by mouth once daily 30 capsule 0   MELATONIN PO Take 30 mg by mouth at bedtime.     montelukast (SINGULAIR) 10 MG tablet Take 10 mg by mouth at bedtime.     Multiple Vitamin (MULTIVITAMIN WITH MINERALS) TABS tablet Take 1 tablet by mouth daily.     nebivolol (BYSTOLIC) 10 MG tablet Take 1 tablet (10 mg total) by mouth daily. 90 tablet 3   ondansetron (ZOFRAN-ODT) 4 MG disintegrating tablet Take 1-2 tablets (4-8 mg total) by mouth every 6 (six) hours as needed for nausea. 30 tablet 5   pantoprazole (PROTONIX) 40 MG tablet Take 1 tablet by mouth twice daily 60 tablet 0   potassium chloride SA (KLOR-CON M) 20 MEQ tablet 20 mEq daily (Patient taking differently: Take 20 mEq by mouth once a week. 20 mEq daily) 36 tablet 0   prazosin (MINIPRESS) 1 MG capsule Take 1 mg by mouth at bedtime.     rosuvastatin (CRESTOR) 20 MG tablet Take 1 tablet by mouth once daily 90 tablet 3   sertraline (ZOLOFT) 50 MG tablet Take 50 mg by mouth 2 (two) times  daily.     traZODone (DESYREL) 100 MG tablet Take 100 mg by mouth at bedtime.  0   valsartan (DIOVAN) 320 MG tablet Take 320 mg by mouth daily.     VENTOLIN HFA 108 (90 Base) MCG/ACT inhaler Inhale 1 puff into the lungs every 4 (four) hours as needed.     No current facility-administered medications for this visit.     Review of Systems    ***.  All other systems reviewed and are otherwise negative except as noted above.    Physical Exam    VS:  There were no vitals taken for this visit. ,  BMI There is no height or weight on file to calculate BMI.     GEN: Well nourished, well developed, in no acute distress. HEENT: normal. Neck: Supple, no JVD, carotid bruits, or masses. Cardiac: RRR, no murmurs, rubs, or gallops. No clubbing, cyanosis, edema.  Radials/DP/PT 2+ and equal bilaterally.  Respiratory:  Respirations regular and unlabored, clear to auscultation bilaterally. GI: Soft, nontender, nondistended, BS + x 4. MS: no deformity or atrophy. Skin: warm and dry, no rash. Neuro:  Strength and sensation are intact. Psych: Normal affect.  Accessory Clinical Findings    ECG personally reviewed by me today -    - no acute changes.   Lab Results  Component Value Date   WBC 3.6 (L) 04/01/2023   HGB 11.6 (L) 04/01/2023   HCT 33.6 (L) 04/01/2023   MCV 85.1 04/01/2023   PLT 153 04/01/2023   Lab Results  Component Value Date   CREATININE 2.47 (H) 04/01/2023   BUN 28 (H) 04/01/2023   NA 139 04/01/2023   K 3.8 04/01/2023   CL 108 04/01/2023   CO2 24 04/01/2023   Lab Results  Component Value Date   ALT 14 04/01/2023   AST 18 04/01/2023   ALKPHOS 58 04/01/2023   BILITOT 0.5 04/01/2023   Lab Results  Component Value Date   CHOL 111 08/29/2022   HDL 43 08/29/2022   LDLCALC 42 08/29/2022   TRIG 156 (H) 08/29/2022   CHOLHDL 2.6 08/29/2022    Lab Results  Component Value Date   HGBA1C 5.8 (H) 03/21/2023    Assessment & Plan    1.  ***  No BP recorded.  {Refresh  Note OR Click here to enter BP  :1}***   Joylene Grapes, NP 07/10/2023, 9:37 AM

## 2023-07-14 NOTE — Progress Notes (Unsigned)
 Cardiology Office Note:    Date:  07/16/2023   ID:  Jesse Lane, DOB [birth year], MRN [id number]  PCP:  Dois Davenport, MD  Cardiologist:  Thurmon Fair, MD     Referring MD: Dois Davenport, MD   Chief Complaint: elevated BP  History of Present Illness:    Jesse Lane is a 68 y.o. male with a history of CAD noted on coronary CTA in 05/2021 (treated medically), chronic diastolic CHF, CVA, hypertension, hyperlipidemia, insulin-dependent type 2 diabetes mellitus, CKD stage IIIb, GERD, metastatic melanoma, and MGUS who is followed by Dr Royann Shivers and presents today for follow-up of hypertension.   Patient was admitted in early 04/2021 for acute CHF. Echo showed LVEF of 60-65% with normal wall motion and mild LHV. He was diuresed and discharged on PO Lasix.  However, he was readmitted later that month again for acute on chronic diastolic CHF. He was again diuresed. High-sensitivity troponin was mildly elevated consistent with demand ischemia. However, prior chest CT had shown coronary artery calcifications so outpatient coronary CTA was ordered and showed a coronary calcium score of 543 (82nd percentile for age and sex) and mild to moderate non-obstructive CAD; however, FFR was positive in the RCA. Decision was made to treat this medically given he was not have any chest pain.Renal artery ultrasound in 06/2022 showed no renal artery stenosis.   He was admitted in 03/2023 for AKI with creatinine as high as 3.5 in setting of GI illnesses. He was also noted to have significant hypokalemia He was treated with IV fluids and Lisinopril, Farxiga, and HCTZ were all held.  He was last seen by Juanda Crumble, PA-C, in 04/2023 at which time he was doing well from a cardiac standpoint.   Patient called our office on 07/05/2023 with reports of markedly elevated BP. He was advised to increase Hydralazine to 100mg  three times daily. This visit was arranged for further evaluation. He states his BP was  well controlled (around 135/80) until about 1 month ago. Since then, his systolic BP has frequently been in the 200s. BP was initially 240/100 in the office and then was 230/100 on my personal recheck at 2:26pm despite him taking all of his BP medications so far today.  He states his BP at home about 30 minutes before coming to the office was 185/88. He denies any symptoms. No chest pain, shortness of breath, orthopnea, PND, edema, palpitations. He notes minimal lightheadedness/ dizziness at times but states this is not new and is not related to his elevated BP. No syncope. He reports a little "hazy" vision which he states started after he used marijuana around 11am. He states this is not uncommon for him after he uses marijuana. No other stroke like symptoms. He reports a lot of "stomach issues" and states he wakes up vomiting. However, this is not new. He states his PCP has been working this up for 2 years.   He was given a dose of Clonidine 0.1mg  but BP only improved slightly to 218/98 after about 50 minutes.  EKGs/Labs/Other Studies Reviewed:    The following studies were reviewed:  Echocardiogram 04/13/2021: Impressions: 1. Left ventricular ejection fraction, by estimation, is 60 to 65%. The  left ventricle has normal function. The left ventricle has no regional  wall motion abnormalities. There is mild left ventricular hypertrophy.  Left ventricular diastolic parameters  are indeterminate.   2. Right ventricular systolic function is normal. The right ventricular  size is normal.  3. The mitral valve is normal in structure. Trivial mitral valve  regurgitation.   4. The aortic valve is normal in structure. Aortic valve regurgitation is  not visualized.   5. The inferior vena cava is dilated in size with <50% respiratory  variability, suggesting right atrial pressure of 15 mmHg.  _______________  Coronary CTA 05/18/2021: Impression: 1. Mild to moderate, probably mixed calcific and  non-calcific CAD, probably non-obstructive, CADRADS = 3. CT FFR will be performed and reported separately. 2. Coronary calcium score of 543. This was 82nd percentile for age and sex matched control. 3. Normal coronary origin with right dominance. 4. Dilated main pulmonary artery at 32 mm, suggestive of pulmonary hypertension 5. Aortic atherosclerosis.  FFR Summary: 1. CT FFR demonstrates significant discrete stenosis of the mid RCA which is flow-limiting. 2. There is tapering flow limitation in the mid-distal LAD, not necessarily attributable to a discrete lesion.   EKG:  EKG not ordered today.   Recent Labs: 03/21/2023: B Natriuretic Peptide 316.3 03/23/2023: Magnesium 2.2 04/01/2023: ALT 14; BUN 28; Creatinine 2.47; Hemoglobin 11.6; Platelet Count 153; Potassium 3.8; Sodium 139  Recent Lipid Panel    Component Value Date/Time   CHOL 111 08/29/2022 1009   TRIG 156 (H) 08/29/2022 1009   HDL 43 08/29/2022 1009   CHOLHDL 2.6 08/29/2022 1009   CHOLHDL 6.5 10/17/2019 0255   VLDL 37 10/17/2019 0255   LDLCALC 42 08/29/2022 1009    Physical Exam:    Vital Signs: BP (!) 218/98   Pulse (!) 58   Ht 5\' 11"  (1.803 m)   Wt 173 lb (78.5 kg)   SpO2 98%   BMI 24.13 kg/m     Wt Readings from Last 3 Encounters:  07/16/23 173 lb (78.5 kg)  06/10/23 170 lb (77.1 kg)  05/30/23 170 lb (77.1 kg)     General: 68 y.o. Caucasian male in no acute distress. HEENT: Normocephalic and atraumatic. Sclera clear.  Neck: Supple. No carotid bruits. No JVD. Heart: RRR. Possible very soft systolic murmur.  Lungs: No increased work of breathing. Clear to ausculation bilaterally. No wheezes, rhonchi, or rales.  Extremities: No lower extremity edema.  Skin: Warm and dry. Neuro: No focal deficits. Psych: Normal affect. Responds appropriately.   Assessment:    1. Essential hypertension   2. Coronary artery disease involving native coronary artery of native heart without angina pectoris   3.  Chronic diastolic CHF (congestive heart failure) (HCC)   4. Hyperlipidemia, unspecified hyperlipidemia type   5. Type 2 diabetes mellitus with complication, with long-term current use of insulin (HCC)   6. Chronic kidney disease (CKD), stage IV (severe) (HCC)     Plan:    Hypertension Patient called our office a couple of days ago with reports of markedly elevated BP with systolic BP as high as the 200s. His Hydralazine was increased and this visit was arranged. Renal artery ultrasound in 06/2022 showed no evidence of renal artery stenosis. - Markedly hypertensive in the office with BP initially 240/100 and then 230/100. He was given Clonidine 0.1mg  and then monitored for about 1 hour. However, BP only improved slightly to 218/98. He is asymptomatic with this with no chest pain, shortness o breath, or stroke syptoms.  - Current medications: Valsartan 320mg  daily, Bystolic 10mg  daily, Hydralazine 100mg  three times daily, Kerendia 10mg  daily, and Prazosin 1mg  daily (in the evenings). He was previously on Amlodipine but this was stopped a few months ago. He does not remember why he was stopped.  He does not remember having lower extremity edema or anything other side effects with this.  Therefore, will restart Amlodipine 10mg  daily. He was instructed to take this as soon as he gets home and then again tomorrow morning.  - Discussed that marijuana can cause elevated BP.  - Discussed patient with his primary Cardiologist (Dr. Royann Shivers) - given he is asymptomatic, will hold of on sending him to the ED right now. Will add Amlodipine as above and have him come back in tomorrow at 11:40am to see Dr. Royann Shivers for close follow-up. However, he was counseled multiple times to call 911 and go to the ED if he developed any chest pain, shortness of breath, or stroke like symptoms. Patient and fiance voiced understanding and agreed.  CAD Coronary CTA in 05/2021 showed a coronary calcium score of 543 (82nd percentile for  age and sex) and mild to moderate non-obstructive CAD; however, FFR was positive in the RCA. Decision was made to treat this medically given he was not have any chest pain. - No chest pain.  - Continue Crestor 20mg  daily.  - Recommend starting Aspirin 81mg  daily.   Chronic Diastolic CHF Last Echo in 04/2021 showed LVEF of 60-65% with normal wall motion and mild LVH.  - Euvolemic on exam.  - No need for diuretics at this time. - Previously on Farxiga but this was stopped by PCP because it was "drying him out.". Will remove from medication list.   Hyperlipidemia Lipid panel in 08/2022: Total Cholesterol 111, Triglycerides 156, HDL 43, LDL 42. LDL goal <70 given CAD. - Continue Crestor 20mg  daily.  Type 2 Diabetes Mellitus - On Insulin (Humalog and [place]).   CKD Stage IV Baseline creatinine previously around 14 to 1.5 in fall of 2024. However, he was admitted in 03/2023 with AKI in setting of GI illness with creatinine as high as 3.55. Most recent creatinine on 04/01/2023 was 2.47.  - PCP recently started [place] 10mg  daily.  - Renal function is being closely monitored by PCP. He states he had labs drawn yesterday but I am unable to see these. He has also been referred to Nephrology.   Disposition: Follow-up with Dr. Royann Shivers tomorrow.    Signed, Corrin Parker, PA-C  07/16/2023 9:37 PM    [facility]

## 2023-07-15 ENCOUNTER — Inpatient Hospital Stay: Payer: 59 | Attending: Physician Assistant

## 2023-07-16 ENCOUNTER — Ambulatory Visit: Attending: Student | Admitting: Student

## 2023-07-16 ENCOUNTER — Encounter: Payer: Self-pay | Admitting: Student

## 2023-07-16 VITALS — BP 218/98 | HR 58 | Ht 71.0 in | Wt 173.0 lb

## 2023-07-16 DIAGNOSIS — I5032 Chronic diastolic (congestive) heart failure: Secondary | ICD-10-CM | POA: Diagnosis not present

## 2023-07-16 DIAGNOSIS — E118 Type 2 diabetes mellitus with unspecified complications: Secondary | ICD-10-CM

## 2023-07-16 DIAGNOSIS — I10 Essential (primary) hypertension: Secondary | ICD-10-CM | POA: Diagnosis not present

## 2023-07-16 DIAGNOSIS — Z794 Long term (current) use of insulin: Secondary | ICD-10-CM

## 2023-07-16 DIAGNOSIS — I2510 Atherosclerotic heart disease of native coronary artery without angina pectoris: Secondary | ICD-10-CM | POA: Diagnosis not present

## 2023-07-16 DIAGNOSIS — E785 Hyperlipidemia, unspecified: Secondary | ICD-10-CM | POA: Diagnosis not present

## 2023-07-16 DIAGNOSIS — N184 Chronic kidney disease, stage 4 (severe): Secondary | ICD-10-CM

## 2023-07-16 MED ORDER — HYDRALAZINE HCL 50 MG PO TABS
100.0000 mg | ORAL_TABLET | Freq: Three times a day (TID) | ORAL | Status: AC
Start: 2023-07-16 — End: ?

## 2023-07-16 MED ORDER — AMLODIPINE BESYLATE 10 MG PO TABS: 10.0000 mg | ORAL_TABLET | Freq: Every day | ORAL | 3 refills | Status: DC

## 2023-07-16 NOTE — Patient Instructions (Signed)
 Medication Instructions:  START AMLODIPINE 10 MG DAILY  *If you need a refill on your cardiac medications before your next appointment, please call your pharmacy*  Lab Work: NO LABS If you have labs (blood work) drawn today and your tests are completely normal, you will receive your results only by: MyChart Message (if you have MyChart) OR A paper copy in the mail If you have any lab test that is abnormal or we need to change your treatment, we will call you to review the results.  Testing/Procedures: NO TESTING  Follow-Up: At [facility], you and your health needs are our priority.  As part of our continuing mission to provide you with exceptional heart care, our providers are all part of one team.  This team includes your primary Cardiologist (physician) and Advanced Practice Providers or APPs (Physician Assistants and Nurse Practitioners) who all work together to provide you with the care you need, when you need it.  Your next appointment:   FOLLOW UP TOMORROW AT 11:40 AM   Provider:   Thurmon Fair, MD   We recommend signing up for the patient portal called "MyChart".  Sign up information is provided on this After Visit Summary.  MyChart is used to connect with patients for Virtual Visits (Telemedicine).  Patients are able to view lab/test results, encounter notes, upcoming appointments, etc.  Non-urgent messages can be sent to your provider as well.   To learn more about what you can do with MyChart, go to [email or web address].   Other Instructions IF YOU EXPERIENCE ANY SYMPTOMS GO TO EMERGENCY ROOM       1st Floor: - Lobby - Registration  - Pharmacy  - Lab - Cafe  2nd Floor: - PV Lab - Diagnostic Testing (echo, CT, nuclear med)  3rd Floor: - Vacant  4th Floor: - TCTS (cardiothoracic surgery) - AFib Clinic - Structural Heart Clinic - Vascular Surgery  - Vascular Ultrasound  5th Floor: - HeartCare Cardiology (general and EP) - Clinical Pharmacy  for coumadin, hypertension, lipid, weight-loss medications, and med management appointments    Valet parking services will be available as well.

## 2023-07-17 ENCOUNTER — Ambulatory Visit: Attending: Cardiovascular Disease | Admitting: Cardiovascular Disease

## 2023-07-17 ENCOUNTER — Encounter: Payer: Self-pay | Admitting: Cardiovascular Disease

## 2023-07-17 VITALS — BP 140/68 | HR 53 | Ht 71.0 in | Wt 175.2 lb

## 2023-07-17 DIAGNOSIS — I5032 Chronic diastolic (congestive) heart failure: Secondary | ICD-10-CM

## 2023-07-17 DIAGNOSIS — E785 Hyperlipidemia, unspecified: Secondary | ICD-10-CM | POA: Diagnosis not present

## 2023-07-17 DIAGNOSIS — I2510 Atherosclerotic heart disease of native coronary artery without angina pectoris: Secondary | ICD-10-CM

## 2023-07-17 DIAGNOSIS — I10 Essential (primary) hypertension: Secondary | ICD-10-CM

## 2023-07-17 DIAGNOSIS — N1831 Chronic kidney disease, stage 3a: Secondary | ICD-10-CM

## 2023-07-17 DIAGNOSIS — E1042 Type 1 diabetes mellitus with diabetic polyneuropathy: Secondary | ICD-10-CM

## 2023-07-17 NOTE — Patient Instructions (Addendum)
 Medication Instructions:  No changes *If you need a refill on your cardiac medications before your next appointment, please call your pharmacy*  Follow-Up: At [facility] you and your health needs are our priority.  As part of our continuing mission to provide you with exceptional heart care, our providers are all part of one team.  This team includes your primary Cardiologist (physician) and Advanced Practice Providers or APPs (Physician Assistants and Nurse Practitioners) who all work together to provide you with the care you need, when you need it.  Your next appointment:   6 months with Marjie Skiff, PA-C  1 yr with Dr Royann Shivers  We recommend signing up for the patient portal called "MyChart".  Sign up information is provided on this After Visit Summary.  MyChart is used to connect with patients for Virtual Visits (Telemedicine).  Patients are able to view lab/test results, encounter notes, upcoming appointments, etc.  Non-urgent messages can be sent to your provider as well.   To learn more about what you can do with MyChart, go to [email or web address].        1st Floor: - Lobby - Registration  - Pharmacy  - Lab - Cafe  2nd Floor: - PV Lab - Diagnostic Testing (echo, CT, nuclear med)  3rd Floor: - Vacant  4th Floor: - TCTS (cardiothoracic surgery) - AFib Clinic - Structural Heart Clinic - Vascular Surgery  - Vascular Ultrasound  5th Floor: - HeartCare Cardiology (general and EP) - Clinical Pharmacy for coumadin, hypertension, lipid, weight-loss medications, and med management appointments    Valet parking services will be available as well.

## 2023-07-18 ENCOUNTER — Other Ambulatory Visit: Payer: Self-pay | Admitting: Physician Assistant

## 2023-07-21 NOTE — Progress Notes (Signed)
 Cardiology Office Note:    Date:  07/21/2023   ID:  Jesse Lane, DOB 1955-08-16, MRN [id number]  PCP:  Jesse Ivan, MD   [facility] Providers Cardiologist:  Jesse Rumple, MD     Referring MD: Jesse Ivan, MD   Chief Complaint  Patient presents with   Hypertension    History of Present Illness:    TEEGAN BRANDIS is a 68 y.o. male with a hx of chronic diastolic heart failure and asymptomatic single-vessel coronary artery disease, severe hypertension, DM type 1 on insulin, dyslipidemia (low HDL), CKD3, melanoma and MGUS presenting for recheck after being seen in the office yesterday and having extremely elevated blood pressure at approximately 220/100 mmHg.  He was not particularly symptomatic so we adjusted his medications and have him come back for an appointment today.  He is feeling better today and his blood pressure is substantially improved at 140/68.  When I asked him about his medication he seems to think that he had been out of his Nebivolol, although later he was not so sure.  We talked about the risk of beta-blocker rebound if skipping this medication.  In 2023 he was suffering multiple hospitalizations due to heart failure, but he has not been hospitalized for heart problems in the last 18 months.  He did have an emergency room visit in November for hypoglycemia and another visit in December for uncontrolled hyperglycemia, metabolic acidosis, hypovolemia and acute kidney injury.  We have previously estimated his "dry weight" to be 180 pounds.  Today he weighs 175 pounds.  He denies a headache, chest pain at rest or with activity, orthopnea, PND, exertional dyspnea, syncope or palpitations.  He has not had lower extremity edema.  He has difficulty with severe peripheral neuropathy which affects his balance, but he has not had falls.  He does not have other focal neurological complaints  Coronary CT angiography showed moderate atherosclerosis  including a moderate proximal LAD stenosis of 50 to 69%.  His most recent lipid profile showed LDL 42, HDL 43, triglycerides 156, all of which are substantially improved compared to historical values.  Hemoglobin A1c is also excellent at 5.8%.    Past Medical History:  Diagnosis Date   Allergy    Arthritis    Cataract    CHF (congestive heart failure) (HCC)    Diabetes mellitus type 2, uncontrolled    Diabetic neuropathy (HCC)    GERD (gastroesophageal reflux disease)    History of stomach ulcers    Hyperlipidemia    Hypertension    Melanoma (HCC)    mets to lymph node and intestines    Past Surgical History:  Procedure Laterality Date   ABCESS DRAINAGE     gluteal area   COLONOSCOPY     INGUINAL HERNIA REPAIR N/A 08/15/2015   Procedure: HERNIA REPAIR INGUINAL INCARCERATED;  Surgeon: Juanita Norlander, MD;  Location: [facility];  Service: General;  Laterality: N/A;  with MESH   pyloric stenosis     TONSILLECTOMY      Current Medications: Current Meds  Medication Sig   acetaminophen (TYLENOL) 500 MG tablet Take 1,000 mg by mouth every 6 (six) hours as needed for mild pain.   amLODipine (NORVASC) 10 MG tablet Take 1 tablet (10 mg total) by mouth daily.   calcium carbonate (OS-CAL - DOSED IN MG OF ELEMENTAL CALCIUM) 1250 (500 Ca) MG tablet Take 1 tablet (1,250 mg total) by mouth 2 (two) times daily with a meal.  divalproex (DEPAKOTE ER) 250 MG 24 hr tablet Take 250 mg by mouth daily.   famotidine (PEPCID) 40 MG tablet Take 40 mg by mouth daily.   feeding supplement (ENSURE ENLIVE / ENSURE PLUS) LIQD Take 237 mLs by mouth 2 (two) times daily between meals.   hydrALAZINE (APRESOLINE) 50 MG tablet Take 2 tablets (100 mg total) by mouth 3 (three) times daily.   insulin degludec (TRESIBA FLEXTOUCH) 100 UNIT/ML FlexTouch Pen Inject 20 Units into the skin daily before breakfast.   insulin lispro (HUMALOG) 100 UNIT/ML KwikPen Inject 5 Units into the skin 3 (three) times daily. Per sliding scale    Insulin Pen Needle (BD PEN NEEDLE MICRO U/F) 32G X 6 MM MISC 1 each by Other route See admin instructions. Use one pen needle to inject insulin 4 times daily. **must have appointment for additional refills.**   Insulin Pen Needle (BD PEN NEEDLE MICRO U/F) 32G X 6 MM MISC USE 1 PEN NEEDLE SIX TIMES DAILY (Patient taking differently: 1 each by Other route See admin instructions. USE 1 PEN NEEDLE SIX TIMES DAILY)   Iron-Folic Acid-Vit B12 (IRON FORMULA PO) Take by mouth daily.   KERENDIA 10 MG TABS Take 1 tablet by mouth daily.   MELATONIN PO Take 30 mg by mouth at bedtime.   montelukast (SINGULAIR) 10 MG tablet Take 10 mg by mouth at bedtime.   Multiple Vitamin (MULTIVITAMIN WITH MINERALS) TABS tablet Take 1 tablet by mouth daily.   nebivolol (BYSTOLIC) 10 MG tablet Take 1 tablet (10 mg total) by mouth daily.   ondansetron (ZOFRAN-ODT) 4 MG disintegrating tablet Take 1-2 tablets (4-8 mg total) by mouth every 6 (six) hours as needed for nausea.   pantoprazole (PROTONIX) 40 MG tablet Take 1 tablet by mouth twice daily   potassium chloride SA (KLOR-CON M) 20 MEQ tablet 20 mEq daily (Patient taking differently: Take 20 mEq by mouth daily. 20 mEq daily)   prazosin (MINIPRESS) 1 MG capsule Take 1 mg by mouth at bedtime.   rosuvastatin (CRESTOR) 20 MG tablet Take 1 tablet by mouth once daily   sertraline (ZOLOFT) 50 MG tablet Take 50 mg by mouth 2 (two) times daily.   traZODone (DESYREL) 100 MG tablet Take 100 mg by mouth at bedtime.   valsartan (DIOVAN) 320 MG tablet Take 320 mg by mouth daily.   [DISCONTINUED] loperamide (IMODIUM) 2 MG capsule Take 1 capsule by mouth once daily     Allergies:   Contrast media [iodinated contrast media]     Family History: The patient's family history includes Bone cancer in his mother; Breast cancer in his mother; Diabetes in his paternal aunt; Melanoma in his mother; Pancreatic cancer in his maternal grandfather; Prostate cancer in his father. There is no history  of Colon cancer, Colon polyps, Esophageal cancer, Stomach cancer, or Rectal cancer.   EKGs/Labs/Other Studies Reviewed:    The following studies were reviewed today: Coronary CT angiogram 05/19/2021 1. Mild to moderate, probably mixed calcific and non-calcific CAD, probably non-obstructive, CADRADS = 3. CT FFR will be performed and reported separately. 2. Coronary calcium score of 543. This was 82nd percentile for age and sex matched control. 3. Normal coronary origin with right dominance. 4. Dilated main pulmonary artery at 32 mm, suggestive of pulmonary hypertension 5. Aortic atherosclerosis.  1. Left Main:  No significant stenosis. FFR = 1.00 2. LAD: No significant stenosis. Proximal FFR = 0.94, Mid FFR = 0.82, Distal FFR = 0.69 3. LCX: No significant stenosis. Proximal FFR =  0.99, Distal FFR = 0.89 4. RCA: Significant stenosis. Proximal FFR = 0.99, Mid FFR = 0.77, Distal FFR = 0.75   IMPRESSION: 1. CT FFR demonstrates significant discrete stenosis of the mid RCA which is flow-limiting.  EKG: Personally reviewed the tracing from 04/17/2023 which shows normal sinus rhythm with a fairly long first-degree AV block at 280 ms and isolated voltage criteria for LVH, without repolarization abnormalities  EKG Interpretation Date/Time:    Ventricular Rate:    PR Interval:    QRS Duration:    QT Interval:    QTC Calculation:   R Axis:      Text Interpretation:           Recent Labs: 03/21/2023: B Natriuretic Peptide 316.3 03/23/2023: Magnesium 2.2 04/01/2023: ALT 14; BUN 28; Creatinine 2.47; Hemoglobin 11.6; Platelet Count 153; Potassium 3.8; Sodium 139  Recent Lipid Panel    Component Value Date/Time   CHOL 111 08/29/2022 1009   TRIG 156 (H) 08/29/2022 1009   HDL 43 08/29/2022 1009   CHOLHDL 2.6 08/29/2022 1009   CHOLHDL 6.5 10/17/2019 0255   VLDL 37 10/17/2019 0255   LDLCALC 42 08/29/2022 1009     Risk Assessment/Calculations:           Physical Exam:     VS:  BP (!) 140/68 (BP Location: Left Arm, Patient Position: Sitting)   Pulse (!) 53   Ht 5\' 11"  (1.803 m)   Wt 175 lb 3.2 oz (79.5 kg)   SpO2 96%   BMI 24.44 kg/m     Wt Readings from Last 3 Encounters:  07/17/23 175 lb 3.2 oz (79.5 kg)  07/16/23 173 lb (78.5 kg)  06/10/23 170 lb (77.1 kg)      General: Alert, oriented x3, no distress, appears well. Head: no evidence of trauma, PERRL, EOMI, no exophtalmos or lid lag, no myxedema, no xanthelasma; normal ears, nose and oropharynx Neck: normal jugular venous pulsations and no hepatojugular reflux; brisk carotid pulses without delay and no carotid bruits Chest: clear to auscultation, no signs of consolidation by percussion or palpation, normal fremitus, symmetrical and full respiratory excursions Cardiovascular: normal position and quality of the apical impulse, regular rhythm, normal first and second heart sounds, no murmurs, rubs or gallops Abdomen: no tenderness or distention, no masses by palpation, no abnormal pulsatility or arterial bruits, normal bowel sounds, no hepatosplenomegaly Extremities: no clubbing, cyanosis or edema Neurological: grossly nonfocal Psych: Normal mood and affect   ASSESSMENT:    1. Essential hypertension   2. Chronic diastolic CHF (congestive heart failure) (HCC)   3. Coronary artery disease involving native coronary artery of native heart without angina pectoris   4. Dyslipidemia (high LDL; low HDL)   5. Stage 3a chronic kidney disease (CKD) (HCC)   6. Diabetic polyneuropathy associated with type 1 diabetes mellitus (HCC)    PLAN:    In order of problems listed above:  HTN: Severely elevated yesterday, I wonder whether this was beta-blocker rebound.  No evidence of renal artery stenosis by duplex ultrasound in March 2024.  He is on maximum doses of amlodipine and valsartan and is also taking hydralazine 3 times daily, Kerendia, Nebivolol 10 mg daily and a low-dose of prazosin 1 mg at bedtime  daily.  We spent a long time talking about the potential risk for a beta-blocker rebound with abrupt cessation of this medication.  Also discussed the risk of orthostatic hypotension with his multiple drugs including an alpha-blocker. CHF: Appears euvolemic, NYHA functional class I.  CAD: Asymptomatic moderate stenosis in the LAD.  The focus is on risk factor modification. HLP: All his lipid parameters are much better now that he has better glycemic control and he is consistently taking a statin. CKD 3: He is very prone to AKI (occurred during treatment with SGLT2 inhibitors and during episode dehydration due to diarrhea). Baseline creatinine which appears to be 1.5-1.6, corresponding to a GFR around 50. DM 1: Quite sensitive to insulin and developed metabolic acidosis on SGLT2 inhibitors.  He is a type I diabetic.  Note that he had undetectable C-peptide in 2020 at [facility].  Complicated by retinopathy, neuropathy and probably nephropathy.            Medication Adjustments/Labs and Tests Ordered: Current medicines are reviewed at length with the patient today.  Concerns regarding medicines are outlined above.  No orders of the defined types were placed in this encounter.  No orders of the defined types were placed in this encounter.   Patient Instructions  Medication Instructions:  No changes *If you need a refill on your cardiac medications before your next appointment, please call your pharmacy*  Follow-Up: At [facility], you and your health needs are our priority.  As part of our continuing mission to provide you with exceptional heart care, our providers are all part of one team.  This team includes your primary Cardiologist (physician) and Advanced Practice Providers or APPs (Physician Assistants and Nurse Practitioners) who all work together to provide you with the care you need, when you need it.  Your next appointment:   6 months with Jesse Goodrich, PA-C  1 yr  with Jesse Lane  We recommend signing up for the patient portal called "MyChart".  Sign up information is provided on this After Visit Summary.  MyChart is used to connect with patients for Virtual Visits (Telemedicine).  Patients are able to view lab/test results, encounter notes, upcoming appointments, etc.  Non-urgent messages can be sent to your provider as well.   To learn more about what you can do with MyChart, go to [email or web address].        1st Floor: - Lobby - Registration  - Pharmacy  - Lab - Cafe  2nd Floor: - PV Lab - Diagnostic Testing (echo, CT, nuclear med)  3rd Floor: - Vacant  4th Floor: - TCTS (cardiothoracic surgery) - AFib Clinic - Structural Heart Clinic - Vascular Surgery  - Vascular Ultrasound  5th Floor: - HeartCare Cardiology (general and EP) - Clinical Pharmacy for coumadin, hypertension, lipid, weight-loss medications, and med management appointments    Valet parking services will be available as well.      Signed, Jesse Rumple, MD  07/21/2023 6:10 PM     [facility]

## 2023-07-22 ENCOUNTER — Inpatient Hospital Stay: Payer: 59 | Admitting: Internal Medicine

## 2023-08-05 ENCOUNTER — Other Ambulatory Visit: Payer: Self-pay | Admitting: Physician Assistant

## 2023-08-05 NOTE — Telephone Encounter (Signed)
 I do not see Zofran  on his med list

## 2023-08-10 IMAGING — CT CT HEART MORP W/ CTA COR W/ SCORE W/ CA W/CM &/OR W/O CM
1 series · 3 of 3 positions shown, 4 images · IV contrast (omnipaque)
Comparison: None.
COMPARISON: None.

Addendum:
EXAM:
OVER-READ INTERPRETATION  CT CHEST

The following report is an over-read performed by radiologist Dr.
Yunada Mengko [[email or web address]] on 05/18/2021. This
over-read does not include interpretation of cardiac or coronary
anatomy or pathology. The coronary calcium score/coronary CTA
interpretation by the cardiologist is attached.
HISTORY: 65 yo male with heart failure, known or suspected, initial workup
Cardiac/Coronary CTA
TECHNIQUE: The patient was scanned on a Siemens Force scanner.
PROTOCOL: A 120 kV prospective scan was triggered in the descending thoracic
aorta at 111 HU's. Axial non-contrast 3 mm slices were carried out
through the heart. The data set was analyzed on a dedicated work
station and scored using the Agatson method. Gantry rotation speed
was 250 msecs and collimation was .6 mm. Beta blockade and 0.8 mg of
sl NTG was given. The 3D data set was reconstructed in 5% intervals
of the 35-75 % of the R-R cycle. Diastolic phases were analyzed on a
dedicated work station using MPR, MIP and VRT modes. The patient
received 95mL OMNIPAQUE IOHEXOL 350 MG/ML SOLN of contrast.

[Series 1954: coronaries · 3 of 3 slices shown, 4 images]
[im 1/3  vessel]
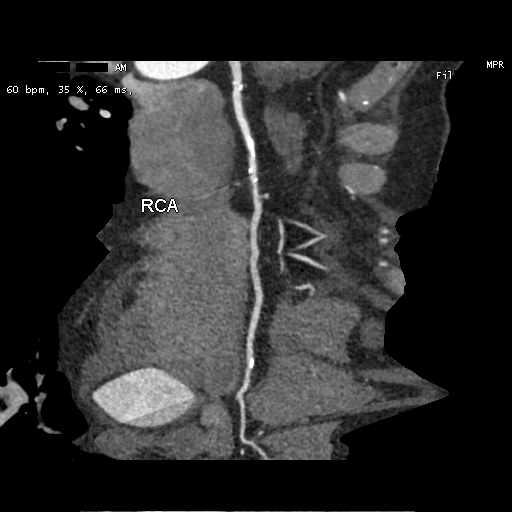
[im 1/3  lung]
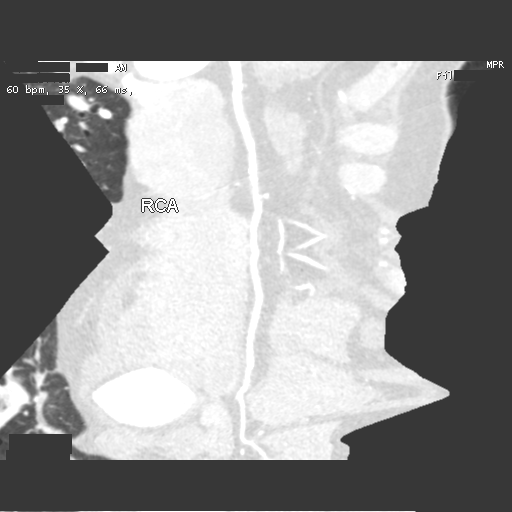
[im 2/3  vessel]
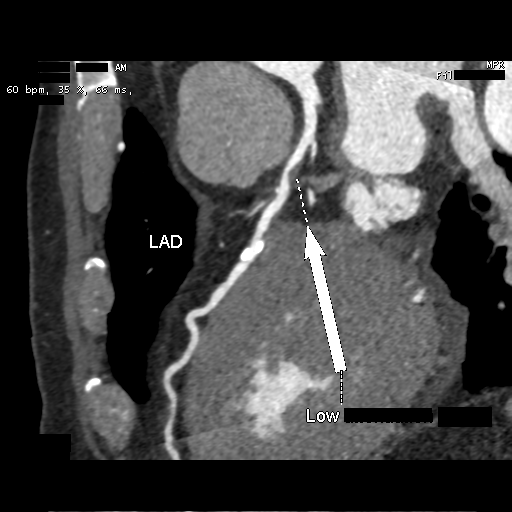
[im 3/3  vessel]
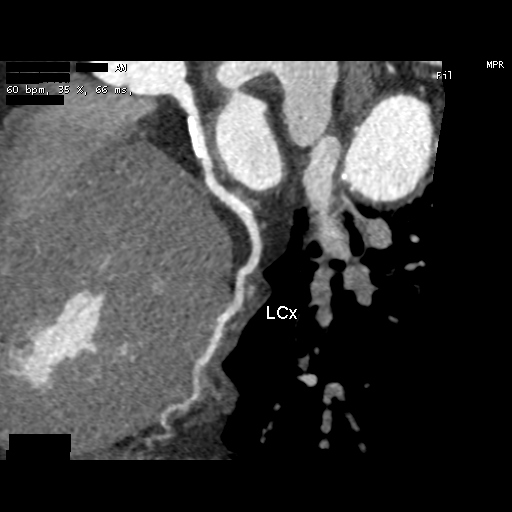

[3 of 3 positions shown; findings below may reference images not displayed]

FINDINGS: Atherosclerotic calcifications are noted in the thoracic aorta.
Within the visualized portions of the thorax there are no suspicious
appearing pulmonary nodules or masses, there is no acute
consolidative airspace disease, no pleural effusions, no
pneumothorax and no lymphadenopathy. Visualized portions of the
upper abdomen are unremarkable. There are no aggressive appearing
lytic or blastic lesions noted in the visualized portions of the
skeleton.
IMPRESSION: 1.  Aortic Atherosclerosis (N8OA7-[id number].U).
FINDINGS: Quality: Good, HR 60

Coronary calcium score: The patient's coronary artery calcium score
is 543, which places the patient in the 82nd percentile.

Coronary arteries: Normal coronary origins.  Right dominance.

Right Coronary Artery: Dominant. Mild mixed diffuse CAD (1-24%,
[id number]). Large acute marginal branch without disease.

Left Main Coronary Artery: Normal. Bifurcates into the LAD and LCx
arteries.

Left Anterior Descending Coronary Artery: Large anterior vessel
which reaches the apex. There is a non-calcified low attenuation
plaque of the proximal LAD with moderate stenosis 50-69% ([id number]).
There is mixed plaque of the mid-LAD which appears mildly stenotic,
25-49% ([id number]).

Left Circumflex Artery: AV groove vessel with long 13 mm proximal
segment of minimal mixed 1-24% stenosis ([id number]). Large mid-vessel
OM1 branch without stenosis.

Aorta: Normal size, 37 mm at the mid ascending aorta (level of the
PA bifurcation) measured double oblique. Aortic atherosclerosis. No
dissection.

Aortic Valve: Trileaflet. No calcifications.

Other findings:

Normal pulmonary vein drainage into the left atrium.

Normal left atrial appendage without a thrombus.

Dilated main pulmonary artery at 32 mm, suggestive of pulmonary
hypertension
IMPRESSION: 1. Mild to moderate, probably mixed calcific and non-calcific CAD,
probably non-obstructive, CADRADS = 3. CT FFR will be performed and
reported separately.

2. Coronary calcium score of 543. This was 82nd percentile for age
and sex matched control.

3. Normal coronary origin with right dominance.

4. Dilated main pulmonary artery at 32 mm, suggestive of pulmonary
hypertension

5. Aortic atherosclerosis.

*** End of Addendum ***
EXAM:
OVER-READ INTERPRETATION  CT CHEST

The following report is an over-read performed by radiologist Dr.
Yunada Mengko [[email or web address]] on 05/18/2021. This
over-read does not include interpretation of cardiac or coronary
anatomy or pathology. The coronary calcium score/coronary CTA
interpretation by the cardiologist is attached.
FINDINGS: Atherosclerotic calcifications are noted in the thoracic aorta.
Within the visualized portions of the thorax there are no suspicious
appearing pulmonary nodules or masses, there is no acute
consolidative airspace disease, no pleural effusions, no
pneumothorax and no lymphadenopathy. Visualized portions of the
upper abdomen are unremarkable. There are no aggressive appearing
lytic or blastic lesions noted in the visualized portions of the
skeleton.
IMPRESSION: 1.  Aortic Atherosclerosis (N8OA7-[id number].U).

## 2023-08-13 ENCOUNTER — Other Ambulatory Visit: Payer: Self-pay | Admitting: Physician Assistant

## 2023-09-10 ENCOUNTER — Other Ambulatory Visit: Payer: Self-pay | Admitting: Physician Assistant

## 2023-09-19 ENCOUNTER — Encounter: Payer: Self-pay | Admitting: Gastroenterology

## 2023-09-22 ENCOUNTER — Other Ambulatory Visit: Payer: Self-pay | Admitting: Cardiovascular Disease

## 2023-09-24 ENCOUNTER — Telehealth: Payer: Self-pay | Admitting: Cardiovascular Disease

## 2023-09-24 NOTE — Telephone Encounter (Signed)
 Spoke to patient he stated Dr.Croitoru increased Amlodipine  to 10 mg twice a day a few months ago.Stated he needs a refill.Advised medication list says Amlodipine  10 mg daily.Dr.Croitoru is out of office.I will send message to provider covering for him.

## 2023-09-24 NOTE — Telephone Encounter (Signed)
 Pt c/o medication issue:  1. Name of Medication: amLODipine  (NORVASC ) 10 MG tablet   2. How are you currently taking this medication (dosage and times per day)? As written   3. Are you having a reaction (difficulty breathing--STAT)? No   4. What is your medication issue? Pt called in stating Dr. Alvis Ba changed this med to take 2x daily and he has run out. Please advise.

## 2023-09-25 NOTE — Telephone Encounter (Signed)
 Called patient left message on personal voice mail to call back.

## 2023-10-02 NOTE — Telephone Encounter (Signed)
 Called patient left message on personal voice mail to call back.

## 2023-10-07 ENCOUNTER — Encounter: Payer: Self-pay | Admitting: Gastroenterology

## 2023-10-07 ENCOUNTER — Ambulatory Visit (AMBULATORY_SURGERY_CENTER)

## 2023-10-07 ENCOUNTER — Other Ambulatory Visit: Payer: Self-pay

## 2023-10-07 VITALS — Ht 71.0 in | Wt 172.0 lb

## 2023-10-07 DIAGNOSIS — Z860100 Personal history of colon polyps, unspecified: Secondary | ICD-10-CM

## 2023-10-07 MED ORDER — PEG 3350-KCL-NA BICARB-NACL 420 G PO SOLR
4000.0000 mL | Freq: Once | ORAL | 0 refills | Status: AC
Start: 2023-10-07 — End: 2023-10-07

## 2023-10-07 NOTE — Telephone Encounter (Signed)
 Patient never returned call

## 2023-10-07 NOTE — Progress Notes (Signed)
 Denies allergies to eggs or soy products. Denies complication of anesthesia or sedation. Denies use of weight loss medication. Denies use of O2.   Emmi instructions given for colonoscopy.

## 2023-10-22 ENCOUNTER — Encounter: Admitting: Gastroenterology

## 2023-10-23 MED ORDER — AMLODIPINE BESYLATE 10 MG PO TABS: 10.0000 mg | ORAL_TABLET | Freq: Every day | ORAL | 3 refills | Status: AC

## 2023-10-23 NOTE — Telephone Encounter (Signed)
 Returned call to patient.  Adv that per Dr. Mona, prescription for amlodipine  should be refilled for 10 mg daily.  Pt thought that it was twice daily but voices understanding and agreement with this.  Prescription refill sent.

## 2023-10-23 NOTE — Addendum Note (Signed)
 Addended by: Maddelyn Rocca on: 10/23/2023 04:30 PM   Modules accepted: Orders

## 2023-10-23 NOTE — Telephone Encounter (Signed)
 Pt requesting a c/b in regards to original phone note

## 2023-11-08 ENCOUNTER — Other Ambulatory Visit: Payer: Self-pay | Admitting: Student

## 2023-11-08 DIAGNOSIS — E785 Hyperlipidemia, unspecified: Secondary | ICD-10-CM

## 2023-11-12 NOTE — Telephone Encounter (Signed)
 Jesse Lane is calling to report that he showed up for his biopsy this morning and discovered it was yesterday. Give him a call to figure out how to reschedule and tell me if I should cancel Friday.

## 2023-11-20 ENCOUNTER — Telehealth: Payer: Self-pay | Admitting: Gastroenterology

## 2023-11-20 ENCOUNTER — Encounter: Admitting: Gastroenterology

## 2023-11-20 NOTE — Telephone Encounter (Signed)
 Reviewed with RN Debruler. Patient has been rescheduled. No late cancellation fee today. If he cancels again or No Shows again, he will be charged late cancellation fee. GM

## 2023-11-20 NOTE — Telephone Encounter (Signed)
 Good Afternoon Dr. Wilhelmenia,  I called this patient at 1:15 pm to see if he was coming for his procedure he stated that he did not receive his paperwork so he called and they sent them to him and he received them on Saturday.  So he will not be coming today.   Please advise if you would like me to NO SHOW PATIENT.   [employer]

## 2023-11-20 NOTE — Telephone Encounter (Signed)
 Called pt per MD request. Offered to reschedule procedures. Rescheduled for Tuesday 12/17/23 at 2:30. Pt states he has [employer]  prep at home. Will mail new instructions to pt, though he reports he has the instructions that were sent last week, if he does not receive the new instructions in the mail.

## 2023-11-20 NOTE — Telephone Encounter (Signed)
 Discussed with Jesse Lane today. She will attempt to callback and offer him 2 available EGD/Colonoscopy slots. If he accepts then will be scheduled. If he cannot do those dates, then clinic visit with APP or myself before rescheduling again. GM

## 2023-11-29 ENCOUNTER — Other Ambulatory Visit: Payer: Self-pay | Admitting: Physician Assistant

## 2023-12-03 MED ORDER — NEBIVOLOL HCL 10 MG PO TABS: 10.0000 mg | ORAL_TABLET | Freq: Every day | ORAL | 2 refills | Status: AC

## 2023-12-13 ENCOUNTER — Other Ambulatory Visit: Payer: Self-pay | Admitting: Physician Assistant

## 2023-12-15 ENCOUNTER — Other Ambulatory Visit: Payer: Self-pay | Admitting: Physician Assistant

## 2023-12-17 ENCOUNTER — Encounter: Admitting: Gastroenterology

## 2023-12-23 ENCOUNTER — Telehealth: Payer: Self-pay | Admitting: Physician Assistant

## 2023-12-23 MED ORDER — ONDANSETRON HCL 4 MG PO TABS: ORAL_TABLET | ORAL | 0 refills | Status: AC

## 2023-12-23 NOTE — Telephone Encounter (Signed)
 Script sent to pharmacy.

## 2023-12-23 NOTE — Telephone Encounter (Signed)
 PT is calling to get a refill for Zofran  it was denied. PT is not sure if our provider prescribed this but he was told to call us . Please advise.

## 2024-01-12 ENCOUNTER — Other Ambulatory Visit: Payer: Self-pay | Admitting: Physician Assistant

## 2024-01-14 NOTE — Telephone Encounter (Signed)
 Please sign

## 2024-01-26 ENCOUNTER — Other Ambulatory Visit: Payer: Self-pay | Admitting: Physician Assistant

## 2024-02-02 NOTE — Progress Notes (Unsigned)
 [facility] CONSULT NOTE  Patient Care Team: Burney Darice CROME, MD as PCP - General (Family Medicine) Francyne Headland, MD as PCP - Cardiology (Cardiology) Zulema Casino, MD as Consulting Physician (Unknown Physician Specialty)  ASSESSMENT & PLAN:  Jesse Lane is a 68 y.o.male with history of IDDM2, HTN, diabetic neuropathy, HLD being [facility] for ***.  Assessment & Plan   No orders of the defined types were placed in this encounter.   I personally spent a total of *** minutes in the care of the patient today including {Time Based Coding:[id number]}.   All questions were answered. The patient knows to call the clinic with any problems, questions or concerns. No barriers to learning was detected.  Jesse JAYSON Chihuahua, MD 10/26/20259:34 PM  CHIEF COMPLAINTS/PURPOSE OF CONSULTATION:  ***  HISTORY OF PRESENTING ILLNESS:  Jesse Lane 68 y.o. male is here because of *** I have reviewed his chart and materials related to his cancer extensively and collaborated history with the patient. Summary of oncologic history is as follows: Oncology History   No history exists.    MEDICAL HISTORY:  Past Medical History:  Diagnosis Date   Allergy    Arthritis    Cataract    CHF (congestive heart failure) (HCC)    Diabetes mellitus type 2, uncontrolled    Diabetic neuropathy (HCC)    GERD (gastroesophageal reflux disease)    History of stomach ulcers    Hyperlipidemia    Hypertension    Melanoma (HCC)    mets to lymph node and intestines   Neuromuscular disorder (HCC)    Osteoporosis     SURGICAL HISTORY: Past Surgical History:  Procedure Laterality Date   ABCESS DRAINAGE     gluteal area   COLONOSCOPY     INGUINAL HERNIA REPAIR N/A 08/15/2015   Procedure: HERNIA REPAIR INGUINAL INCARCERATED;  Surgeon: Alm Angle, MD;  Location: [facility];  Service: General;  Laterality: N/A;  with MESH   pyloric stenosis     TONSILLECTOMY      SOCIAL  HISTORY: Social History   Socioeconomic History   Marital status: Significant Other    Spouse name: Romero Mae- S/O   Number of children: 1   Years of education: Not on file   Highest education level: Some college, no degree  Occupational History   Occupation: retired  Tobacco Use   Smoking status: Former    Current packs/day: 0.00    Average packs/day: 1 pack/day for 8.0 years (8.0 ttl pk-yrs)    Types: Cigarettes    Start date: 41    Quit date: 1998    Years since quitting: 27.8   Smokeless tobacco: Never  Vaping Use   Vaping status: Never Used  Substance and Sexual Activity   Alcohol  use: Yes    Alcohol /week: 4.0 standard drinks of alcohol     Types: 4 Glasses of wine per week    Comment: occasionally   Drug use: Not Currently    Frequency: 7.0 times per week    Types: Marijuana    Comment: daily   Sexual activity: Not on file  Other Topics Concern   Not on file  Social History Narrative   Not on file   Social Drivers of Health   Financial Resource Strain: High Risk (05/04/2021)   Overall Financial Resource Strain (CARDIA)    Difficulty of Paying Living Expenses: Hard  Food Insecurity: No Food Insecurity (03/21/2023)   Hunger Vital Sign    Worried  About Running Out of Food in the Last Year: Never true    Ran Out of Food in the Last Year: Never true  Transportation Needs: No Transportation Needs (03/21/2023)   PRAPARE - Administrator, Civil Service (Medical): No    Lack of Transportation (Non-Medical): No  Physical Activity: Not on file  Stress: Not on file  Social Connections: Not on file  Intimate Partner Violence: Not At Risk (03/21/2023)   Humiliation, Afraid, Rape, and Kick questionnaire    Fear of Current or Ex-Partner: No    Emotionally Abused: No    Physically Abused: No    Sexually Abused: No    FAMILY HISTORY: Family History  Problem Relation Age of Onset   Breast cancer Mother    Bone cancer Mother    Melanoma Mother     Prostate cancer Father    Diabetes Paternal Aunt    Colon cancer Maternal Grandfather    Pancreatic cancer Maternal Grandfather    Colon polyps Neg Hx    Esophageal cancer Neg Hx    Stomach cancer Neg Hx    Rectal cancer Neg Hx     ALLERGIES:  is allergic to contrast media [iodinated contrast media].  MEDICATIONS:  Current Outpatient Medications  Medication Sig Dispense Refill   acetaminophen  (TYLENOL ) 500 MG tablet Take 1,000 mg by mouth every 6 (six) hours as needed for mild pain.     amLODipine  (NORVASC ) 10 MG tablet Take 1 tablet (10 mg total) by mouth daily. 90 tablet 3   calcium  carbonate (OS-CAL - DOSED IN MG OF ELEMENTAL CALCIUM ) 1250 (500 Ca) MG tablet Take 1 tablet (1,250 mg total) by mouth 2 (two) times daily with a meal. 60 tablet 1   cyclobenzaprine (FLEXERIL) 10 MG tablet Take 10 mg by mouth at bedtime as needed. (Patient not taking: Reported on 10/07/2023)     divalproex (DEPAKOTE ER) 250 MG 24 hr tablet Take 250 mg by mouth daily.     famotidine  (PEPCID ) 40 MG tablet TAKE 1 TABLET BY MOUTH AT BEDTIME 30 tablet 0   feeding supplement (ENSURE ENLIVE / ENSURE PLUS) LIQD Take 237 mLs by mouth 2 (two) times daily between meals. 237 mL 12   hydrALAZINE  (APRESOLINE ) 50 MG tablet Take 2 tablets (100 mg total) by mouth 3 (three) times daily.     insulin  degludec (TRESIBA  FLEXTOUCH) 100 UNIT/ML FlexTouch Pen Inject 20 Units into the skin daily before breakfast.     insulin  lispro (HUMALOG) 100 UNIT/ML KwikPen Inject 5 Units into the skin 3 (three) times daily. Per sliding scale     Insulin  Pen Needle (BD PEN NEEDLE MICRO U/F) 32G X 6 MM MISC 1 each by Other route See admin instructions. Use one pen needle to inject insulin  4 times daily. **must have appointment for additional refills.** 150 each 0   Insulin  Pen Needle (BD PEN NEEDLE MICRO U/F) 32G X 6 MM MISC USE 1 PEN NEEDLE SIX TIMES DAILY 200 each 2   Iron-Folic Acid -Vit B12 (IRON FORMULA PO) Take by mouth daily.     KERENDIA 10  MG TABS Take 1 tablet by mouth daily.     loperamide  (IMODIUM ) 2 MG capsule Take 1 capsule by mouth once daily 30 capsule 0   MELATONIN PO Take 30 mg by mouth at bedtime.     montelukast  (SINGULAIR ) 10 MG tablet Take 10 mg by mouth at bedtime.     Multiple Vitamin (MULTIVITAMIN WITH MINERALS) TABS tablet Take 1 tablet  by mouth daily.     nebivolol  (BYSTOLIC ) 10 MG tablet Take 1 tablet (10 mg total) by mouth daily. 90 tablet 2   ondansetron  (ZOFRAN ) 4 MG tablet TAKE 1 TABLET BY MOUTH EVERY 4 TO 6 HOURS AS NEEDED FOR NAUSEA 90 tablet 0   ondansetron  (ZOFRAN -ODT) 4 MG disintegrating tablet Take 1-2 tablets (4-8 mg total) by mouth every 6 (six) hours as needed for nausea. 30 tablet 5   pantoprazole  (PROTONIX ) 40 MG tablet Take 1 tablet by mouth twice daily 60 tablet 0   potassium chloride  SA (KLOR-CON  M) 20 MEQ tablet 20 mEq daily (Patient taking differently: Take 20 mEq by mouth daily. 20 mEq daily) 36 tablet 0   prazosin  (MINIPRESS ) 1 MG capsule Take 1 mg by mouth at bedtime.     rosuvastatin  (CRESTOR ) 20 MG tablet Take 1 tablet by mouth once daily 90 tablet 2   sertraline  (ZOLOFT ) 50 MG tablet Take 50 mg by mouth 2 (two) times daily.     traZODone  (DESYREL ) 100 MG tablet Take 100 mg by mouth at bedtime.  0   valsartan  (DIOVAN ) 320 MG tablet Take 1 tablet by mouth once daily 90 tablet 3   No current facility-administered medications for this visit.    REVIEW OF SYSTEMS:   All relevant systems were reviewed with the patient and are negative.  PHYSICAL EXAMINATION: ECOG PERFORMANCE STATUS: {CHL ONC ECOG [phone]}  There were no vitals filed for this visit. There were no vitals filed for this visit.  GENERAL: alert, no distress and comfortable SKIN: skin color is normal, no jaundice, rashes or significant lesions EYES: sclera clear OROPHARYNX: no exudate, no erythema NECK: supple LYMPH:  no palpable lymphadenopathy in the cervical, axillary regions LUNGS: Effort normal, no  respiratory distress.  Clear to auscultation bilaterally HEART: regular rate & rhythm and no lower extremity edema ABDOMEN: soft, non-tender and nondistended Musculoskeletal: no edema NEURO: no focal motor/sensory deficits  LABORATORY DATA:  I have reviewed the data as listed Lab Results  Component Value Date   WBC 3.6 (L) 04/01/2023   HGB 11.6 (L) 04/01/2023   HCT 33.6 (L) 04/01/2023   MCV 85.1 04/01/2023   PLT 153 04/01/2023   Recent Labs    03/22/23 0553 03/23/23 0726 04/01/23 0907  NA 136 132* 139  K 2.7* 3.2* 3.8  CL 109 110 108  CO2 17* 16* 24  GLUCOSE 149* 190* 141*  BUN 31* 31* 28*  CREATININE 3.50* 3.55* 2.47*  CALCIUM  6.2* 6.8* 6.2*  GFRNONAA 18* 18* 28*  PROT 5.6* 5.7* 6.2*  ALBUMIN  2.8* 2.8* 3.5  AST 23 18 18   ALT 15 13 14   ALKPHOS 47 50 58  BILITOT 1.1 1.0 0.5    RADIOGRAPHIC STUDIES: I have personally reviewed the radiological images as listed and agreed with the findings in the report. No results found.

## 2024-02-03 ENCOUNTER — Inpatient Hospital Stay

## 2024-02-03 VITALS — BP 191/89 | HR 57 | Temp 97.7°F | Resp 20 | Wt 173.0 lb

## 2024-02-03 DIAGNOSIS — D649 Anemia, unspecified: Secondary | ICD-10-CM | POA: Diagnosis not present

## 2024-02-03 DIAGNOSIS — Z08 Encounter for follow-up examination after completed treatment for malignant neoplasm: Secondary | ICD-10-CM | POA: Insufficient documentation

## 2024-02-03 DIAGNOSIS — D72822 Plasmacytosis: Secondary | ICD-10-CM | POA: Insufficient documentation

## 2024-02-03 DIAGNOSIS — R110 Nausea: Secondary | ICD-10-CM

## 2024-02-03 DIAGNOSIS — Z85820 Personal history of malignant melanoma of skin: Secondary | ICD-10-CM | POA: Insufficient documentation

## 2024-02-03 DIAGNOSIS — M810 Age-related osteoporosis without current pathological fracture: Secondary | ICD-10-CM | POA: Insufficient documentation

## 2024-02-03 DIAGNOSIS — C4359 Malignant melanoma of other part of trunk: Secondary | ICD-10-CM | POA: Diagnosis not present

## 2024-02-03 LAB — CBC WITH DIFFERENTIAL (CANCER CENTER ONLY)
Abs Immature Granulocytes: 0.01 K/uL (ref 0.00–0.07)
Basophils Absolute: 0 K/uL (ref 0.0–0.1)
Basophils Relative: 0 %
Eosinophils Absolute: 0 K/uL (ref 0.0–0.5)
Eosinophils Relative: 1 %
HCT: 29.5 % — ABNORMAL LOW (ref 39.0–52.0)
Hemoglobin: 10.4 g/dL — ABNORMAL LOW (ref 13.0–17.0)
Immature Granulocytes: 0 %
Lymphocytes Relative: 20 %
Lymphs Abs: 1 K/uL (ref 0.7–4.0)
MCH: 30.1 pg (ref 26.0–34.0)
MCHC: 35.3 g/dL (ref 30.0–36.0)
MCV: 85.5 fL (ref 80.0–100.0)
Monocytes Absolute: 0.3 K/uL (ref 0.1–1.0)
Monocytes Relative: 6 %
Neutro Abs: 3.8 K/uL (ref 1.7–7.7)
Neutrophils Relative %: 73 %
Platelet Count: 143 K/uL — ABNORMAL LOW (ref 150–400)
RBC: 3.45 MIL/uL — ABNORMAL LOW (ref 4.22–5.81)
RDW: 13.1 % (ref 11.5–15.5)
WBC Count: 5.2 K/uL (ref 4.0–10.5)
nRBC: 0 % (ref 0.0–0.2)

## 2024-02-03 LAB — CMP (CANCER CENTER ONLY)
ALT: 9 U/L (ref 0–44)
AST: 17 U/L (ref 15–41)
Albumin: 3.6 g/dL (ref 3.5–5.0)
Alkaline Phosphatase: 46 U/L (ref 38–126)
Anion gap: 8 (ref 5–15)
BUN: 33 mg/dL — ABNORMAL HIGH (ref 8–23)
CO2: 19 mmol/L — ABNORMAL LOW (ref 22–32)
Calcium: 8.2 mg/dL — ABNORMAL LOW (ref 8.9–10.3)
Chloride: 109 mmol/L (ref 98–111)
Creatinine: 2.45 mg/dL — ABNORMAL HIGH (ref 0.61–1.24)
GFR, Estimated: 28 mL/min — ABNORMAL LOW (ref >60)
Glucose, Bld: 186 mg/dL — ABNORMAL HIGH (ref 70–99)
Potassium: 4.3 mmol/L (ref 3.5–5.1)
Sodium: 136 mmol/L (ref 135–145)
Total Bilirubin: 0.6 mg/dL (ref 0.0–1.2)
Total Protein: 6.4 g/dL — ABNORMAL LOW (ref 6.5–8.1)

## 2024-02-03 LAB — IRON AND IRON BINDING CAPACITY (CC-WL,HP ONLY)
Iron: 81 ug/dL (ref 45–182)
Saturation Ratios: 30 % (ref 17.9–39.5)
TIBC: 270 ug/dL (ref 250–450)
UIBC: 189 ug/dL (ref 117–376)

## 2024-02-03 LAB — LACTATE DEHYDROGENASE: LDH: 163 U/L (ref 98–192)

## 2024-02-03 LAB — VITAMIN B12: Vitamin B-12: 557 pg/mL (ref 180–914)

## 2024-02-03 LAB — FOLATE: Folate: 15.2 ng/mL (ref >5.9)

## 2024-02-03 LAB — FERRITIN: Ferritin: 102 ng/mL (ref 24–336)

## 2024-02-03 MED ORDER — ONDANSETRON 4 MG PO TBDP: 4.0000 mg | ORAL_TABLET | Freq: Four times a day (QID) | ORAL | 5 refills | Status: AC | PRN

## 2024-02-03 NOTE — Assessment & Plan Note (Addendum)
 In the setting of multiple chronic disease.  Patient had normal bone marrow biopsy with normocellular marrow [facility] this year. Repeat ferritin, iron panel, B12 and folate today

## 2024-02-03 NOTE — Assessment & Plan Note (Addendum)
 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  Will obtain MRI of the brain as indicated.

## 2024-02-04 ENCOUNTER — Ambulatory Visit: Payer: Self-pay

## 2024-02-04 ENCOUNTER — Other Ambulatory Visit: Payer: Self-pay

## 2024-02-04 DIAGNOSIS — L500 Allergic urticaria: Secondary | ICD-10-CM

## 2024-02-04 MED ORDER — DIPHENHYDRAMINE HCL 50 MG PO TABS: 50.0000 mg | ORAL_TABLET | ORAL | 0 refills | Status: AC

## 2024-02-04 MED ORDER — PREDNISONE 50 MG PO TABS: ORAL_TABLET | ORAL | 0 refills | Status: AC

## 2024-02-06 ENCOUNTER — Telehealth: Payer: Self-pay

## 2024-02-06 NOTE — Telephone Encounter (Signed)
 Spoke with the patient discussed the prep for CT for allergy assured him he is with out any contrast. Spoke with the CT department to confirm to assure they were aware of with no contrast. Arland Legions BSN RN

## 2024-02-15 ENCOUNTER — Other Ambulatory Visit: Payer: Self-pay | Admitting: Physician Assistant

## 2024-02-17 ENCOUNTER — Ambulatory Visit (HOSPITAL_COMMUNITY)

## 2024-02-17 NOTE — Telephone Encounter (Signed)
 Patient is requesting a refill for imodium  to be sent to pharmacy. Please advise, thank you

## 2024-02-21 ENCOUNTER — Ambulatory Visit

## 2024-02-21 ENCOUNTER — Telehealth: Payer: Self-pay

## 2024-02-21 VITALS — Ht 71.0 in | Wt 170.0 lb

## 2024-02-21 DIAGNOSIS — K2270 Barrett's esophagus without dysplasia: Secondary | ICD-10-CM

## 2024-02-21 DIAGNOSIS — Z860100 Personal history of colon polyps, unspecified: Secondary | ICD-10-CM

## 2024-02-21 NOTE — Progress Notes (Signed)
No egg or soy allergy known to patient  No issues known to pt with past sedation with any surgeries or procedures Patient denies ever being told they had issues or difficulty with intubation  No FH of Malignant Hyperthermia Pt is not on diet pills Pt is not on  home 02  Pt is not on blood thinners  Pt denies issues with constipation  No A fib or A flutter Have any cardiac testing pending--no Pt can ambulate with assistance Pt denies use of chewing tobacco Discussed diabetic I weight loss medication holds Discussed NSAID holds Checked BMI Pt instructed to use [employer] or GoodRx for a price reduction on prep  Patient's chart reviewed by Cathlyn Parsons CNRA prior to previsit and patient appropriate for the [facility].  Pre visit completed and red dot placed by patient's name on their procedure day (on provider's schedule).

## 2024-02-21 NOTE — Telephone Encounter (Signed)
 Left message for patient letting him know that the nurse tried to reach out at 1pm for his visit. Will try back in 5 min to try and complete visit

## 2024-02-24 ENCOUNTER — Ambulatory Visit (HOSPITAL_COMMUNITY)

## 2024-02-25 ENCOUNTER — Inpatient Hospital Stay

## 2024-02-26 ENCOUNTER — Telehealth: Payer: Self-pay

## 2024-02-26 NOTE — Telephone Encounter (Signed)
 Scheduled patient for scan review. Called and spoke with the patient, he is aware.

## 2024-03-01 ENCOUNTER — Other Ambulatory Visit: Payer: Self-pay | Admitting: Physician Assistant

## 2024-03-02 ENCOUNTER — Ambulatory Visit (HOSPITAL_COMMUNITY)

## 2024-03-06 ENCOUNTER — Telehealth: Payer: Self-pay | Admitting: Physician Assistant

## 2024-03-06 NOTE — Telephone Encounter (Signed)
 Telephone call  03/06/2024 9:52 AM  Patient called on-call service over the holiday weekend to let us  know that he had to cancel his colonoscopy procedure on 12/1 at 130.  Apparently his caregiver had a conflict.  When looking at his scheduled time though he is scheduled for 9:30 on 12/2.  I did call him back but he did not answer, left a message on his machine to let him know about this.  Told him to call us  back if the 12 /2 appointment did not work for him.  Delon Failing, PA-C

## 2024-03-07 NOTE — Telephone Encounter (Signed)
 Thank you for letting me know.  Patty, Please confirm that patient will not be coming on 12/2 (his scheduled procedure date). We now have had multiple reschedulings or cancellations. Before he is rescheduled he will need to be seen by APP in clinic. Thanks. GM

## 2024-03-09 NOTE — Telephone Encounter (Signed)
 Confirmed the pt wants to cancel his appt- he states that his girlfriend had a kidney replacement and  he has no time to complete. He will call back to make an office visit when he is ready.

## 2024-03-10 ENCOUNTER — Encounter: Admitting: Gastroenterology

## 2024-03-17 ENCOUNTER — Inpatient Hospital Stay

## 2024-03-25 ENCOUNTER — Other Ambulatory Visit: Payer: Self-pay | Admitting: Gastroenterology

## 2024-03-26 NOTE — Telephone Encounter (Signed)
 Spoke w pt about recent medication refill famotidine  (PEPCID ) 40 MG tablet. Stated that pharmacy didn't receive the refill. Please advise thank you.

## 2024-04-03 ENCOUNTER — Other Ambulatory Visit (HOSPITAL_COMMUNITY): Payer: Self-pay

## 2024-04-10 ENCOUNTER — Other Ambulatory Visit: Payer: Self-pay | Admitting: Physician Assistant

## 2024-04-17 ENCOUNTER — Observation Stay (HOSPITAL_COMMUNITY)
Admission: EM | Admit: 2024-04-17 | Discharge: 2024-04-18 | Attending: Emergency Medicine | Admitting: Emergency Medicine

## 2024-04-17 ENCOUNTER — Encounter (HOSPITAL_COMMUNITY): Payer: Self-pay | Admitting: Emergency Medicine

## 2024-04-17 ENCOUNTER — Other Ambulatory Visit: Payer: Self-pay

## 2024-04-17 ENCOUNTER — Emergency Department (HOSPITAL_COMMUNITY)

## 2024-04-17 DIAGNOSIS — I2510 Atherosclerotic heart disease of native coronary artery without angina pectoris: Secondary | ICD-10-CM | POA: Diagnosis present

## 2024-04-17 DIAGNOSIS — Z79899 Other long term (current) drug therapy: Secondary | ICD-10-CM | POA: Diagnosis not present

## 2024-04-17 DIAGNOSIS — K219 Gastro-esophageal reflux disease without esophagitis: Secondary | ICD-10-CM | POA: Diagnosis present

## 2024-04-17 DIAGNOSIS — I10 Essential (primary) hypertension: Secondary | ICD-10-CM | POA: Diagnosis present

## 2024-04-17 DIAGNOSIS — I5032 Chronic diastolic (congestive) heart failure: Secondary | ICD-10-CM | POA: Diagnosis present

## 2024-04-17 DIAGNOSIS — Z794 Long term (current) use of insulin: Secondary | ICD-10-CM | POA: Insufficient documentation

## 2024-04-17 DIAGNOSIS — E10649 Type 1 diabetes mellitus with hypoglycemia without coma: Principal | ICD-10-CM | POA: Insufficient documentation

## 2024-04-17 DIAGNOSIS — E1140 Type 2 diabetes mellitus with diabetic neuropathy, unspecified: Secondary | ICD-10-CM | POA: Diagnosis present

## 2024-04-17 DIAGNOSIS — I5030 Unspecified diastolic (congestive) heart failure: Secondary | ICD-10-CM | POA: Insufficient documentation

## 2024-04-17 DIAGNOSIS — Z8673 Personal history of transient ischemic attack (TIA), and cerebral infarction without residual deficits: Secondary | ICD-10-CM | POA: Diagnosis not present

## 2024-04-17 DIAGNOSIS — N1832 Chronic kidney disease, stage 3b: Secondary | ICD-10-CM | POA: Insufficient documentation

## 2024-04-17 DIAGNOSIS — E162 Hypoglycemia, unspecified: Secondary | ICD-10-CM | POA: Diagnosis present

## 2024-04-17 DIAGNOSIS — E1022 Type 1 diabetes mellitus with diabetic chronic kidney disease: Secondary | ICD-10-CM | POA: Insufficient documentation

## 2024-04-17 DIAGNOSIS — Z5329 Procedure and treatment not carried out because of patient's decision for other reasons: Secondary | ICD-10-CM | POA: Insufficient documentation

## 2024-04-17 DIAGNOSIS — T68XXXA Hypothermia, initial encounter: Secondary | ICD-10-CM

## 2024-04-17 DIAGNOSIS — K529 Noninfective gastroenteritis and colitis, unspecified: Secondary | ICD-10-CM | POA: Diagnosis present

## 2024-04-17 DIAGNOSIS — I130 Hypertensive heart and chronic kidney disease with heart failure and stage 1 through stage 4 chronic kidney disease, or unspecified chronic kidney disease: Secondary | ICD-10-CM | POA: Insufficient documentation

## 2024-04-17 DIAGNOSIS — E1343 Other specified diabetes mellitus with diabetic autonomic (poly)neuropathy: Secondary | ICD-10-CM | POA: Diagnosis present

## 2024-04-17 DIAGNOSIS — I160 Hypertensive urgency: Secondary | ICD-10-CM | POA: Diagnosis present

## 2024-04-17 DIAGNOSIS — K2270 Barrett's esophagus without dysplasia: Secondary | ICD-10-CM | POA: Diagnosis present

## 2024-04-17 LAB — URINALYSIS, ROUTINE W REFLEX MICROSCOPIC
Bacteria, UA: NONE SEEN
Bilirubin Urine: NEGATIVE
Glucose, UA: 50 mg/dL — AB
Hgb urine dipstick: NEGATIVE
Ketones, ur: NEGATIVE mg/dL
Leukocytes,Ua: NEGATIVE
Nitrite: NEGATIVE
Protein, ur: >=300 mg/dL — AB
Specific Gravity, Urine: 1.016 (ref 1.005–1.030)
pH: 5 (ref 5.0–8.0)

## 2024-04-17 LAB — CBC
HCT: 26.1 % — ABNORMAL LOW (ref 39.0–52.0)
Hemoglobin: 8.5 g/dL — ABNORMAL LOW (ref 13.0–17.0)
MCH: 29.5 pg (ref 26.0–34.0)
MCHC: 32.6 g/dL (ref 30.0–36.0)
MCV: 90.6 fL (ref 80.0–100.0)
Platelets: 186 K/uL (ref 150–400)
RBC: 2.88 MIL/uL — ABNORMAL LOW (ref 4.22–5.81)
RDW: 13.2 % (ref 11.5–15.5)
WBC: 3.5 K/uL — ABNORMAL LOW (ref 4.0–10.5)
nRBC: 0 % (ref 0.0–0.2)

## 2024-04-17 LAB — TROPONIN T, HIGH SENSITIVITY
Troponin T High Sensitivity: 73 ng/L — ABNORMAL HIGH (ref 0–19)
Troponin T High Sensitivity: 69 ng/L — ABNORMAL HIGH (ref 0–19)

## 2024-04-17 LAB — I-STAT CG4 LACTIC ACID, ED: Lactic Acid, Venous: 0.7 mmol/L (ref 0.5–1.9)

## 2024-04-17 LAB — TSH: TSH: 1.88 u[IU]/mL (ref 0.350–4.500)

## 2024-04-17 LAB — CBG MONITORING, ED
Glucose-Capillary: 50 mg/dL — ABNORMAL LOW (ref 70–99)
Glucose-Capillary: 81 mg/dL (ref 70–99)
Glucose-Capillary: 135 mg/dL — ABNORMAL HIGH (ref 70–99)
Glucose-Capillary: 124 mg/dL — ABNORMAL HIGH (ref 70–99)

## 2024-04-17 LAB — HEPATIC FUNCTION PANEL
ALT: 7 U/L (ref 0–44)
AST: 18 U/L (ref 15–41)
Albumin: 2.9 g/dL — ABNORMAL LOW (ref 3.5–5.0)
Alkaline Phosphatase: 50 U/L (ref 38–126)
Bilirubin, Direct: 0.2 mg/dL (ref 0.0–0.2)
Indirect Bilirubin: 0.2 mg/dL — ABNORMAL LOW (ref 0.3–0.9)
Total Bilirubin: 0.4 mg/dL (ref 0.0–1.2)
Total Protein: 5.7 g/dL — ABNORMAL LOW (ref 6.5–8.1)

## 2024-04-17 LAB — BASIC METABOLIC PANEL WITH GFR
Anion gap: 11 (ref 5–15)
BUN: 26 mg/dL — ABNORMAL HIGH (ref 8–23)
CO2: 21 mmol/L — ABNORMAL LOW (ref 22–32)
Calcium: 7.9 mg/dL — ABNORMAL LOW (ref 8.9–10.3)
Chloride: 109 mmol/L (ref 98–111)
Creatinine, Ser: 3.17 mg/dL — ABNORMAL HIGH (ref 0.61–1.24)
GFR, Estimated: 20 mL/min — ABNORMAL LOW
Glucose, Bld: 147 mg/dL — ABNORMAL HIGH (ref 70–99)
Potassium: 3.4 mmol/L — ABNORMAL LOW (ref 3.5–5.1)
Sodium: 140 mmol/L (ref 135–145)

## 2024-04-17 LAB — MAGNESIUM: Magnesium: 1.9 mg/dL (ref 1.7–2.4)

## 2024-04-17 LAB — PRO BRAIN NATRIURETIC PEPTIDE: Pro Brain Natriuretic Peptide: 10927 pg/mL — ABNORMAL HIGH

## 2024-04-17 MED ORDER — DEXTROSE 50 % IV SOLN
INTRAVENOUS | Status: AC
  Administered 2024-04-17: 50 mL via INTRAVENOUS
  Filled 2024-04-17: qty 50

## 2024-04-17 MED ORDER — SODIUM CHLORIDE 0.9 % IV SOLN: 250.0000 mL | INTRAVENOUS | Status: DC | PRN

## 2024-04-17 MED ORDER — DEXTROSE 50 % IV SOLN: 1.0000 | Freq: Once | INTRAVENOUS | Status: AC

## 2024-04-17 MED ORDER — DEXTROSE 50 % IV SOLN: 1.0000 | Freq: Once | INTRAVENOUS | Status: DC

## 2024-04-17 NOTE — ED Notes (Signed)
 Pt temp 92.8 rectally, Provider notified, bairhugger applied to pt.

## 2024-04-17 NOTE — ED Notes (Signed)
 Called lab to add on hepatic function panel and magnesium

## 2024-04-17 NOTE — ED Provider Notes (Signed)
 " [facility] AT [facility] Provider Note   CSN: [id number] Arrival date & time: 04/17/24  1521     Patient presents with: Hypoglycemia   Jesse Lane is a 69 y.o. male with past medical history of HTN, anemia of chronic disease, CVA, CKD stage IIIb, diastolic HF, insulin -dependent T1DM, MI, MGUS presents Emergency Department via EMS for evaluation of hypoxia, hypoglycemia.  Patient's wife reports that she was running errands this morning when she found patient at home around 1500 sleeping in bed.  She checked her sugar and noted it to be low so called EMS.  EMS provided 20 g of D10 prior to arrival for hypoglycemia as low as 58.  FD reports that O2 sats were 80% so provided NRB at 15L. EMS transitioned patient to [place] at 4L without hypoxia. Patient is currently maintaining sats of 97% with no supplementation on my interview. Denies cough, shob, fevers  Patient reports that he recently ran out of Dexcom sensors and is awaiting insurance for approval of supplies.  Today, he checked his sugar with a manual meter that read 412 so took approximately 10 units of insulin  at 1300. No complaints prior to today. Denies CP, NVD, abd pain      Hypoglycemia Associated symptoms: no shortness of breath        Prior to Admission medications  Medication Sig Start Date End Date Taking? Authorizing Provider  acetaminophen  (TYLENOL ) 500 MG tablet Take 1,000 mg by mouth every 6 (six) hours as needed for mild pain.    [provider]  amLODipine  (NORVASC ) 10 MG tablet Take 1 tablet (10 mg total) by mouth daily. 10/23/23   Croitoru, Mihai, MD  calcium  carbonate (OS-CAL - DOSED IN MG OF ELEMENTAL CALCIUM ) 1250 (500 Ca) MG tablet Take 1 tablet (1,250 mg total) by mouth 2 (two) times daily with a meal. 03/23/23   Pokhrel, Laxman, MD  diphenhydrAMINE  (BENADRYL ) 50 MG tablet Take 1 tablet (50 mg total) by mouth 1 day or 1 dose for 1 dose. Patient not taking: Reported on  02/21/2024 02/04/24 02/05/24  Tina Pauletta BROCKS, MD  divalproex (DEPAKOTE ER) 250 MG 24 hr tablet Take 250 mg by mouth daily. 03/13/23   [provider]  famotidine  (PEPCID ) 40 MG tablet TAKE 1 TABLET BY MOUTH AT BEDTIME 03/25/24   Beather Delon Gibson, PA  hydrALAZINE  (APRESOLINE ) 50 MG tablet Take 2 tablets (100 mg total) by mouth 3 (three) times daily. 07/16/23   Goodrich, Callie E, PA-C  insulin  degludec (TRESIBA  FLEXTOUCH) 100 UNIT/ML FlexTouch Pen Inject 20 Units into the skin daily before breakfast.    [provider]  insulin  lispro (HUMALOG) 100 UNIT/ML KwikPen Inject 5 Units into the skin 3 (three) times daily. Per sliding scale    [provider]  Insulin  Pen Needle (BD PEN NEEDLE MICRO U/F) 32G X 6 MM MISC 1 each by Other route See admin instructions. Use one pen needle to inject insulin  4 times daily. **must have appointment for additional refills.** 06/30/18   Kassie Mallick, MD  Insulin  Pen Needle (BD PEN NEEDLE MICRO U/F) 32G X 6 MM MISC USE 1 PEN NEEDLE SIX TIMES DAILY 07/02/18   Kassie Mallick, MD  Iron-Folic Acid -Vit B12 (IRON FORMULA PO) Take by mouth daily.    [provider]  KERENDIA 10 MG TABS Take 1 tablet by mouth daily. 07/09/23   [provider]  ketorolac  (ACULAR ) 0.5 % ophthalmic solution Apply 1 drop to eye.  [provider]  loperamide  (IMODIUM ) 2 MG capsule Take 1 capsule by mouth once daily 04/10/24   Beather Delon Gibson, PA  MELATONIN PO Take 30 mg by mouth at bedtime.    [provider]  montelukast  (SINGULAIR ) 10 MG tablet Take 10 mg by mouth at bedtime. 10/13/19   [provider]  Multiple Vitamin (MULTIVITAMIN WITH MINERALS) TABS tablet Take 1 tablet by mouth daily.    [provider]  nebivolol  (BYSTOLIC ) 10 MG tablet Take 1 tablet (10 mg total) by mouth daily. 12/03/23   Croitoru, Mihai, MD  ondansetron  (ZOFRAN ) 4 MG tablet TAKE 1 TABLET BY MOUTH EVERY 4 TO 6 HOURS AS NEEDED FOR NAUSEA  12/23/23   Beather Delon Gibson, PA  ondansetron  (ZOFRAN -ODT) 4 MG disintegrating tablet Take 1-2 tablets (4-8 mg total) by mouth every 6 (six) hours as needed for nausea. 02/03/24   Tina Pauletta BROCKS, MD  pantoprazole  (PROTONIX ) 40 MG tablet Take 1 tablet by mouth twice daily 06/19/23   Beather Delon Gibson, PA  Potassium Chloride  ER 20 MEQ TBCR Take 1 tablet by mouth daily. 02/17/24   [provider]  prazosin  (MINIPRESS ) 1 MG capsule Take 1 mg by mouth at bedtime. 05/17/22   [provider]  predniSONE  (DELTASONE ) 50 MG tablet Take 50 mg 13 hours prior to exam, take 50 mg 7 hours prior to exam, take 50 mg one hour prior to exam 02/04/24   Tina Pauletta BROCKS, MD  rosuvastatin  (CRESTOR ) 20 MG tablet Take 1 tablet by mouth once daily 11/08/23   Loistine Sober, NP  sertraline  (ZOLOFT ) 50 MG tablet Take 50 mg by mouth 2 (two) times daily. 10/17/21   [provider]  traZODone  (DESYREL ) 100 MG tablet Take 100 mg by mouth at bedtime. 07/24/16   [provider]  valsartan  (DIOVAN ) 320 MG tablet Take 1 tablet by mouth once daily 09/24/23   Croitoru, Mihai, MD    Allergies: Contrast media [iodinated contrast media]    Review of Systems  Respiratory:  Negative for cough and shortness of breath.   Cardiovascular:  Negative for chest pain.    Updated Vital Signs BP (!) 162/99 (BP Location: Left Arm)   Pulse 60   Temp 99.3 F (37.4 C) (Oral)   Resp (!) 29   SpO2 96%   Physical Exam Vitals and nursing note reviewed.  Constitutional:      General: He is not in acute distress.    Appearance: Normal appearance.  HENT:     Head: Normocephalic and atraumatic.  Eyes:     Conjunctiva/sclera: Conjunctivae normal.  Cardiovascular:     Rate and Rhythm: Bradycardia present.  Pulmonary:     Effort: Pulmonary effort is normal. No respiratory distress.     Breath sounds: Normal breath sounds.  Abdominal:     General: Bowel sounds are normal. There is no distension.      Palpations: Abdomen is soft.     Tenderness: There is no abdominal tenderness. There is no guarding or rebound.  Musculoskeletal:     Right lower leg: No edema.     Left lower leg: No edema.  Skin:    Coloration: Skin is not jaundiced or pale.  Neurological:     Mental Status: He is alert and oriented to person, place, and time. Mental status is at baseline.     (all labs ordered are listed, but only abnormal results are displayed) Labs Reviewed  BASIC METABOLIC PANEL WITH GFR - Abnormal; Notable  for the following components:      Result Value   Potassium 3.4 (*)    CO2 21 (*)    Glucose, Bld 147 (*)    BUN 26 (*)    Creatinine, Ser 3.17 (*)    Calcium  7.9 (*)    GFR, Estimated 20 (*)    All other components within normal limits  CBC - Abnormal; Notable for the following components:   WBC 3.5 (*)    RBC 2.88 (*)    Hemoglobin 8.5 (*)    HCT 26.1 (*)    All other components within normal limits  URINALYSIS, ROUTINE W REFLEX MICROSCOPIC - Abnormal; Notable for the following components:   Glucose, UA 50 (*)    Protein, ur >=300 (*)    All other components within normal limits  PRO BRAIN NATRIURETIC PEPTIDE - Abnormal; Notable for the following components:   Pro Brain Natriuretic Peptide 10,927.0 (*)    All other components within normal limits  HEPATIC FUNCTION PANEL - Abnormal; Notable for the following components:   Total Protein 5.7 (*)    Albumin  2.9 (*)    Indirect Bilirubin 0.2 (*)    All other components within normal limits  CBG MONITORING, ED - Abnormal; Notable for the following components:   Glucose-Capillary 50 (*)    All other components within normal limits  CBG MONITORING, ED - Abnormal; Notable for the following components:   Glucose-Capillary 135 (*)    All other components within normal limits  CBG MONITORING, ED - Abnormal; Notable for the following components:   Glucose-Capillary 124 (*)    All other components within normal limits  CBG MONITORING,  ED - Abnormal; Notable for the following components:   Glucose-Capillary 161 (*)    All other components within normal limits  TROPONIN T, HIGH SENSITIVITY - Abnormal; Notable for the following components:   Troponin T High Sensitivity 73 (*)    All other components within normal limits  TROPONIN T, HIGH SENSITIVITY - Abnormal; Notable for the following components:   Troponin T High Sensitivity 69 (*)    All other components within normal limits  CULTURE, BLOOD (ROUTINE X 2)  CULTURE, BLOOD (ROUTINE X 2)  TSH  MAGNESIUM  CBG MONITORING, ED  I-STAT CG4 LACTIC ACID, ED  I-STAT CG4 LACTIC ACID, ED  CBG MONITORING, ED    EKG: EKG Interpretation Date/Time:  Friday April 17 2024 16:07:11 EST Ventricular Rate:  45 PR Interval:  303 QRS Duration:  113 QT Interval:  599 QTC Calculation: 519 R Axis:   86  Text Interpretation: Sinus bradycardia Prolonged PR interval Probable left ventricular hypertrophy Borderline T abnormalities, lateral leads Prolonged QT interval Confirmed by Lenor Hollering [phone]) on 04/17/2024 5:20:21 PM  Radiology: DG Chest Portable 1 View Result Date: 04/17/2024 CLINICAL DATA:  Hypoxia, hypoglycemia EXAM: PORTABLE CHEST 1 VIEW COMPARISON:  03/21/2023 FINDINGS: Single frontal view of the chest demonstrates an enlarged cardiac silhouette. No acute airspace disease, effusion, or pneumothorax. No acute bony abnormality. IMPRESSION: 1. Enlarged cardiac silhouette. 2. No acute airspace disease. Electronically Signed   By: Ozell Daring M.D.   On: 04/17/2024 16:34     .Critical Care  Performed by: Minnie Tinnie BRAVO, PA Authorized by: Minnie Tinnie BRAVO, PA   Critical care provider statement:    Critical care time (minutes):  35   Critical care was necessary to treat or prevent imminent or life-threatening deterioration of the following conditions:  Metabolic crisis   Critical care was  time spent personally by me on the following activities:  Development of treatment plan  with patient or surrogate, discussions with consultants, evaluation of patient's response to treatment, examination of patient, ordering and review of laboratory studies, ordering and review of radiographic studies, ordering and performing treatments and interventions, pulse oximetry, re-evaluation of patient's condition and review of old charts   Care discussed with: admitting provider      Medications Ordered in the ED  0.9 %  sodium chloride  infusion (has no administration in time range)  dextrose  50 % solution 50 mL (50 mLs Intravenous Given 04/17/24 1605)   Clinical Course as of 04/18/24 0030  Sat Apr 18, 2024  0002 Calcium (!): 7.9 Corrected calcium  8.8 in setting of hypoalbuminemia  [LB]    Clinical Course User Index [LB] Minnie Tinnie BRAVO, PA                                 Medical Decision Making Amount and/or Complexity of Data Reviewed Labs: ordered. Decision-making details documented in ED Course. Radiology: ordered.  Risk Prescription drug management. Decision regarding hospitalization.   Patient presents to the ED for concern of hypoxia, hypoglycemia, hypothermia, this involves an extensive number of treatment options, and is a complaint that carries with it a high risk of complications and morbidity.  The differential diagnosis includes thyroid  crisis, overuse of insulin , infection, pneumonia, sepsis, electrolyte abnormality   Co morbidities that complicate the patient evaluation  T1DM See HPI   Additional history obtained:  Additional history obtained from EMS, Family, and Nursing   External records from outside source obtained and reviewed including EMS, triage RN note   Lab Tests:  I Ordered, and personally interpreted labs.  The pertinent results include:   CBG 147 Creatinine 3.17   Imaging Studies ordered:  I ordered imaging studies including CXR  I independently visualized and interpreted imaging which showed   Enlarged cardiac silhouette. No  acute airspace disease I agree with the radiologist interpretation   Cardiac Monitoring:  The patient was maintained on a cardiac monitor.  I personally viewed and interpreted the cardiac monitored which showed an underlying rhythm of: Sinus bradycardia.  Prolonged QT, prolonged PR interval but no ischemic changes   Medicines ordered and prescription drug management:  I ordered medication including D50 for hypoglycemia Reevaluation of the patient after these medicines showed that the patient resolved I have reviewed the patients home medicines and have made adjustments as needed    Critical Interventions:  Hypoglycemia requiring frequent CBG checks, D50   Consultations Obtained:  I requested consultation with the hospitalist,  and discussed lab and imaging findings as well as pertinent plan - Dr. Franky accepts pt for admission   Problem List / ED Course:  Hypoglycemia Vital signs notable for bradycardia of 44 bpm, temperature of 92.8 F rectal.  No hypotension Insulin -dependent T1DM No AMS through entirety of ED stay CBG initially 50 by EMS.  Provided D10 x 2 by EMS.  Upon arrival, was CBG of 50.  I provided a D50 ampule which increased to 135 following.  Was provided a meal.  No vomiting.  CBG continued to remain WNL.  Most recent 161 at 0000 Hypoglycemia likely secondary to incorrect reading on his manual monitor and incorrect dose of short acting insulin   Hypothermia 92 degrees rectally Placed under [employer]  Will obtain TSH, lactic, cultures Following Bair hugger, patient's imaging increased to 74F rectal.  HR improved to 59bpm. TSH WNL No current signs of sepsis.  No leukocytosis nor lactic acidosis.  BC pending  Hypoxia Troponin downtrending.  Was 73 down to 69. BNP 10,927 with trace edema BLE.  No effusion on chest x-ray.  Last echo 2023 LVEF 60 to 65%.  No history of HF.  No fluid pills on file Has not been on oxygen throughout entirety of ED stay.  No  signs of respiratory distress Patient has no complaints of chest pain, shortness of breath, cough Chest x-ray without pneumonia nor fluid overload No hx of DVT/PE. No AC. Hypoxia may have been secondary to significant hyperglycemia causing patient to have decreased respirations but has been maintaining sats without supplementation throughout entirety of   Reevaluation:  After the interventions noted above, I reevaluated the patient and found that they have :improved    Dispostion:  After consideration of the diagnostic results and the patients response to treatment, I feel that the patent would benefit from admission for hypoglycemia, hypothermia, off.   Discussed ED workup, dispo patient expressed understanding to plan.  All questions answered satisfaction.  Final diagnoses:  Hypoglycemia  Hypothermia, initial encounter    ED Discharge Orders     None        Minnie Tinnie BRAVO, [place] 04/18/24 0034  "

## 2024-04-17 NOTE — ED Triage Notes (Signed)
 BIB EMS from home. Wife reports out running errands this morning.  Pt has not eaten today.  Found later at home with CBG 31.  Fire noted on arrival O2 sats were low and had to place on NRB  On 4L [place] since then with EMS.  EMS gave 10 gram D10 and sugar rose to 71 then back down to 58.  On arrival EMS gave another 10 g D10 and upon triage CBG was 81.  Pt cold to touch, lethargic.  Wife reported dexcom has not been functioning

## 2024-04-18 ENCOUNTER — Observation Stay (HOSPITAL_COMMUNITY)

## 2024-04-18 DIAGNOSIS — E162 Hypoglycemia, unspecified: Principal | ICD-10-CM

## 2024-04-18 LAB — CBC
HCT: 24 % — ABNORMAL LOW (ref 39.0–52.0)
Hemoglobin: 8.1 g/dL — ABNORMAL LOW (ref 13.0–17.0)
MCH: 29.9 pg (ref 26.0–34.0)
MCHC: 33.8 g/dL (ref 30.0–36.0)
MCV: 88.6 fL (ref 80.0–100.0)
Platelets: 177 K/uL (ref 150–400)
RBC: 2.71 MIL/uL — ABNORMAL LOW (ref 4.22–5.81)
RDW: 13.4 % (ref 11.5–15.5)
WBC: 6.3 K/uL (ref 4.0–10.5)
nRBC: 0 % (ref 0.0–0.2)

## 2024-04-18 LAB — CBG MONITORING, ED
Glucose-Capillary: 145 mg/dL — ABNORMAL HIGH (ref 70–99)
Glucose-Capillary: 155 mg/dL — ABNORMAL HIGH (ref 70–99)
Glucose-Capillary: 161 mg/dL — ABNORMAL HIGH (ref 70–99)

## 2024-04-18 LAB — COMPREHENSIVE METABOLIC PANEL WITH GFR
ALT: <5 U/L (ref 0–44)
AST: 17 U/L (ref 15–41)
Albumin: 3 g/dL — ABNORMAL LOW (ref 3.5–5.0)
Alkaline Phosphatase: 50 U/L (ref 38–126)
Anion gap: 11 (ref 5–15)
BUN: 21 mg/dL (ref 8–23)
CO2: 21 mmol/L — ABNORMAL LOW (ref 22–32)
Calcium: 7.7 mg/dL — ABNORMAL LOW (ref 8.9–10.3)
Chloride: 106 mmol/L (ref 98–111)
Creatinine, Ser: 2.78 mg/dL — ABNORMAL HIGH (ref 0.61–1.24)
GFR, Estimated: 24 mL/min — ABNORMAL LOW
Glucose, Bld: 154 mg/dL — ABNORMAL HIGH (ref 70–99)
Potassium: 3.7 mmol/L (ref 3.5–5.1)
Sodium: 138 mmol/L (ref 135–145)
Total Bilirubin: 0.5 mg/dL (ref 0.0–1.2)
Total Protein: 5.8 g/dL — ABNORMAL LOW (ref 6.5–8.1)

## 2024-04-18 LAB — PHOSPHORUS: Phosphorus: 3.1 mg/dL (ref 2.5–4.6)

## 2024-04-18 MED ORDER — AMLODIPINE BESYLATE 5 MG PO TABS
10.0000 mg | ORAL_TABLET | Freq: Every day | ORAL | Status: DC
  Administered 2024-04-18: 10 mg via ORAL
  Filled 2024-04-18: qty 2

## 2024-04-18 MED ORDER — DEXTROSE 50 % IV SOLN: 25.0000 g | INTRAVENOUS | Status: DC | PRN

## 2024-04-18 MED ORDER — ONDANSETRON HCL 4 MG/2ML IJ SOLN
4.0000 mg | Freq: Four times a day (QID) | INTRAMUSCULAR | Status: DC | PRN
  Administered 2024-04-18: 4 mg via INTRAVENOUS
  Filled 2024-04-18: qty 2

## 2024-04-18 MED ORDER — LOPERAMIDE HCL 2 MG PO CAPS
2.0000 mg | ORAL_CAPSULE | Freq: Every day | ORAL | Status: DC
  Administered 2024-04-18: 2 mg via ORAL
  Filled 2024-04-18: qty 1

## 2024-04-18 MED ORDER — HYDRALAZINE HCL 50 MG PO TABS
100.0000 mg | ORAL_TABLET | Freq: Three times a day (TID) | ORAL | Status: DC
  Filled 2024-04-18 (×2): qty 2

## 2024-04-18 MED ORDER — ACETAMINOPHEN 325 MG PO TABS: 650.0000 mg | ORAL_TABLET | Freq: Four times a day (QID) | ORAL | Status: DC | PRN

## 2024-04-18 MED ORDER — PANTOPRAZOLE SODIUM 40 MG PO TBEC
40.0000 mg | DELAYED_RELEASE_TABLET | Freq: Two times a day (BID) | ORAL | Status: DC
  Administered 2024-04-18: 40 mg via ORAL
  Filled 2024-04-18: qty 1

## 2024-04-18 MED ORDER — ROSUVASTATIN CALCIUM 20 MG PO TABS
20.0000 mg | ORAL_TABLET | Freq: Every day | ORAL | Status: DC
  Administered 2024-04-18: 20 mg via ORAL
  Filled 2024-04-18: qty 1

## 2024-04-18 MED ORDER — ACETAMINOPHEN 650 MG RE SUPP: 650.0000 mg | Freq: Four times a day (QID) | RECTAL | Status: DC | PRN

## 2024-04-18 MED ORDER — TRAZODONE HCL 100 MG PO TABS: 100.0000 mg | ORAL_TABLET | Freq: Every day | ORAL | Status: DC

## 2024-04-18 MED ORDER — MONTELUKAST SODIUM 10 MG PO TABS: 10.0000 mg | ORAL_TABLET | Freq: Every day | ORAL | Status: DC

## 2024-04-18 MED ORDER — ONDANSETRON HCL 4 MG PO TABS: 4.0000 mg | ORAL_TABLET | Freq: Four times a day (QID) | ORAL | Status: DC | PRN

## 2024-04-18 MED ORDER — SERTRALINE HCL 50 MG PO TABS
200.0000 mg | ORAL_TABLET | Freq: Every day | ORAL | Status: DC
  Administered 2024-04-18: 200 mg via ORAL
  Filled 2024-04-18: qty 4

## 2024-04-18 MED ORDER — CALCIUM CARBONATE 1250 (500 CA) MG PO TABS
1.0000 | ORAL_TABLET | Freq: Two times a day (BID) | ORAL | Status: DC
  Administered 2024-04-18: 1250 mg via ORAL
  Filled 2024-04-18: qty 1

## 2024-04-18 MED ORDER — NEBIVOLOL HCL 10 MG PO TABS
10.0000 mg | ORAL_TABLET | Freq: Every day | ORAL | Status: DC
  Filled 2024-04-18: qty 1

## 2024-04-18 MED ORDER — HYDRALAZINE HCL 20 MG/ML IJ SOLN
10.0000 mg | INTRAMUSCULAR | Status: DC | PRN
  Filled 2024-04-18: qty 1

## 2024-04-18 MED ORDER — PRAZOSIN HCL 1 MG PO CAPS: 1.0000 mg | ORAL_CAPSULE | Freq: Every day | ORAL | Status: DC

## 2024-04-18 MED ORDER — FAMOTIDINE 20 MG PO TABS: 20.0000 mg | ORAL_TABLET | Freq: Every day | ORAL | Status: DC

## 2024-04-18 NOTE — ED Notes (Signed)
 Patient requested to leave AMA to take girlfriend to hospital in [place].  Walked in room to find patient standing naked, IV on floor.  Patient had just received medications and refuses further treatment.  Admitting MD aware and ER charge aware.  [place] signature on computer failed to work, work order for repair placed.  No AMA forms on hard copy.  Received verbal permission from patient to sign for him and ER charge is aware.    Patient is aware that his oxygen level is low and he should remain in the hospital and that he could suffer consequences from leaving on his own without care.  He verbalized understanding.

## 2024-04-18 NOTE — ED Notes (Signed)
 Patient has reflux and nausea normal for him in the am.  Zofran  given.  Patient bed repositioned and call light placed within reach.  Denies other needs.  Patient did express desire to go home due to medical needs of significant other who has doctor appointment today in [place].

## 2024-04-18 NOTE — Progress Notes (Signed)
 " Progress Note  The process of admission was begun on this case, but the patient has some urgent personal matters to take care of, was feeling better and left AMA before allowing me to examine him.  He understands the risks of making this decision.  Below is a summary of the encounter.  Patient: Jesse Lane FMW:[id number] DOB: 1955/10/22 DOA: 04/17/2024 DOS: the patient was seen and examined on 04/18/2024 PCP: Jesse Lane CROME, MD  Patient coming from: Home  Chief Complaint:  Chief Complaint  Patient presents with   Hypoglycemia   HPI: Jesse Lane is a 69 y.o. male with medical history significant of seasonal allergies, anemia, cataracts, chronic diastolic heart failure, CAD, history of MI, hypertension, history of CVA, hyperlipidemia, type 2 diabetes, diabetic neuropathy, diabetic gastroparesis, GERD, Barret's esophagus, gastric PUD, normocytic anemia, history of chronic diarrhea, melanoma metastatic to lymph nodes and intestine, MGUS, osteoporosis, history of severe protein calorie malnutrition who was brought to the emergency department after he was found unresponsive at home with a CBG of 31 mg/dL and hypoxia of 19%.  EMS gave him 50% dextrose  and put him on NRB oxygen mask.  He was hypothermic on arrival to the emergency department.   Lab work: Urinalysis showed glucose of 50 and protein greater than 300 mg/dL but was otherwise unremarkable.  CBC showed a white count of 3.5, hemoglobin 8.5 g/dL and platelets 813.  Lactic acid was normal.  Troponin was 73 then 69 ng/L and proBNP was 10,927 pg/mL.  BMP showed sodium 140, potassium 3.4, chloride 109 and CO2 21 mmol/L with an anion gap of 11.  Glucose 147, BUN 26, creatinine 3.17 (over the last 18 months his creatinine has ranged from 1.4 to 3.5 mg/dL) and corrected calcium  8.8 mg/dL.  Total protein 5.7 and albumin  2.9 g/dL, the rest of the LFTs are unremarkable.  Magnesium is 1.9 mg/dL.  ED course: Initial vital signs were temperature 92.8  F, pulse 47, respirations 16, BP 142/69 mmHg and O2 sat 88% on room air.  The patient was given dextrose  50% 50 mm IVP and was placed on a Lawyer.   Review of Systems: As mentioned in the history of present illness. All other systems reviewed and are negative. Past Medical History:  Diagnosis Date   Allergy    Anemia    Arthritis    Cataract    CHF (congestive heart failure) (HCC)    Diabetes mellitus type 2, uncontrolled    Diabetic neuropathy (HCC)    GERD (gastroesophageal reflux disease)    History of stomach ulcers    Hyperlipidemia    Hypertension    Melanoma (HCC)    mets to lymph node and intestines   Myocardial infarction (HCC) 2023   Neuromuscular disorder (HCC)    Osteoporosis    Past Surgical History:  Procedure Laterality Date   ABCESS DRAINAGE     gluteal area   COLONOSCOPY     INGUINAL HERNIA REPAIR N/A 08/15/2015   Procedure: HERNIA REPAIR INGUINAL INCARCERATED;  Surgeon: Jesse Angle, MD;  Location: [facility];  Service: General;  Laterality: N/A;  with MESH   pyloric stenosis     TONSILLECTOMY     Social History:  reports that he quit smoking about 28 years ago. His smoking use included cigarettes. He started smoking about 36 years ago. He has a 8 pack-year smoking history. He has never used smokeless tobacco. He reports that he does not currently use alcohol  after a past usage  of about 4.0 standard drinks of alcohol  per week. He reports that he does not currently use drugs after having used the following drugs: Marijuana. Frequency: 7.00 times per week.  [Allergies]  [Allergies] Allergen Reactions   Contrast Media [Iodinated Contrast Media] Itching    Family History  Problem Relation Age of Onset   Breast cancer Mother    Bone cancer Mother    Melanoma Mother    Prostate cancer Father    Diabetes Paternal Aunt    Colon polyps Maternal Grandfather    Colon cancer Maternal Grandfather    Pancreatic cancer Maternal Grandfather    Esophageal cancer  Neg Hx    Stomach cancer Neg Hx    Rectal cancer Neg Hx     Prior to Admission medications  Medication Sig Start Date End Date Taking? Authorizing Provider  acetaminophen  (TYLENOL ) 500 MG tablet Take 1,000 mg by mouth every 6 (six) hours as needed for mild pain.   Yes [provider]  amLODipine  (NORVASC ) 10 MG tablet Take 1 tablet (10 mg total) by mouth daily. 10/23/23  Yes Croitoru, Mihai, MD  calcium  carbonate (OS-CAL - DOSED IN MG OF ELEMENTAL CALCIUM ) 1250 (500 Ca) MG tablet Take 1 tablet (1,250 mg total) by mouth 2 (two) times daily with a meal. 03/23/23  Yes Pokhrel, Laxman, MD  famotidine  (PEPCID ) 40 MG tablet TAKE 1 TABLET BY MOUTH AT BEDTIME 03/25/24  Yes Lemmon, Delon Gibson, PA  hydrALAZINE  (APRESOLINE ) 100 MG tablet Take 100 mg by mouth 3 (three) times daily. 03/22/24  Yes [provider]  insulin  degludec (TRESIBA  FLEXTOUCH) 100 UNIT/ML FlexTouch Pen Inject 18 Units into the skin daily before breakfast.   Yes [provider]  insulin  lispro (HUMALOG) 100 UNIT/ML KwikPen Inject 5 Units into the skin 3 (three) times daily. Per sliding scale   Yes [provider]  Insulin  Pen Needle (BD PEN NEEDLE MICRO U/F) 32G X 6 MM MISC 1 each by Other route See admin instructions. Use one pen needle to inject insulin  4 times daily. **must have appointment for additional refills.** 06/30/18  Yes Jesse Mallick, MD  Insulin  Pen Needle (BD PEN NEEDLE MICRO U/F) 32G X 6 MM MISC USE 1 PEN NEEDLE SIX TIMES DAILY 07/02/18  Yes Jesse Mallick, MD  KERENDIA 20 MG TABS Take 1 tablet by mouth daily. 03/14/24  Yes [provider]  loperamide  (IMODIUM ) 2 MG capsule Take 1 capsule by mouth once daily 04/10/24  Yes Lemmon, Delon Gibson, PA  montelukast  (SINGULAIR ) 10 MG tablet Take 10 mg by mouth at bedtime. 10/13/19  Yes [provider]  nebivolol  (BYSTOLIC ) 10 MG tablet Take 1 tablet (10 mg total) by mouth daily. 12/03/23  Yes Croitoru, Mihai, MD  ondansetron   (ZOFRAN ) 4 MG tablet TAKE 1 TABLET BY MOUTH EVERY 4 TO 6 HOURS AS NEEDED FOR NAUSEA 12/23/23  Yes Beather Delon Gibson, PA  ondansetron  (ZOFRAN -ODT) 4 MG disintegrating tablet Take 1-2 tablets (4-8 mg total) by mouth every 6 (six) hours as needed for nausea. 02/03/24  Yes Tina Pauletta BROCKS, MD  pantoprazole  (PROTONIX ) 40 MG tablet Take 1 tablet by mouth twice daily 06/19/23  Yes Beather Delon Gibson, PA  Potassium Chloride  ER 20 MEQ TBCR Take 1 tablet by mouth daily. 02/17/24  Yes [provider]  prazosin  (MINIPRESS ) 1 MG capsule Take 1 mg by mouth at bedtime. 05/17/22  Yes [provider]  rosuvastatin  (CRESTOR ) 20 MG tablet Take 1 tablet by mouth once daily 11/08/23  Yes Wittenborn,  Barnie, NP  sertraline  (ZOLOFT ) 100 MG tablet Take 200 mg by mouth daily. 03/25/24  Yes [provider]  traZODone  (DESYREL ) 100 MG tablet Take 100 mg by mouth at bedtime. 07/24/16  Yes [provider]  valsartan  (DIOVAN ) 320 MG tablet Take 1 tablet by mouth once daily 09/24/23  Yes Croitoru, Mihai, MD  diphenhydrAMINE  (BENADRYL ) 50 MG tablet Take 1 tablet (50 mg total) by mouth 1 day or 1 dose for 1 dose. Patient not taking: Reported on 02/21/2024 02/04/24 02/05/24  Tina Pauletta BROCKS, MD  divalproex (DEPAKOTE ER) 250 MG 24 hr tablet Take 250 mg by mouth daily. Patient not taking: Reported on 04/18/2024 03/13/23   [provider]  hydrALAZINE  (APRESOLINE ) 50 MG tablet Take 2 tablets (100 mg total) by mouth 3 (three) times daily. Patient not taking: Reported on 04/18/2024 07/16/23   Goodrich, Callie E, PA-C  Iron-Folic Acid -Vit B12 (IRON FORMULA PO) Take by mouth daily. Patient not taking: Reported on 04/18/2024    [provider]  KERENDIA 10 MG TABS Take 1 tablet by mouth daily. Patient not taking: Reported on 04/18/2024 07/09/23   [provider]  ketorolac  (ACULAR ) 0.5 % ophthalmic solution Apply 1 drop to eye. Patient not taking: Reported on 04/18/2024    [provider]  MELATONIN PO Take 30 mg by mouth at bedtime. Patient not taking: Reported on 04/18/2024    [provider]  Multiple Vitamin (MULTIVITAMIN WITH MINERALS) TABS tablet Take 1 tablet by mouth daily. Patient not taking: Reported on 04/18/2024    [provider]  predniSONE  (DELTASONE ) 50 MG tablet Take 50 mg 13 hours prior to exam, take 50 mg 7 hours prior to exam, take 50 mg one hour prior to exam Patient not taking: Reported on 04/18/2024 02/04/24   Tina Pauletta BROCKS, MD  sertraline  (ZOLOFT ) 50 MG tablet Take 50 mg by mouth at bedtime. Patient not taking: Reported on 04/18/2024 10/17/21   [provider]    Physical Exam: Vitals:   04/18/24 0500 04/18/24 0635 04/18/24 0640 04/18/24 0651  BP: (!) 173/74 (!) 183/82    Pulse:  62 (!) 59 (!) 57  Resp: (!) 26 17 (!) 27 (!) 24  Temp:  98.8 F (37.1 C)    TempSrc:  Oral    SpO2:  90% 90% 95%     Data Reviewed:  Results are pending, will review when available.  04/13/2021 transthoracic echocardiogram report.  IMPRESSIONS :   1. Left ventricular ejection fraction, by estimation, is 60 to 65%. The  left ventricle has normal function. The left ventricle has no regional  wall motion abnormalities. There is mild left ventricular hypertrophy.  Left ventricular diastolic parameters  are indeterminate.   2. Right ventricular systolic function is normal. The right ventricular  size is normal.   3. The mitral valve is normal in structure. Trivial mitral valve  regurgitation.   4. The aortic valve is normal in structure. Aortic valve regurgitation is  not visualized.   5. The inferior vena cava is dilated in size with <50% respiratory  variability, suggesting right atrial pressure of 15 mmHg.   Comparison(s): The left ventricular function is unchanged.   EKG: Vent. rate 45 BPM PR interval 303 ms QRS duration 113 ms QT/QTcB 599/519 ms P-R-T axes 83 86 76 Sinus bradycardia Prolonged PR  interval Probable left ventricular hypertrophy Borderline T abnormalities,   Author: Alm Dorn Castor, MD 04/18/2024 7:44 AM  For on call review [email or web address].  Author: Alm Dorn Castor, MD 04/18/2024 11:37 AM  For on call review [email or web address].   This document was prepared using Dragon voice recognition software and may contain some unintended transcription errors.   "

## 2024-04-18 NOTE — Progress Notes (Signed)
 Echocardiogram Attempted 2D Echocardiogram at 10:52am, however, nurse and patient stated the patient is discharging himself by [facility]. Echo could not be performed.  Treesa Mccully N Dalary Hollar,RDCS 04/18/2024, 10:52 AM

## 2024-04-18 NOTE — ED Notes (Signed)
 Bair hugger removed.  Pt temp 99

## 2024-04-22 LAB — CULTURE, BLOOD (ROUTINE X 2)
Culture: NO GROWTH
Special Requests: ADEQUATE

## 2024-04-24 LAB — CULTURE, BLOOD (ROUTINE X 2)
Culture  Setup Time: NO GROWTH
Special Requests: ADEQUATE

## 2024-04-25 ENCOUNTER — Telehealth (HOSPITAL_BASED_OUTPATIENT_CLINIC_OR_DEPARTMENT_OTHER): Payer: Self-pay | Admitting: *Deleted

## 2024-04-25 NOTE — Telephone Encounter (Signed)
 Post ED Visit - Positive Culture Follow-up  Culture report reviewed by antimicrobial stewardship pharmacist: [facility] []  Rankin Dee, Pharm.D. []  Venetia Gully, Pharm.D., BCPS AQ-ID []  Garrel Crews, Pharm.D., BCPS []  Almarie Lunger, Pharm.D., BCPS []  [place], [address].D., BCPS, AAHIVP []  Rosaline Bihari, Pharm.D., BCPS, AAHIVP []  Vernell Meier, PharmD, BCPS []  Latanya Hint, PharmD, BCPS []  Donald Medley, PharmD, BCPS []  Rocky Bold, PharmD []  Dorothyann Alert, PharmD, BCPS []  Morene Babe, PharmD  [facility] []  Rosaline Edison, PharmD []  Romona Bliss, PharmD []  Dolphus Roller, PharmD []  Veva Seip, Rph []  Vernell Daunt) Leonce, PharmD []  Eva Allis, PharmD []  Rosaline Millet, PharmD []  Iantha Batch, PharmD []  Arvin Gauss, PharmD []  Wanda Hasting, PharmD []  Ronal Rav, PharmD []  Rocky Slade, PharmD [x]  Marget Hench, PharmD   Positive blood culture Likely contaminant and no further patient follow-up is required at this time.  Albino Alan Novak 04/25/2024, 11:02 AM

## 2024-05-01 ENCOUNTER — Ambulatory Visit: Admitting: Gastroenterology

## 2024-05-11 ENCOUNTER — Other Ambulatory Visit: Payer: Self-pay | Admitting: Physician Assistant

## 2024-05-14 NOTE — Progress Notes (Unsigned)
 "  Chief Complaint: Discuss colonoscopy, vomiting  HPI:    [name] is a 69 year old Caucasian male with a past medical history significant for CVA (04/13/2021 echo with LVEF 60-65%), heart failure, malignant melanoma, diabetes, hypertension, hyperlipidemia, prior PUD, Barrett's esophagus, GERD and multiple others, assigned to Dr. Wilhelmenia, who presents to clinic today to discuss colonoscopy and his vomiting.    02/15/2020 EGD with short segment nonnodular Barrett's change and gastritis.  Repeat recommended in 3 years.    02/15/2020 colonoscopy with diverticulosis in the entire colon, 5 to-6 mm polyps in the sigmoid, transverse and ascending colon.  Pathology showed adenomatous polyps and repeat recommended in 5 years.    08/2021 CT that and pelvis without contrast showed no acute findings in the abdomen or pelvis.    03/14/2022 patient seen in clinic by Dr. Wilhelmenia at that time his symptoms were overall stable, he continued to use Loperamide  as needed for bowel habit changes.  Also Protonix  and occasionally Pepcid .  At that time discussed further evaluation for EPI as well as SIBO.  Also discussed repeat colonoscopic evaluation to ensure no microscopic/collagenous colitis.  Discussed he would be due for colon polyp surveillance in 2024.  Discussed follow-up endoscopy in 2024 for Barrett's esophagus.  Discussed celiac testing if above workup was negative.    03/19/2022 pancreatic fecal elastase was normal.  At that time recommended following up with SIBO breath testing if he had not.    11/21/2022 patient seen by cardiology for shortness of breath.  They did not think it was related to his heart failure.  He was euvolemic.  They recommended a chest x-ray, does not look like this was done.    01/09/2023 office visit with me at that time increased issues with vomiting.  At that point recommended repeat EGD given history of Barrett's esophagus as well as nausea and vomiting as well as a diagnostic  colonoscopy given chronic diarrhea to assess for microscopic colitis.  Also SIBO testing.  Continue on Pantoprazole  40 twice daily and Pepcid  40 twice daily.  Given Zofran .    04/17/2024 ER visit for hypoglycemia.  Potassium 3.4, glucose 147, creatinine 3.17, CBC with a hemoglobin of 8.5, WBC 3.5.  Sinus bradycardia with prolonged PR interval and QT interval.  Past Medical History:  Diagnosis Date   Allergy    Anemia    Arthritis    Cataract    CHF (congestive heart failure) (HCC)    Diabetes mellitus type 2, uncontrolled    Diabetic neuropathy (HCC)    GERD (gastroesophageal reflux disease)    History of stomach ulcers    Hyperlipidemia    Hypertension    Melanoma (HCC)    mets to lymph node and intestines   Myocardial infarction (HCC) 2023   Neuromuscular disorder (HCC)    Osteoporosis     Past Surgical History:  Procedure Laterality Date   ABCESS DRAINAGE     gluteal area   COLONOSCOPY     INGUINAL HERNIA REPAIR N/A 08/15/2015   Procedure: HERNIA REPAIR INGUINAL INCARCERATED;  Surgeon: Alm Angle, MD;  Location: [facility];  Service: General;  Laterality: N/A;  with MESH   pyloric stenosis     TONSILLECTOMY      Current Outpatient Medications  Medication Sig Dispense Refill   acetaminophen  (TYLENOL ) 500 MG tablet Take 1,000 mg by mouth every 6 (six) hours as needed for mild pain.     amLODipine  (NORVASC ) 10 MG tablet Take 1 tablet (10 mg total) by  mouth daily. 90 tablet 3   calcium  carbonate (OS-CAL - DOSED IN MG OF ELEMENTAL CALCIUM ) 1250 (500 Ca) MG tablet Take 1 tablet (1,250 mg total) by mouth 2 (two) times daily with a meal. 60 tablet 1   diphenhydrAMINE  (BENADRYL ) 50 MG tablet Take 1 tablet (50 mg total) by mouth 1 day or 1 dose for 1 dose. (Patient not taking: Reported on 02/21/2024) 1 tablet 0   divalproex (DEPAKOTE ER) 250 MG 24 hr tablet Take 250 mg by mouth daily. (Patient not taking: Reported on 04/18/2024)     famotidine  (PEPCID ) 40 MG tablet TAKE 1 TABLET BY MOUTH  AT BEDTIME 30 tablet 3   hydrALAZINE  (APRESOLINE ) 100 MG tablet Take 100 mg by mouth 3 (three) times daily.     hydrALAZINE  (APRESOLINE ) 50 MG tablet Take 2 tablets (100 mg total) by mouth 3 (three) times daily. (Patient not taking: Reported on 04/18/2024)     insulin  degludec (TRESIBA  FLEXTOUCH) 100 UNIT/ML FlexTouch Pen Inject 18 Units into the skin daily before breakfast.     insulin  lispro (HUMALOG) 100 UNIT/ML KwikPen Inject 5 Units into the skin 3 (three) times daily. Per sliding scale     Insulin  Pen Needle (BD PEN NEEDLE MICRO U/F) 32G X 6 MM MISC 1 each by Other route See admin instructions. Use one pen needle to inject insulin  4 times daily. **must have appointment for additional refills.** 150 each 0   Insulin  Pen Needle (BD PEN NEEDLE MICRO U/F) 32G X 6 MM MISC USE 1 PEN NEEDLE SIX TIMES DAILY 200 each 2   Iron-Folic Acid -Vit B12 (IRON FORMULA PO) Take by mouth daily. (Patient not taking: Reported on 04/18/2024)     KERENDIA 10 MG TABS Take 1 tablet by mouth daily. (Patient not taking: Reported on 04/18/2024)     KERENDIA 20 MG TABS Take 1 tablet by mouth daily.     ketorolac  (ACULAR ) 0.5 % ophthalmic solution Apply 1 drop to eye. (Patient not taking: Reported on 04/18/2024)     loperamide  (IMODIUM ) 2 MG capsule Take 1 capsule by mouth once daily 30 capsule 0   MELATONIN PO Take 30 mg by mouth at bedtime. (Patient not taking: Reported on 04/18/2024)     montelukast  (SINGULAIR ) 10 MG tablet Take 10 mg by mouth at bedtime.     Multiple Vitamin (MULTIVITAMIN WITH MINERALS) TABS tablet Take 1 tablet by mouth daily. (Patient not taking: Reported on 04/18/2024)     nebivolol  (BYSTOLIC ) 10 MG tablet Take 1 tablet (10 mg total) by mouth daily. 90 tablet 2   ondansetron  (ZOFRAN ) 4 MG tablet TAKE 1 TABLET BY MOUTH EVERY 4 TO 6 HOURS AS NEEDED FOR NAUSEA 90 tablet 0   ondansetron  (ZOFRAN -ODT) 4 MG disintegrating tablet Take 1-2 tablets (4-8 mg total) by mouth every 6 (six) hours as needed for nausea. 30  tablet 5   pantoprazole  (PROTONIX ) 40 MG tablet Take 1 tablet by mouth twice daily 60 tablet 0   Potassium Chloride  ER 20 MEQ TBCR Take 1 tablet by mouth daily.     prazosin  (MINIPRESS ) 1 MG capsule Take 1 mg by mouth at bedtime.     predniSONE  (DELTASONE ) 50 MG tablet Take 50 mg 13 hours prior to exam, take 50 mg 7 hours prior to exam, take 50 mg one hour prior to exam (Patient not taking: Reported on 04/18/2024) 3 tablet 0   rosuvastatin  (CRESTOR ) 20 MG tablet Take 1 tablet by mouth once daily 90 tablet 2   sertraline  (  ZOLOFT ) 100 MG tablet Take 200 mg by mouth daily.     sertraline  (ZOLOFT ) 50 MG tablet Take 50 mg by mouth at bedtime. (Patient not taking: Reported on 04/18/2024)     traZODone  (DESYREL ) 100 MG tablet Take 100 mg by mouth at bedtime.  0   valsartan  (DIOVAN ) 320 MG tablet Take 1 tablet by mouth once daily 90 tablet 3   No current facility-administered medications for this visit.    Allergies as of 05/15/2024 - Review Complete 04/17/2024  Allergen Reaction Noted   Contrast media [iodinated contrast media] Itching 10/12/2019    Family History  Problem Relation Age of Onset   Breast cancer Mother    Bone cancer Mother    Melanoma Mother    Prostate cancer Father    Diabetes Paternal Aunt    Colon polyps Maternal Grandfather    Colon cancer Maternal Grandfather    Pancreatic cancer Maternal Grandfather    Esophageal cancer Neg Hx    Stomach cancer Neg Hx    Rectal cancer Neg Hx     Social History   Socioeconomic History   Marital status: Significant Other    Spouse name: Romero Mae- S/O   Number of children: 1   Years of education: Not on file   Highest education level: Some college, no degree  Occupational History   Occupation: retired  Tobacco Use   Smoking status: Former    Current packs/day: 0.00    Average packs/day: 1 pack/day for 8.0 years (8.0 ttl pk-yrs)    Types: Cigarettes    Start date: 48    Quit date: 1998    Years since quitting: 28.1    Smokeless tobacco: Never  Vaping Use   Vaping status: Never Used  Substance and Sexual Activity   Alcohol  use: Not Currently    Alcohol /week: 4.0 standard drinks of alcohol     Types: 4 Glasses of wine per week    Comment: occasionally   Drug use: Not Currently    Frequency: 7.0 times per week    Types: Marijuana    Comment: daily   Sexual activity: Not on file  Other Topics Concern   Not on file  Social History Narrative   Not on file   Social Drivers of Health   Tobacco Use: Medium Risk (04/17/2024)   Patient History    Smoking Tobacco Use: Former    Smokeless Tobacco Use: Never    Passive Exposure: Not on Actuary Strain: Not on file  Food Insecurity: No Food Insecurity (02/03/2024)   Epic    Worried About Programme Researcher, Broadcasting/film/video in the Last Year: Never true    Ran Out of Food in the Last Year: Never true  Transportation Needs: No Transportation Needs (02/03/2024)   Epic    Lack of Transportation (Medical): No    Lack of Transportation (Non-Medical): No  Physical Activity: Not on file  Stress: Not on file  Social Connections: Not on file  Intimate Partner Violence: Not At Risk (02/03/2024)   Epic    Fear of Current or Ex-Partner: No    Emotionally Abused: No    Physically Abused: No    Sexually Abused: No  Depression (PHQ2-9): Low Risk (02/03/2024)   Depression (PHQ2-9)    PHQ-2 Score: 0  Alcohol  Screen: Not on file  Housing: Unknown (02/03/2024)   Epic    Unable to Pay for Housing in the Last Year: No    Number of Times Moved in  the Last Year: Not on file    Homeless in the Last Year: No  Utilities: Not At Risk (02/03/2024)   Epic    Threatened with loss of utilities: No  Health Literacy: Not on file    Review of Systems:    Constitutional: No weight loss, fever, chills, weakness or fatigue HEENT: Eyes: No change in vision               Ears, Nose, Throat:  No change in hearing or congestion Skin: No rash or itching Cardiovascular: No  chest pain, chest pressure or palpitations   Respiratory: No SOB or cough Gastrointestinal: See HPI and otherwise negative Genitourinary: No dysuria or change in urinary frequency Neurological: No headache, dizziness or syncope Musculoskeletal: No new muscle or joint pain Hematologic: No bleeding or bruising Psychiatric: No history of depression or anxiety    Physical Exam:  Vital signs: There were no vitals taken for this visit.  Constitutional:   Pleasant Caucasian male appears to be in NAD, Well developed, Well nourished, alert and cooperative Head:  Normocephalic and atraumatic. Eyes:   PEERL, EOMI. No icterus. Conjunctiva pink. Ears:  Normal auditory acuity. Neck:  Supple Throat: Oral cavity and pharynx without inflammation, swelling or lesion.  Respiratory: Respirations even and unlabored. Lungs clear to auscultation bilaterally.   No wheezes, crackles, or rhonchi.  Cardiovascular: Normal S1, S2. No MRG. Regular rate and rhythm. No peripheral edema, cyanosis or pallor.  Gastrointestinal:  Soft, nondistended, nontender. No rebound or guarding. Normal bowel sounds. No appreciable masses or hepatomegaly. Rectal:  Not performed.  Msk:  Symmetrical without gross deformities. Without edema, no deformity or joint abnormality.  Neurologic:  Alert and  oriented x4;  grossly normal neurologically.  Skin:   Dry and intact without significant lesions or rashes. Psychiatric: Oriented to person, place and time. Demonstrates good judgement and reason without abnormal affect or behaviors.  RELEVANT LABS AND IMAGING: CBC    Component Value Date/Time   WBC 6.3 04/18/2024 0839   RBC 2.71 (L) 04/18/2024 0839   HGB 8.1 (L) 04/18/2024 0839   HGB 10.4 (L) 02/03/2024 1137   HCT 24.0 (L) 04/18/2024 0839   PLT 177 04/18/2024 0839   PLT 143 (L) 02/03/2024 1137   MCV 88.6 04/18/2024 0839   MCH 29.9 04/18/2024 0839   MCHC 33.8 04/18/2024 0839   RDW 13.4 04/18/2024 0839   LYMPHSABS 1.0  02/03/2024 1137   MONOABS 0.3 02/03/2024 1137   EOSABS 0.0 02/03/2024 1137   BASOSABS 0.0 02/03/2024 1137    CMP     Component Value Date/Time   NA 138 04/18/2024 0839   NA 138 10/22/2022 1050   K 3.7 04/18/2024 0839   CL 106 04/18/2024 0839   CO2 21 (L) 04/18/2024 0839   GLUCOSE 154 (H) 04/18/2024 0839   BUN 21 04/18/2024 0839   BUN 31 (H) 10/22/2022 1050   CREATININE 2.78 (H) 04/18/2024 0839   CREATININE 2.45 (H) 02/03/2024 1137   CALCIUM  7.7 (L) 04/18/2024 0839   CALCIUM  5.8 (LL) 03/21/2023 0359   PROT 5.8 (L) 04/18/2024 0839   ALBUMIN  3.0 (L) 04/18/2024 0839   AST 17 04/18/2024 0839   AST 17 02/03/2024 1137   ALT <5 04/18/2024 0839   ALT 9 02/03/2024 1137   ALKPHOS 50 04/18/2024 0839   BILITOT 0.5 04/18/2024 0839   BILITOT 0.6 02/03/2024 1137   GFRNONAA 24 (L) 04/18/2024 0839   GFRNONAA 28 (L) 02/03/2024 1137   GFRAA >60 10/18/2019 9660  Assessment: 1. ***  Plan: 1. ***     Jesse Failing, PA-C [facility] 05/14/2024, 11:42 AM  Cc: Burney Darice CROME, MD  "

## 2024-05-15 ENCOUNTER — Ambulatory Visit: Admitting: Physician Assistant

## 2024-06-16 ENCOUNTER — Ambulatory Visit: Admitting: Physician Assistant
